# Patient Record
Sex: Female | Born: 1970
Health system: Southern US, Community
[De-identification: ages and names within clinical notes are randomized; demographics above are authoritative.]

## PROBLEM LIST (undated history)

## (undated) DIAGNOSIS — F32A Depression, unspecified: Secondary | ICD-10-CM

## (undated) DIAGNOSIS — D689 Coagulation defect, unspecified: Secondary | ICD-10-CM

## (undated) DIAGNOSIS — C801 Malignant (primary) neoplasm, unspecified: Secondary | ICD-10-CM

## (undated) DIAGNOSIS — R768 Other specified abnormal immunological findings in serum: Secondary | ICD-10-CM

## (undated) DIAGNOSIS — D219 Benign neoplasm of connective and other soft tissue, unspecified: Secondary | ICD-10-CM

## (undated) DIAGNOSIS — E785 Hyperlipidemia, unspecified: Secondary | ICD-10-CM

## (undated) DIAGNOSIS — Z923 Personal history of irradiation: Secondary | ICD-10-CM

## (undated) DIAGNOSIS — F329 Major depressive disorder, single episode, unspecified: Secondary | ICD-10-CM

## (undated) DIAGNOSIS — F4310 Post-traumatic stress disorder, unspecified: Secondary | ICD-10-CM

## (undated) DIAGNOSIS — Z8659 Personal history of other mental and behavioral disorders: Secondary | ICD-10-CM

## (undated) HISTORY — DX: Coagulation defect, unspecified: D68.9

## (undated) HISTORY — DX: Malignant (primary) neoplasm, unspecified: C80.1

## (undated) HISTORY — DX: Post-traumatic stress disorder, unspecified: F43.10

## (undated) HISTORY — DX: Benign neoplasm of connective and other soft tissue, unspecified: D21.9

## (undated) HISTORY — DX: Personal history of other mental and behavioral disorders: Z86.59

## (undated) HISTORY — DX: Depression, unspecified: F32.A

## (undated) HISTORY — DX: Major depressive disorder, single episode, unspecified: F32.9

## (undated) HISTORY — PX: OTHER SURGICAL HISTORY: SHX169

## (undated) HISTORY — DX: Hyperlipidemia, unspecified: E78.5

## (undated) HISTORY — DX: Other specified abnormal immunological findings in serum: R76.8

---
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
## 1998-08-18 ENCOUNTER — Other Ambulatory Visit: Admission: RE | Admit: 1998-08-18 | Discharge: 1998-08-18 | Payer: Self-pay | Admitting: *Deleted

## 2000-12-26 ENCOUNTER — Emergency Department (HOSPITAL_COMMUNITY): Admission: EM | Admit: 2000-12-26 | Discharge: 2000-12-27 | Payer: Self-pay | Admitting: Emergency Medicine

## 2004-08-19 ENCOUNTER — Emergency Department (HOSPITAL_COMMUNITY): Admission: EM | Admit: 2004-08-19 | Discharge: 2004-08-19 | Payer: Self-pay | Admitting: Emergency Medicine

## 2005-10-11 ENCOUNTER — Other Ambulatory Visit: Admission: RE | Admit: 2005-10-11 | Discharge: 2005-10-11 | Payer: Self-pay | Admitting: Obstetrics & Gynecology

## 2005-10-13 ENCOUNTER — Inpatient Hospital Stay (HOSPITAL_COMMUNITY): Admission: AD | Admit: 2005-10-13 | Discharge: 2005-10-13 | Payer: Self-pay | Admitting: Obstetrics and Gynecology

## 2005-10-13 IMAGING — US US OB COMP LESS 14 WK
1 series · 14 of 28 positions shown · non-contrast
Comparison: none

CLINICAL DATA: Pain with bleeding.  
 OBSTETRICAL ULTRASOUND <14 WKS:
 Number of Fetuses:  1
 Heart Rate:  147
 Presentation:  Oblique/variable
 Placental location:  Posterior
 CRL:  4.6 cm   11 w 3 d 
 Ultrasound EDC: [DATE]

 Fetal anatomy could not be evaluated due to the early gestational age.  
 MATERNAL UTERINE AND ADNEXAL FINDINGS
 Cervix not evaluated.  Normal ovaries.

[Series 1: us ob comp less 14 wk · 0.31mm/px · 14 of 37 slices shown]
[im 2/37]
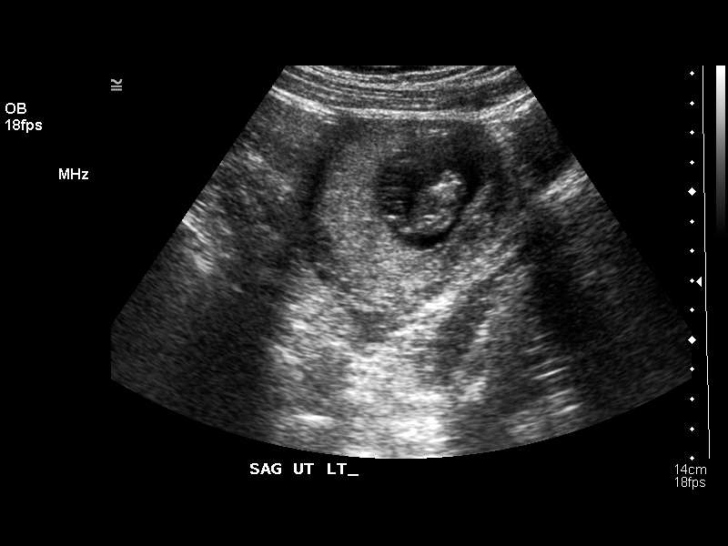
[im 5/37]
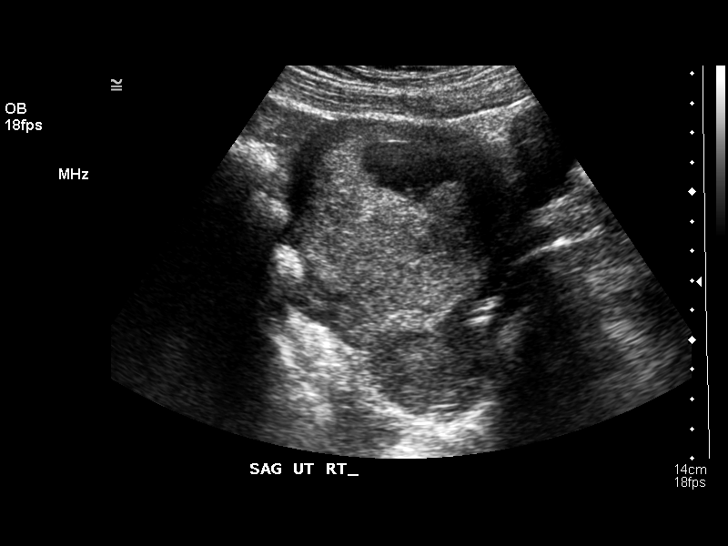
[im 7/37]
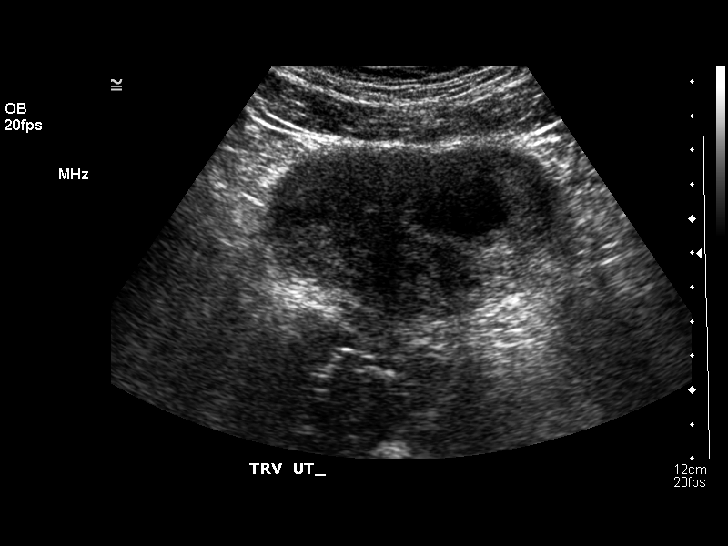
[im 10/37]
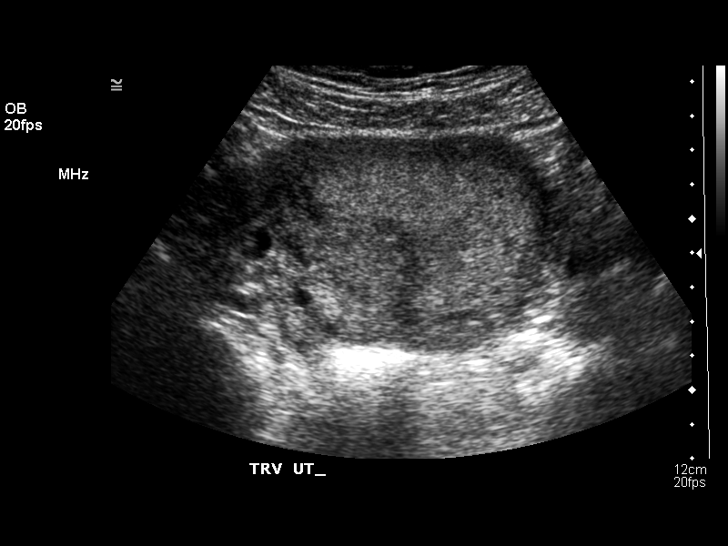
[im 13/37]
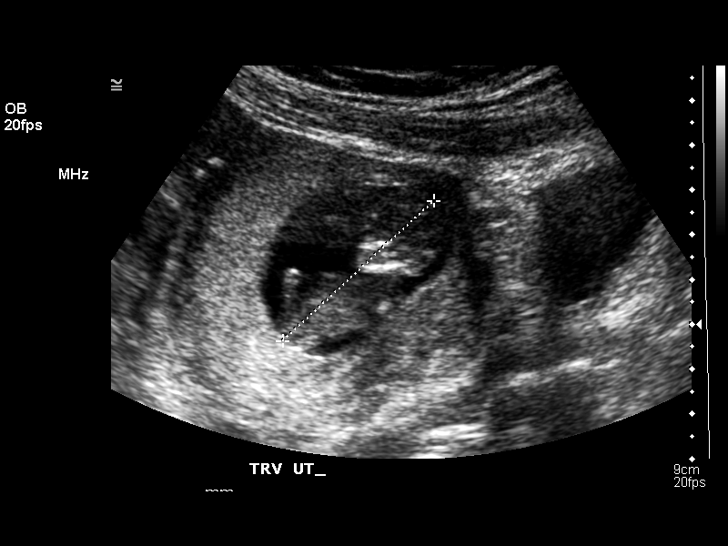
[im 15/37]
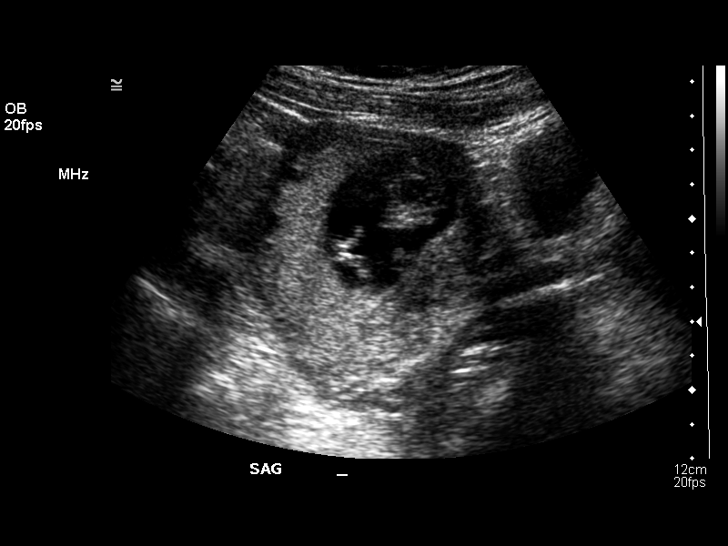
[im 18/37]
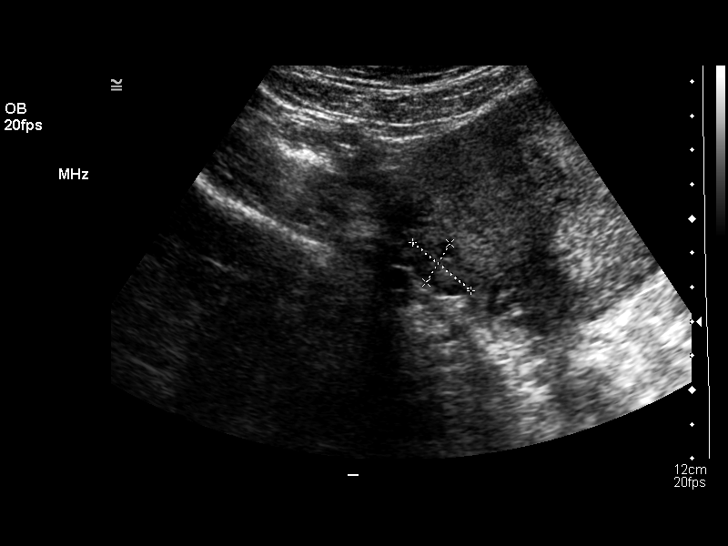
[im 21/37]
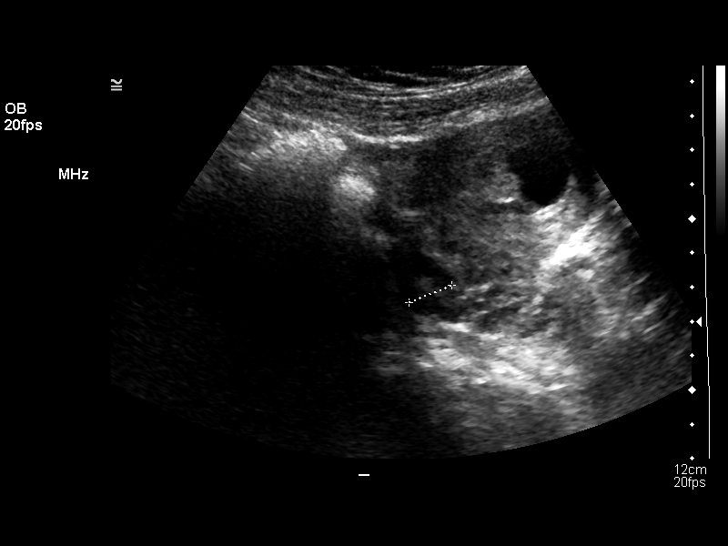
[im 23/37]
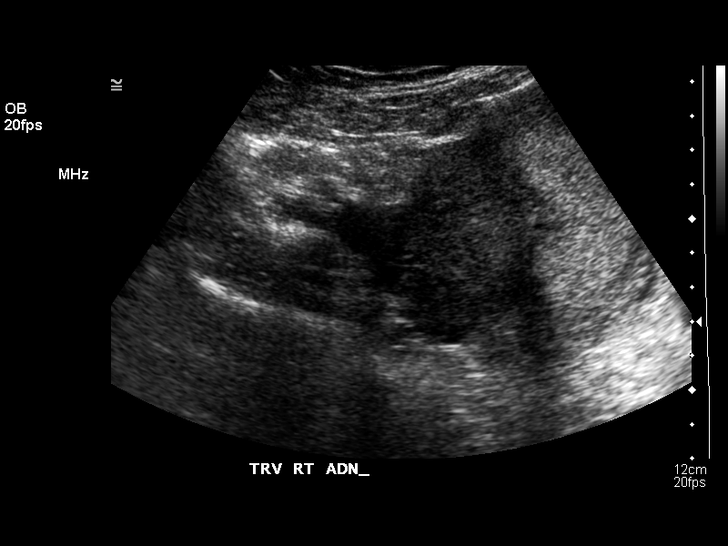
[im 26/37]
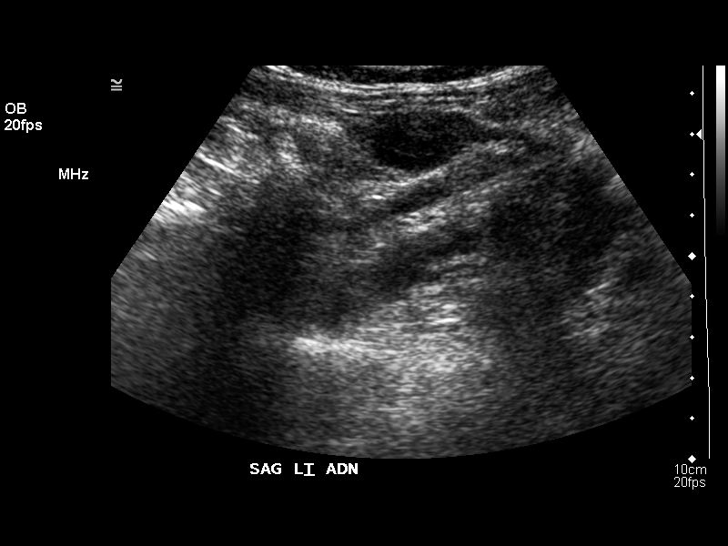
[im 29/37]
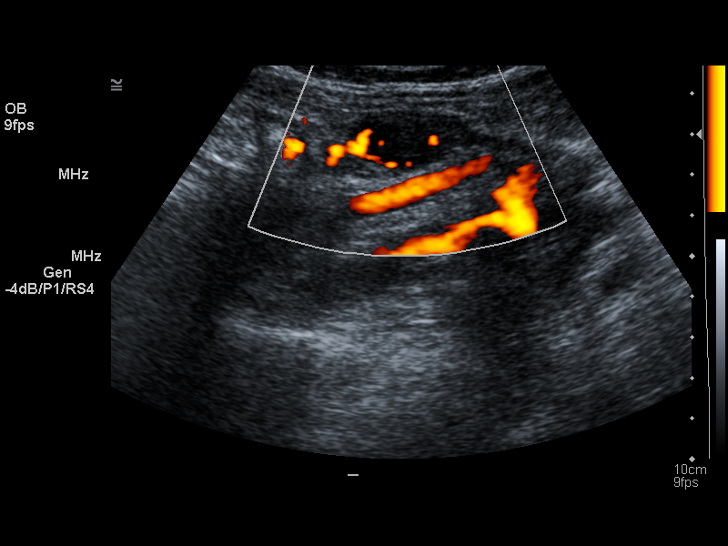
[im 31/37]
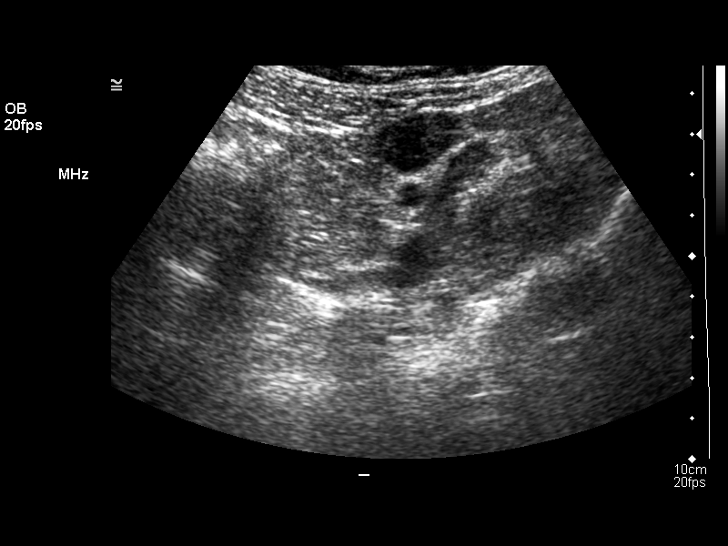
[im 34/37]
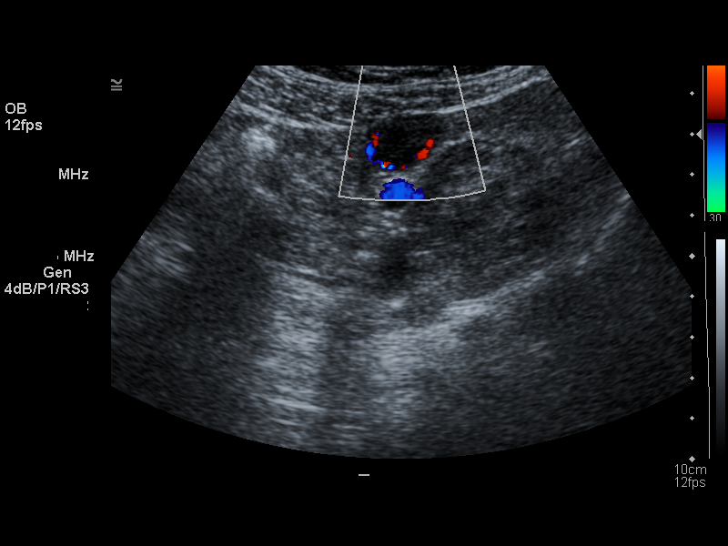
[im 37/37]
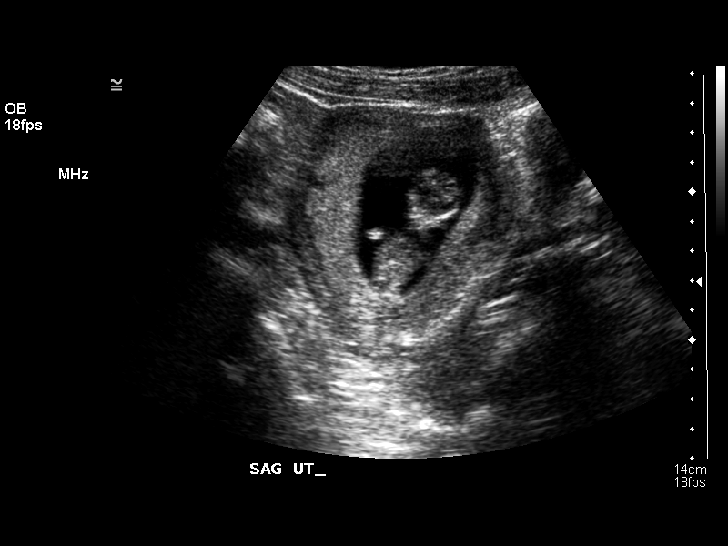

[14 of 28 positions shown; findings below may reference images not displayed]

IMPRESSION: 1.  Single living intrauterine gestation at estimated 11 week 3 day gestational age by crown rump length.  There is no evidence for placenta previa although placental abruption can be occult at ultrasound.  
 2.  Dedicated fetal anatomic assessment at 18 weeks is recommended.

## 2006-02-04 ENCOUNTER — Ambulatory Visit (HOSPITAL_COMMUNITY): Admission: RE | Admit: 2006-02-04 | Discharge: 2006-02-04 | Payer: Self-pay | Admitting: Obstetrics & Gynecology

## 2006-05-05 ENCOUNTER — Inpatient Hospital Stay (HOSPITAL_COMMUNITY): Admission: AD | Admit: 2006-05-05 | Discharge: 2006-05-10 | Payer: Self-pay | Admitting: Obstetrics & Gynecology

## 2006-05-06 ENCOUNTER — Encounter (INDEPENDENT_AMBULATORY_CARE_PROVIDER_SITE_OTHER): Payer: Self-pay | Admitting: Specialist

## 2006-05-11 ENCOUNTER — Encounter: Admission: RE | Admit: 2006-05-11 | Discharge: 2006-06-10 | Payer: Self-pay | Admitting: Obstetrics and Gynecology

## 2006-06-11 ENCOUNTER — Encounter: Admission: RE | Admit: 2006-06-11 | Discharge: 2006-07-11 | Payer: Self-pay | Admitting: Obstetrics and Gynecology

## 2006-07-12 ENCOUNTER — Encounter: Admission: RE | Admit: 2006-07-12 | Discharge: 2006-08-10 | Payer: Self-pay | Admitting: Obstetrics and Gynecology

## 2006-08-11 ENCOUNTER — Encounter: Admission: RE | Admit: 2006-08-11 | Discharge: 2006-09-10 | Payer: Self-pay | Admitting: Obstetrics and Gynecology

## 2006-09-11 ENCOUNTER — Encounter: Admission: RE | Admit: 2006-09-11 | Discharge: 2006-10-10 | Payer: Self-pay | Admitting: Obstetrics and Gynecology

## 2006-10-11 ENCOUNTER — Encounter: Admission: RE | Admit: 2006-10-11 | Discharge: 2006-11-10 | Payer: Self-pay | Admitting: Obstetrics and Gynecology

## 2006-11-11 ENCOUNTER — Encounter: Admission: RE | Admit: 2006-11-11 | Discharge: 2006-12-11 | Payer: Self-pay | Admitting: Obstetrics and Gynecology

## 2006-12-12 ENCOUNTER — Encounter: Admission: RE | Admit: 2006-12-12 | Discharge: 2007-01-09 | Payer: Self-pay | Admitting: Obstetrics and Gynecology

## 2007-01-10 ENCOUNTER — Encounter: Admission: RE | Admit: 2007-01-10 | Discharge: 2007-02-09 | Payer: Self-pay | Admitting: Obstetrics and Gynecology

## 2007-02-10 ENCOUNTER — Encounter: Admission: RE | Admit: 2007-02-10 | Discharge: 2007-03-11 | Payer: Self-pay | Admitting: Obstetrics and Gynecology

## 2007-03-12 ENCOUNTER — Encounter: Admission: RE | Admit: 2007-03-12 | Discharge: 2007-03-23 | Payer: Self-pay | Admitting: Obstetrics and Gynecology

## 2007-11-09 DIAGNOSIS — Z8659 Personal history of other mental and behavioral disorders: Secondary | ICD-10-CM

## 2007-11-09 HISTORY — DX: Personal history of other mental and behavioral disorders: Z86.59

## 2009-02-11 ENCOUNTER — Emergency Department (HOSPITAL_COMMUNITY): Admission: EM | Admit: 2009-02-11 | Discharge: 2009-02-11 | Payer: Self-pay | Admitting: Family Medicine
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
## 2009-09-24 ENCOUNTER — Emergency Department (HOSPITAL_COMMUNITY): Admission: EM | Admit: 2009-09-24 | Discharge: 2009-09-24 | Payer: Self-pay | Admitting: Emergency Medicine

## 2009-09-24 IMAGING — CR DG ANKLE COMPLETE 3+V*L*
3 series · 3 of 3 positions shown · non-contrast
Comparison: None.

CLINICAL DATA: Fall on left ankle pain

LEFT ANKLE COMPLETE - 3+ VIEW

[t ankle joint ap left]
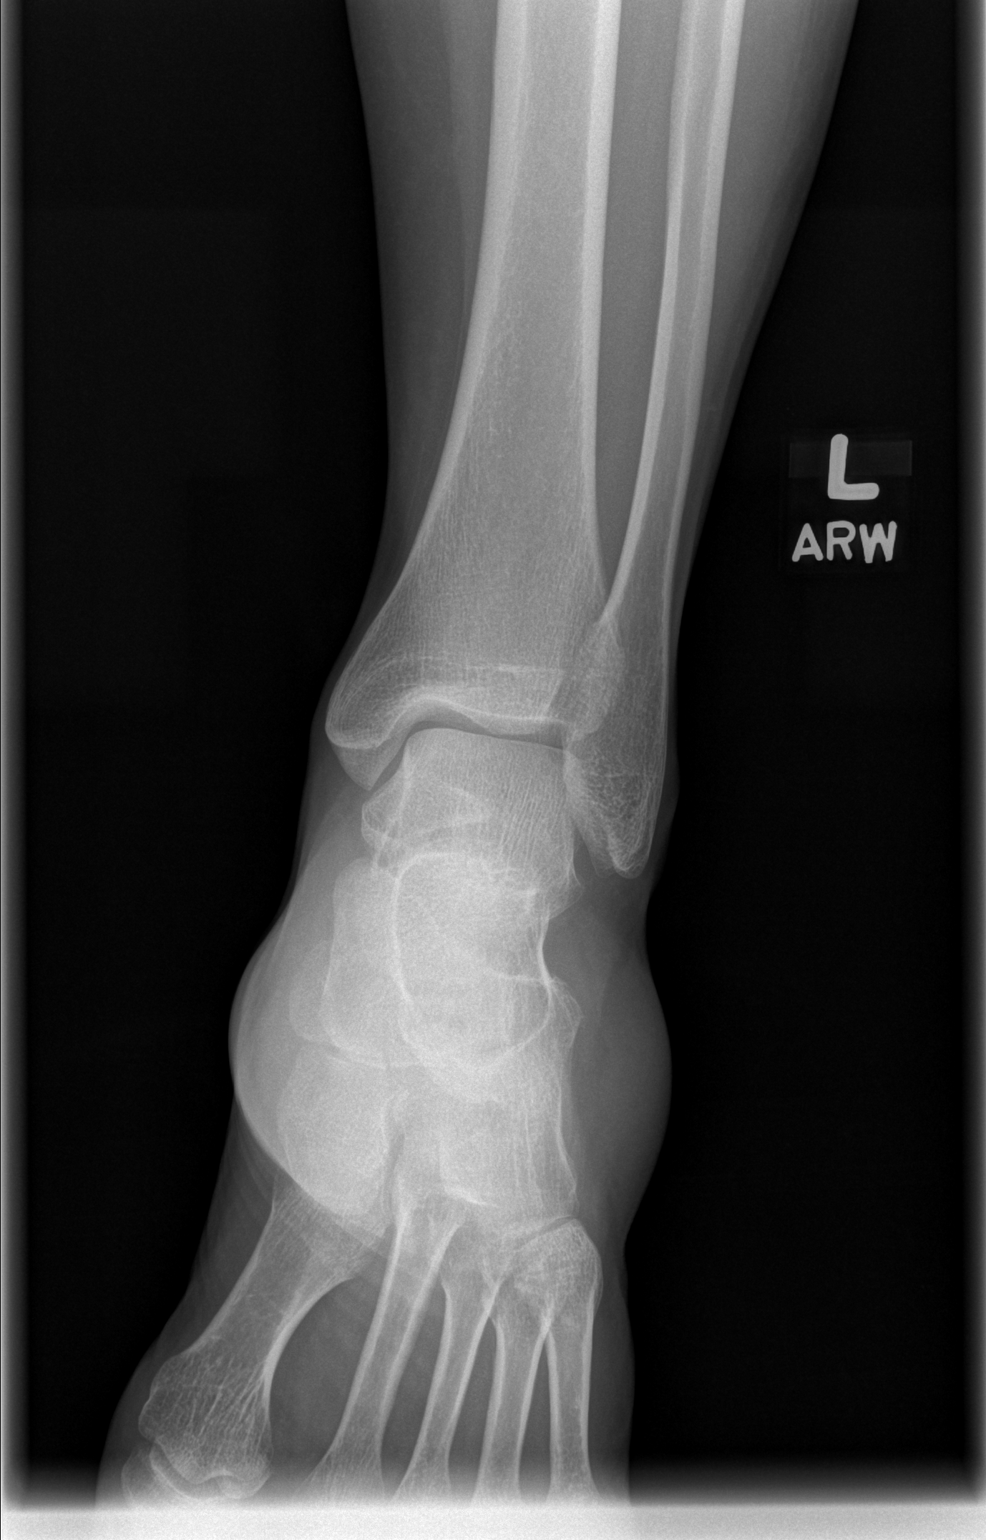

[t ankle joint oblique left]
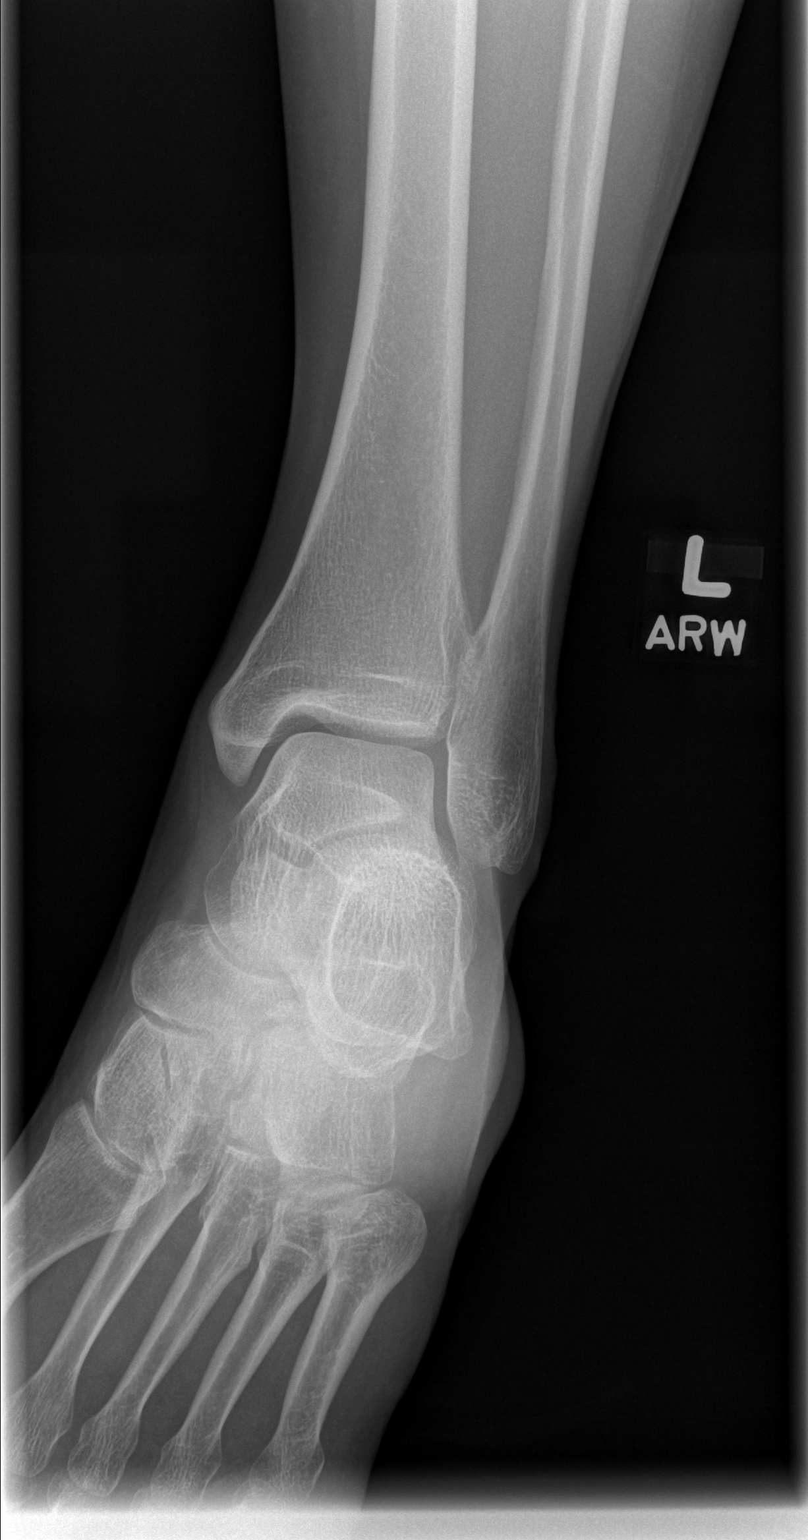

[t ankle joint lat left]
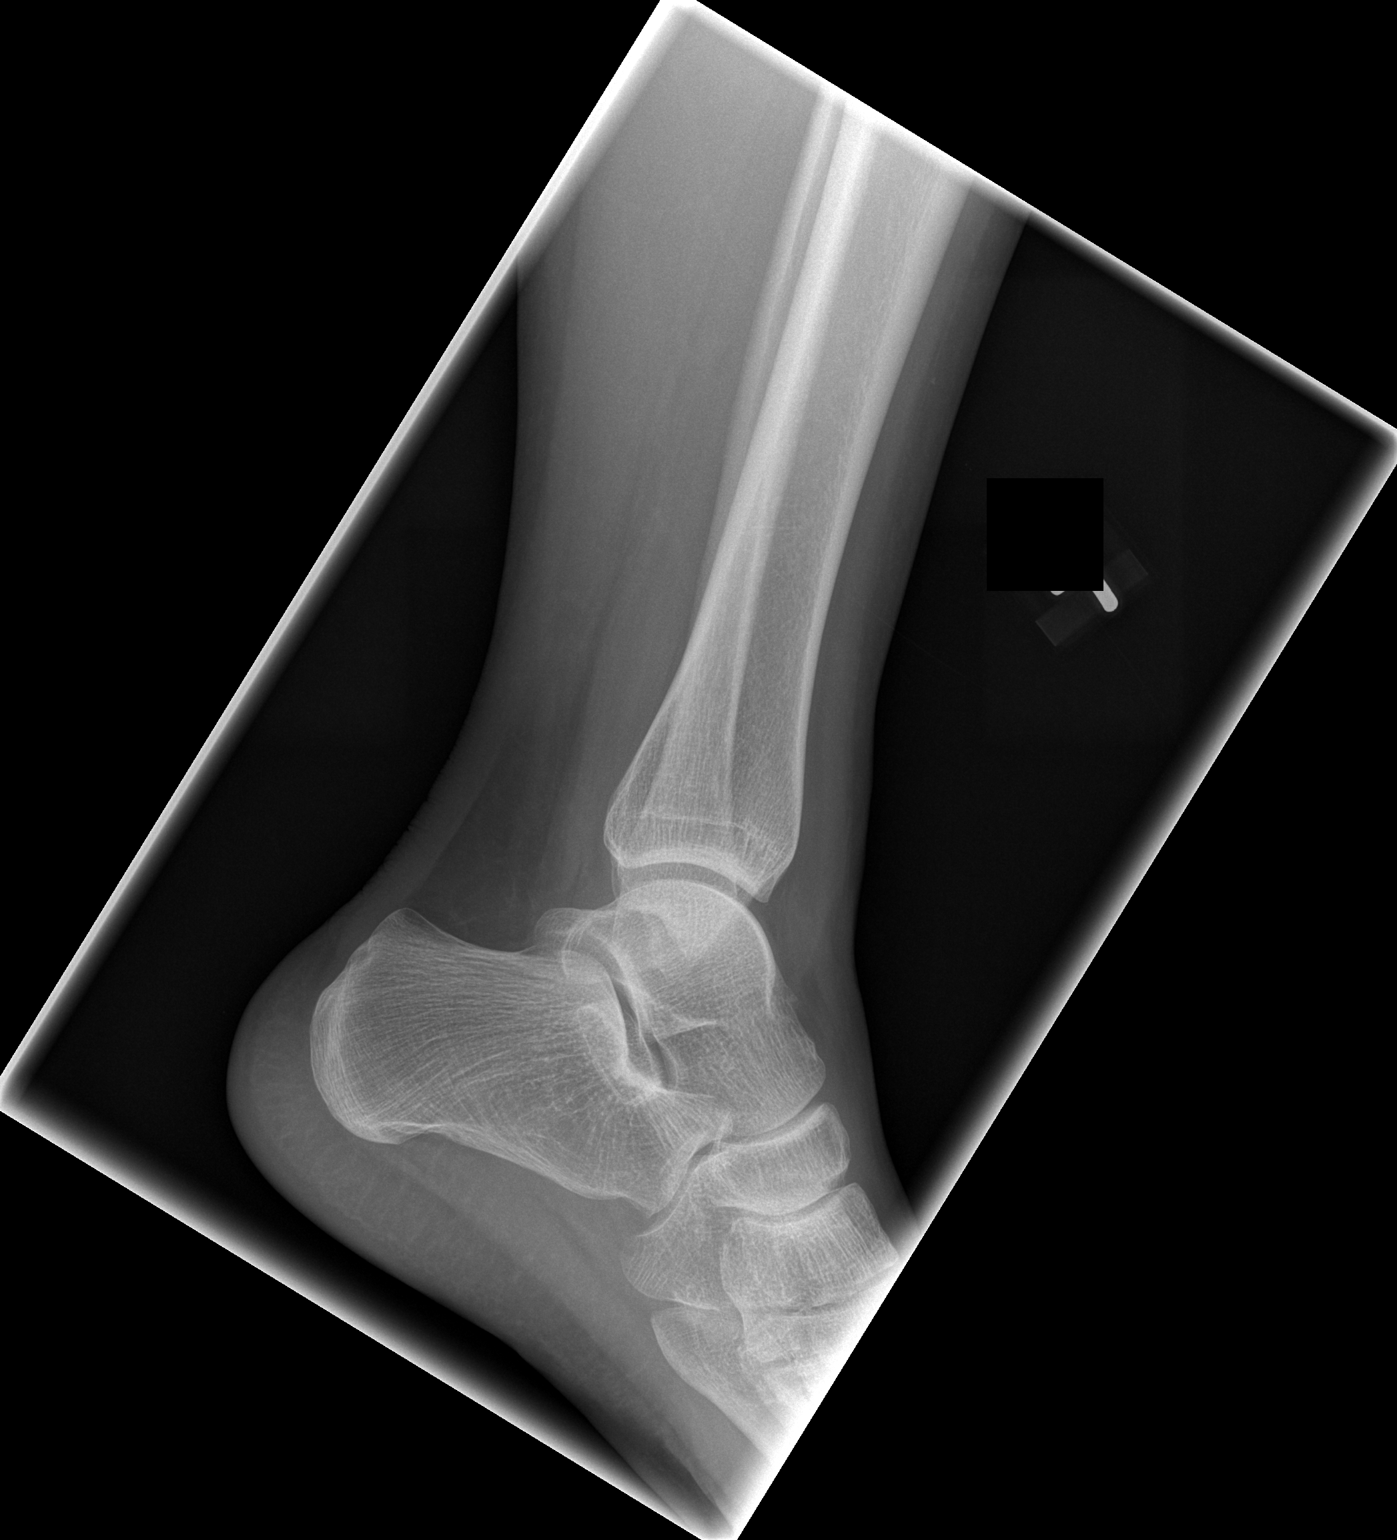

[3 of 3 positions shown; findings below may reference images not displayed]

FINDINGS: Three-view exam shows no fracture subluxation.  No
worrisome lytic or sclerotic osseous lesion.
IMPRESSION: No acute bony abnormality.

## 2010-06-02 ENCOUNTER — Emergency Department (HOSPITAL_COMMUNITY): Admission: EM | Admit: 2010-06-02 | Discharge: 2010-06-02 | Payer: Self-pay | Admitting: Emergency Medicine

## 2011-02-17 LAB — URINALYSIS, DIPSTICK ONLY
Bilirubin Urine: NEGATIVE
Glucose, UA: NEGATIVE mg/dL
Hgb urine dipstick: NEGATIVE
Ketones, ur: NEGATIVE mg/dL
Nitrite: NEGATIVE
Protein, ur: NEGATIVE mg/dL
Specific Gravity, Urine: 1.013 (ref 1.005–1.030)
Urobilinogen, UA: 0.2 mg/dL (ref 0.0–1.0)
pH: 6 (ref 5.0–8.0)

## 2011-03-26 NOTE — Op Note (Signed)
NAME:  Julie Atkinson, Julie Atkinson                 ACCOUNT NO.:  [id number]   MEDICAL RECORD NO.:  [id number]            PATIENT TYPE:   LOCATION:                                 FACILITY:   PHYSICIAN:  Randye Lobo, M.D.        DATE OF BIRTH:   DATE OF PROCEDURE:  05/07/2006  DATE OF DISCHARGE:                                 OPERATIVE REPORT   PREOPERATIVE DIAGNOSIS:  1..  Intrauterine gestation at 40+5 weeks.  2..  Arrest of dilatation.  1.  Meconium stained amniotic fluid.   POSTOPERATIVE DIAGNOSIS:  1..  Intrauterine gestation at 40+5 weeks.  2..  Arrest of dilatation.  3..  Fetal macrosomia.  1.  Meconium stained amniotic fluid.   PROCEDURE:  Primary low segment transverse cesarean section.   SURGEON:  Randye Lobo, M.D.   ASSISTANT:  Marc Morgans. Mayford Knife, M.D.   ANESTHESIA:  Epidural.   IV FLUIDS:  2700 mL Ringer's lactate.   ESTIMATED BLOOD LOSS:  800 mL   URINE OUTPUT:  300 mL   COMPLICATIONS:  None.   INDICATIONS FOR THE PROCEDURE:  The patient is a 35-year gravida 4, para 0-0-  3-0 Caucasian female who presented at 40+3 weeks' gestation on 05/05/2006  for induction of labor at term.  The patient's prenatal course had been  significant for her advanced maternal age status and she underwent genetic  screening indicating increased risk of Down syndrome.  The patient had a  normal ultrasound and declined amniocentesis.  Upon admission, the patient's  cervix was 1 cm dilated and 50% effaced with the vertex at the -3 station.  The patient did receive Cytotec and the following morning on 05/06/2006,  Pitocin was begun.  Artificial rupture of membranes was performed and  meconium amniotic fluid was noted.   The patient progressed throughout her latent stage of labor.  There were two  attempts to place IUPC during the patient's latent phase of labor.  However,  the IUPC was not functioning well.  The patient ultimately had arrest of  dilatation at 6 cm.  The cervix was noted to  become swollen and there was  caput forming in the vertex, did not descend below the 0 station.  The  position was noted to be transverse and the vertex could not be rotated  manually.  The fetal heart rate tracing throughout the patient's labor  remained overall reactive and reassuring.  The patient was given a diagnosis  of failure to progress and a plan was made to proceed with a primary low  segment transverse cesarean section after risks, benefits, and alternatives  were discussed with her.   FINDINGS:  A viable female was delivered at 2237 with Apgars of 6 at one  minute and 9 at five minutes.  The vertex was noted to be right occiput  transverse.  The weight was later noted to be 9 pounds 9 ounces.  The  amniotic fluid was meconium stained.  The placenta had a normal  configuration of a 3-vessel cord.  The uterus, tubes and ovaries  were  normal.   DESCRIPTION OF PROCEDURE:  The patient was escorted from her labor and  delivery suite down to the operating room where her epidural was dosed for  surgical anesthesia.  The patient previously had a Foley catheter placed.  The patient's abdomen was sterilely prepped and draped.   A Pfannenstiel incision was created sharply with the scalpel.  This was  carried down to the fascia using monopolar cautery.  The fascia was then  incised in the midline and the incision was extended bilaterally with the  Mayo scissors.  The rectus muscles were dissected off of the fascia  superiorly and inferiorly and the rectus muscles was then sharply divided in  the midline.  The parietal peritoneum was elevated with 2 hemostat clamps  and entered with the Metzenbaum scissors.  The peritoneal incision was  extended cranially and caudally.   The lower uterine segment was exposed and the bladder flap was sharply  created.  A transverse lower uterine segment incision was then created with  a scalpel.  The uterine cavity was entered bluntly.  The uterine  incision  was extended bilaterally in an upward fashion using the bandage scissors.  A  hand was inserted through the uterine incision and the vertex was delivered  followed by the shoulders and the remainder of the infant.  DeLee suctioning  was performed.  The cord was doubly clamped and cut, and the newborn was  carried over to the waiting pediatricians.   The patient received Ancef 1 gram IV.  The placenta was manually extracted  and sent to pathology.  The patient did receive Pitocin 20 units IV.  The  uterine cavity was wiped clean with a moistened lap pad.  The uterine  incision was closed with a double layer closure of #1 chromic.  The first  was a running locked layer and the second was an imbricating layer.  There  was still some bleeding along the right midportion of the uterine incision  and a figure-of-eight and then a simple suture of #1 chromic were used to  create hemostasis.   The peritoneal cavity was irrigated and suctioned and the uterine incision  was found to be hemostatic.  The abdomen was therefore closed.  The parietal  peritoneum was closed with a running suture of 3-0 Vicryl.  The rectus  muscles were reapproximated in the midline with interrupted sutures of #1  chromic.  The fascia was closed with a running suture of #0 Vicryl.  The  subcutaneous layer was irrigated and suctioned and made hemostatic with  monopolar cautery.  The skin was closed with staples and a sterile bandage  was placed over this.   This concluded the patient's surgery.  There were no complications.  All  needle, instrument, and sponge counts were correct.      Randye Lobo, M.D.  Electronically Signed     BES/MEDQ  D:  05/07/2006  T:  05/07/2006  Job:  [id number]
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
## 2012-03-10 ENCOUNTER — Emergency Department (HOSPITAL_COMMUNITY)
Admission: EM | Admit: 2012-03-10 | Discharge: 2012-03-10 | Disposition: A | Discharge status: Home / Self Care | Payer: Worker's Compensation | Attending: Emergency Medicine | Admitting: Emergency Medicine

## 2012-03-10 ENCOUNTER — Encounter (HOSPITAL_COMMUNITY): Payer: Self-pay | Admitting: *Deleted

## 2012-03-10 ENCOUNTER — Emergency Department (HOSPITAL_COMMUNITY): Payer: Worker's Compensation

## 2012-03-10 DIAGNOSIS — Y92410 Unspecified street and highway as the place of occurrence of the external cause: Secondary | ICD-10-CM | POA: Insufficient documentation

## 2012-03-10 DIAGNOSIS — S39012A Strain of muscle, fascia and tendon of lower back, initial encounter: Secondary | ICD-10-CM

## 2012-03-10 DIAGNOSIS — S161XXA Strain of muscle, fascia and tendon at neck level, initial encounter: Secondary | ICD-10-CM

## 2012-03-10 DIAGNOSIS — S335XXA Sprain of ligaments of lumbar spine, initial encounter: Secondary | ICD-10-CM | POA: Insufficient documentation

## 2012-03-10 DIAGNOSIS — S139XXA Sprain of joints and ligaments of unspecified parts of neck, initial encounter: Secondary | ICD-10-CM | POA: Insufficient documentation
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
## 2012-03-10 MED ORDER — HYDROCODONE-ACETAMINOPHEN 5-325 MG PO TABS: 1.0000 | ORAL_TABLET | Freq: Four times a day (QID) | ORAL | Status: AC | PRN

## 2012-03-10 MED ORDER — IBUPROFEN 800 MG PO TABS: 800.0000 mg | ORAL_TABLET | Freq: Three times a day (TID) | ORAL | Status: AC | PRN

## 2012-03-10 MED ORDER — IBUPROFEN 800 MG PO TABS
800.0000 mg | ORAL_TABLET | Freq: Once | ORAL | Status: AC
  Administered 2012-03-10: 800 mg via ORAL
  Filled 2012-03-10: qty 1

## 2012-03-10 MED ORDER — CYCLOBENZAPRINE HCL 10 MG PO TABS: 10.0000 mg | ORAL_TABLET | Freq: Three times a day (TID) | ORAL | Status: AC | PRN

## 2012-03-10 MED ORDER — HYDROCODONE-ACETAMINOPHEN 5-325 MG PO TABS
1.0000 | ORAL_TABLET | Freq: Once | ORAL | Status: DC
  Filled 2012-03-10: qty 1
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
## 2012-03-11 NOTE — ED Provider Notes (Signed)
Medical screening examination/treatment/procedure(s) were performed by non-physician practitioner and as supervising physician I was immediately available for consultation/collaboration.  Raeford Razor, MD 03/11/12 0000
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
## 2013-08-06 ENCOUNTER — Other Ambulatory Visit: Payer: Self-pay

## 2013-08-06 DIAGNOSIS — Z1231 Encounter for screening mammogram for malignant neoplasm of breast: Secondary | ICD-10-CM

## 2013-08-16 ENCOUNTER — Ambulatory Visit
Admission: RE | Admit: 2013-08-16 | Discharge: 2013-08-16 | Disposition: A | Discharge status: Home / Self Care | Payer: Medicaid Other | Source: Ambulatory Visit

## 2013-08-16 DIAGNOSIS — Z1231 Encounter for screening mammogram for malignant neoplasm of breast: Secondary | ICD-10-CM

## 2013-08-16 IMAGING — MG STANDARD SCREENING - COMBO
8 series · 8 of 24 positions shown · non-contrast
Comparison: None.

CLINICAL DATA: Screening.

EXAM:
DIGITAL SCREENING BILATERAL MAMMOGRAM WITH CAD
DIGITAL BREAST TOMOSYNTHESIS
Digital breast tomosynthesis images are acquired in two projections.
These images are reviewed in combination with the digital mammogram,
confirming the findings below.

[R CC]
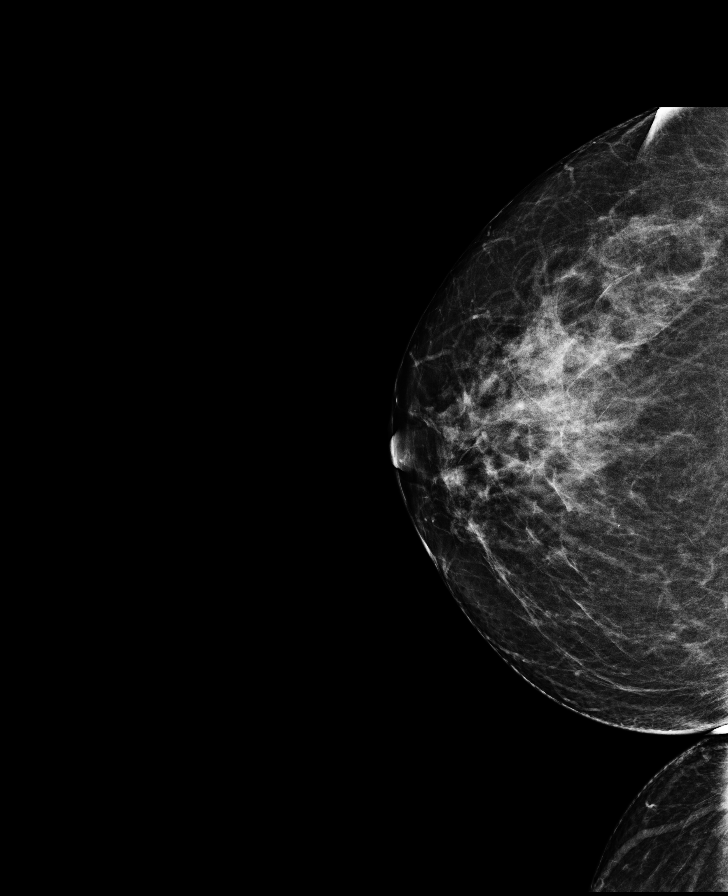

[R MLO]
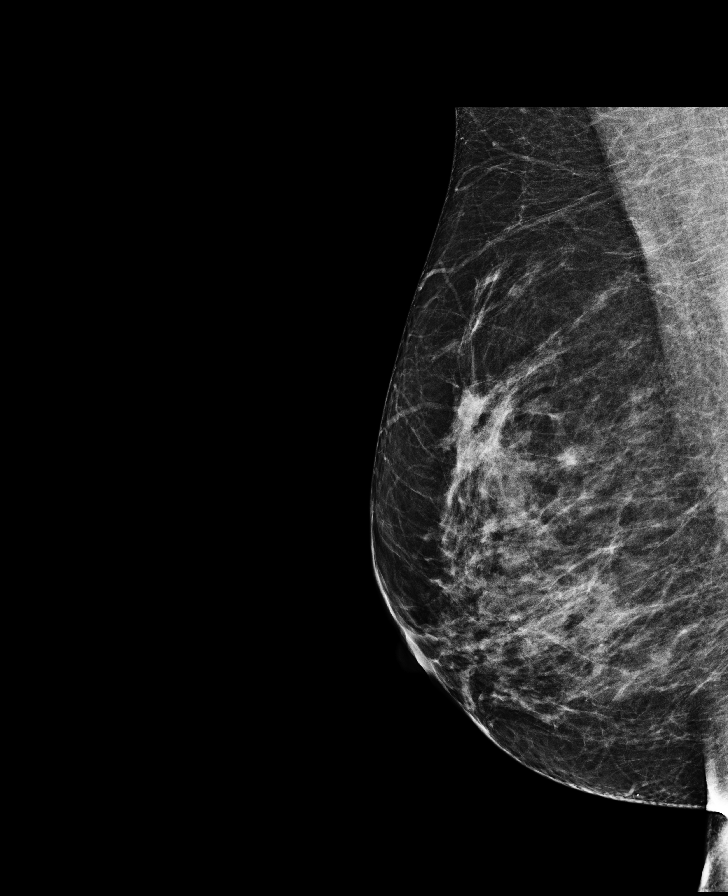

[L CC]
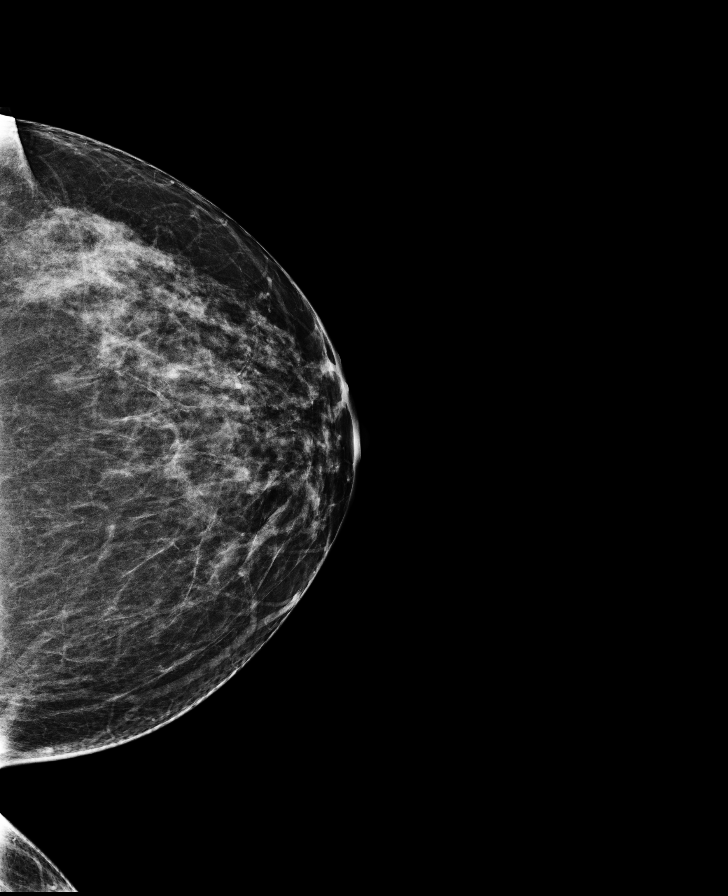

[L MLO]
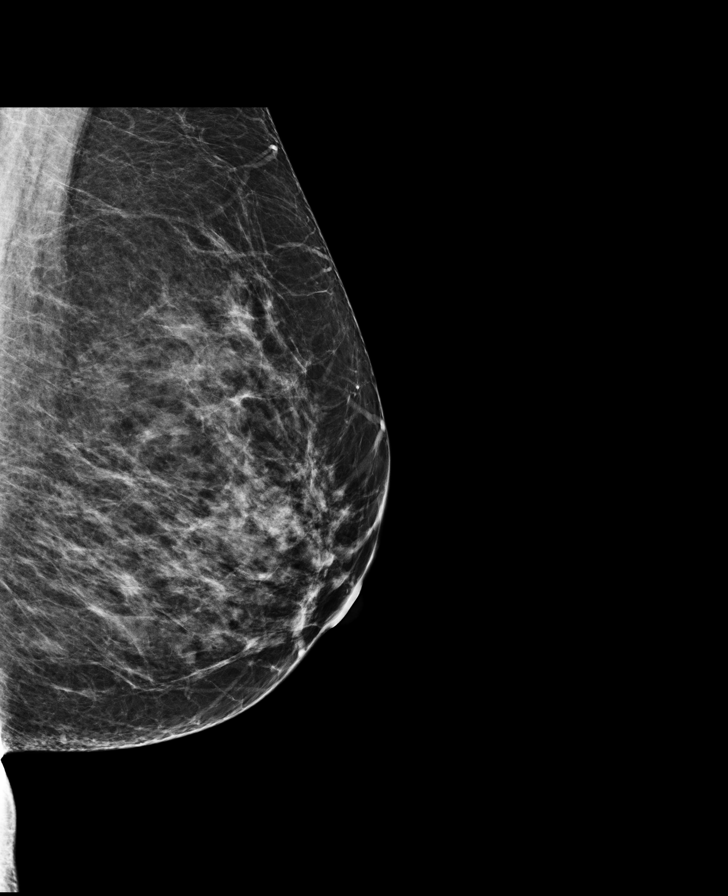

[L MLO tomo · tomo slice 39/78.0]
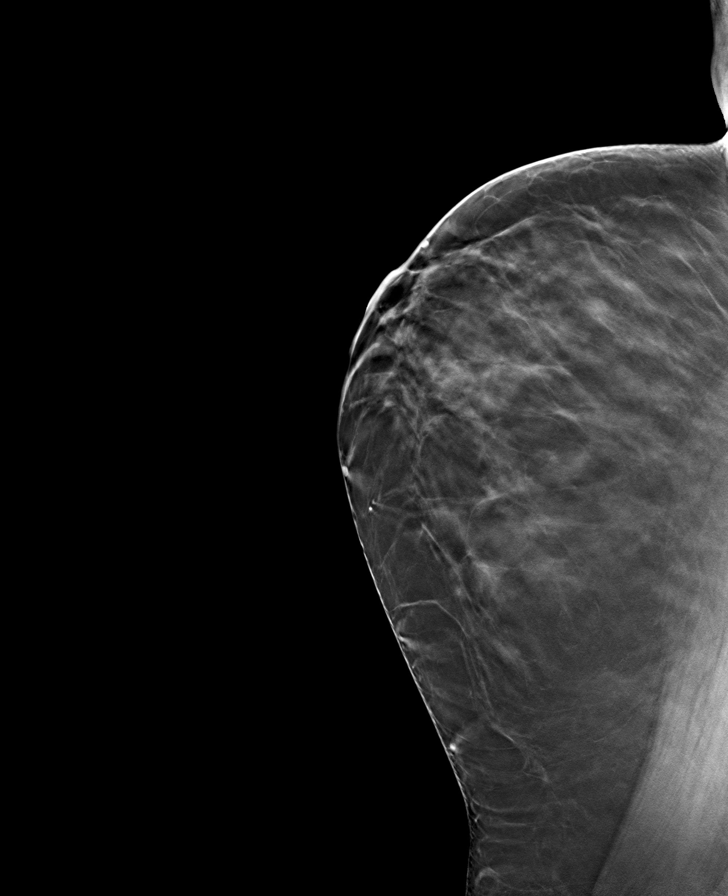

[L CC tomo · tomo slice 45/89.0]
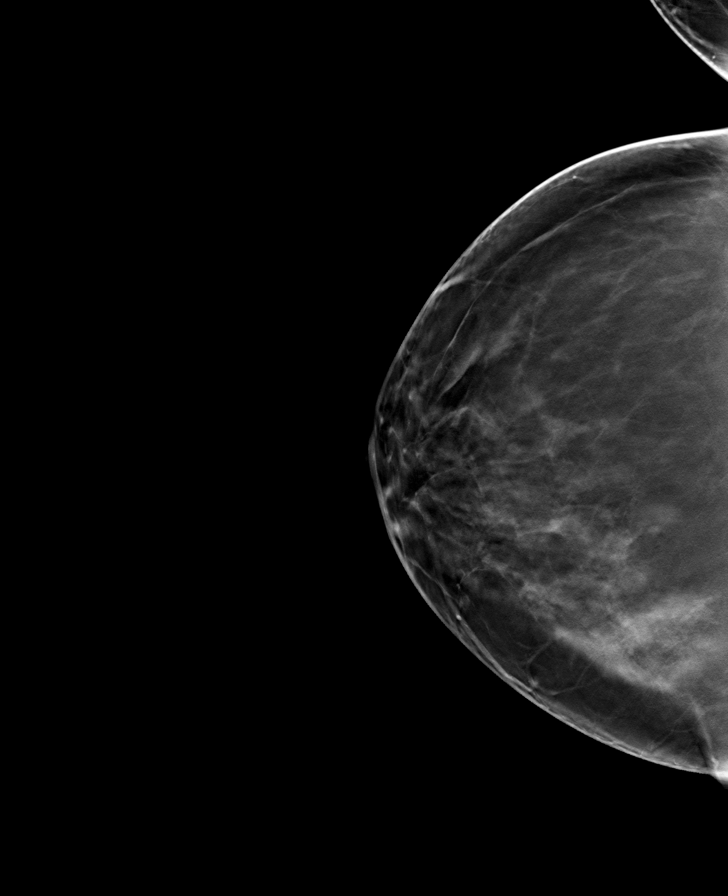

[R MLO tomo · tomo slice 41/82.0]
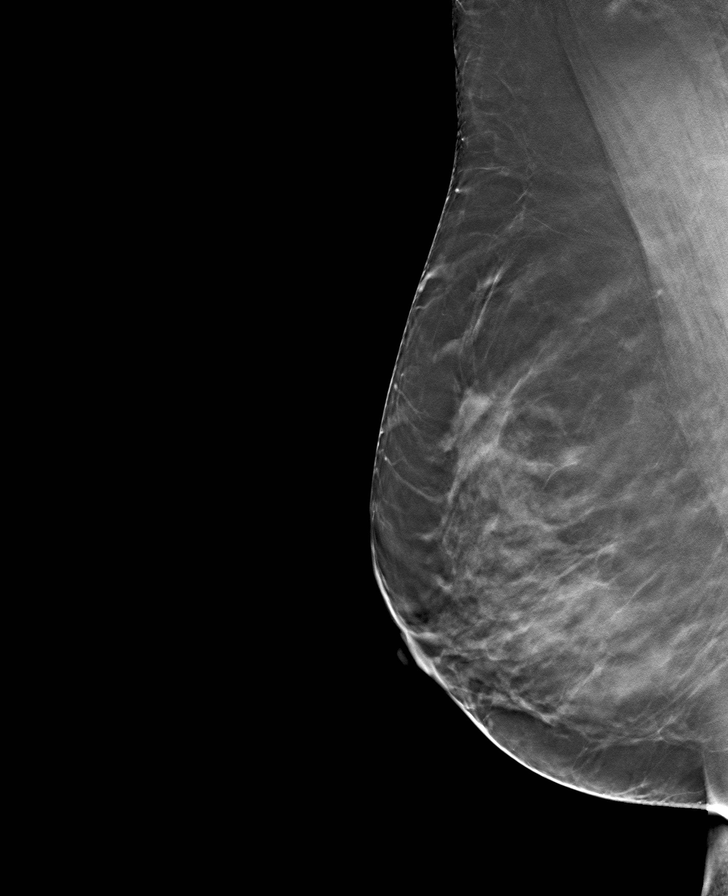

[R CC tomo · tomo slice 48/95.0]
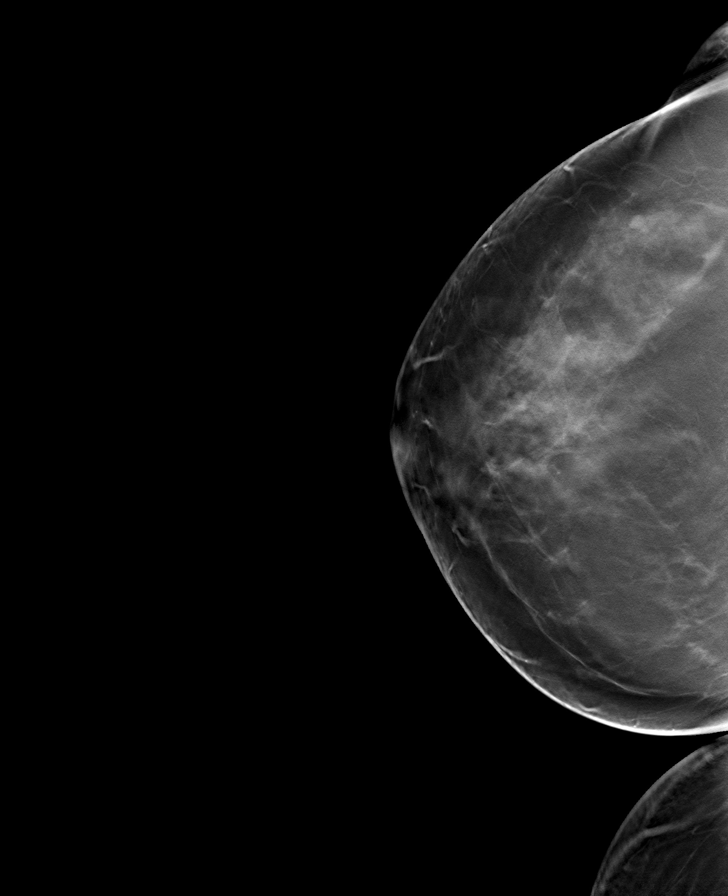

[8 of 24 positions shown; findings below may reference images not displayed]

ACR Breast Density Category c: The breasts are heterogeneously
dense, which may obscure small masses
FINDINGS: There are no findings suspicious for malignancy. Images were
processed with CAD.
IMPRESSION: No mammographic evidence of malignancy. A result letter of this
screening mammogram will be mailed directly to the patient.

RECOMMENDATION:
Screening mammogram in one year. (Code:[5L])

BI-RADS CATEGORY  1: Negative

## 2013-11-08 DIAGNOSIS — D219 Benign neoplasm of connective and other soft tissue, unspecified: Secondary | ICD-10-CM

## 2013-11-08 HISTORY — DX: Benign neoplasm of connective and other soft tissue, unspecified: D21.9

## 2013-12-25 ENCOUNTER — Other Ambulatory Visit: Payer: Self-pay | Admitting: Family Medicine

## 2013-12-25 DIAGNOSIS — R1032 Left lower quadrant pain: Secondary | ICD-10-CM

## 2013-12-25 DIAGNOSIS — R102 Pelvic and perineal pain: Secondary | ICD-10-CM

## 2013-12-27 ENCOUNTER — Ambulatory Visit
Admission: RE | Admit: 2013-12-27 | Discharge: 2013-12-27 | Disposition: A | Discharge status: Home / Self Care | Payer: Medicaid Other | Source: Ambulatory Visit | Attending: Family Medicine | Admitting: Family Medicine

## 2013-12-27 DIAGNOSIS — R1032 Left lower quadrant pain: Secondary | ICD-10-CM

## 2013-12-27 DIAGNOSIS — R102 Pelvic and perineal pain: Secondary | ICD-10-CM

## 2013-12-27 IMAGING — US US PELVIS COMPLETE
1 series · 14 of 25 positions shown · non-contrast
Comparison: None.

CLINICAL DATA: Left-sided pelvic pain.

EXAM:
TRANSABDOMINAL ULTRASOUND OF PELVIS
TECHNIQUE: Transabdominal ultrasound examination of the pelvis was performed
including evaluation of the uterus, ovaries, adnexal regions, and
pelvic cul-de-sac.

[Series 1: us pelvis complete · 0.19mm/px · 14 of 85 slices shown]
[im 1/85]
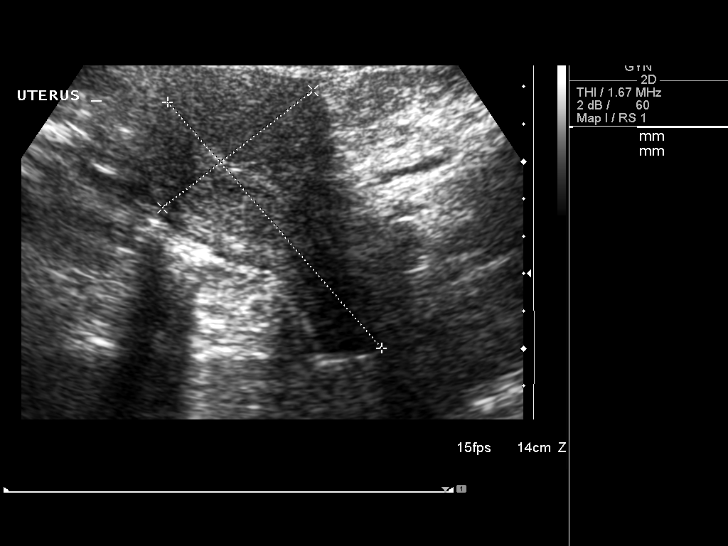
[im 8/85]
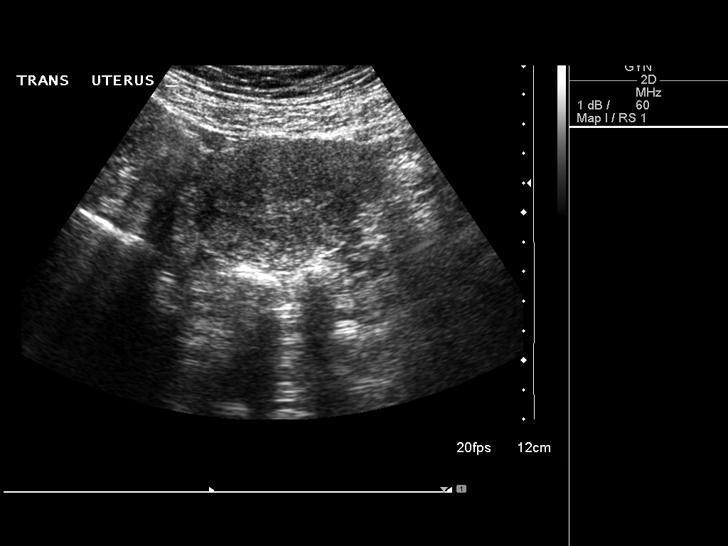
[im 15/85]
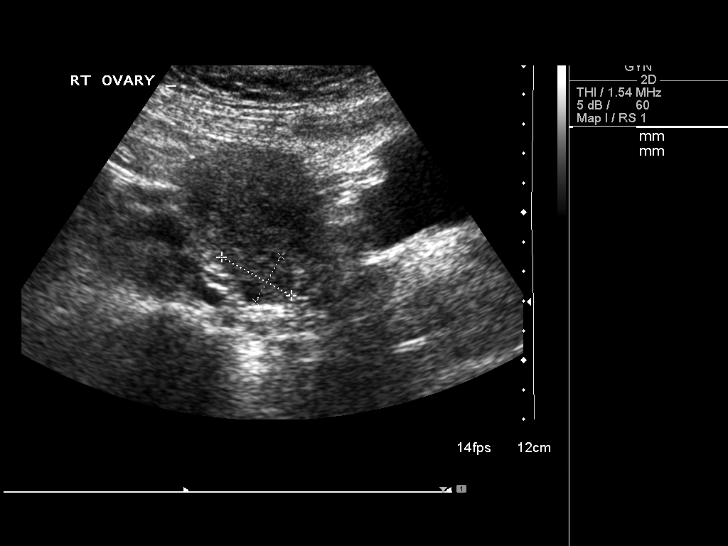
[im 22/85]
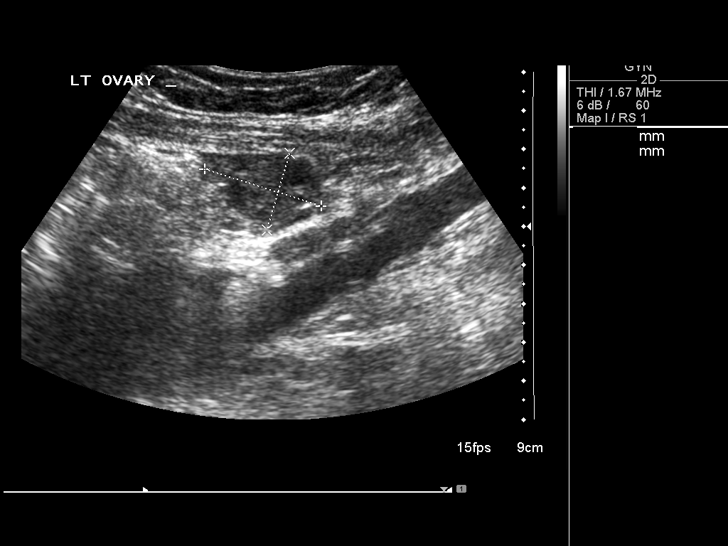
[im 29/85]
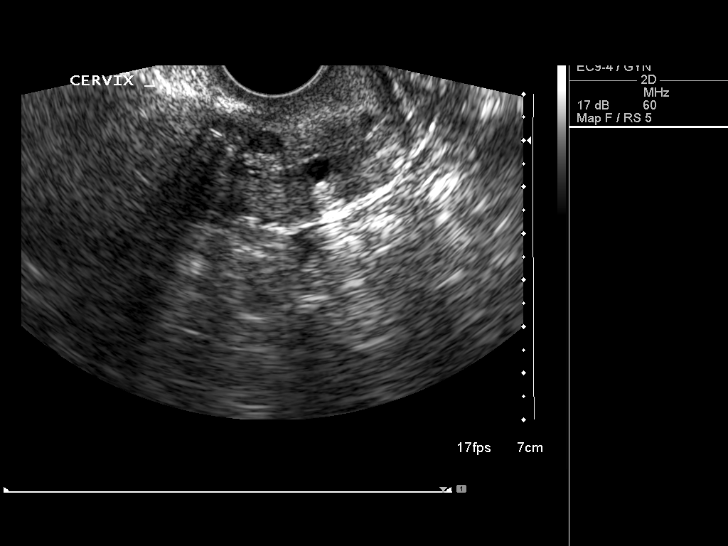
[im 32/85]
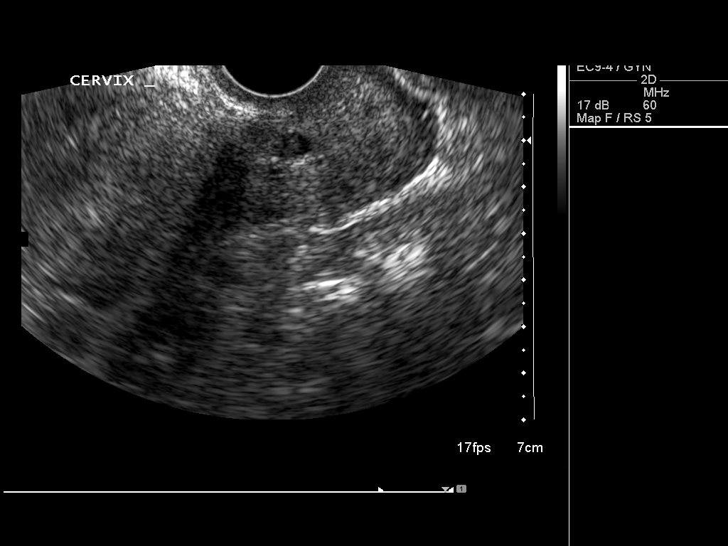
[im 39/85]
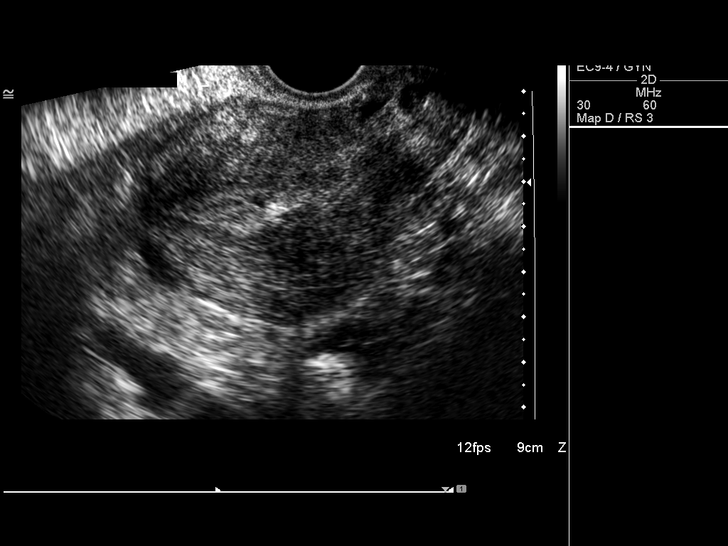
[im 46/85]
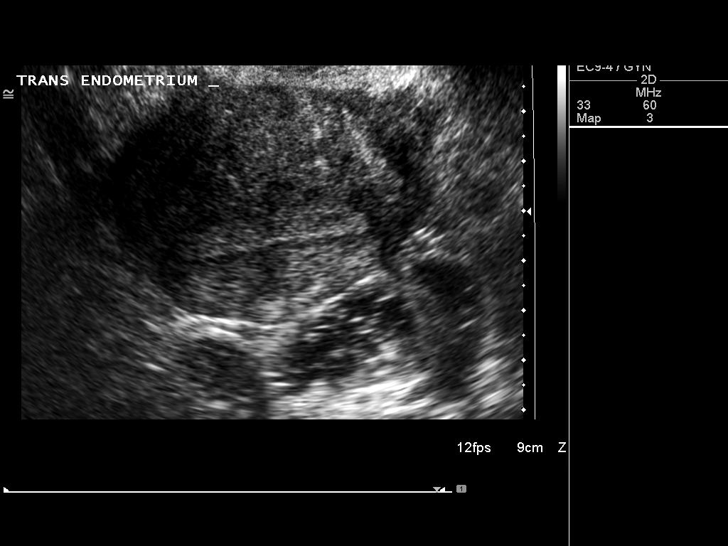
[im 53/85]
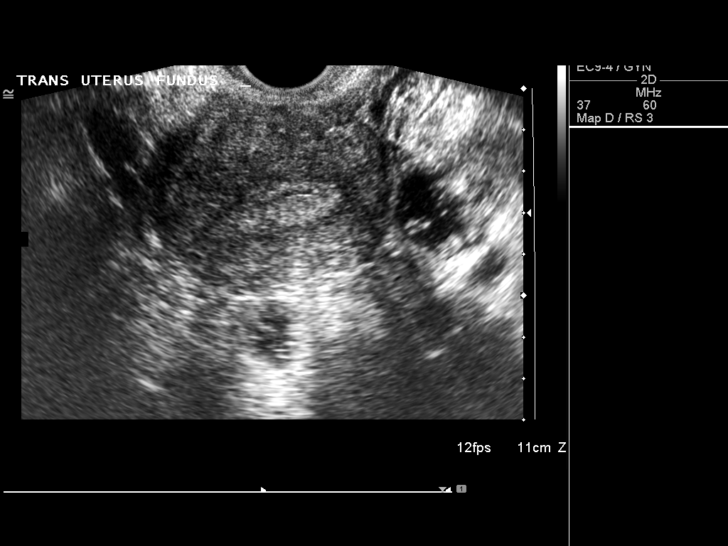
[im 57/85]
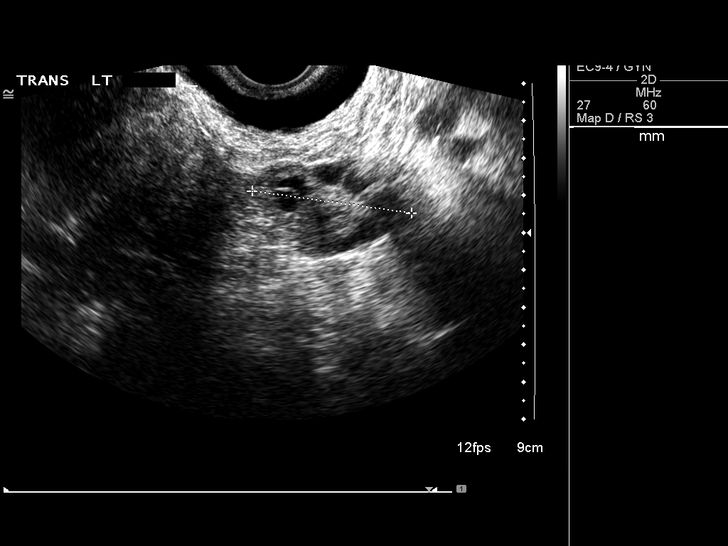
[im 64/85]
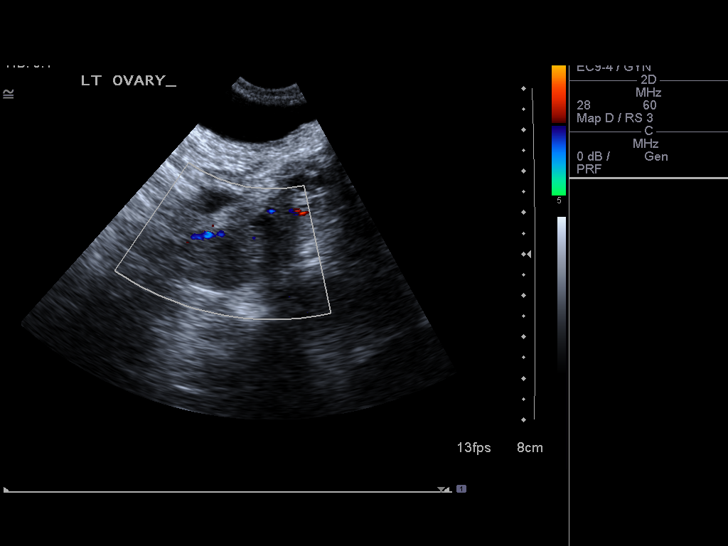
[im 71/85]
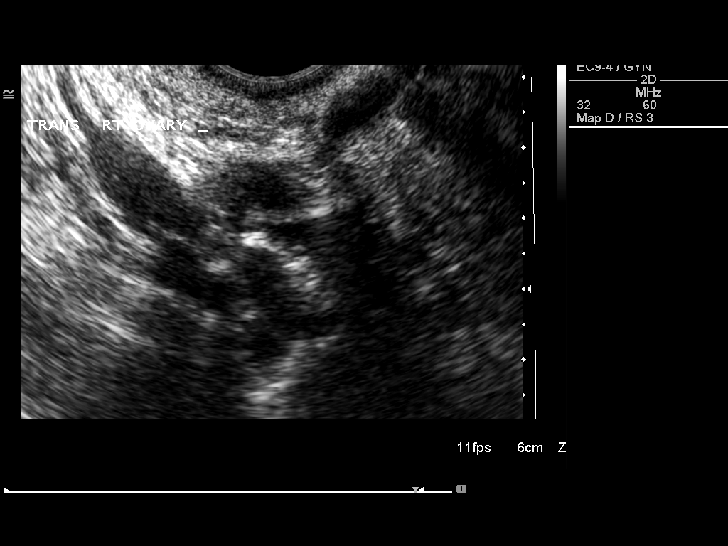
[im 78/85]
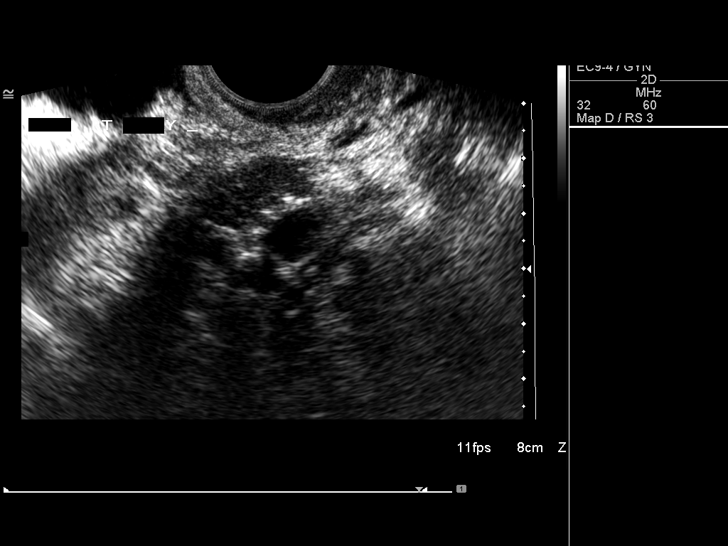
[im 85/85]
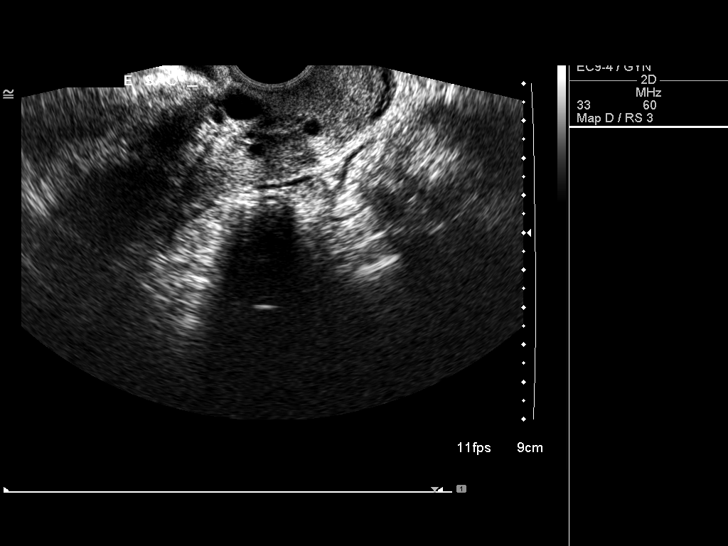

[14 of 25 positions shown; findings below may reference images not displayed]

FINDINGS: Uterus

Measurements: 9.4 x 4.9 x 5.9 cm. Small, 8 mm fibroid noted on the
left side.

Endometrium

Thickness: 8.6 mm.  No focal abnormality visualized.

Right ovary

Measurements: 2.9 x 1.8 x 2.9 cm. Normal appearance/no adnexal mass.

Left ovary

Measurements: 3.0 x 2.3 x 4.3 cm. Normal appearance/no adnexal mass.

Other findings:  No free pelvic fluid collections.
IMPRESSION: Unremarkable pelvic ultrasound examination.

## 2014-01-07 ENCOUNTER — Other Ambulatory Visit: Payer: Self-pay | Admitting: Family Medicine

## 2014-01-07 DIAGNOSIS — R1032 Left lower quadrant pain: Secondary | ICD-10-CM

## 2014-01-14 ENCOUNTER — Other Ambulatory Visit: Payer: Self-pay

## 2014-01-17 ENCOUNTER — Ambulatory Visit: Payer: Self-pay | Admitting: Podiatry

## 2014-01-31 ENCOUNTER — Ambulatory Visit: Payer: Self-pay | Admitting: Podiatry

## 2014-02-21 ENCOUNTER — Ambulatory Visit (INDEPENDENT_AMBULATORY_CARE_PROVIDER_SITE_OTHER): Payer: BC Managed Care – HMO

## 2014-02-21 ENCOUNTER — Encounter: Payer: Self-pay | Admitting: Podiatry

## 2014-02-21 ENCOUNTER — Ambulatory Visit (INDEPENDENT_AMBULATORY_CARE_PROVIDER_SITE_OTHER): Payer: BC Managed Care – HMO | Admitting: Podiatry

## 2014-02-21 ENCOUNTER — Ambulatory Visit: Payer: Self-pay

## 2014-02-21 VITALS — BP 125/82 | HR 78 | Resp 16 | Ht 66.5 in | Wt 194.0 lb

## 2014-02-21 DIAGNOSIS — M79672 Pain in left foot: Secondary | ICD-10-CM

## 2014-02-21 DIAGNOSIS — M79609 Pain in unspecified limb: Secondary | ICD-10-CM

## 2014-02-21 DIAGNOSIS — M722 Plantar fascial fibromatosis: Secondary | ICD-10-CM

## 2014-02-21 DIAGNOSIS — M79671 Pain in right foot: Secondary | ICD-10-CM

## 2014-02-21 MED ORDER — METHYLPREDNISOLONE (PAK) 4 MG PO TABS: ORAL_TABLET | ORAL | Status: DC

## 2014-02-21 MED ORDER — MELOXICAM 15 MG PO TABS: 15.0000 mg | ORAL_TABLET | Freq: Every day | ORAL | Status: DC

## 2014-02-21 NOTE — Progress Notes (Signed)
   Subjective:    Patient ID: Julie Atkinson, female    DOB: 11-08-1971, 43 y.o.   MRN: [id number]  HPI Comments: My feet just hurt. Both feet all over , walking on the sides of feet, causing pain on ankles and knees. Calluses have developed on the bottom of my feet . Real achey pains. Its been about 2  Years and gradually getting worse   Foot Pain Associated symptoms include abdominal pain.      Review of Systems  Eyes: Positive for pain, redness and visual disturbance.  Cardiovascular: Positive for leg swelling.  Gastrointestinal: Positive for abdominal pain and abdominal distention.  Musculoskeletal:       Joint pain  Muscle pain Difficulty walking   Allergic/Immunologic: Positive for environmental allergies.  All other systems reviewed and are negative.      Objective:   Physical Exam: I have reviewed her past medical history medications allergies surgeries social history and review of systems. Pulses are palpable bilateral neurologic sensorium is intact bilateral deep tendon reflexes are intact bilateral muscle strength is 5 over 5 dorsiflexors plantar flexors inverters everters all intrinsic musculature is intact. Orthopedic evaluation should all joints distal ankle a full range of motion crepitation. She has pain on palpation medial continued tubercle of the bilateral heels. Radiographic evaluation does demonstrate a soft tissue increase in density at the plantar fascial calcaneal insertion site indicative of plantar fasciitis. Cutaneous evaluation sub-demonstrates supple well hydrated cutis no erythema edema saline is drainage or odor.        Assessment & Plan:  Assessment: Plantar fasciitis bilateral.  Plan: Discussed etiology pathology conservative or surgical therapies ejected her bilateral heels today for a partial strapping bilateral Dosepak to be followed by medic discussed appropriate shoe gear stretching sizes ice therapy shoe modifications will followup with her in  one month

## 2014-05-24 ENCOUNTER — Ambulatory Visit (INDEPENDENT_AMBULATORY_CARE_PROVIDER_SITE_OTHER): Payer: BC Managed Care – HMO | Admitting: Gynecology

## 2014-05-24 ENCOUNTER — Encounter: Payer: Self-pay | Admitting: Gynecology

## 2014-05-24 VITALS — BP 110/70 | HR 72 | Resp 20 | Ht 66.75 in | Wt 196.0 lb

## 2014-05-24 DIAGNOSIS — Z01419 Encounter for gynecological examination (general) (routine) without abnormal findings: Secondary | ICD-10-CM

## 2014-05-24 DIAGNOSIS — R21 Rash and other nonspecific skin eruption: Secondary | ICD-10-CM

## 2014-05-24 DIAGNOSIS — Z124 Encounter for screening for malignant neoplasm of cervix: Secondary | ICD-10-CM

## 2014-05-24 DIAGNOSIS — N898 Other specified noninflammatory disorders of vagina: Secondary | ICD-10-CM

## 2014-05-24 LAB — POCT URINALYSIS DIPSTICK
Bilirubin, UA: NEGATIVE
Blood, UA: NEGATIVE
Glucose, UA: NEGATIVE
Ketones, UA: NEGATIVE
Leukocytes, UA: NEGATIVE
Nitrite, UA: NEGATIVE
Protein, UA: NEGATIVE
Urobilinogen, UA: NEGATIVE
pH, UA: 5

## 2014-05-24 MED ORDER — NYSTATIN-TRIAMCINOLONE 100000-0.1 UNIT/GM-% EX OINT: 1.0000 "application " | TOPICAL_OINTMENT | Freq: Two times a day (BID) | CUTANEOUS | Status: DC

## 2014-05-24 NOTE — Patient Instructions (Signed)
cetaphil liquid to vulvar area. Avoid harsh detergents and sprays.  Cotton underwear, keep area dry-avoid pantishields, change from moist clothing when able Blow dry on cool setting after bathing. May use a small amount of aveeno in cup to make a slurry and pour over for relief of burning No loofa or wash cloths Apply Glide before biking Change out of wet clothing as soon as can Use female bike shorts without underwear

## 2014-05-24 NOTE — Progress Notes (Signed)
43 y.o. Single Caucasian female   No obstetric history on file. here for annual exam. Pt is not currently sexually active.  Pt is dealing with PTSD after her mother died in her presence, pt is not seeing counselor any longer.  Cycles are regular, variable flow, reports heavier and starts with dark discharge with odor.  No dysmenorrhea.  Pt reports "acute left sided pelvic pain" a few months ago, resolved without treatment.  Pt reports some post void dribbling and drops of urine with valsalva. Pt noticed a rash after she started biking at the gym, is using "old navy" shorts, no underwear  Patient's last menstrual period was 05/14/2014.          Sexually active: No.  The current method of family planning is none     Exercising: Yes.    Bike, Run, Hike  Last pap: Patient doesn't remember  Alcohol: 2 glasses of wine weekly  Tobacco: Former  BSE: yes, Monthly  MMG: 08/2013 BIRADS1: neg    Health Maintenance  Topic Date Due  . Pap Smear  03/14/1988  . Tetanus/tdap  03/14/1989  . Influenza Vaccine  06/08/2014    Family History  Problem Relation Age of Onset  . Pulmonary disease Mother   . Heart disease Mother   . Cancer Mother     Cervical   . Cancer Maternal Aunt     "Female"  . Cancer Paternal Aunt     "Female"  . Cancer Maternal Grandmother     "Female"  . Diabetes Maternal Grandmother   . Cancer Paternal Grandmother     "Female"    There are no active problems to display for this patient.   Past Medical History  Diagnosis Date  . History of posttraumatic stress disorder (PTSD)     Past Surgical History  Procedure Laterality Date  . Cesarean section    . Bladder surgery    . Dilation and curettage of uterus      Ectopic Pregnancy     Allergies: Codeine  Current Outpatient Prescriptions  Medication Sig Dispense Refill  . meloxicam (MOBIC) 15 MG tablet Take 1 tablet (15 mg total) by mouth daily.  30 tablet  3  . Multiple Vitamin (MULTIVITAMIN) tablet Take 1  tablet by mouth daily.      . traZODone (DESYREL) 50 MG tablet Take 50 mg by mouth as needed for sleep.      . methylPREDNIsolone (MEDROL DOSPACK) 4 MG tablet follow package directions  21 tablet  0   No current facility-administered medications for this visit.    ROS: Pertinent items are noted in HPI.  Exam:    BP 110/70  Pulse 72  Resp 20  Ht 5' 6.75" (1.695 m)  Wt 196 lb (88.905 kg)  BMI 30.94 kg/m2  LMP 05/14/2014 Weight change: @WEIGHTCHANGE @ Last 3 height recordings:  Ht Readings from Last 3 Encounters:  05/24/14 5' 6.75" (1.695 m)  02/21/14 5' 6.5" (1.689 m)   General appearance: alert, cooperative and appears stated age Head: Normocephalic, without obvious abnormality, atraumatic Neck: no adenopathy, no carotid bruit, no JVD, supple, symmetrical, trachea midline and thyroid not enlarged, symmetric, no tenderness/mass/nodules Lungs: clear to auscultation bilaterally Breasts: normal appearance, no masses or tenderness Heart: regular rate and rhythm, S1, S2 normal, no murmur, click, rub or gallop Abdomen: soft, non-tender; bowel sounds normal; no masses,  no organomegaly Extremities: extremities normal, atraumatic, no cyanosis or edema Skin: Skin color, texture, turgor normal. No rashes or lesions Lymph nodes:  Cervical, supraclavicular, and axillary nodes normal. no inguinal nodes palpated Neurologic: Grossly normal   Pelvic: External genitalia:  See pic              Urethra: normal appearing urethra with no masses, tenderness or lesions              Bartholins and Skenes: normal                 Vagina: normal appearing vagina with normal color and scant white discharge, no lesions              Cervix: normal appearance              Pap taken: Yes.          Bimanual Exam:  Uterus:  uterus is normal size, shape, consistency and nontender                                      Adnexa:    no masses                                      Rectovaginal: Confirms                                       Anus:  normal sphincter tone, no lesions     1. Encounter for routine gynecological examination  counseled on breast self exam, mammography screening, adequate intake of calcium and vitamin D, diet and exercise return annually or prn  2. Vaginal discharge Scant discharge, will triage off pap Issue with odor, may be related to sweating with new work out routine - POCT Urinalysis Dipstick   3. Screening for cervical cancer Overdue, last pap years ago, no abnormal  - Pap Test with HP (IPS)  4. Perineal rash Discussed genital hygiene, recommend bike specific shorts, glide, change clothing as soon as possible - nystatin-triamcinolone ointment (MYCOLOG); Apply 1 application topically 2 (two) times daily.  Dispense: 30 g; Refill: 0  An After Visit Summary was printed and given to the patient.

## 2014-05-28 LAB — IPS PAP TEST WITH HPV

## 2014-05-29 ENCOUNTER — Telehealth: Payer: Self-pay | Admitting: Obstetrics and Gynecology

## 2014-05-29 NOTE — Telephone Encounter (Signed)
Results sent to nurse, normal neg hpv, needs f/u for rash

## 2014-05-29 NOTE — Telephone Encounter (Signed)
Routing to Dr.Lathrop for review and advise of pap results from 7/17.

## 2014-05-29 NOTE — Telephone Encounter (Signed)
Patient is calling for recent pap results. °

## 2014-05-30 NOTE — Telephone Encounter (Signed)
Spoke with patient. Results given. Patient requesting appointment for follow up before school starts back and in the afternoon. Offered first available on August 11th at 11pm or 11:30pm. Patient is hesitant to schedule. Appointment scheduled for August 11th at 11:30pm with Dr.Lathrop. Patient is scared she may have to cancel because that is an "early appointment". Advised patient if she has to cancel we will need to know 24 hours before appointment. Patient agreeable.  Routing to provider for final review. Patient agreeable to disposition. Will close encounter

## 2014-05-30 NOTE — Telephone Encounter (Signed)
Left message to call Kaitlyn at [phone]. 

## 2014-06-18 ENCOUNTER — Telehealth: Payer: Self-pay | Admitting: Emergency Medicine

## 2014-06-18 ENCOUNTER — Ambulatory Visit: Payer: BC Managed Care – HMO | Admitting: Gynecology

## 2014-06-18 NOTE — Telephone Encounter (Signed)
ok 

## 2014-06-18 NOTE — Telephone Encounter (Addendum)
Patient came in to office for appt at 11:30 once asked for copay for visit patient denied and said she wanted to cancel said that she didn't think she should have to pay a copay for a follow up visit for a rash that isn't even there anymore. Patient said that rash cleared right up with medication and she feels fine. she doesn't feel like she has to have the appt.

## 2014-07-01 ENCOUNTER — Other Ambulatory Visit: Payer: Self-pay | Admitting: Podiatry

## 2014-07-05 ENCOUNTER — Ambulatory Visit: Payer: BC Managed Care – HMO | Admitting: Obstetrics and Gynecology

## 2014-09-09 ENCOUNTER — Encounter: Payer: Self-pay | Admitting: Gynecology

## 2014-10-15 ENCOUNTER — Other Ambulatory Visit: Payer: Self-pay | Admitting: Podiatry

## 2014-10-24 ENCOUNTER — Other Ambulatory Visit: Payer: Self-pay

## 2014-10-28 ENCOUNTER — Telehealth: Payer: Self-pay | Admitting: Obstetrics and Gynecology

## 2014-10-28 NOTE — Telephone Encounter (Signed)
Spoke with patient. Patient states that she has a new partner. Had intercourse over the weekend and has noticed bleeding. First noticed light bleeding Saturday morning after intercourse Friday night. Patient had intercourse last night and woke up with light bleeding again. States that intercourse is painful during and after. "It almost feels like I am swollen. I am just tender." LMP was last week. States that bleeding has a slight odor to it as well. Advised patient will need to be seen for evaluation. Patient is agreeable. Offered patient appointment tomorrow with NP but patient declines. Appointment scheduled for Wednesday at 2:45pm with Dr.Silva. Patient is agreeable to date and time. "I would also like to talk to her about some weight gain. Back in August I began to go to the gym five days a week and really watch what I have been eating. In 7 days I gained 21 pounds. My friend who works in healthcare spoke with a physician he know who said I need to get this looked into. I was hoping to talk with her about this." Advised patient to speak with Dr.Silva about this at time of appointment for further recommendations and possible lab work at appointment. Patient is agreeable.  Routing to provider for final review. Patient agreeable to disposition. Will close encounter

## 2014-10-28 NOTE — Telephone Encounter (Signed)
Patient is having some bleeding and odor after intercourse. Patient last seen 05/22/14 with TL. No chart.

## 2014-10-30 ENCOUNTER — Encounter: Payer: Self-pay | Admitting: Obstetrics and Gynecology

## 2014-10-30 ENCOUNTER — Ambulatory Visit (INDEPENDENT_AMBULATORY_CARE_PROVIDER_SITE_OTHER): Payer: BC Managed Care – HMO | Admitting: Obstetrics and Gynecology

## 2014-10-30 VITALS — BP 104/80 | HR 96 | Resp 18 | Ht 66.75 in | Wt 202.0 lb

## 2014-10-30 DIAGNOSIS — R102 Pelvic and perineal pain: Secondary | ICD-10-CM

## 2014-10-30 DIAGNOSIS — N926 Irregular menstruation, unspecified: Secondary | ICD-10-CM

## 2014-10-30 DIAGNOSIS — N760 Acute vaginitis: Secondary | ICD-10-CM

## 2014-10-30 DIAGNOSIS — A499 Bacterial infection, unspecified: Secondary | ICD-10-CM

## 2014-10-30 DIAGNOSIS — B9689 Other specified bacterial agents as the cause of diseases classified elsewhere: Secondary | ICD-10-CM

## 2014-10-30 DIAGNOSIS — R635 Abnormal weight gain: Secondary | ICD-10-CM

## 2014-10-30 DIAGNOSIS — Z113 Encounter for screening for infections with a predominantly sexual mode of transmission: Secondary | ICD-10-CM

## 2014-10-30 LAB — COMPREHENSIVE METABOLIC PANEL
ALT: 13 U/L (ref 0–35)
AST: 14 U/L (ref 0–37)
Albumin: 3.8 g/dL (ref 3.5–5.2)
Alkaline Phosphatase: 51 U/L (ref 39–117)
BUN: 9 mg/dL (ref 6–23)
CO2: 24 mEq/L (ref 19–32)
Calcium: 8.9 mg/dL (ref 8.4–10.5)
Chloride: 105 mEq/L (ref 96–112)
Creat: 0.77 mg/dL (ref 0.50–1.10)
Glucose, Bld: 97 mg/dL (ref 70–99)
Potassium: 4.1 mEq/L (ref 3.5–5.3)
Sodium: 141 mEq/L (ref 135–145)
Total Bilirubin: 0.3 mg/dL (ref 0.2–1.2)
Total Protein: 6.7 g/dL (ref 6.0–8.3)

## 2014-10-30 LAB — POCT URINALYSIS DIPSTICK
Bilirubin, UA: NEGATIVE
Glucose, UA: NEGATIVE
Ketones, UA: NEGATIVE
Nitrite, UA: NEGATIVE
Protein, UA: NEGATIVE
Urobilinogen, UA: NEGATIVE
pH, UA: 5

## 2014-10-30 LAB — POCT URINE PREGNANCY: Preg Test, Ur: NEGATIVE

## 2014-10-30 LAB — HEMOGLOBIN, FINGERSTICK: Hemoglobin, fingerstick: 13.7 g/dL (ref 12.0–16.0)

## 2014-10-30 MED ORDER — METRONIDAZOLE 500 MG PO TABS: 500.0000 mg | ORAL_TABLET | Freq: Two times a day (BID) | ORAL | Status: DC

## 2014-10-30 NOTE — Progress Notes (Signed)
GYNECOLOGY  VISIT   HPI: 43 y.o.   Single  Caucasian  female   [id number] with Patient's last menstrual period was 10/19/2014.   here for   Irregular Vaginal Bleeding  Abdomen does not feel right for 2 years.  Had significant episode of pain in February 2015.  Saw Dr. Marisue Humble and had a vaginal ultrasound which was normal.   Today is here for vaginal odor.  Accompanied by dark bloody discharge.  This occurs monthly.   New relationship.  Did not use condom with recent intercourse but usually does.  Having vaginal pain and bleeding the day after intercourse and for the next following days. Menses was the week before.  Partner states he felt something,   Partner took presumptive treatment for chlamydia from a prior partner of his.   Trying to have weight loss but not doing as much due to the Holidays.  Doing exercise.  Gained 21 pounds in 7 days in August.  Cannot loose weight.  No recent thyroid check. Does not have a PCP.   Did not take Medrol dose pack for plantar fasciitis.   Menses are a little heavier.  No significant pain.   GYNECOLOGIC HISTORY: Patient's last menstrual period was 10/19/2014. Contraception: None  Menopausal hormone therapy: None        OB History    Gravida Para Term Preterm AB TAB SAB Ectopic Multiple Living   3 1   2  1 1  1          There are no active problems to display for this patient.   Past Medical History  Diagnosis Date  . History of posttraumatic stress disorder (PTSD) 18-Feb-2008    due to maternal death-MI    Past Surgical History  Procedure Laterality Date  . Cesarean section  02/17/2006    Dr Quincy Simmonds  . Bladder surgery  1978    nocturnal enuresis treatment  . Dilation and curettage of uterus  1990    MAB?    Current Outpatient Prescriptions  Medication Sig Dispense Refill  . meloxicam (MOBIC) 15 MG tablet TAKE 1 TABLET BY MOUTH ONCE DAILY 30 tablet 0  . Multiple Vitamin (MULTIVITAMIN) tablet Take 1 tablet by mouth daily.    Marland Kitchen  nystatin-triamcinolone ointment (MYCOLOG) Apply 1 application topically 2 (two) times daily. (Patient not taking: Reported on 10/30/2014) 30 g 0  . traZODone (DESYREL) 50 MG tablet Take 50 mg by mouth as needed for sleep.     No current facility-administered medications for this visit.     ALLERGIES: Codeine  Family History  Problem Relation Age of Onset  . Pulmonary disease Mother   . Heart disease Mother   . Cancer - Cervical Mother     Cervical   . Cancer Maternal Aunt     "Female", unk   . Cancer Paternal Aunt     "Female" unk   . Cancer Maternal Grandmother     "Female", unk  . Diabetes Maternal Grandmother   . Cancer Paternal Grandmother     "Female"    History   Social History  . Marital Status: Single    Spouse Name: N/A    Number of Children: N/A  . Years of Education: N/A   Occupational History  . Not on file.   Social History Main Topics  . Smoking status: Former Smoker    Quit date: 04/06/2014  . Smokeless tobacco: Never Used  . Alcohol Use: 1.2 oz/week    2 Glasses of  wine per week  . Drug Use: No  . Sexual Activity:    Partners: Male    Birth Control/ Protection: None   Other Topics Concern  . Not on file   Social History Narrative    ROS:  Pertinent items are noted in HPI.  PHYSICAL EXAMINATION:    BP 104/80 mmHg  Pulse 96  Resp 18  Ht 5' 6.75" (1.695 m)  Wt 202 lb (91.627 kg)  BMI 31.89 kg/m2  LMP 10/19/2014     General appearance: alert, cooperative and appears stated age Lungs: clear to auscultation bilaterally Heart: regular rate and rhythm Abdomen: soft, non-tender; no masses,  no organomegaly No abnormal inguinal nodes palpated LEs: no edema.   Pelvic: External genitalia:  no lesions              Urethra:  normal appearing urethra with no masses, tenderness or lesions              Bartholins and Skenes: normal                 Vagina: normal appearing vagina with normal color and discharge, no lesions.  Yellowish old blood  noted.               Cervix: normal appearance                   Bimanual Exam:  Uterus:  uterus is normal size, shape, consistency and nontender                                      Adnexa: normal adnexa in size, nontender and no masses                                      Wet prep - pH 5.0, negative yeast and trichomonas.  Positive few clue cells.   ASSESSMENT  Change in menstruation.  Post coital bleeding.  Pelvic pain.  New sexual partner.  Bacterial vaginosis.  Weight gain.   PLAN  CBC, CMP, TSH. STD testing.  Urinalysis and culture. Flagyl 500 mg po bid for 7 days.  Pelvic ultrasound if STD testing normal.   Return prn.    An After Visit Summary was printed and given to the patient.  __25____ minutes face to face time of which over 50% was spent in counseling.

## 2014-10-30 NOTE — Patient Instructions (Signed)
Bacterial Vaginosis Bacterial vaginosis is a vaginal infection that occurs when the normal balance of bacteria in the vagina is disrupted. It results from an overgrowth of certain bacteria. This is the most common vaginal infection in women of childbearing age. Treatment is important to prevent complications, especially in pregnant women, as it can cause a premature delivery. CAUSES  Bacterial vaginosis is caused by an increase in harmful bacteria that are normally present in smaller amounts in the vagina. Several different kinds of bacteria can cause bacterial vaginosis. However, the reason that the condition develops is not fully understood. RISK FACTORS Certain activities or behaviors can put you at an increased risk of developing bacterial vaginosis, including:  Having a new sex partner or multiple sex partners.  Douching.  Using an intrauterine device (IUD) for contraception. Women do not get bacterial vaginosis from toilet seats, bedding, swimming pools, or contact with objects around them. SIGNS AND SYMPTOMS  Some women with bacterial vaginosis have no signs or symptoms. Common symptoms include:  Grey vaginal discharge.  A fishlike odor with discharge, especially after sexual intercourse.  Itching or burning of the vagina and vulva.  Burning or pain with urination. DIAGNOSIS  Your health care provider will take a medical history and examine the vagina for signs of bacterial vaginosis. A sample of vaginal fluid may be taken. Your health care provider will look at this sample under a microscope to check for bacteria and abnormal cells. A vaginal pH test may also be done.  TREATMENT  Bacterial vaginosis may be treated with antibiotic medicines. These may be given in the form of a pill or a vaginal cream. A second round of antibiotics may be prescribed if the condition comes back after treatment.  HOME CARE INSTRUCTIONS   Only take over-the-counter or prescription medicines as  directed by your health care provider.  If antibiotic medicine was prescribed, take it as directed. Make sure you finish it even if you start to feel better.  Do not have sex until treatment is completed.  Tell all sexual partners that you have a vaginal infection. They should see their health care provider and be treated if they have problems, such as a mild rash or itching.  Practice safe sex by using condoms and only having one sex partner. SEEK MEDICAL CARE IF:   Your symptoms are not improving after 3 days of treatment.  You have increased discharge or pain.  You have a fever. MAKE SURE YOU:   Understand these instructions.  Will watch your condition.  Will get help right away if you are not doing well or get worse. FOR MORE INFORMATION  Centers for Disease Control and Prevention, Division of STD Prevention: www.cdc.gov/std American Sexual Health Association (ASHA): www.ashastd.org  Document Released: 10/25/2005 Document Revised: 08/15/2013 Document Reviewed: 06/06/2013 ExitCare Patient Information 2015 [employer]. This information is not intended to replace advice given to you by your health care provider. Make sure you discuss any questions you have with your health care provider.  

## 2014-10-31 LAB — HEPATITIS C ANTIBODY: HCV Ab: NEGATIVE

## 2014-10-31 LAB — STD PANEL
HIV 1&2 Ab, 4th Generation: NONREACTIVE
Hepatitis B Surface Ag: NEGATIVE

## 2014-10-31 LAB — TSH: TSH: 1.535 u[IU]/mL (ref 0.350–4.500)

## 2014-11-01 LAB — URINE CULTURE: Colony Count: 30000

## 2014-11-04 ENCOUNTER — Encounter: Payer: Self-pay | Admitting: Obstetrics and Gynecology

## 2014-11-05 ENCOUNTER — Other Ambulatory Visit: Payer: Self-pay | Admitting: Obstetrics and Gynecology

## 2014-11-05 DIAGNOSIS — N939 Abnormal uterine and vaginal bleeding, unspecified: Secondary | ICD-10-CM

## 2014-11-05 DIAGNOSIS — M25559 Pain in unspecified hip: Secondary | ICD-10-CM

## 2014-11-05 LAB — IPS N GONORRHOEA AND CHLAMYDIA BY PCR

## 2014-11-15 ENCOUNTER — Telehealth: Payer: Self-pay | Admitting: Obstetrics and Gynecology

## 2014-11-15 NOTE — Telephone Encounter (Signed)
Left message for patient to call back. Need to go over benefits and scheduled PUS/EMB PR  $1032.88

## 2014-11-20 ENCOUNTER — Other Ambulatory Visit: Payer: Self-pay | Admitting: Podiatry

## 2014-11-21 ENCOUNTER — Other Ambulatory Visit: Payer: Self-pay | Admitting: Podiatry

## 2014-12-03 NOTE — Telephone Encounter (Signed)
Spoke with patient. Advised that per 2016 benefit quote received, she will need to meet $1500 ded for PUS/EMB.  Pr [id number].  Patient declines service due to cost. Patient wants to know if there is an alternative to the PUS.

## 2014-12-03 NOTE — Telephone Encounter (Signed)
Patient returning call.

## 2014-12-04 NOTE — Telephone Encounter (Signed)
PUS and EMB were recommended for patient due to irregular bleeding and pelvic pain. Patient declines to schedule at this time due to cost. Requesting alternative recommendations. Routing to [place] for review and advise. Would it be best to offer patient appointment at [facility] with payment plan? Assessment from visit with Dr.Silva seen below.  ASSESSMENT  Change in menstruation.  Post coital bleeding.  Pelvic pain.  New sexual partner.  Bacterial vaginosis.  Weight gain.

## 2014-12-06 NOTE — Telephone Encounter (Signed)
I am going to route this to Dr. Quincy Simmonds for recommendations as she assessed the pt.  CC:  Hilario Quarry, RN and Felipa Emory

## 2014-12-08 NOTE — Telephone Encounter (Signed)
Please have patient make an appointment with me to discuss alternatives and initiate plan for bleeding/pain.

## 2014-12-10 NOTE — Telephone Encounter (Signed)
Spoke with patient. Appointment scheduled for 2/9 at 1:30pm with Dr.Silva. Agreeable to date and time.  Routing to provider for final review. Patient agreeable to disposition. Will close encounter

## 2014-12-17 ENCOUNTER — Ambulatory Visit: Payer: Self-pay | Admitting: Obstetrics and Gynecology

## 2014-12-18 ENCOUNTER — Encounter: Payer: Self-pay | Admitting: Obstetrics and Gynecology

## 2014-12-18 ENCOUNTER — Ambulatory Visit (INDEPENDENT_AMBULATORY_CARE_PROVIDER_SITE_OTHER): Payer: BLUE CROSS/BLUE SHIELD | Admitting: Obstetrics and Gynecology

## 2014-12-18 VITALS — BP 100/64 | HR 72 | Temp 98.9°F | Ht 66.75 in | Wt 202.6 lb

## 2014-12-18 DIAGNOSIS — N946 Dysmenorrhea, unspecified: Secondary | ICD-10-CM

## 2014-12-18 DIAGNOSIS — N920 Excessive and frequent menstruation with regular cycle: Secondary | ICD-10-CM

## 2014-12-18 NOTE — Patient Instructions (Signed)
Endometrial Biopsy Endometrial biopsy is a procedure in which a tissue sample is taken from inside the uterus. The tissue sample is then looked at under a microscope to see if the tissue is normal or abnormal. The endometrium is the lining of the uterus. This procedure helps determine where you are in your menstrual cycle and how hormone levels are affecting the lining of the uterus. This procedure may also be used to evaluate uterine bleeding or to diagnose endometrial cancer, tuberculosis, polyps, or inflammatory conditions.  LET YOUR HEALTH CARE PROVIDER KNOW ABOUT:  Any allergies you have.  All medicines you are taking, including vitamins, herbs, eye drops, creams, and over-the-counter medicines.  Previous problems you or members of your family have had with the use of anesthetics.  Any blood disorders you have.  Previous surgeries you have had.  Medical conditions you have.  Possibility of pregnancy. RISKS AND COMPLICATIONS Generally, this is a safe procedure. However, as with any procedure, complications can occur. Possible complications include:  Bleeding.  Pelvic infection.  Puncture of the uterine wall with the biopsy device (rare). BEFORE THE PROCEDURE   Keep a record of your menstrual cycles as directed by your health care provider. You may need to schedule your procedure for a specific time in your cycle.  You may want to bring a sanitary pad to wear home after the procedure.  Arrange for someone to drive you home after the procedure if you will be given a medicine to help you relax (sedative). PROCEDURE   You may be given a sedative to relax you.  You will lie on an exam table with your feet and legs supported as in a pelvic exam.  Your health care provider will insert an instrument (speculum) into your vagina to see your cervix.  Your cervix will be cleansed with an antiseptic solution. A medicine (local anesthetic) will be used to numb the cervix.  A forceps  instrument (tenaculum) will be used to hold your cervix steady for the biopsy.  A thin, rodlike instrument (uterine sound) will be inserted through your cervix to determine the length of your uterus and the location where the biopsy sample will be removed.  A thin, flexible tube (catheter) will be inserted through your cervix and into the uterus. The catheter is used to collect the biopsy sample from your endometrial tissue.  The catheter and speculum will then be removed, and the tissue sample will be sent to a lab for examination. AFTER THE PROCEDURE  You will rest in a recovery area until you are ready to go home.  You may have mild cramping and a small amount of vaginal bleeding for a few days after the procedure. This is normal.  Make sure you find out how to get your test results. Document Released: 02/25/2005 Document Revised: 06/27/2013 Document Reviewed: 04/11/2013 ExitCare Patient Information 2015 [employer]. This information is not intended to replace advice given to you by your health care provider. Make sure you discuss any questions you have with your health care provider.  

## 2014-12-18 NOTE — Progress Notes (Signed)
Patient ID: Julie Atkinson, female   DOB: 10-May-1971, 44 y.o.   MRN: [id number] GYNECOLOGY  VISIT   HPI: 44 y.o.   Single  Caucasian  female   [id number] with Patient's last menstrual period was 12/10/2014 (exact date).   here for consultation regarding options for post coital bleeding and mid lower pelvic and LLQ pain. Patient has a new partner.  Patient was very active prior to partner leaving town.  He is in [employer] and away for 7 months so patient is not sexually active currently.  Had negative STD testing.  Treated for bacterial vaginosis.  "Just does not feel right for two years." Feels bloaty, acidic.  Feels discomfort below Cesarean Section scar on the left side.   Menses regular and monthly, last 4 days.  Second and third days are heavy enough that cannot leave the house.  Uses ultra tampons and a pad and change every hour.  Has bad cramps also.  Uses ibuprofen and which helps some.   Ultrasound in February 2015 - showed an 8 mm fibroid -  through PCP.   GYNECOLOGIC HISTORY: Patient's last menstrual period was 12/10/2014 (exact date). Contraception: condoms/abstinence   Menopausal hormone therapy: n/a        OB History    Gravida Para Term Preterm AB TAB SAB Ectopic Multiple Living   3 1   2  1 1  1          There are no active problems to display for this patient.   Past Medical History  Diagnosis Date  . History of posttraumatic stress disorder (PTSD) 03-01-08    due to maternal death-MI    Past Surgical History  Procedure Laterality Date  . Cesarean section  03/01/2006    Dr Quincy Simmonds  . Bladder surgery  1978    nocturnal enuresis treatment  . Dilation and curettage of uterus  1990    MAB?    Current Outpatient Prescriptions  Medication Sig Dispense Refill  . meloxicam (MOBIC) 15 MG tablet TAKE 1 TABLET BY MOUTH DAILY (Patient taking differently: TAKE 1 TABLET BY MOUTH prn) 30 tablet 0  . Multiple Vitamin (MULTIVITAMIN) tablet Take 1 tablet by mouth daily.    Marland Kitchen  nystatin-triamcinolone ointment (MYCOLOG) Apply 1 application topically 2 (two) times daily. 30 g 0  . traZODone (DESYREL) 50 MG tablet Take 50 mg by mouth as needed for sleep.     No current facility-administered medications for this visit.     ALLERGIES: Codeine  Family History  Problem Relation Age of Onset  . Pulmonary disease Mother   . Heart disease Mother   . Cancer - Cervical Mother     Cervical   . Cancer Maternal Aunt     "Female", unk   . Cancer Paternal Aunt     "Female" unk   . Cancer Maternal Grandmother     "Female", unk  . Diabetes Maternal Grandmother   . Cancer Paternal Grandmother     "Female"    History   Social History  . Marital Status: Single    Spouse Name: N/A  . Number of Children: N/A  . Years of Education: N/A   Occupational History  . Not on file.   Social History Main Topics  . Smoking status: Former Smoker    Quit date: 04/06/2014  . Smokeless tobacco: Never Used  . Alcohol Use: 1.2 oz/week    2 Glasses of wine per week  . Drug Use: No  . Sexual  Activity:    Partners: Male    Patent examiner Protection: Abstinence, Condom   Other Topics Concern  . Not on file   Social History Narrative    ROS:  Pertinent items are noted in HPI.  PHYSICAL EXAMINATION:    BP 100/64 mmHg  Pulse 72  Ht 5' 6.75" (1.695 m)  Wt 202 lb 9.6 oz (91.899 kg)  BMI 31.99 kg/m2  LMP 12/10/2014 (Exact Date)     General appearance: alert, cooperative and appears stated age   ASSESSMENT  Menorrhagia.  Dysmenorrhea.  Family history of reproductive cancer.  Tiny uterine fibroid on ultrasound in February 2015.  Hx of Cesarean Section.   PLAN  Discussion of dysmenorrhea, pelvic pain, and menorrhagia - potential etiologies.  Will have patient return for EMB.  Procedure described.    An After Visit Summary was printed and given to the patient.  _25_____ minutes face to face time of which over 50% was spent in counseling.

## 2014-12-25 ENCOUNTER — Telehealth: Payer: Self-pay

## 2014-12-25 NOTE — Telephone Encounter (Signed)
Left message to call [name] at [phone]. 

## 2014-12-25 NOTE — Telephone Encounter (Addendum)
-----   Message from [place], MD sent at 12/24/2014  8:51 PM EST ----- Regarding: RE: EMB referral FYI I would recommend referral to the [facility] at [facility].  I am concerned about the heaviness of her bleeding and her family history of "reproductive cancers", and I do recommend proceeding with the evaluation.  I am not sure, but the Clinic may be able to do a different financial arrangement?  [place]

## 2014-12-27 NOTE — Telephone Encounter (Signed)
Left message to call Aniyia Rane at [phone]. 

## 2014-12-30 NOTE — Telephone Encounter (Signed)
Spoke with patient. Advised patient of message as seen below from [place]. Patient states "It really isn't the matter of me not having the money. It is whether I want to spend that much money." Advised patient Dr.Silva highly recommends she have this follow up as she is concerned with her heavy bleeding and her family history of reproductive cancer. Patient states "I am still really thinking about it and I will call to let you know. I am still not sure." Advised would let Dr.Silva know but it is highly recommended. Patient is agreeable but declines to schedule with [place] or at [facility] hospital at this time.

## 2014-12-30 NOTE — Telephone Encounter (Signed)
Please note that this patient is declining EMB for menorrhagia and family history of reproductive cancer.  She has also declined ultrasound. She has had an ultrasound through her PCP in January 2015 showing a tiny fibroid. Please keep her in your in basket. We will need to reach out to her again in one month by phone or by letter.   [place]

## 2015-01-30 ENCOUNTER — Telehealth: Payer: Self-pay

## 2015-01-30 NOTE — Telephone Encounter (Signed)
Encounter opened in error

## 2015-01-30 NOTE — Telephone Encounter (Signed)
Left message to call Dorianna Mckiver at [phone]. 

## 2015-02-03 NOTE — Telephone Encounter (Signed)
Letter sent to patient regarding scheduling appointment for EMB.

## 2015-02-03 NOTE — Telephone Encounter (Signed)
I have reviewed your letter.  Thank you for sending this to the patient.

## 2015-03-04 NOTE — Telephone Encounter (Signed)
No patient response to letter mailed on 02-05-15. Any further follow-up needed?

## 2015-03-06 NOTE — Telephone Encounter (Signed)
Patient advised personally by me, by phone, and by letter to proceed with evaluation with EMB. She had a pelvic ultrasound in January 2015 showing a small fibroid.  No further follow up is needed.   I have closed the encounter.

## 2015-05-22 ENCOUNTER — Ambulatory Visit: Payer: BLUE CROSS/BLUE SHIELD | Admitting: Obstetrics and Gynecology

## 2015-06-04 ENCOUNTER — Telehealth: Payer: Self-pay | Admitting: Obstetrics and Gynecology

## 2015-06-04 ENCOUNTER — Ambulatory Visit: Payer: BLUE CROSS/BLUE SHIELD | Admitting: Obstetrics and Gynecology

## 2015-06-04 NOTE — Telephone Encounter (Signed)
Patient was unable to leave another appointment on time. She is rescheduled to Friday 06/06/15 with Dr. Quincy Simmonds

## 2015-06-04 NOTE — Telephone Encounter (Signed)
Thank you for the update.  Encounter closed. 

## 2015-06-06 ENCOUNTER — Encounter: Payer: Self-pay | Admitting: Obstetrics and Gynecology

## 2015-06-06 ENCOUNTER — Ambulatory Visit (INDEPENDENT_AMBULATORY_CARE_PROVIDER_SITE_OTHER): Payer: BLUE CROSS/BLUE SHIELD | Admitting: Obstetrics and Gynecology

## 2015-06-06 VITALS — BP 104/68 | HR 70 | Resp 16 | Ht 67.0 in | Wt 198.2 lb

## 2015-06-06 DIAGNOSIS — Z01419 Encounter for gynecological examination (general) (routine) without abnormal findings: Secondary | ICD-10-CM

## 2015-06-06 DIAGNOSIS — N920 Excessive and frequent menstruation with regular cycle: Secondary | ICD-10-CM | POA: Diagnosis not present

## 2015-06-06 DIAGNOSIS — Z0000 Encounter for general adult medical examination without abnormal findings: Secondary | ICD-10-CM | POA: Diagnosis not present

## 2015-06-06 DIAGNOSIS — Z23 Encounter for immunization: Secondary | ICD-10-CM

## 2015-06-06 DIAGNOSIS — N946 Dysmenorrhea, unspecified: Secondary | ICD-10-CM | POA: Diagnosis not present

## 2015-06-06 DIAGNOSIS — Z113 Encounter for screening for infections with a predominantly sexual mode of transmission: Secondary | ICD-10-CM

## 2015-06-06 LAB — CBC
HCT: 41.4 % (ref 36.0–46.0)
Hemoglobin: 13.7 g/dL (ref 12.0–15.0)
MCH: 31.3 pg (ref 26.0–34.0)
MCHC: 33.1 g/dL (ref 30.0–36.0)
MCV: 94.5 fL (ref 78.0–100.0)
MPV: 10.1 fL (ref 8.6–12.4)
Platelets: 238 10*3/uL (ref 150–400)
RBC: 4.38 MIL/uL (ref 3.87–5.11)
RDW: 13.2 % (ref 11.5–15.5)
WBC: 7.6 10*3/uL (ref 4.0–10.5)

## 2015-06-06 LAB — POCT URINALYSIS DIPSTICK
Bilirubin, UA: NEGATIVE
Blood, UA: NEGATIVE
Glucose, UA: NEGATIVE
Ketones, UA: NEGATIVE
Leukocytes, UA: NEGATIVE
Nitrite, UA: NEGATIVE
Protein, UA: NEGATIVE
Urobilinogen, UA: NEGATIVE
pH, UA: 5

## 2015-06-06 NOTE — Patient Instructions (Signed)

## 2015-06-06 NOTE — Progress Notes (Signed)
Patient ID: Julie Atkinson, female   DOB: [birth year], 44 y.o.   MRN: [id number] 44 y.o. [id number] Single Caucasian female here for annual exam.    Menses are regular and last for 5 days.  First 2 days are painful and heavy.  Uses ultra tamp and pad every 3 hours.  Unable to leave the house.   Wants general labs.  Difficulty with weight loss.  Doing [employer].   Wants STD testing.   Some foul odor with menses.  Strong family history of uterine cancer.   PCP:    None  Patient's last menstrual period was 06/01/2015.          Sexually active: Yes.   female The current method of family planning is abstinence.    Exercising: Yes.    biking, treadmill, walking and weights 5x/week. Smoker:  no  Health Maintenance: Pap:  05-24-14 Neg:Neg HR HPV History of abnormal Pap:  no MMG:  08-20-13 Density Cat.C/Neg:[facility] Colonoscopy:  n/a BMD:   n/a  Result  n/a TDaP:  9-10 years ago  Screening Labs:  Hb today: 14.2, Urine today: Neg   reports that she quit smoking about 14 months ago. She has never used smokeless tobacco. She reports that she drinks about 0.6 - 3.6 oz of alcohol per week. She reports that she does not use illicit drugs.  Past Medical History  Diagnosis Date  . History of posttraumatic stress disorder (PTSD) 2008/02/21    due to maternal death-MI    Past Surgical History  Procedure Laterality Date  . Cesarean section  20-Feb-2006    Dr Quincy Simmonds  . Bladder surgery  1978    nocturnal enuresis treatment  . Dilation and curettage of uterus  1990    MAB?    Current Outpatient Prescriptions  Medication Sig Dispense Refill  . Multiple Vitamin (MULTIVITAMIN) tablet Take 1 tablet by mouth daily.     No current facility-administered medications for this visit.    Family History  Problem Relation Age of Onset  . Pulmonary disease Mother   . Heart disease Mother   . Cancer - Cervical Mother     Cervical   . Cancer Maternal Aunt     "Female", unk   . Cancer Paternal Aunt      "Female" unk   . Cancer Maternal Grandmother     "Female", unk  . Diabetes Maternal Grandmother   . Cancer Paternal Grandmother     "Female"    ROS:  Pertinent items are noted in HPI.  Otherwise, a comprehensive ROS was negative.  Exam:   BP 104/68 mmHg  Pulse 70  Resp 16  Ht 5\' 7"  (1.702 m)  Wt 198 lb 3.2 oz (89.903 kg)  BMI 31.04 kg/m2  LMP 06/01/2015    General appearance: alert, cooperative and appears stated age Head: Normocephalic, without obvious abnormality, atraumatic Neck: no adenopathy, supple, symmetrical, trachea midline and thyroid normal to inspection and palpation Lungs: clear to auscultation bilaterally Breasts: normal appearance, no masses or tenderness, Inspection negative, No nipple retraction or dimpling, No nipple discharge or bleeding, No axillary or supraclavicular adenopathy Heart: regular rate and rhythm Abdomen: soft, non-tender; bowel sounds normal; no masses,  no organomegaly Extremities: extremities normal, atraumatic, no cyanosis or edema Skin: Skin color, texture, turgor normal. No rashes or lesions Lymph nodes: Cervical, supraclavicular, and axillary nodes normal. No abnormal inguinal nodes palpated Neurologic: Grossly normal  Pelvic: External genitalia:  no lesions  Urethra:  normal appearing urethra with no masses, tenderness or lesions              Bartholins and Skenes: normal                 Vagina: normal appearing vagina with normal color and discharge, no lesions              Cervix: no lesions              Pap taken: No. Bimanual Exam:  Uterus:  normal size, contour, position, consistency, mobility, non-tender              Adnexa: normal adnexa and tender left adnexa, no masses.  right adnexa without mass or tenderness.              Rectovaginal: Yes.  .  Confirms.              Anus:  normal sphincter tone, no lesions  Chaperone was present for exam.  Assessment:   Well woman visit with normal exam. Menorrhagia.   Dysmenorrhea.  FH of uterine cancer.   Plan: Yearly mammogram recommended after age 29.  Recommended self breast exam.  Pap and HR HPV as above. Discussed Calcium, Vitamin D, regular exercise program including cardiovascular and weight bearing exercise. Labs performed.  Yes.  .   See orders.  STD testing and routine labs. Refills given on medications.  No..    TDap.  Return for pelvic ultrasound and possible EMB. Follow up annually and prn.      After visit summary provided.

## 2015-06-07 LAB — COMPREHENSIVE METABOLIC PANEL
ALT: 20 U/L (ref 6–29)
AST: 23 U/L (ref 10–35)
Albumin: 4.3 g/dL (ref 3.6–5.1)
Alkaline Phosphatase: 39 U/L (ref 33–115)
BUN: 14 mg/dL (ref 7–25)
CO2: 20 mmol/L (ref 20–31)
Calcium: 9 mg/dL (ref 8.6–10.2)
Chloride: 107 mmol/L (ref 98–110)
Creat: 0.62 mg/dL (ref 0.50–1.10)
Glucose, Bld: 71 mg/dL (ref 65–99)
Potassium: 4 mmol/L (ref 3.5–5.3)
Sodium: 137 mmol/L (ref 135–146)
Total Bilirubin: 0.5 mg/dL (ref 0.2–1.2)
Total Protein: 7.2 g/dL (ref 6.1–8.1)

## 2015-06-07 LAB — LIPID PANEL
Cholesterol: 232 mg/dL — ABNORMAL HIGH (ref 125–200)
HDL: 96 mg/dL (ref >=46)
LDL Cholesterol: 122 mg/dL (ref <130)
Total CHOL/HDL Ratio: 2.4 Ratio (ref <=5.0)
Triglycerides: 70 mg/dL (ref <150)
VLDL: 14 mg/dL (ref <30)

## 2015-06-07 LAB — HEPATITIS C ANTIBODY: HCV Ab: NEGATIVE

## 2015-06-07 LAB — STD PANEL
HIV 1&2 Ab, 4th Generation: NONREACTIVE
Hepatitis B Surface Ag: NEGATIVE

## 2015-06-07 LAB — GC/CHLAMYDIA PROBE AMP, URINE
Chlamydia, Swab/Urine, PCR: NEGATIVE
GC Probe Amp, Urine: NEGATIVE

## 2015-06-11 LAB — HEMOGLOBIN, FINGERSTICK: Hemoglobin, fingerstick: 14.2 g/dL (ref 12.0–16.0)

## 2015-07-02 ENCOUNTER — Telehealth: Payer: Self-pay | Admitting: Obstetrics and Gynecology

## 2015-07-02 NOTE — Telephone Encounter (Signed)
Call to patient to review benefits information for procedure. Patient is going to check her funds in [facility] account and will return call to schedule.

## 2015-07-07 ENCOUNTER — Ambulatory Visit: Payer: BLUE CROSS/BLUE SHIELD | Admitting: Obstetrics and Gynecology

## 2015-07-08 ENCOUNTER — Other Ambulatory Visit: Payer: Self-pay

## 2015-07-08 DIAGNOSIS — Z1231 Encounter for screening mammogram for malignant neoplasm of breast: Secondary | ICD-10-CM

## 2015-07-10 NOTE — Telephone Encounter (Signed)
Call to patient to follow up on call on 07/02/15. Patient declines to schedule at this time and does not know when she will call back to schedule.  Routing to provider for final review. Patient agreeable to disposition. Will close encounter.

## 2015-07-11 ENCOUNTER — Ambulatory Visit: Payer: Self-pay

## 2015-07-15 ENCOUNTER — Ambulatory Visit: Payer: Self-pay

## 2015-08-14 ENCOUNTER — Ambulatory Visit
Admission: RE | Admit: 2015-08-14 | Discharge: 2015-08-14 | Disposition: A | Discharge status: Home / Self Care | Payer: BLUE CROSS/BLUE SHIELD | Source: Ambulatory Visit

## 2015-08-14 DIAGNOSIS — Z1231 Encounter for screening mammogram for malignant neoplasm of breast: Secondary | ICD-10-CM

## 2015-09-01 ENCOUNTER — Ambulatory Visit (INDEPENDENT_AMBULATORY_CARE_PROVIDER_SITE_OTHER): Payer: BLUE CROSS/BLUE SHIELD | Admitting: Internal Medicine

## 2015-09-01 ENCOUNTER — Ambulatory Visit (INDEPENDENT_AMBULATORY_CARE_PROVIDER_SITE_OTHER): Payer: BLUE CROSS/BLUE SHIELD

## 2015-09-01 VITALS — BP 110/70 | HR 74 | Temp 99.1°F | Resp 16 | Ht 67.0 in | Wt 198.0 lb

## 2015-09-01 DIAGNOSIS — S40022A Contusion of left upper arm, initial encounter: Secondary | ICD-10-CM

## 2015-09-01 DIAGNOSIS — F329 Major depressive disorder, single episode, unspecified: Secondary | ICD-10-CM

## 2015-09-01 DIAGNOSIS — S0992XA Unspecified injury of nose, initial encounter: Secondary | ICD-10-CM
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
## 2015-09-01 MED ORDER — CITALOPRAM HYDROBROMIDE 20 MG PO TABS: 20.0000 mg | ORAL_TABLET | Freq: Every day | ORAL | Status: DC

## 2015-09-01 NOTE — Patient Instructions (Signed)
[facility]  Recheck med response 3-4weeks walkin 102 or set up appt at 104
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
## 2015-09-26 ENCOUNTER — Ambulatory Visit (INDEPENDENT_AMBULATORY_CARE_PROVIDER_SITE_OTHER): Payer: BLUE CROSS/BLUE SHIELD | Admitting: Internal Medicine

## 2015-09-26 VITALS — BP 114/72 | HR 73 | Temp 98.9°F | Resp 16 | Ht 67.0 in | Wt 203.6 lb

## 2015-09-26 DIAGNOSIS — S40022A Contusion of left upper arm, initial encounter: Secondary | ICD-10-CM

## 2015-09-26 DIAGNOSIS — F329 Major depressive disorder, single episode, unspecified: Secondary | ICD-10-CM | POA: Diagnosis not present

## 2015-09-26 DIAGNOSIS — G44219 Episodic tension-type headache, not intractable: Secondary | ICD-10-CM | POA: Diagnosis not present

## 2015-09-26 DIAGNOSIS — M545 Low back pain, unspecified: Secondary | ICD-10-CM

## 2015-09-26 DIAGNOSIS — J0100 Acute maxillary sinusitis, unspecified: Secondary | ICD-10-CM | POA: Diagnosis not present

## 2015-09-26 DIAGNOSIS — R059 Cough, unspecified: Secondary | ICD-10-CM

## 2015-09-26 DIAGNOSIS — R05 Cough: Secondary | ICD-10-CM | POA: Diagnosis not present

## 2015-09-26 MED ORDER — AMOXICILLIN 875 MG PO TABS: 875.0000 mg | ORAL_TABLET | Freq: Two times a day (BID) | ORAL | Status: DC

## 2015-09-26 MED ORDER — HYDROCODONE-HOMATROPINE 5-1.5 MG/5ML PO SYRP: 5.0000 mL | ORAL_SOLUTION | Freq: Four times a day (QID) | ORAL | Status: DC | PRN
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
## 2015-11-09 DIAGNOSIS — R768 Other specified abnormal immunological findings in serum: Secondary | ICD-10-CM

## 2015-11-09 HISTORY — DX: Other specified abnormal immunological findings in serum: R76.8

## 2015-12-17 ENCOUNTER — Encounter: Payer: Self-pay | Admitting: Internal Medicine

## 2015-12-17 ENCOUNTER — Ambulatory Visit (INDEPENDENT_AMBULATORY_CARE_PROVIDER_SITE_OTHER): Payer: BLUE CROSS/BLUE SHIELD | Admitting: Internal Medicine

## 2015-12-17 VITALS — BP 111/72 | HR 72 | Temp 98.2°F | Resp 16 | Ht 67.0 in | Wt 201.0 lb

## 2015-12-17 DIAGNOSIS — Z7189 Other specified counseling: Secondary | ICD-10-CM | POA: Diagnosis not present

## 2015-12-17 DIAGNOSIS — L821 Other seborrheic keratosis: Secondary | ICD-10-CM

## 2015-12-17 DIAGNOSIS — Z7184 Encounter for health counseling related to travel: Secondary | ICD-10-CM

## 2015-12-17 DIAGNOSIS — F329 Major depressive disorder, single episode, unspecified: Secondary | ICD-10-CM

## 2015-12-17 MED ORDER — CITALOPRAM HYDROBROMIDE 10 MG PO TABS: 10.0000 mg | ORAL_TABLET | Freq: Every day | ORAL | Status: DC

## 2015-12-17 NOTE — Patient Instructions (Signed)
[facility]

## 2015-12-18 NOTE — Progress Notes (Signed)
   Subjective:    Patient ID: Julie Atkinson, female    DOB: 1971-05-27, 45 y.o.   MRN: [id number]  HPI  Chief Complaint  Patient presents with  . Medication Management  . Travel Consult  . Nevus    left, thoracic area   See 09/2015 reac depr gone! Wants to wean meds Going to [place] and [place] w/ son--cities //Imm UTD New mole? L post thor braline  Review of Systems [place]    Objective:   Physical Exam  Constitutional: She is oriented to person, place, and time. She appears well-developed and well-nourished. No distress.  HENT:  Head: Normocephalic and atraumatic.  Eyes: Pupils are equal, round, and reactive to light.  Neck: Normal range of motion.  Cardiovascular: Normal rate and regular rhythm.   Pulmonary/Chest: Effort normal. No respiratory distress.  Musculoskeletal: Normal range of motion.  Neurological: She is alert and oriented to person, place, and time.  Skin: Skin is warm and dry.  seb kearatosis l post thorac w/out irrit  Psychiatric: She has a normal mood and affect. Her behavior is normal.  Nursing note and vitals reviewed.  BP 111/72 mmHg  Pulse 72  Temp(Src) 98.2 F (36.8 C)  Resp 16  Ht 5\' 7"  (1.702 m)  Wt 201 lb (91.173 kg)  BMI 31.47 kg/m2        Assessment & Plan:  Reactive depression--wean celexa  Travel advice encounter-nothing needed  Seborrheic keratoses-reassured--f/u for removal if irritated by bra strap   Meds ordered this encounter  Medications  . DISCONTD: traZODone (DESYREL) 50 MG tablet    Sig: Take 50 mg by mouth at bedtime.  . citalopram (CELEXA) 10 MG tablet    Sig: Take 1 tablet (10 mg total) by mouth daily.    Dispense:  30 tablet    Refill:  1

## 2015-12-25 ENCOUNTER — Telehealth: Payer: Self-pay | Admitting: Obstetrics and Gynecology

## 2015-12-25 NOTE — Telephone Encounter (Signed)
Patient is having some irregular bleeding and would like an appointment with Dr.Silva. ([facility] does not have a paper chart) Last seen 12/18/14.

## 2015-12-25 NOTE — Telephone Encounter (Signed)
Spoke with patient. Patient states that she has been experiencing irregular bleeding that has been occuring since January. Reports she is having a cycle every other week. In between bleeding she is experiencing a vaginal discharge that has an odor. Denies any pelvic discomfort. Advised she will need to be seen in the office for further evaluation. She is agreeable. Appointment scheduled for 12/26/2015 at 11:30 am with Dr.Silva. She is agreeable to date and time.  Routing to provider for final review. Patient agreeable to disposition. Will close encounter.

## 2015-12-26 ENCOUNTER — Encounter: Payer: Self-pay | Admitting: Obstetrics and Gynecology

## 2015-12-26 ENCOUNTER — Ambulatory Visit (INDEPENDENT_AMBULATORY_CARE_PROVIDER_SITE_OTHER): Payer: BLUE CROSS/BLUE SHIELD | Admitting: Obstetrics and Gynecology

## 2015-12-26 VITALS — BP 110/68 | HR 70 | Ht 67.0 in | Wt 201.6 lb

## 2015-12-26 DIAGNOSIS — R55 Syncope and collapse: Secondary | ICD-10-CM | POA: Diagnosis not present

## 2015-12-26 DIAGNOSIS — N898 Other specified noninflammatory disorders of vagina: Secondary | ICD-10-CM

## 2015-12-26 DIAGNOSIS — N921 Excessive and frequent menstruation with irregular cycle: Secondary | ICD-10-CM | POA: Diagnosis not present

## 2015-12-26 DIAGNOSIS — N9489 Other specified conditions associated with female genital organs and menstrual cycle: Secondary | ICD-10-CM | POA: Diagnosis not present

## 2015-12-26 LAB — COMPREHENSIVE METABOLIC PANEL
ALT: 17 U/L (ref 6–29)
AST: 20 U/L (ref 10–35)
Albumin: 4.3 g/dL (ref 3.6–5.1)
Alkaline Phosphatase: 40 U/L (ref 33–115)
BUN: 16 mg/dL (ref 7–25)
CO2: 20 mmol/L (ref 20–31)
Calcium: 9 mg/dL (ref 8.6–10.2)
Chloride: 107 mmol/L (ref 98–110)
Creat: 0.82 mg/dL (ref 0.50–1.10)
Glucose, Bld: 88 mg/dL (ref 65–99)
Potassium: 4.4 mmol/L (ref 3.5–5.3)
Sodium: 138 mmol/L (ref 135–146)
Total Bilirubin: 0.4 mg/dL (ref 0.2–1.2)
Total Protein: 7.1 g/dL (ref 6.1–8.1)

## 2015-12-26 LAB — CBC
HCT: 40.5 % (ref 36.0–46.0)
Hemoglobin: 13.5 g/dL (ref 12.0–15.0)
MCH: 31.4 pg (ref 26.0–34.0)
MCHC: 33.3 g/dL (ref 30.0–36.0)
MCV: 94.2 fL (ref 78.0–100.0)
MPV: 10.4 fL (ref 8.6–12.4)
Platelets: 225 10*3/uL (ref 150–400)
RBC: 4.3 MIL/uL (ref 3.87–5.11)
RDW: 13 % (ref 11.5–15.5)
WBC: 7.1 10*3/uL (ref 4.0–10.5)

## 2015-12-26 MED ORDER — MELOXICAM 7.5 MG PO TABS: 7.5000 mg | ORAL_TABLET | Freq: Every day | ORAL | Status: DC

## 2015-12-26 NOTE — Progress Notes (Signed)
Patient ID: Julie Atkinson, female   DOB: 26-Jun-1971, 45 y.o.   MRN: [id number] GYNECOLOGY  VISIT   HPI: 45 y.o.   Single  Caucasian  female   [id number] with Patient's last menstrual period was 11/24/2015 (exact date).   here for irregular vaginal bleeding the month of January and complains of vaginal discharge with odor.  Patient states last normal menses was 11-23-15 and then began bleeding again around 12-07-15 brownish/old blood through 12-14-15.  Had thick brown discharge with terrible odor yesterday--she states this brown drainage with odor has been going on for "a while now".  Menses last summer were regular but very heavy.   Using ultra tampons and a thick pad at night.   Patient has declined sonohysterogram and EMB last year.  Notes some vaginal odor.  Feels like she is constantly showering to keep the odor under control.   Denies pain.   She also states she "blacked out" on 12-23-15 for several seconds and had to call a friend over to stay with her. States she was in Hospice all last weekend.  Working out 3 hours per day.  Some dizziness and lightheadedness when she had the black out episode.  Also took an energy drink from her trainer when all of this occurred.   She and her son are going [place] for travel together.   They are world travelers. She is asking about necessary vaccinations.   Wants Rx for Mobic.   Needs a refill.  It helps her sleep.   GYNECOLOGIC HISTORY: Patient's last menstrual period was 11/24/2015 (exact date). Contraception:abstinence since July 2016. Menopausal hormone therapy: n/a Last mammogram: 08-14-15 Density Cat.C/Neg/BiRads1:[facility] Last pap smear: 05-24-14 Neg:Neg HR HPV        OB History    Gravida Para Term Preterm AB TAB SAB Ectopic Multiple Living   3 1   2  1 1  1          There are no active problems to display for this patient.   Past Medical History  Diagnosis Date  . History of posttraumatic stress disorder (PTSD) 07-Mar-2008   due to maternal death-MI  . Depression   . Fibroid Mar 07, 2014    8 mm    Past Surgical History  Procedure Laterality Date  . Cesarean section  2006/03/07    Dr Quincy Simmonds  . Bladder surgery  1978    nocturnal enuresis treatment  . Dilation and curettage of uterus  1990    MAB?    Current Outpatient Prescriptions  Medication Sig Dispense Refill  . citalopram (CELEXA) 10 MG tablet Take 1 tablet (10 mg total) by mouth daily. 30 tablet 1  . Multiple Vitamin (MULTIVITAMIN) tablet Take 1 tablet by mouth daily.    . meloxicam (MOBIC) 7.5 MG tablet Take 1 tablet (7.5 mg total) by mouth daily. 30 tablet 0   No current facility-administered medications for this visit.     ALLERGIES: Codeine  Family History  Problem Relation Age of Onset  . Pulmonary disease Mother   . Heart disease Mother   . Cancer - Cervical Mother     Cervical   . Cancer Maternal Aunt     "Female", unk   . Cancer Paternal Aunt     "Female" unk   . Cancer Maternal Grandmother     "Female", unk  . Diabetes Maternal Grandmother   . Cancer Paternal Grandmother     "Female"    Social History   Social History  .  Marital Status: Single    Spouse Name: N/A  . Number of Children: N/A  . Years of Education: N/A   Occupational History  . Not on file.   Social History Main Topics  . Smoking status: Former Smoker    Quit date: 04/06/2014  . Smokeless tobacco: Never Used  . Alcohol Use: 0.6 - 3.6 oz/week    1-6 Glasses of wine per week  . Drug Use: No  . Sexual Activity:    Partners: Male    Birth Control/ Protection: Abstinence, None   Other Topics Concern  . Not on file   Social History Narrative    ROS:  Pertinent items are noted in HPI.  PHYSICAL EXAMINATION:    BP 110/68 mmHg  Pulse 70  Ht 5\' 7"  (1.702 m)  Wt 201 lb 9.6 oz (91.445 kg)  BMI 31.57 kg/m2  LMP 11/24/2015 (Exact Date)    General appearance: alert, cooperative and appears stated age   Pelvic: External genitalia:  no lesions               Urethra:  normal appearing urethra with no masses, tenderness or lesions              Bartholins and Skenes: normal                 Vagina: normal appearing vagina with normal color and old blood staining the vagina.               Cervix: no lesions                Bimanual Exam:  Uterus:  normal size, contour, position, consistency, mobility, non-tender              Adnexa: normal adnexa and no mass, fullness, tenderness            Chaperone was present for exam.  ASSESSMENT  Menorrhagia with irregular menses.  Vaginal odor.  FH of uterine cancer.  Hx of small uterine fibroid by [place] in 2015. Recent syncopal episode.  Request for NSAID rx.  PLAN  Counseled regarding abnormal uterine bleeding.   Potential etiologies reviewed:  Anovulatory bleeding, fibroids, polyps, endometrial hyperplasia, and cancer.  I have strongly recommended proceeding with a sonohysterogram and endometrial biopsy.  Procedures explained.  Patient is in agreement to proceed.  Course of Provera 10 mg po q d for 10 days.   Discussed that this is intended to stop bleeding and then it will initiate a menstrual cycle.  Affirm done today.  CBC, CMP. Refill of Mobic. Patient will call the health dept to obtain her necessary vaccines.  An After Visit Summary was printed and given to the patient.  ___25___ minutes face to face time of which over 50% was spent in counseling.

## 2015-12-27 ENCOUNTER — Encounter: Payer: Self-pay | Admitting: Obstetrics and Gynecology

## 2015-12-27 LAB — WET PREP BY MOLECULAR PROBE
Candida species: NEGATIVE
Gardnerella vaginalis: NEGATIVE
Trichomonas vaginosis: NEGATIVE

## 2015-12-27 MED ORDER — MEDROXYPROGESTERONE ACETATE 10 MG PO TABS
10.0000 mg | ORAL_TABLET | Freq: Every day | ORAL | Status: DC
Start: 2015-12-27 — End: 2016-01-06

## 2015-12-29 ENCOUNTER — Telehealth: Payer: Self-pay | Admitting: Obstetrics and Gynecology

## 2015-12-29 NOTE — Telephone Encounter (Signed)
Called patient to review benefits for a recommended procedure. Left Voicemail requesting a call back. °

## 2015-12-31 NOTE — Telephone Encounter (Signed)
Patient returned call. She is scheduled for procedures by Lerry Liner.  Patient also given lab results that Dr. Quincy Simmonds sent her via mychart. Patient given instructions for procedures tomorrow and verbalized understanding.  Routing to provider for final review. Patient agreeable to disposition. Will close encounter.

## 2015-12-31 NOTE — Telephone Encounter (Signed)
Spoke with patient regarding benefits for sonohysterogram and endometrial biopsy. Patient understands benefit, but states she will need to delay scheduling. Referred to triage to review

## 2016-01-01 ENCOUNTER — Other Ambulatory Visit: Payer: Self-pay | Admitting: Obstetrics and Gynecology

## 2016-01-01 ENCOUNTER — Ambulatory Visit (INDEPENDENT_AMBULATORY_CARE_PROVIDER_SITE_OTHER): Payer: BLUE CROSS/BLUE SHIELD | Admitting: Obstetrics and Gynecology

## 2016-01-01 ENCOUNTER — Ambulatory Visit (INDEPENDENT_AMBULATORY_CARE_PROVIDER_SITE_OTHER): Payer: BLUE CROSS/BLUE SHIELD

## 2016-01-01 ENCOUNTER — Encounter: Payer: Self-pay | Admitting: Obstetrics and Gynecology

## 2016-01-01 VITALS — BP 110/68 | HR 64 | Ht 67.0 in

## 2016-01-01 DIAGNOSIS — N9489 Other specified conditions associated with female genital organs and menstrual cycle: Secondary | ICD-10-CM | POA: Diagnosis not present

## 2016-01-01 DIAGNOSIS — N921 Excessive and frequent menstruation with irregular cycle: Secondary | ICD-10-CM

## 2016-01-01 DIAGNOSIS — D251 Intramural leiomyoma of uterus: Secondary | ICD-10-CM | POA: Diagnosis not present

## 2016-01-01 NOTE — Progress Notes (Signed)
Subjective  45 y.o. [id number] Single  Caucasian female here for pelvic ultrasound for abnormal uterine bleeding and family history of uterine and cervical cancer.  Can have random clotting.  Notes foul odor.  Had a negative Affirm testing.   Patient's last menstrual period was 12/21/2015 (approximate).  Going to [place] in April.  Doing 2 work outs per day.  Objective  Pelvic ultrasound images and report reviewed with patient.  Uterus - 1.87 cm intramural fibroid.  EMS - 11.89 mm.  Possible mass. Ovaries - normal.  Bilateral follicles noted. Free fluid - no       Technique:  Both transabdominal and transvaginal ultrasound examinations of the pelvis were performed. Transabdominal technique was performed for global imaging of the pelvis including uterus, ovaries, adnexal regions, and pelvic cul-de-sac. It was necessary to proceed with endovaginal exam following the abdominal ultrasound.  Transabdominal exam to visualize the endometrium and adnexa.  Color and duplex Doppler ultrasound was utilized to evaluate blood flow to the ovaries.    Procedure - sonohysterogram Consent performed. Speculum placed in vagina. Sterile prep of cervix with   Hibiclens. Cannula placed inside endometrial cavity without difficulty. Speculum removed. Sterile saline injected.   11 mm           filling defect noted. Cannula removed. No complication.   Assessment  Metrorrhagia.  Endometrial mass.  Uterine fibroid. FH of cervical and uterine cancer.   Plan  Discussion of endometrial masses - likely a benign polyp.  Hyperplasia and malignancy are possible.  Discussion of fibroids - benign nature and rare sarcoma degeneration, symptoms.   Discussion of hysteroscopy with Myosure polypectomy, dilation and curettage.  Risks, benefits, and alternatives reviewed. Risks include but are not limited to bleeding, infection, damage to surrounding organs including uterine perforation requiring  hospitalization and laparoscopy, pulmonary edema, reaction to anesthesia, DVT, PE, death, need for further treatment and surgery including repeat hysteroscopy, hysterectomy or medical therapy.   She understands the fibroid will not be removed. Surgical expectations and recovery discussed.  Patient wishes to proceed. She does decline hysterectomy at this time..  ___25____ minutes face to face time of which over 50% was spent in counseling.   After visit summary to patient.

## 2016-01-05 ENCOUNTER — Telehealth: Payer: Self-pay | Admitting: *Deleted

## 2016-01-05 NOTE — Telephone Encounter (Signed)
She may not take the Mobic tonight.  It can increase bleeding with surgery.

## 2016-01-05 NOTE — Telephone Encounter (Signed)
Call to patient. Advised surgery is scheduled for tomorrow, 01-06-16 at 37 at [facility] to plan arrival at 0900 unless hospital directs otherwise. Surgery instructions reviewed.  Patient takes Mobic QHS for sleep and wants to know if she may take this tonight? Patient also requests Rx upon discharge from hospital for something to help her sleep when she gets home. Advised medications from procedure often result in sleepiness and additional sleep medication is not usually prescribed. Dr Quincy Simmonds will determine if additional medication is needed for any cramping at time of procedure.  Advised will let Dr Quincy Simmonds know of request.

## 2016-01-05 NOTE — Telephone Encounter (Signed)
Call to patient. Per ROI, ok to leave detailed message on [phone]. VM confirm number. Left message advising not to take Mobic tonight due to potential for increased bleeding. Advised can call back if additional questions.  Encounter closed.

## 2016-01-06 ENCOUNTER — Ambulatory Visit (HOSPITAL_COMMUNITY): Payer: BLUE CROSS/BLUE SHIELD | Admitting: Anesthesiology

## 2016-01-06 ENCOUNTER — Ambulatory Visit (HOSPITAL_COMMUNITY)
Admission: RE | Admit: 2016-01-06 | Discharge: 2016-01-06 | Disposition: A | Discharge status: Home / Self Care | Payer: BLUE CROSS/BLUE SHIELD | Source: Ambulatory Visit | Attending: Obstetrics and Gynecology | Admitting: Obstetrics and Gynecology

## 2016-01-06 ENCOUNTER — Encounter (HOSPITAL_COMMUNITY): Payer: Self-pay

## 2016-01-06 ENCOUNTER — Encounter (HOSPITAL_COMMUNITY)
Admission: RE | Disposition: A | Discharge status: Home / Self Care | Payer: Self-pay | Source: Ambulatory Visit | Attending: Obstetrics and Gynecology

## 2016-01-06 DIAGNOSIS — Z87891 Personal history of nicotine dependence: Secondary | ICD-10-CM | POA: Diagnosis not present

## 2016-01-06 DIAGNOSIS — N840 Polyp of corpus uteri: Secondary | ICD-10-CM | POA: Diagnosis not present

## 2016-01-06 DIAGNOSIS — N921 Excessive and frequent menstruation with irregular cycle: Secondary | ICD-10-CM | POA: Insufficient documentation

## 2016-01-06 DIAGNOSIS — D259 Leiomyoma of uterus, unspecified: Secondary | ICD-10-CM | POA: Diagnosis not present

## 2016-01-06 DIAGNOSIS — N939 Abnormal uterine and vaginal bleeding, unspecified: Secondary | ICD-10-CM | POA: Diagnosis not present

## 2016-01-06 HISTORY — PX: DILATATION & CURETTAGE/HYSTEROSCOPY WITH MYOSURE: SHX6511

## 2016-01-06 LAB — PREGNANCY, URINE: Preg Test, Ur: NEGATIVE

## 2016-01-06 SURGERY — DILATATION & CURETTAGE/HYSTEROSCOPY WITH MYOSURE
Anesthesia: General

## 2016-01-06 MED ORDER — MIDAZOLAM HCL 2 MG/2ML IJ SOLN
INTRAMUSCULAR | Status: AC
  Filled 2016-01-06: qty 2

## 2016-01-06 MED ORDER — LACTATED RINGERS IV SOLN: INTRAVENOUS | Status: DC

## 2016-01-06 MED ORDER — ONDANSETRON HCL 4 MG/2ML IJ SOLN
INTRAMUSCULAR | Status: AC
  Filled 2016-01-06: qty 2

## 2016-01-06 MED ORDER — FENTANYL CITRATE (PF) 100 MCG/2ML IJ SOLN
INTRAMUSCULAR | Status: AC
  Filled 2016-01-06: qty 2

## 2016-01-06 MED ORDER — KETOROLAC TROMETHAMINE 30 MG/ML IJ SOLN
INTRAMUSCULAR | Status: AC
  Filled 2016-01-06: qty 1

## 2016-01-06 MED ORDER — DEXAMETHASONE SODIUM PHOSPHATE 4 MG/ML IJ SOLN
INTRAMUSCULAR | Status: DC | PRN
  Administered 2016-01-06: 10 mg via INTRAVENOUS

## 2016-01-06 MED ORDER — LIDOCAINE HCL (CARDIAC) 20 MG/ML IV SOLN
INTRAVENOUS | Status: AC
  Filled 2016-01-06: qty 5

## 2016-01-06 MED ORDER — PROPOFOL 10 MG/ML IV BOLUS
INTRAVENOUS | Status: DC | PRN
  Administered 2016-01-06: 200 mg via INTRAVENOUS

## 2016-01-06 MED ORDER — PROMETHAZINE HCL 25 MG/ML IJ SOLN: 6.2500 mg | INTRAMUSCULAR | Status: DC | PRN

## 2016-01-06 MED ORDER — FENTANYL CITRATE (PF) 100 MCG/2ML IJ SOLN
INTRAMUSCULAR | Status: DC | PRN
  Administered 2016-01-06 (×2): 50 ug via INTRAVENOUS

## 2016-01-06 MED ORDER — MIDAZOLAM HCL 5 MG/5ML IJ SOLN
INTRAMUSCULAR | Status: DC | PRN
  Administered 2016-01-06: 2 mg via INTRAVENOUS

## 2016-01-06 MED ORDER — PROPOFOL 10 MG/ML IV BOLUS
INTRAVENOUS | Status: AC
  Filled 2016-01-06: qty 20

## 2016-01-06 MED ORDER — LIDOCAINE HCL 1 % IJ SOLN
INTRAMUSCULAR | Status: DC | PRN
  Administered 2016-01-06: 10 mL

## 2016-01-06 MED ORDER — SCOPOLAMINE 1 MG/3DAYS TD PT72
MEDICATED_PATCH | TRANSDERMAL | Status: AC
  Administered 2016-01-06: 1.5 mg via TRANSDERMAL
  Filled 2016-01-06: qty 1

## 2016-01-06 MED ORDER — ONDANSETRON HCL 4 MG/2ML IJ SOLN
INTRAMUSCULAR | Status: DC | PRN
  Administered 2016-01-06: 4 mg via INTRAVENOUS

## 2016-01-06 MED ORDER — SODIUM CHLORIDE 0.9 % IR SOLN
Status: DC | PRN
  Administered 2016-01-06: 3000 mL

## 2016-01-06 MED ORDER — LACTATED RINGERS IV SOLN
INTRAVENOUS | Status: DC
  Administered 2016-01-06: 125 mL/h via INTRAVENOUS
  Administered 2016-01-06: 11:00:00 via INTRAVENOUS

## 2016-01-06 MED ORDER — IBUPROFEN 800 MG PO TABS: 800.0000 mg | ORAL_TABLET | Freq: Three times a day (TID) | ORAL | Status: DC | PRN

## 2016-01-06 MED ORDER — KETOROLAC TROMETHAMINE 30 MG/ML IJ SOLN: 30.0000 mg | Freq: Once | INTRAMUSCULAR | Status: DC | PRN

## 2016-01-06 MED ORDER — FENTANYL CITRATE (PF) 100 MCG/2ML IJ SOLN: 25.0000 ug | INTRAMUSCULAR | Status: DC | PRN

## 2016-01-06 MED ORDER — LIDOCAINE HCL 1 % IJ SOLN
INTRAMUSCULAR | Status: AC
Start: 2016-01-06 — End: 2016-01-06
  Filled 2016-01-06: qty 20

## 2016-01-06 MED ORDER — KETOROLAC TROMETHAMINE 30 MG/ML IJ SOLN
INTRAMUSCULAR | Status: DC | PRN
  Administered 2016-01-06: 30 mg via INTRAVENOUS

## 2016-01-06 MED ORDER — DEXAMETHASONE SODIUM PHOSPHATE 10 MG/ML IJ SOLN
INTRAMUSCULAR | Status: AC
  Filled 2016-01-06: qty 1

## 2016-01-06 MED ORDER — LIDOCAINE HCL (CARDIAC) 20 MG/ML IV SOLN
INTRAVENOUS | Status: DC | PRN
  Administered 2016-01-06: 100 mg via INTRAVENOUS

## 2016-01-06 MED ORDER — SCOPOLAMINE 1 MG/3DAYS TD PT72
1.0000 | MEDICATED_PATCH | Freq: Once | TRANSDERMAL | Status: DC
  Administered 2016-01-06: 1.5 mg via TRANSDERMAL

## 2016-01-06 SURGICAL SUPPLY — 20 items
CANISTER SUCT 3000ML (MISCELLANEOUS) ×2 IMPLANT
CATH ROBINSON RED A/P 16FR (CATHETERS) ×2 IMPLANT
CLOTH BEACON ORANGE TIMEOUT ST (SAFETY) ×2 IMPLANT
CONTAINER PREFILL 10% NBF 60ML (FORM) ×4 IMPLANT
DEVICE MYOSURE LITE (MISCELLANEOUS) ×1 IMPLANT
DEVICE MYOSURE REACH (MISCELLANEOUS) IMPLANT
ELECT REM PT RETURN 9FT ADLT (ELECTROSURGICAL)
ELECTRODE REM PT RTRN 9FT ADLT (ELECTROSURGICAL) IMPLANT
FILTER ARTHROSCOPY CONVERTOR (FILTER) ×2 IMPLANT
GLOVE BIO SURGEON STRL SZ 6.5 (GLOVE) ×2 IMPLANT
GLOVE BIOGEL PI IND STRL 7.0 (GLOVE) ×2 IMPLANT
GLOVE BIOGEL PI INDICATOR 7.0 (GLOVE) ×2
GOWN STRL REUS W/TWL LRG LVL3 (GOWN DISPOSABLE) ×4 IMPLANT
PACK VAGINAL MINOR WOMEN LF (CUSTOM PROCEDURE TRAY) ×2 IMPLANT
PAD OB MATERNITY 4.3X12.25 (PERSONAL CARE ITEMS) ×2 IMPLANT
SEAL ROD LENS SCOPE MYOSURE (ABLATOR) ×2 IMPLANT
TOWEL OR 17X24 6PK STRL BLUE (TOWEL DISPOSABLE) ×4 IMPLANT
TUBING AQUILEX INFLOW (TUBING) ×2 IMPLANT
TUBING AQUILEX OUTFLOW (TUBING) ×2 IMPLANT
WATER STERILE IRR 1000ML POUR (IV SOLUTION) ×2 IMPLANT

## 2016-01-06 NOTE — Transfer of Care (Signed)
Immediate Anesthesia Transfer of Care Note  Patient: Julie Atkinson  Procedure(s) Performed: Procedure(s): DILATATION & CURETTAGE/HYSTEROSCOPY WITH MYOSURE (N/A)  Patient Location: PACU  Anesthesia Type:General  Level of Consciousness: awake, alert  and oriented  Airway & Oxygen Therapy: Patient Spontanous Breathing and Patient connected to nasal cannula oxygen  Post-op Assessment: Report given to RN and Post -op Vital signs reviewed and stable  Post vital signs: Reviewed and stable  Last Vitals:  Filed Vitals:   01/06/16 0924  BP: 113/70  Pulse: 54  Temp: 36.9 C  Resp: 16    Complications: No apparent anesthesia complications

## 2016-01-06 NOTE — Anesthesia Preprocedure Evaluation (Signed)
Anesthesia Evaluation  Patient identified by MRN, date of birth, ID band Patient awake    Reviewed: Allergy & Precautions, NPO status , Patient's Chart, lab work & pertinent test results  Airway Mallampati: II  TM Distance: >3 FB Neck ROM: Full    Dental no notable dental hx.    Pulmonary neg pulmonary ROS, former smoker,    Pulmonary exam normal breath sounds clear to auscultation       Cardiovascular negative cardio ROS Normal cardiovascular exam Rhythm:Regular Rate:Normal     Neuro/Psych negative neurological ROS  negative psych ROS   GI/Hepatic negative GI ROS, Neg liver ROS,   Endo/Other  negative endocrine ROS  Renal/GU negative Renal ROS  negative genitourinary   Musculoskeletal negative musculoskeletal ROS (+)   Abdominal   Peds negative pediatric ROS (+)  Hematology negative hematology ROS (+)   Anesthesia Other Findings   Reproductive/Obstetrics negative OB ROS                             Anesthesia Physical Anesthesia Plan  ASA: I  Anesthesia Plan: General   Post-op Pain Management:    Induction: Intravenous  Airway Management Planned: LMA  Additional Equipment:   Intra-op Plan:   Post-operative Plan: Extubation in OR  Informed Consent: I have reviewed the patients History and Physical, chart, labs and discussed the procedure including the risks, benefits and alternatives for the proposed anesthesia with the patient or authorized representative who has indicated his/her understanding and acceptance.   Dental advisory given  Plan Discussed with: CRNA and Surgeon  Anesthesia Plan Comments:         Anesthesia Quick Evaluation

## 2016-01-06 NOTE — Discharge Instructions (Addendum)
Julie Atkinson,   I was able to remove the polyp, and I did the curettage of the uterus as well.  You did well with surgery today!  Julie Half, MD     Hysteroscopy, Care After Refer to this sheet in the next few weeks. These instructions provide you with information on caring for yourself after your procedure. Your health care provider may also give you more specific instructions. Your treatment has been planned according to current medical practices, but problems sometimes occur. Call your health care provider if you have any problems or questions after your procedure.  WHAT TO EXPECT AFTER THE PROCEDURE After your procedure, it is typical to have the following:  You may have some cramping. This normally lasts for a couple days.  You may have bleeding. This can vary from light spotting for a few days to menstrual-like bleeding for 3-7 days. HOME CARE INSTRUCTIONS  Rest for the first 1-2 days after the procedure.  Only take over-the-counter or prescription medicines as directed by your health care provider. Do not take aspirin. It can increase the chances of bleeding.  Take showers instead of baths for 2 weeks or as directed by your health care provider.  Do not drive for 24 hours or as directed.  Do not drink alcohol while taking pain medicine.  Do not use tampons, douche, or have sexual intercourse for 2 weeks or until your health care provider says it is okay.  Take your temperature twice a day for 4-5 days. Write it down each time.  Follow your health care provider's advice about diet, exercise, and lifting.  If you develop constipation, you may:  Take a mild laxative if your health care provider approves.  Add bran foods to your diet.  Drink enough fluids to keep your urine clear or pale yellow.  Try to have someone with you or available to you for the first 24-48 hours, especially if you were given a general anesthetic.  Follow up with your health care provider as  directed. SEEK MEDICAL CARE IF:  You feel dizzy or lightheaded.  You feel sick to your stomach (nauseous).  You have abnormal vaginal discharge.  You have a rash.  You have pain that is not controlled with medicine. SEEK IMMEDIATE MEDICAL CARE IF:  You have bleeding that is heavier than a normal menstrual period.  You have a fever.  You have increasing cramps or pain, not controlled with medicine.  You have new belly (abdominal) pain.  You pass out.  You have pain in the tops of your shoulders (shoulder strap areas).  You have shortness of breath.   This information is not intended to replace advice given to you by your health care provider. Make sure you discuss any questions you have with your health care provider.   Document Released: 08/15/2013 Document Reviewed: 08/15/2013 Elsevier Interactive Patient Education 2016 Elsevier Inc[employer]  Dilation and Curettage or Vacuum Curettage, Care After Refer to this sheet in the next few weeks. These instructions provide you with information on caring for yourself after your procedure. Your health care provider may also give you more specific instructions. Your treatment has been planned according to current medical practices, but problems sometimes occur. Call your health care provider if you have any problems or questions after your procedure. WHAT TO EXPECT AFTER THE PROCEDURE After your procedure, it is typical to have light cramping and bleeding. This may last for 2 days to 2 weeks after the procedure. HOME CARE INSTRUCTIONS  Do not drive for 24 hours.  Wait 1 week before returning to strenuous activities.  Take your temperature 2 times a day for 4 days and write it down. Provide these temperatures to your health care provider if you develop a fever.  Avoid long periods of standing.  Avoid heavy lifting, pushing, or pulling. Do not lift anything heavier than 10 pounds (4.5 kg).  Limit stair climbing to once or twice a  day.  Take rest periods often.  You may resume your usual diet.  Drink enough fluids to keep your urine clear or pale yellow.  Your usual bowel function should return. If you have constipation, you may:  Take a mild laxative with permission from your health care provider.  Add fruit and bran to your diet.  Drink more fluids.  Take showers instead of baths until your health care provider gives you permission to take baths.  Do not go swimming or use a hot tub until your health care provider approves.  Try to have someone with you or available to you the first 24-48 hours, especially if you were given a general anesthetic.  Do not douche, use tampons, or have sex (intercourse) for 2 weeks after the procedure.  Only take over-the-counter or prescription medicines as directed by your health care provider. Do not take aspirin. It can cause bleeding.  Follow up with your health care provider as directed. SEEK MEDICAL CARE IF:   You have increasing cramps or pain that is not relieved with medicine.  You have abdominal pain that does not seem to be related to the same area of earlier cramping and pain.  You have bad smelling vaginal discharge.  You have a rash.  You are having problems with any medicine. SEEK IMMEDIATE MEDICAL CARE IF:   You have bleeding that is heavier than a normal menstrual period.  You have a fever.  You have chest pain.  You have shortness of breath.  You feel dizzy or feel like fainting.  You pass out.  You have pain in your shoulder strap area.  You have heavy vaginal bleeding with or without blood clots. MAKE SURE YOU:   Understand these instructions.  Will watch your condition.  Will get help right away if you are not doing well or get worse.   This information is not intended to replace advice given to you by your health care provider. Make sure you discuss any questions you have with your health care provider.   Document Released:  10/22/2000 Document Revised: 10/30/2013 Document Reviewed: 05/24/2013 Elsevier Interactive Patient Education 2016 Elsevier Inc.  NO IBUPROFEN PRODUCTS UNTIL 4:45 PM  Post Anesthesia Home Care Instructions  Activity: Get plenty of rest for the remainder of the day. A responsible adult should stay with you for 24 hours following the procedure.  For the next 24 hours, DO NOT: -Drive a car -Paediatric nurse -Drink alcoholic beverages -Take any medication unless instructed by your physician -Make any legal decisions or sign important papers.  Meals: Start with liquid foods such as gelatin or soup. Progress to regular foods as tolerated. Avoid greasy, spicy, heavy foods. If nausea and/or vomiting occur, drink only clear liquids until the nausea and/or vomiting subsides. Call your physician if vomiting continues.  Special Instructions/Symptoms: Your throat may feel dry or sore from the anesthesia or the breathing tube placed in your throat during surgery. If this causes discomfort, gargle with warm salt water. The discomfort should disappear within 24 hours.  If you  had a scopolamine patch placed behind your ear for the management of post- operative nausea and/or vomiting:  1. The medication in the patch is effective for 72 hours, after which it should be removed.  Wrap patch in a tissue and discard in the trash. Wash hands thoroughly with soap and water. 2. You may remove the patch earlier than 72 hours if you experience unpleasant side effects which may include dry mouth, dizziness or visual disturbances. 3. Avoid touching the patch. Wash your hands with soap and water after contact with the patch.

## 2016-01-06 NOTE — H&P (Signed)
Subjective   45 y.o. [id number] Single Caucasian female here for pelvic ultrasound for abnormal uterine bleeding and family history of uterine and cervical cancer.   Can have random clotting.  Notes foul odor.  Had a negative Affirm testing.   Patient's last menstrual period was 12/21/2015 (approximate).  Going to [place] in Mar 03, 2023.  Doing 2 work outs per day.  OB History    Gravida Para Term Preterm AB TAB SAB Ectopic Multiple Living   3 1   2  1 1  1        There are no active problems to display for this patient.   Past Medical History  Diagnosis Date  . History of posttraumatic stress disorder (PTSD) 2008/03/02    due to maternal death-MI  . Depression   . Fibroid 2014-03-02    8 mm    Past Surgical History  Procedure Laterality Date  . Cesarean section  02-Mar-2006    Dr Quincy Simmonds  . Bladder surgery  1978    nocturnal enuresis treatment  . Dilation and curettage of uterus  1990    MAB?    Current Outpatient Prescriptions  Medication Sig Dispense Refill  . citalopram (CELEXA) 10 MG tablet Take 1 tablet (10 mg total) by mouth daily. 30 tablet 1  . Multiple Vitamin (MULTIVITAMIN) tablet Take 1 tablet by mouth daily.    . meloxicam (MOBIC) 7.5 MG tablet Take 1 tablet (7.5 mg total) by mouth daily. 30 tablet 0   No current facility-administered medications for this visit.     ALLERGIES: Codeine  Family History  Problem Relation Age of Onset  . Pulmonary disease Mother   . Heart disease Mother   . Cancer - Cervical Mother     Cervical   . Cancer Maternal Aunt     "Female", unk   . Cancer Paternal Aunt     "Female" unk   . Cancer Maternal Grandmother     "Female", unk  . Diabetes Maternal Grandmother   . Cancer Paternal Grandmother     "Female"    Social History   Social History  . Marital Status: Single     Spouse Name: N/A  . Number of Children: N/A  . Years of Education: N/A   Occupational History  . Not on file.   Social History Main Topics  . Smoking status: Former Smoker    Quit date: 04/06/2014  . Smokeless tobacco: Never Used  . Alcohol Use: 0.6 - 3.6 oz/week    1-6 Glasses of wine per week  . Drug Use: No  . Sexual Activity:    Partners: Male    Birth Control/ Protection: Abstinence, None   Other Topics Concern  . Not on file   Social History Narrative    ROS: Pertinent items are noted in HPI.       Objective   Physical exam   T 98.5, BP 113/70, P 54, RR 16 Lungs - CTA bilaterally.  Cor - S1S2 RRR.  No murmur.  Abdomen - 1.0 cm subcutaneous cystic area of right upper abdominal skin, mild erythema of skin.  Abdomen soft, nontender, and no masses. Pelvic - deferred to OR. Extremities - PAS and Ted hose on.  DPs 2+ bilaterally.   Pelvic ultrasound: Uterus - 1.87 cm intramural fibroid.  EMS - 11.89 mm. Possible mass. Ovaries - normal. Bilateral follicles noted. Free fluid - no       Technique: Both transabdominal and transvaginal ultrasound examinations of the pelvis were performed.  Transabdominal technique was performed for global imaging of the pelvis including uterus, ovaries, adnexal regions, and pelvic cul-de-sac. It was necessary to proceed with endovaginal exam following the abdominal ultrasound. Transabdominal exam to visualize the endometrium and adnexa. Color and duplex Doppler ultrasound was utilized to evaluate blood flow to the ovaries.    Procedure - sonohysterogram Consent performed. Speculum placed in vagina. Sterile prep of cervix with Hibiclens. Cannula placed inside endometrial cavity without difficulty. Speculum removed. Sterile saline injected.  11 mm filling defect noted. Cannula removed. No complication.   Assessment  Metrorrhagia.  Endometrial mass.   Uterine fibroid. FH of cervical and uterine cancer.   Plan  Discussion of endometrial masses - likely a benign polyp. Hyperplasia and malignancy are possible.  Discussion of fibroids - benign nature and rare sarcoma degeneration, symptoms.   Discussion of hysteroscopy with Myosure polypectomy, dilation and curettage.  Risks, benefits, and alternatives reviewed. Risks include but are not limited to bleeding, infection, damage to surrounding organs including uterine perforation requiring hospitalization and laparoscopy, pulmonary edema, reaction to anesthesia, DVT, PE, death, need for further treatment and surgery including repeat hysteroscopy, hysterectomy or medical therapy.  She understands the fibroid will not be removed. Surgical expectations and recovery discussed.  Patient wishes to proceed. She does decline hysterectomy at this time..  ___25____ minutes face to face time of which over 50% was spent in counseling.   After visit summary to patient.

## 2016-01-06 NOTE — Op Note (Signed)
OPERATIVE REPORT   PREOPERATIVE DIAGNOSES:   Metrorrhagia and endometrial mass.  POSTOPERATIVE DIAGNOSES:   Metrorrhagia and endometrial polyp.  PROCEDURE:  Hysteroscopy with dilation and curettage and Myosure resection of endometrial polyp.  SURGEON:  Lenard Galloway, MD  ANESTHESIA:  LMA, paracervical block with 10 mL of 1% lidocaine.  EBL:  15 cc.  NORMAL SALINE DEFICIT:   330 cc.  COMPLICATIONS:  None.  INDICATIONS FOR THE PROCEDURE:     The patient is a 45 year old P36 Caucasian female who presents with irregular bleeding, sonohysterogram showing an 11 mm endometrial mass, and a family history of uterine cancer.    A plan is now made to proceed with a hysteroscopy with dilation and curettage and Mysure resection of an endometrial mass; after risks, benefits and alternatives were reviewed.  FINDINGS:  Exam under anesthesia revealed a small anteverted uterus.  No adnexal masses were noted. The uterus was sounded to 7 cm. Hysteroscopy showed a 1 cm polyp attached to the anterior midfundus. Endometrial currettings were moderate in quantity.  SPECIMENS:  Endometrial curettings and the endometrial polyp were sent to Pathology separately.   PROCEDURE IN DETAIL:  The patient was reidentified in the preoperative hold area.  She received TED hose and PAS stockings for DVT prophylaxis.  In the operating room, the patient was placed in the dorsal lithotomy position and then an LMA anesthetic was introduced.  The patient's lower abdomen, vagina and perineum were sterilely prepped with Betadine and the  patient's bladder was catheterized of urine.  She was sterilly draped.  An exam under anesthesia was performed.  A speculum was placed inside the vagina and a single-tooth tenaculum was placed on the anterior cervical lip.  A paracervical block was performed with a total of 10 mL of 1% lidocaine plain.  The uterus was sounded. The cervix was dilated to a  #21 Pratt dilator.  The MyoSure hysteroscope was then inserted inside the uterine cavity under the continuous infusion of normal saline solution.  Findings are as noted above.  The polyp  appeared to not be attached at this time.  The MyoSure hysteroscope was removed.  The serrated curette were introduced into the uterine cavity and the endometrium was curetted in all 4 quadrants, and this specimen was sent to Pathology.  The hysteroscopy was repeated and the polyp was noted to still be present at attached To the anterior uterine wall.  The Myosure was used to remove the polyp in entirety and  without difficulty.  The specimen was sent to Pathology separately.  The Myosure  hysteroscope was removed.  The single-tooth tenaculum which had been placed on the anterior cervical lip was removed.  Hemostasis was good, and all of the vaginal instruments were removed.  The patient was awakened and escorted to the recovery room in stable condition after she was cleansed with Betadine.  There were no complications to the procedure.  All needle, instrument and sponge counts were correct.  Lenard Galloway, MD

## 2016-01-06 NOTE — Anesthesia Postprocedure Evaluation (Signed)
Anesthesia Post Note  Patient: Julie Atkinson  Procedure(s) Performed: Procedure(s) (LRB): DILATATION & CURETTAGE/HYSTEROSCOPY WITH MYOSURE (N/A)  Patient location during evaluation: PACU Anesthesia Type: General Level of consciousness: awake and alert Pain management: pain level controlled Vital Signs Assessment: post-procedure vital signs reviewed and stable Respiratory status: spontaneous breathing, nonlabored ventilation, respiratory function stable and patient connected to nasal cannula oxygen Cardiovascular status: blood pressure returned to baseline and stable Postop Assessment: no signs of nausea or vomiting Anesthetic complications: no    Last Vitals:  Filed Vitals:   01/06/16 0924  BP: 113/70  Pulse: 54  Temp: 36.9 C  Resp: 16    Last Pain: There were no vitals filed for this visit.               Keona Sheffler S

## 2016-01-06 NOTE — Brief Op Note (Signed)
01/06/2016  10:51 AM  PATIENT:  Julie Atkinson  45 y.o. female  PRE-OPERATIVE DIAGNOSIS:  endometrial mass, metrorrhagia  POST-OPERATIVE DIAGNOSIS:  endometrial mass, metrorrhagia  PROCEDURE:  Procedure(s): DILATATION & CURETTAGE/HYSTEROSCOPY WITH MYOSURE (N/A)  RESECTION OF ENDOMETRIAL POLYP  SURGEON:  Surgeon(s) and Role:    * Brook E Yisroel Ramming, MD - Primary  PHYSICIAN ASSISTANT: NA  ASSISTANTS: none   ANESTHESIA:   paracervical block and LMA  EBL:  Total I/O In: -  Out: 10 [Blood:10]  BLOOD ADMINISTERED:none  DRAINS: none   LOCAL MEDICATIONS USED:  LIDOCAINE  and Amount: 10 ml  SPECIMEN:  Source of Specimen:  endometrial curettings and endometrial polyp  DISPOSITION OF SPECIMEN:  PATHOLOGY  COUNTS:  YES  TOURNIQUET:  * No tourniquets in log *  DICTATION: .Note written in EPIC  PLAN OF CARE: Discharge to home after PACU  PATIENT DISPOSITION:  PACU - hemodynamically stable.   Delay start of Pharmacological VTE agent (>24hrs) due to surgical blood loss or risk of bleeding: not applicable

## 2016-01-06 NOTE — Anesthesia Procedure Notes (Signed)
Procedure Name: LMA Insertion Date/Time: 01/06/2016 10:23 AM Performed by: Riki Sheer Pre-anesthesia Checklist: Patient identified, Emergency Drugs available, Suction available, Patient being monitored and Timeout performed Patient Re-evaluated:Patient Re-evaluated prior to inductionOxygen Delivery Method: Circle system utilized Preoxygenation: Pre-oxygenation with 100% oxygen Intubation Type: IV induction Ventilation: Mask ventilation without difficulty LMA: LMA inserted LMA Size: 4.0 Number of attempts: 1 Placement Confirmation: positive ETCO2,  CO2 detector and breath sounds checked- equal and bilateral Tube secured with: Tape Dental Injury: Teeth and Oropharynx as per pre-operative assessment

## 2016-01-07 ENCOUNTER — Encounter (HOSPITAL_COMMUNITY): Payer: Self-pay | Admitting: Obstetrics and Gynecology

## 2016-01-08 ENCOUNTER — Telehealth: Payer: Self-pay

## 2016-01-08 NOTE — Telephone Encounter (Signed)
Spoke with patient. Advised of message as seen below from [facility]. She is agreeable and verbalizes understanding.  Routing to provider for final review. Patient agreeable to disposition. Will close encounter.

## 2016-01-08 NOTE — Telephone Encounter (Signed)
Spoke with patient. Advised of results as seen below form Dr.Silva. She is agreeable and verbalizes understanding. Asking when she will be able to return to her "Bootcamp" class. States that bootcamp includes weight lifting, cardio, squats, and sit ups. Advised I will speak with Dr.Silva regarding recommendation and return call. She is agreeable.

## 2016-01-08 NOTE — Telephone Encounter (Signed)
-----   Message from Nunzio Cobbs, MD sent at 01/07/2016  8:25 PM EST ----- Please inform patient of her surgical pathology reports showing a benign endometrial polyp and proliferative endometrium.   No hyperplasia or malignancy were seen.  I hope she is doing well following her surgery!

## 2016-01-08 NOTE — Telephone Encounter (Signed)
Ok to return to boot camp when her surgical bleeding stops.

## 2016-01-19 ENCOUNTER — Encounter: Payer: Self-pay | Admitting: Obstetrics and Gynecology

## 2016-01-19 ENCOUNTER — Ambulatory Visit (INDEPENDENT_AMBULATORY_CARE_PROVIDER_SITE_OTHER): Payer: BLUE CROSS/BLUE SHIELD | Admitting: Obstetrics and Gynecology

## 2016-01-19 VITALS — BP 126/78 | HR 66 | Ht 67.0 in | Wt 205.2 lb

## 2016-01-19 DIAGNOSIS — Z113 Encounter for screening for infections with a predominantly sexual mode of transmission: Secondary | ICD-10-CM

## 2016-01-19 DIAGNOSIS — Z9889 Other specified postprocedural states: Secondary | ICD-10-CM

## 2016-01-19 DIAGNOSIS — Z7251 High risk heterosexual behavior: Secondary | ICD-10-CM

## 2016-01-19 MED ORDER — ULIPRISTAL ACETATE 30 MG PO TABS: 1.0000 | ORAL_TABLET | Freq: Once | ORAL | Status: DC

## 2016-01-19 NOTE — Progress Notes (Signed)
Patient ID: Julie Atkinson, female   DOB: 06-28-1971, 45 y.o.   MRN: [id number] GYNECOLOGY  VISIT   HPI: 45 y.o.   Single  Caucasian  female   [id number] with Patient's last menstrual period was 12/21/2015 (approximate).   here for 2 week follow up [place] (N/A ).   Path report - benign endometrial polyp.  No problems with bleeding or pain.  Patient would like to have STD testing and emergency contraception. Last contact with boyfriend was 4 days ago, and he states he has been unfaithful while overseas after they were intimate for several occasions.  GYNECOLOGIC HISTORY: Patient's last menstrual period was 12/21/2015 (approximate). Contraception: None Menopausal hormone therapy: none Last mammogram: 08-14-15 [facility]C/Neg/BiRads1:[facility] Last pap smear: 05-24-14 Neg:Neg HR HPV        OB History    Gravida Para Term Preterm AB TAB SAB Ectopic Multiple Living   3 1   2  1 1  1          There are no active problems to display for this patient.   Past Medical History  Diagnosis Date  . History of posttraumatic stress disorder (PTSD) 17-Feb-2008    due to maternal death-MI  . Depression   . Fibroid 02-16-2014    8 mm    Past Surgical History  Procedure Laterality Date  . Cesarean section  02/16/06    Dr Quincy Simmonds  . Bladder surgery  1978    nocturnal enuresis treatment  . Dilation and curettage of uterus  1990    MAB?  . Dilatation & curettage/hysteroscopy with myosure N/A 01/06/2016    Procedure: DILATATION & CURETTAGE/HYSTEROSCOPY WITH MYOSURE;  Surgeon: Nunzio Cobbs, MD;  Location: [facility];  Service: Gynecology;  Laterality: N/A;    Current Outpatient Prescriptions  Medication Sig Dispense Refill  . Cyanocobalamin (B-12 PO) Take 1 tablet by mouth every morning.    Marland Kitchen ibuprofen (ADVIL,MOTRIN) 800 MG tablet Take 1 tablet (800 mg total) by mouth every 8 (eight) hours as needed. 30 tablet 0  . Multiple Vitamin (MULTIVITAMIN) tablet  Take 1 tablet by mouth daily.    . Pyridoxine HCl (B-6 PO) Take 1 tablet by mouth daily.    . meloxicam (MOBIC) 7.5 MG tablet Reported on 01/19/2016     No current facility-administered medications for this visit.     ALLERGIES: Codeine  Family History  Problem Relation Age of Onset  . Pulmonary disease Mother   . Heart disease Mother   . Cancer - Cervical Mother     Cervical   . Cancer Maternal Aunt     "Female", unk   . Cancer Paternal Aunt     "Female" unk   . Cancer Maternal Grandmother     "Female", unk  . Diabetes Maternal Grandmother   . Cancer Paternal Grandmother     "Female"    Social History   Social History  . Marital Status: Single    Spouse Name: N/A  . Number of Children: N/A  . Years of Education: N/A   Occupational History  . Not on file.   Social History Main Topics  . Smoking status: Former Smoker    Quit date: 04/06/2014  . Smokeless tobacco: Never Used  . Alcohol Use: 0.6 - 3.6 oz/week    1-6 Glasses of wine per week  . Drug Use: No  . Sexual Activity:    Partners: Male    Patent examiner Protection: None  Other Topics Concern  . Not on file   Social History Narrative    ROS:  Pertinent items are noted in HPI.  PHYSICAL EXAMINATION:    BP 126/78 mmHg  Pulse 66  Ht 5\' 7"  (1.702 m)  Wt 205 lb 3.2 oz (93.078 kg)  BMI 32.13 kg/m2  LMP 12/21/2015 (Approximate)    General appearance: alert, cooperative and appears stated age   Pelvic: External genitalia:  no lesions              Urethra:  normal appearing urethra with no masses, tenderness or lesions              Bartholins and Skenes: normal                 Vagina: normal appearing vagina with normal color and discharge, no lesions.  Curd like discharge in vagina.              Cervix: no lesions.  No CMT.                Bimanual Exam:  Uterus: normal size and slightly tender.              Adnexa: normal adnexa and no mass, fullness, tenderness              Chaperone was  present for exam.  ASSESSMENT  Status post hysteroscopy with Myosure resection of polyp, dilation and curettage.  Desire for emergency contraception and STD testing.  PLAN  Counseled regarding pathology report from surgery.  STD testing performed;  HIV, RPR, Hep B and C, GC/CT, Affirm testing.  [facility] emergency contraception 30 mg.  Discussed mechanism of action, side effects, and proper use.  Will take as soon as possible.    An After Visit Summary was printed and given to the patient.  ___25___ minutes face to face time of which over 50% was spent in counseling.

## 2016-01-19 NOTE — Patient Instructions (Signed)
Ulipristal oral tablets What is this medicine? ULIPRISTAL (UE li pris tal) is an emergency contraceptive. It prevents pregnancy if taken within 5 days (120 hours) after your birth control fails or you have unprotected sex. This medicine will not work if you are already pregnant. This medicine may be used for other purposes; ask your health care provider or pharmacist if you have questions. What should I tell my health care provider before I take this medicine? They need to know if you have any of these conditions: -an unusual or allergic reaction to ulipristal, other medicines, foods, dyes, or preservatives -pregnant or trying to get pregnant -breast-feeding How should I use this medicine? Take this medicine by mouth with or without food. Your doctor may want you to use a quick-response pregnancy test prior to using the tablets. Take your medicine as soon as possible and not more than 5 days (120 hours) after the event. This medicine can be taken at any time during your menstrual cycle. Follow the dose instructions of your health care provider exactly. Contact your health care provider right away if you vomit within 3 hours of taking your medicine to discuss if you need to take another tablet. A patient package insert for the product will be given with each prescription and refill. Read this sheet carefully each time. The sheet may change frequently. Contact your pediatrician regarding the use of this medicine in children. Special care may be needed. Overdosage: If you think you have taken too much of this medicine contact a poison control center or emergency room at once. NOTE: This medicine is only for you. Do not share this medicine with others. What if I miss a dose? This does not apply; this medicine is not for regular use. What may interact with this medicine? This medicine may interact with the following medications: -birth control pills -bosentan -certain medicines for fungal infections  like griseofulvin, itraconazole, and ketoconazole -certain medicines for seizures like barbiturates, carbamazepine, felbamate, oxcarbazepine, phenytoin, topiramate -dabigatran -digoxin -rifampin -St. John's Wort This list may not describe all possible interactions. Give your health care provider a list of all the medicines, herbs, non-prescription drugs, or dietary supplements you use. Also tell them if you smoke, drink alcohol, or use illegal drugs. Some items may interact with your medicine. What should I watch for while using this medicine? Your period may begin a few days earlier or later than expected. If your period is more than 7 days late, pregnancy is possible. See your health care provider as soon as you can and get a pregnancy test. Talk to your healthcare provider before taking this medicine if you know or suspect that you are pregnant. Contact your healthcare provider if you think you may be pregnant and you have taken this medicine. Your healthcare provider may wish to provide information on your pregnancy to help study the safety of this medicine during pregnancy. For information, go to FreeTelegraph.it. If you have severe abdominal pain about 3 to 5 weeks after taking this medicine, you may have a pregnancy outside the womb, which is called an ectopic or tubal pregnancy. Call your health care provider or go to the nearest emergency room right away if you think this is happening. Discuss birth control options with your health care provider. Emergency birth control is not to be used routinely to prevent pregnancy. It should not be used more than once in the same cycle. Birth control pills may not work properly while you are taking this medicine. Wait  at least 5 days after taking this medicine to start or continue other hormone based birth control. Be sure to use a reliable barrier contraceptive method (such as a condom with spermicide) between the time you take this medicine and your next  period. This medicine does not protect you against HIV infection (AIDS) or any other sexually transmitted diseases (STDs). What side effects may I notice from receiving this medicine? Side effects that you should report to your doctor or health care professional as soon as possible: -allergic reactions like skin rash, itching or hives, swelling of the face, lips, or tongue Side effects that usually do not require medical attention (Report these to your doctor or health care professional if they continue or are bothersome.): -dizziness -headache -nausea -spotting -stomach pain -tiredness This list may not describe all possible side effects. Call your doctor for medical advice about side effects. You may report side effects to FDA at 1-800-FDA-1088. Where should I keep my medicine? Keep out of the reach of children. Store at between 20 and 25 degrees C (68 and 77 degrees F). Protect from light and keep in the blister card inside the original box until you are ready to take it. Throw away any unused medicine after the expiration date. NOTE: This sheet is a summary. It may not cover all possible information. If you have questions about this medicine, talk to your doctor, pharmacist, or health care provider.    2016, Elsevier/Gold Standard. (2014-01-21 14:39:01)

## 2016-01-20 LAB — WET PREP BY MOLECULAR PROBE

## 2016-01-20 LAB — HEPATITIS C ANTIBODY: HCV Ab: NEGATIVE

## 2016-01-20 LAB — GC/CHLAMYDIA PROBE AMP
CT Probe RNA: NOT DETECTED
GC Probe RNA: NOT DETECTED

## 2016-01-22 LAB — STD PANEL
HIV 1&2 Ab, 4th Generation: NONREACTIVE
Hepatitis B Surface Ag: NEGATIVE

## 2016-04-15 ENCOUNTER — Ambulatory Visit: Payer: BLUE CROSS/BLUE SHIELD

## 2016-04-15 ENCOUNTER — Encounter: Payer: Self-pay | Admitting: Physician Assistant

## 2016-04-15 ENCOUNTER — Ambulatory Visit (INDEPENDENT_AMBULATORY_CARE_PROVIDER_SITE_OTHER): Payer: BLUE CROSS/BLUE SHIELD

## 2016-04-15 ENCOUNTER — Ambulatory Visit (INDEPENDENT_AMBULATORY_CARE_PROVIDER_SITE_OTHER): Payer: BLUE CROSS/BLUE SHIELD | Admitting: Physician Assistant

## 2016-04-15 VITALS — BP 120/78 | HR 73 | Temp 98.6°F | Resp 16 | Ht 67.5 in | Wt 189.4 lb

## 2016-04-15 DIAGNOSIS — M25551 Pain in right hip: Secondary | ICD-10-CM

## 2016-04-15 DIAGNOSIS — L989 Disorder of the skin and subcutaneous tissue, unspecified: Secondary | ICD-10-CM | POA: Diagnosis not present

## 2016-04-15 IMAGING — CR DG HIP (WITH OR WITHOUT PELVIS) 2-3V*R*
2 series · 2 of 2 positions shown · non-contrast
Comparison: None

CLINICAL DATA: Right hip pain for 1 year.

EXAM:
DG HIP (WITH OR WITHOUT PELVIS) 2-3V RIGHT

[AP]
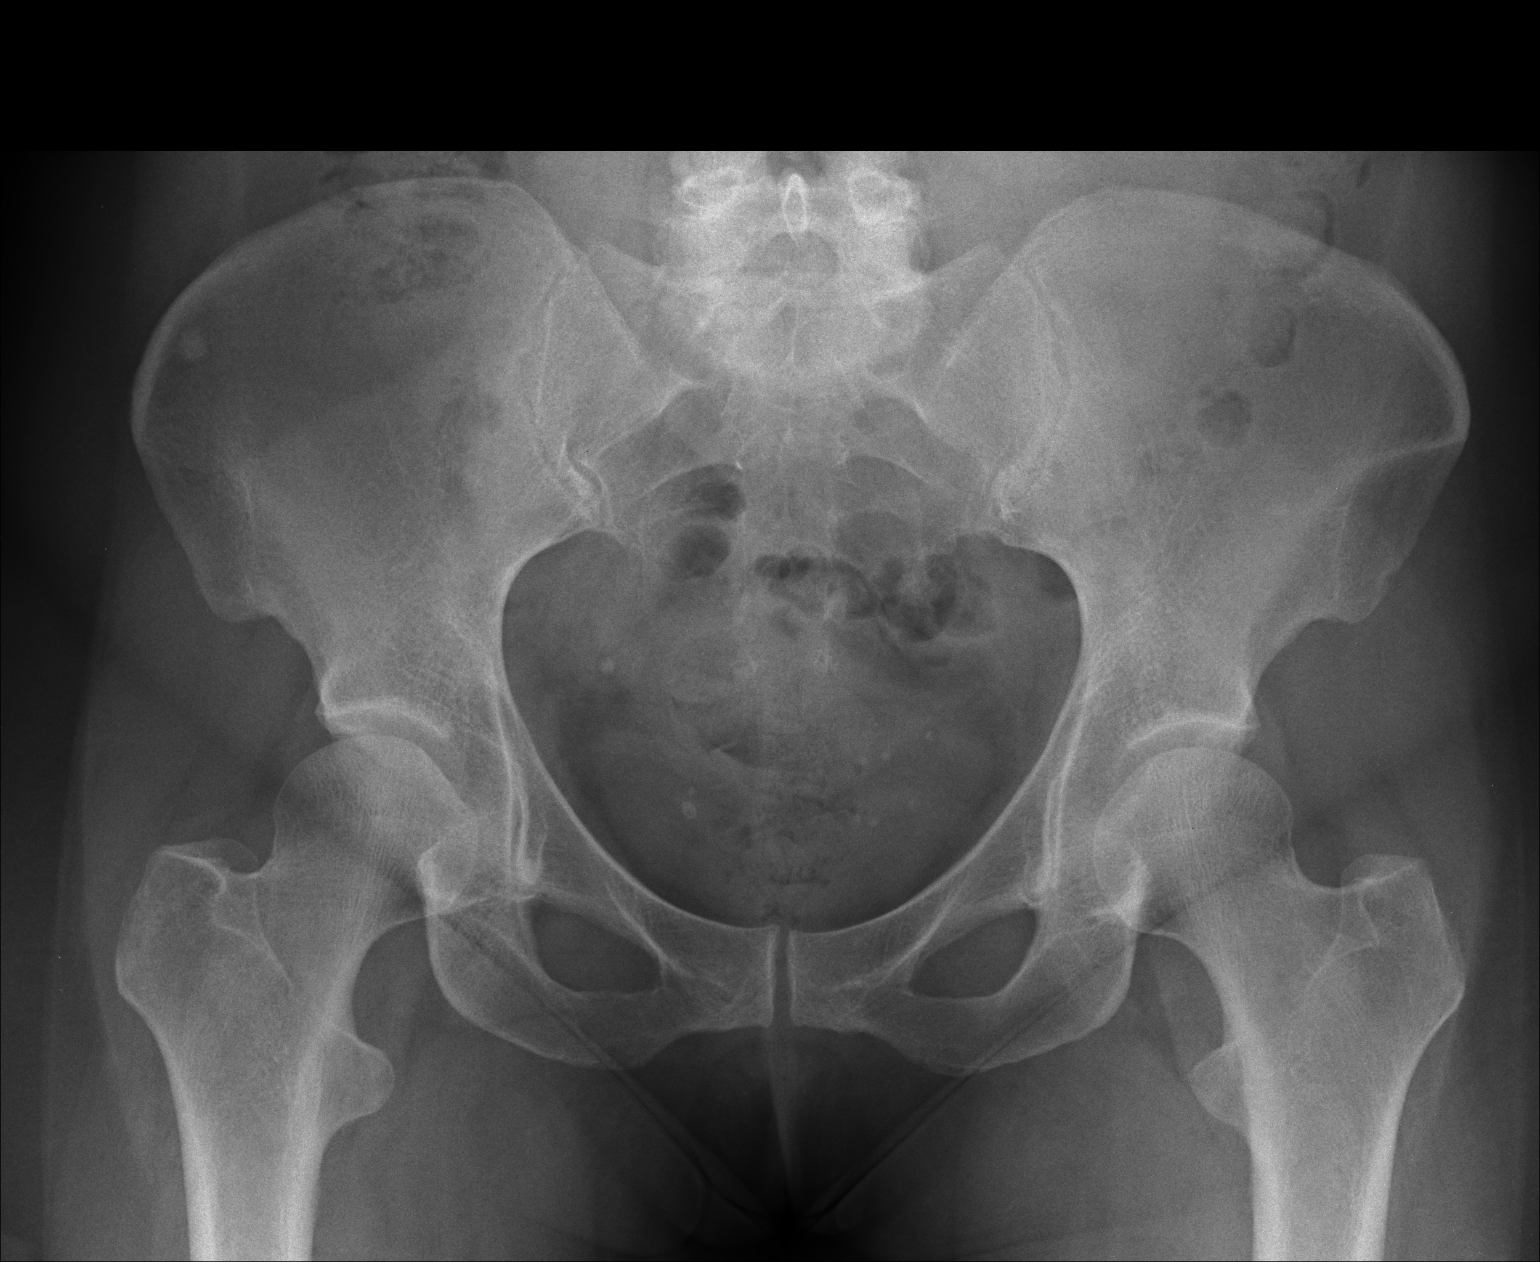

[lateral]
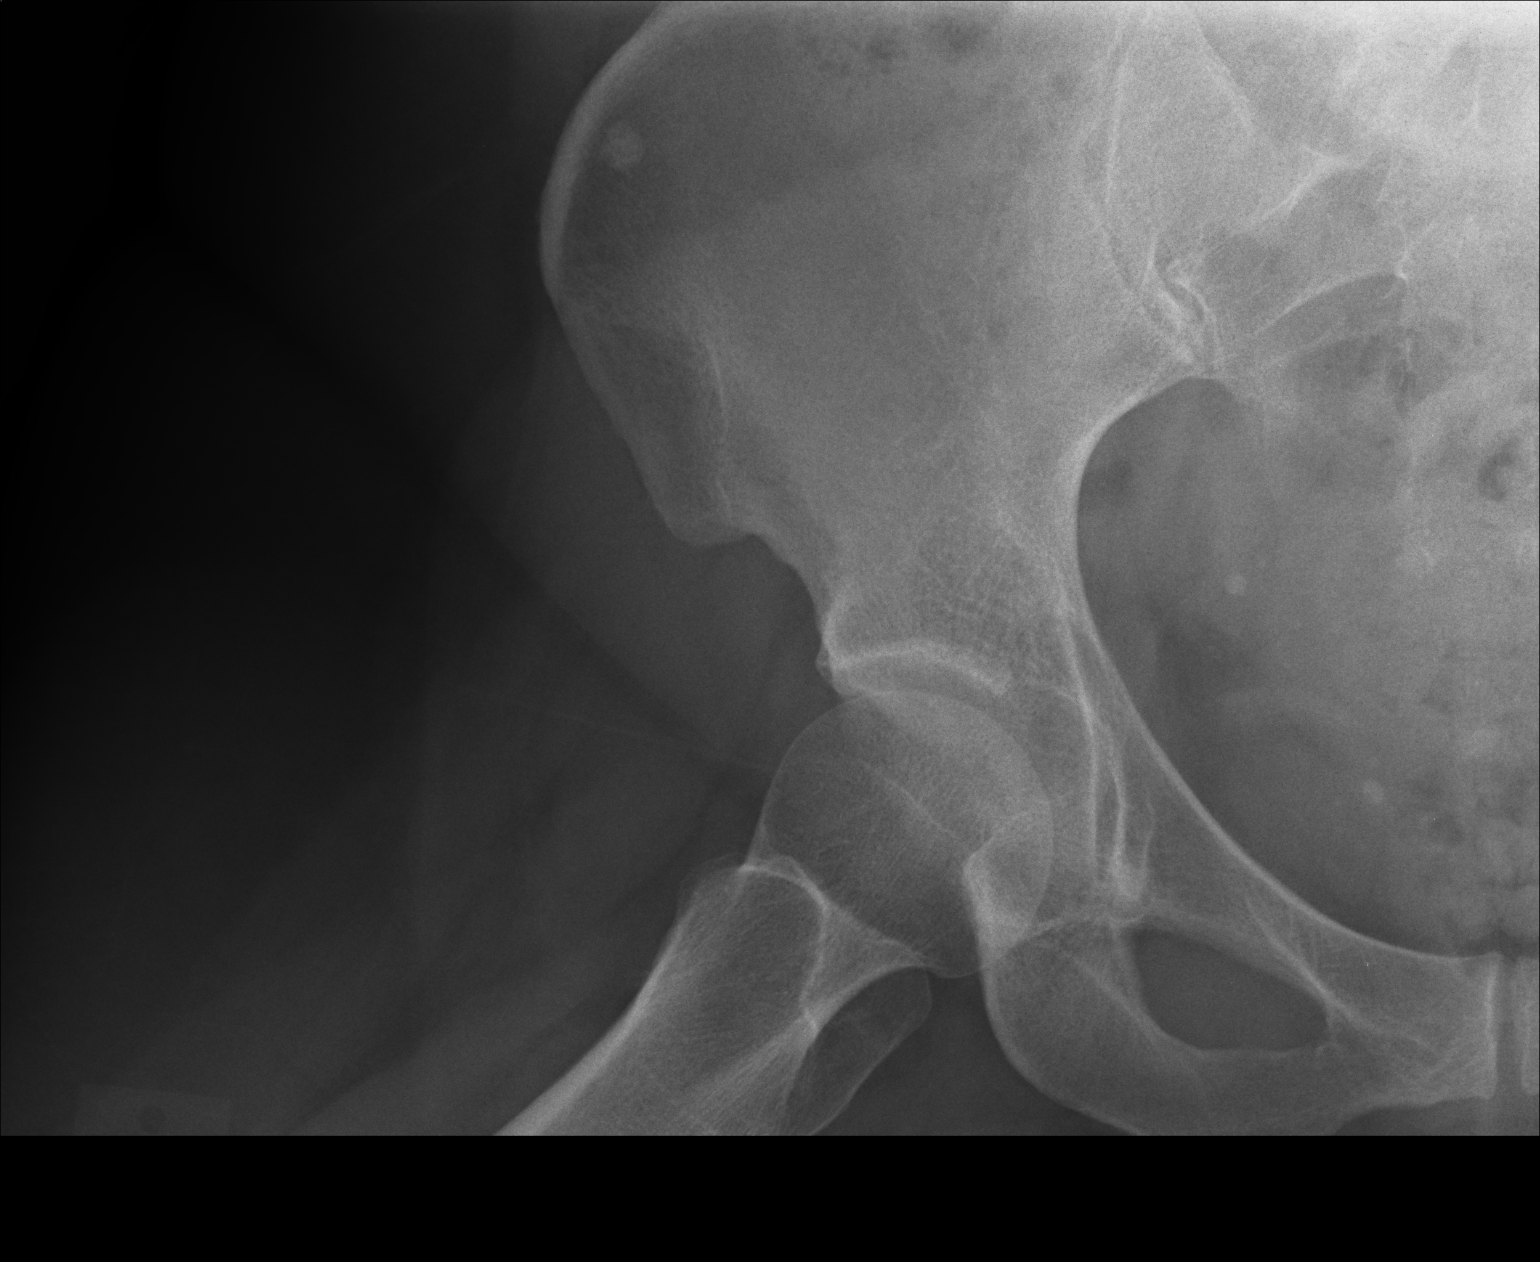

[2 of 2 positions shown; findings below may reference images not displayed]

FINDINGS: There is no evidence of hip fracture or dislocation. There is no
evidence of arthropathy or other focal bone abnormality. Bone island
noted within the right iliac wing.
IMPRESSION: 1. No acute findings.
2. Right iliac wing bone island

## 2016-04-15 NOTE — Patient Instructions (Signed)
     IF you received an x-ray today, you will receive an invoice from [facility]. Please contact [facility] at [phone] with questions or concerns regarding your invoice.   IF you received labwork today, you will receive an invoice from [facility]. Please contact [facility] at [phone] with questions or concerns regarding your invoice.   Our billing staff will not be able to assist you with questions regarding bills from these companies.  You will be contacted with the lab results as soon as they are available. The fastest way to get your results is to activate your My Chart account. Instructions are located on the last page of this paperwork. If you have not heard from us regarding the results in 2 weeks, please contact this office.      

## 2016-04-15 NOTE — Progress Notes (Signed)
Julie Atkinson  MRN: [id number] DOB: 10/20/1971  Subjective:  Pt presents to clinic with several concerns 1- a mole on her left back - it irritates her from her bra and she would like it removed 2- episode about 2 weeks ago at the gym where she was doing a timed hand stand and she was concentration on her form and when she stood up from it she felt like she had tunnel vision and like she was going to pass out but never really did - it lasted about 45 mins and then it resolved she felt like as time went on though it did get worse - she has worked out since then and she has not had the same sensation since then - she does not typically do handstands but she wanted to try it - she has h/o optic migraines a long time ago and during the episode she did not have any headache - she has h/o anxiety and she wonders if that may have played a role once the symptoms started but she cannot be sure. She does feel fine now but her trainer wanted her to ask about what happened to her.  She had eaten and drank normally prior to the episode.  She did not have her BP and pulse checked but she does remember trying to feel her pulse but she cannot remember whether it was fast or not. 3- right hip pain for the last 6-12 months - it has stopped her from working out over the last few days and since she has stopped working out her pain has gotten much better.  It hurts the most in a cross legged position - she will have pain sometimes radiate into her low back but never into her thigh and she has no paresthesias with the pain.  She has not taken any medications for the pain.  There are no active problems to display for this patient.   Current Outpatient Prescriptions on File Prior to Visit  Medication Sig Dispense Refill  . Cyanocobalamin (B-12 PO) Take 1 tablet by mouth every morning.    Marland Kitchen ibuprofen (ADVIL,MOTRIN) 800 MG tablet Take 1 tablet (800 mg total) by mouth every 8 (eight) hours as needed. 30 tablet 0  . Multiple  Vitamin (MULTIVITAMIN) tablet Take 1 tablet by mouth daily.    . Pyridoxine HCl (B-6 PO) Take 1 tablet by mouth daily. Reported on 04/15/2016     No current facility-administered medications on file prior to visit.    Allergies  Allergen Reactions  . Codeine Nausea And Vomiting    Review of Systems  Musculoskeletal: Positive for back pain. Negative for gait problem.  Neurological: Negative for syncope, weakness and headaches.   Objective:  BP 120/78 mmHg  Pulse 73  Temp(Src) 98.6 F (37 C) (Oral)  Resp 16  Ht 5' 7.5" (1.715 m)  Wt 189 lb 6.4 oz (85.911 kg)  BMI 29.21 kg/m2  SpO2 98%  LMP 04/11/2016  Physical Exam  Constitutional: She is oriented to person, place, and time and well-developed, well-nourished, and in no distress.  HENT:  Head: Normocephalic and atraumatic.  Right Ear: Hearing and external ear normal.  Left Ear: Hearing and external ear normal.  Eyes: Conjunctivae are normal.  Neck: Normal range of motion.  Pulmonary/Chest: Effort normal.  Musculoskeletal:       Right hip: She exhibits tenderness. She exhibits normal range of motion and normal strength.       Left hip: Normal.  Legs: -pain with flexion of knee and resistance but no pain with extension and resistance -no pain with hip extension or flexion ad resistance -pain with hip abduction but not with adduction -pain with hip external rotation but not internal rotation - no TTP over sciatic nerve, no TTP over SI joint, no TTP over trochanteric bursa  Neurological: She is alert and oriented to person, place, and time. Gait normal.  Skin: Skin is warm and dry.     Psychiatric: Mood, memory, affect and judgment normal.  Vitals reviewed.  Dg Hip Unilat W Or W/o Pelvis 2-3 Views Right  04/15/2016  CLINICAL DATA:  Right hip pain for 1 year. EXAM: DG HIP (WITH OR WITHOUT PELVIS) 2-3V RIGHT COMPARISON:  None FINDINGS: There is no evidence of hip fracture or dislocation. There is no evidence of  arthropathy or other focal bone abnormality. Bone island noted within the right iliac wing. IMPRESSION: 1. No acute findings. 2. Right iliac wing bone island Electronically Signed   By: Kerby Moors M.D.   On: 04/15/2016 16:33    Assessment and Plan :  Right hip pain - Plan: DG HIP UNILAT W OR W/O PELVIS 2-3 VIEWS RIGHT, Ambulatory referral to Physical Therapy - ? extensor hip irritation esp with her radiation to her low back - she will start PT as her xrays do no indicate a bony cause of her pain  Skin lesion of back - seems consistent with seborrhea keratosis - she will schedule an additional appt for shave removal   D/w pt that the episode she had at the gyn sounds like a vasovagal response likely to her holding her breathing while concentrating on the handstand and then her quickly changing to an upright position - she had no pain and she has had h/o optic migraines in the past so this is a possibility - she will continue to work out and make sure she has hydrated and nourished herself prior to exercise and she will be mindful of good breathing techniques.  Windell Hummingbird PA-C  [facility] 04/15/2016 5:50 PM

## 2016-06-04 ENCOUNTER — Telehealth: Payer: Self-pay | Admitting: Obstetrics and Gynecology

## 2016-06-09 ENCOUNTER — Ambulatory Visit: Payer: BLUE CROSS/BLUE SHIELD | Admitting: Obstetrics and Gynecology

## 2016-06-17 ENCOUNTER — Ambulatory Visit: Payer: BLUE CROSS/BLUE SHIELD | Admitting: Obstetrics and Gynecology

## 2016-07-02 NOTE — Progress Notes (Signed)
45 y.o. [id number] Single Caucasian female here for annual exam.    Menses are heavy for first two days and then stops.  No intermenstrual bleeding.   Patient would like to be tested for STDs. She recently caught boyfriend with someone else. Some discomfort with intercourse and increased discharge.   Patient has a known positive HSV I testing from many years ago.  Denies cold sores.   Has been to [place], [place], and all over the [place] from [place] to [place].  Planning to go to [place].   PCP:  Dr. [name]  Patient's last menstrual period was 07/05/2016 (exact date).     Period Cycle (Days): 30 Period Duration (Days): 3-4 Period Pattern: Regular Menstrual Flow:  (first 2 days heavy then 2 light days) Menstrual Control: Tampon, Maxi pad Menstrual Control Change Freq (Hours): every 3 hrs on heaviest day Dysmenorrhea: (!) Moderate Dysmenorrhea Symptoms: Cramping, Diarrhea     Sexually active: Yes.  female  The current method of family planning is none.    Exercising: Yes.    Cardio and crossfit daily Smoker:  no  Health Maintenance: Pap:  05-24-14 Neg:Neg HR HPV History of abnormal Pap:  no MMG:  08-14-15 Density C/Neg/BiRads1:[facility] Colonoscopy:  n/a BMD:   n/a  Result  n/a TDaP:  06-06-15 Gardasil:   N/A HIV:  today Hep C:  today Screening Labs:  Hb today: 13.4, Urine today: Neg   reports that she quit smoking about 2 years ago. She has never used smokeless tobacco. She reports that she drinks about 2.4 oz of alcohol per week . She reports that she does not use drugs.  Past Medical History:  Diagnosis Date  . Depression   . Fibroid 2015   8 mm  . History of posttraumatic stress disorder (PTSD) 02-08-08   due to maternal death-MI    Past Surgical History:  Procedure Laterality Date  . BLADDER SURGERY  1978   nocturnal enuresis treatment  . CESAREAN SECTION  07-Feb-2006   Dr Quincy Simmonds  . DILATATION & CURETTAGE/HYSTEROSCOPY WITH MYOSURE N/A 01/06/2016   Procedure:  DILATATION & CURETTAGE/HYSTEROSCOPY WITH MYOSURE;  Surgeon: Nunzio Cobbs, MD;  Location: [facility];  Service: Gynecology;  Laterality: N/A;  . DILATION AND CURETTAGE OF UTERUS  1990   MAB?    Current Outpatient Prescriptions  Medication Sig Dispense Refill  . Cyanocobalamin (B-12 PO) Take 1 tablet by mouth every morning.    Marland Kitchen ibuprofen (ADVIL,MOTRIN) 800 MG tablet Take 1 tablet (800 mg total) by mouth every 8 (eight) hours as needed. 30 tablet 0  . Multiple Vitamin (MULTIVITAMIN) tablet Take 1 tablet by mouth daily.    . Pyridoxine HCl (B-6 PO) Take 1 tablet by mouth daily. Reported on 04/15/2016     No current facility-administered medications for this visit.     Family History  Problem Relation Age of Onset  . Pulmonary disease Mother   . Heart disease Mother   . Cancer - Cervical Mother     Cervical   . Cancer Maternal Aunt     "Female", unk   . Cancer Paternal Aunt     "Female" unk   . Cancer Maternal Grandmother     "Female", unk  . Diabetes Maternal Grandmother   . Cancer Paternal Grandmother     "Female"    ROS:  Pertinent items are noted in HPI.  Otherwise, a comprehensive ROS was negative.  Exam:   BP 108/70 (BP Location: Right  Arm, Patient Position: Sitting, Cuff Size: Normal)   Pulse 70   Resp 18   Ht 5' 6.75" (1.695 m)   Wt 195 lb 9.6 oz (88.7 kg)   LMP 07/05/2016 (Exact Date)   BMI 30.87 kg/m     General appearance: alert, cooperative and appears stated age Head: Normocephalic, without obvious abnormality, atraumatic Neck: no adenopathy, supple, symmetrical, trachea midline and thyroid normal to inspection and palpation Lungs: clear to auscultation bilaterally Breasts: normal appearance, no masses or tenderness, No nipple retraction or dimpling, No nipple discharge or bleeding, No axillary or supraclavicular adenopathy Heart: regular rate and rhythm Abdomen: soft, non-tender; no masses, no organomegaly Extremities: extremities normal, atraumatic,  no cyanosis or edema Skin: Skin color, texture, turgor normal. No rashes or lesions Lymph nodes: Cervical, supraclavicular, and axillary nodes normal. No abnormal inguinal nodes palpated Neurologic: Grossly normal  Pelvic: External genitalia:  no lesions              Urethra:  normal appearing urethra with no masses, tenderness or lesions              Bartholins and Skenes: normal                 Vagina: normal appearing vagina with normal color and discharge, no lesions              Cervix: no lesions              Pap taken: No. Bimanual Exam:  Uterus:  normal size, contour, position, consistency, mobility, non-tender              Adnexa: no mass, fullness, tenderness              Rectal exam: Yes.  .  Confirms.              Anus:  normal sphincter tone, no lesions  Chaperone was present for exam.  Assessment:   Well woman visit with normal exam. Desire for STD testing.   Plan: Yearly mammogram recommended after age 81.   Patient will schedule mammogram.  Recommended self breast exam.  Pap and HR HPV as above. Discussed Calcium, Vitamin D, regular exercise program including cardiovascular and weight bearing exercise. Routine labs and comprehensive STD testing.  Follow up annually and prn.      After visit summary provided.

## 2016-07-05 DIAGNOSIS — M26632 Articular disc disorder of left temporomandibular joint: Secondary | ICD-10-CM | POA: Diagnosis not present

## 2016-07-05 DIAGNOSIS — M26622 Arthralgia of left temporomandibular joint: Secondary | ICD-10-CM | POA: Diagnosis not present

## 2016-07-05 DIAGNOSIS — M26213 Malocclusion, Angle's class III: Secondary | ICD-10-CM | POA: Diagnosis not present

## 2016-07-14 ENCOUNTER — Ambulatory Visit (INDEPENDENT_AMBULATORY_CARE_PROVIDER_SITE_OTHER): Payer: BLUE CROSS/BLUE SHIELD | Admitting: Obstetrics and Gynecology

## 2016-07-14 ENCOUNTER — Encounter: Payer: Self-pay | Admitting: Obstetrics and Gynecology

## 2016-07-14 ENCOUNTER — Other Ambulatory Visit: Payer: Self-pay | Admitting: Obstetrics and Gynecology

## 2016-07-14 VITALS — BP 108/70 | HR 70 | Resp 18 | Ht 66.75 in | Wt 195.6 lb

## 2016-07-14 DIAGNOSIS — Z0000 Encounter for general adult medical examination without abnormal findings: Secondary | ICD-10-CM

## 2016-07-14 DIAGNOSIS — Z113 Encounter for screening for infections with a predominantly sexual mode of transmission: Secondary | ICD-10-CM

## 2016-07-14 DIAGNOSIS — Z01419 Encounter for gynecological examination (general) (routine) without abnormal findings: Secondary | ICD-10-CM

## 2016-07-14 LAB — POCT URINALYSIS DIPSTICK
Bilirubin, UA: NEGATIVE
Blood, UA: NEGATIVE
Glucose, UA: NEGATIVE
Ketones, UA: NEGATIVE
Leukocytes, UA: NEGATIVE
Nitrite, UA: NEGATIVE
Protein, UA: NEGATIVE
Urobilinogen, UA: NEGATIVE
pH, UA: 6

## 2016-07-14 LAB — CBC
HCT: 39.4 % (ref 35.0–45.0)
Hemoglobin: 13.1 g/dL (ref 11.7–15.5)
MCH: 31.7 pg (ref 27.0–33.0)
MCHC: 33.2 g/dL (ref 32.0–36.0)
MCV: 95.4 fL (ref 80.0–100.0)
MPV: 10.5 fL (ref 7.5–12.5)
Platelets: 187 10*3/uL (ref 140–400)
RBC: 4.13 MIL/uL (ref 3.80–5.10)
RDW: 13.4 % (ref 11.0–15.0)
WBC: 7 10*3/uL (ref 3.8–10.8)

## 2016-07-14 LAB — HEMOGLOBIN, FINGERSTICK: Hemoglobin, fingerstick: 13.4 g/dL (ref 12.0–16.0)

## 2016-07-14 NOTE — Patient Instructions (Signed)

## 2016-07-15 LAB — STD PANEL
HIV 1&2 Ab, 4th Generation: NONREACTIVE
Hepatitis B Surface Ag: NEGATIVE

## 2016-07-15 LAB — COMPREHENSIVE METABOLIC PANEL
ALT: 10 U/L (ref 6–29)
AST: 13 U/L (ref 10–35)
Albumin: 4.2 g/dL (ref 3.6–5.1)
Alkaline Phosphatase: 40 U/L (ref 33–115)
BUN: 13 mg/dL (ref 7–25)
CO2: 24 mmol/L (ref 20–31)
Calcium: 9.1 mg/dL (ref 8.6–10.2)
Chloride: 103 mmol/L (ref 98–110)
Creat: 0.76 mg/dL (ref 0.50–1.10)
Glucose, Bld: 84 mg/dL (ref 65–99)
Potassium: 4.2 mmol/L (ref 3.5–5.3)
Sodium: 137 mmol/L (ref 135–146)
Total Bilirubin: 0.5 mg/dL (ref 0.2–1.2)
Total Protein: 7.2 g/dL (ref 6.1–8.1)

## 2016-07-15 LAB — WET PREP BY MOLECULAR PROBE
Candida species: NEGATIVE
Gardnerella vaginalis: NEGATIVE
Trichomonas vaginosis: NEGATIVE

## 2016-07-15 LAB — LIPID PANEL
Cholesterol: 199 mg/dL (ref 125–200)
HDL: 125 mg/dL (ref >=46)
LDL Cholesterol: 47 mg/dL (ref <130)
Total CHOL/HDL Ratio: 1.6 Ratio (ref <=5.0)
Triglycerides: 134 mg/dL (ref <150)
VLDL: 27 mg/dL (ref <30)

## 2016-07-15 LAB — GC/CHLAMYDIA PROBE AMP
CT Probe RNA: NOT DETECTED
GC Probe RNA: NOT DETECTED

## 2016-07-15 LAB — TSH: TSH: 1.88 mIU/L

## 2016-07-15 LAB — HEPATITIS C ANTIBODY: HCV Ab: NEGATIVE

## 2016-07-19 LAB — HSV(HERPES SMPLX)ABS-I+II(IGG+IGM)-BLD
HSV 1 Glycoprotein G Ab, IgG: <0.9 Index (ref <0.90)
HSV 2 Glycoprotein G Ab, IgG: 14.3 Index — ABNORMAL HIGH (ref <0.90)
Herpes Simplex Vrs I&II-IgM Ab (EIA): 1.19 INDEX — ABNORMAL HIGH

## 2016-07-20 ENCOUNTER — Encounter: Payer: Self-pay | Admitting: Obstetrics and Gynecology

## 2016-07-26 DIAGNOSIS — F4323 Adjustment disorder with mixed anxiety and depressed mood: Secondary | ICD-10-CM | POA: Diagnosis not present

## 2016-08-26 ENCOUNTER — Other Ambulatory Visit: Payer: Self-pay | Admitting: Obstetrics and Gynecology

## 2016-08-26 DIAGNOSIS — Z1231 Encounter for screening mammogram for malignant neoplasm of breast: Secondary | ICD-10-CM

## 2016-09-01 DIAGNOSIS — Z1231 Encounter for screening mammogram for malignant neoplasm of breast: Secondary | ICD-10-CM | POA: Diagnosis not present

## 2016-09-02 ENCOUNTER — Ambulatory Visit (INDEPENDENT_AMBULATORY_CARE_PROVIDER_SITE_OTHER): Payer: BLUE CROSS/BLUE SHIELD | Admitting: Physician Assistant

## 2016-09-02 VITALS — BP 116/88 | HR 75 | Temp 98.3°F | Resp 16 | Ht 67.0 in | Wt 198.6 lb

## 2016-09-02 DIAGNOSIS — B9789 Other viral agents as the cause of diseases classified elsewhere: Secondary | ICD-10-CM

## 2016-09-02 DIAGNOSIS — G4700 Insomnia, unspecified: Secondary | ICD-10-CM

## 2016-09-02 DIAGNOSIS — Z23 Encounter for immunization: Secondary | ICD-10-CM | POA: Diagnosis not present

## 2016-09-02 DIAGNOSIS — D225 Melanocytic nevi of trunk: Secondary | ICD-10-CM

## 2016-09-02 DIAGNOSIS — J069 Acute upper respiratory infection, unspecified: Secondary | ICD-10-CM | POA: Diagnosis not present

## 2016-09-02 DIAGNOSIS — F4310 Post-traumatic stress disorder, unspecified: Secondary | ICD-10-CM | POA: Insufficient documentation

## 2016-09-02 DIAGNOSIS — D235 Other benign neoplasm of skin of trunk: Secondary | ICD-10-CM | POA: Diagnosis not present

## 2016-09-02 MED ORDER — VENLAFAXINE HCL ER 75 MG PO CP24: 75.0000 mg | ORAL_CAPSULE | Freq: Every day | ORAL | 3 refills | Status: DC

## 2016-09-02 MED ORDER — MELOXICAM 15 MG PO TABS: 15.0000 mg | ORAL_TABLET | Freq: Every day | ORAL | 1 refills | Status: DC

## 2016-09-02 MED ORDER — VENLAFAXINE HCL ER 37.5 MG PO CP24: 37.5000 mg | ORAL_CAPSULE | Freq: Every day | ORAL | 0 refills | Status: DC

## 2016-09-02 MED ORDER — BENZONATATE 100 MG PO CAPS: 100.0000 mg | ORAL_CAPSULE | Freq: Three times a day (TID) | ORAL | 0 refills | Status: DC | PRN

## 2016-09-02 MED ORDER — CLONAZEPAM 1 MG PO TABS: 0.5000 mg | ORAL_TABLET | Freq: Two times a day (BID) | ORAL | 0 refills | Status: DC | PRN
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
## 2016-09-02 NOTE — Patient Instructions (Signed)
     IF you received an x-ray today, you will receive an invoice from [facility]. Please contact [facility] at [phone] with questions or concerns regarding your invoice.   IF you received labwork today, you will receive an invoice from [facility]. Please contact [facility] at [phone] with questions or concerns regarding your invoice.   Our billing staff will not be able to assist you with questions regarding bills from these companies.  You will be contacted with the lab results as soon as they are available. The fastest way to get your results is to activate your My Chart account. Instructions are located on the last page of this paperwork. If you have not heard from us regarding the results in 2 weeks, please contact this office.      

## 2016-09-07 ENCOUNTER — Ambulatory Visit: Payer: Self-pay

## 2016-09-16 ENCOUNTER — Encounter: Payer: Self-pay | Admitting: Obstetrics and Gynecology

## 2016-11-19 ENCOUNTER — Emergency Department (HOSPITAL_COMMUNITY)
Admission: EM | Admit: 2016-11-19 | Discharge: 2016-11-19 | Disposition: A | Discharge status: Home / Self Care | Payer: No Typology Code available for payment source | Attending: Emergency Medicine | Admitting: Emergency Medicine

## 2016-11-19 ENCOUNTER — Encounter (HOSPITAL_COMMUNITY): Payer: Self-pay | Admitting: Emergency Medicine

## 2016-11-19 DIAGNOSIS — Y939 Activity, unspecified: Secondary | ICD-10-CM | POA: Diagnosis not present

## 2016-11-19 DIAGNOSIS — Z79899 Other long term (current) drug therapy: Secondary | ICD-10-CM | POA: Insufficient documentation

## 2016-11-19 DIAGNOSIS — M545 Low back pain: Secondary | ICD-10-CM | POA: Insufficient documentation

## 2016-11-19 DIAGNOSIS — Y999 Unspecified external cause status: Secondary | ICD-10-CM | POA: Insufficient documentation

## 2016-11-19 DIAGNOSIS — Y92410 Unspecified street and highway as the place of occurrence of the external cause: Secondary | ICD-10-CM | POA: Diagnosis not present

## 2016-11-19 DIAGNOSIS — Z87891 Personal history of nicotine dependence: Secondary | ICD-10-CM | POA: Diagnosis not present

## 2016-11-19 MED ORDER — NAPROXEN 500 MG PO TABS: 500.0000 mg | ORAL_TABLET | Freq: Two times a day (BID) | ORAL | 0 refills | Status: DC

## 2016-11-19 MED ORDER — METHOCARBAMOL 500 MG PO TABS: 500.0000 mg | ORAL_TABLET | Freq: Every evening | ORAL | 0 refills | Status: DC | PRN
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
## 2016-12-09 ENCOUNTER — Telehealth: Payer: Self-pay

## 2016-12-09 MED ORDER — OSELTAMIVIR PHOSPHATE 75 MG PO CAPS: 75.0000 mg | ORAL_CAPSULE | Freq: Every day | ORAL | 0 refills | Status: DC

## 2016-12-09 NOTE — Telephone Encounter (Signed)
Meds ordered this encounter  Medications  . oseltamivir (TAMIFLU) 75 MG capsule    Sig: Take 1 capsule (75 mg total) by mouth daily.    Dispense:  10 capsule    Refill:  0    Order Specific Question:   Supervising Provider    Answer:   Brigitte Pulse, EVA N [[id number]]    Notified patient by phone.

## 2016-12-09 NOTE — Telephone Encounter (Signed)
Pt son has been diagnosed with the flu and she would llike tamaflu for herself   Best number [phone]

## 2016-12-09 NOTE — Telephone Encounter (Signed)
Please advise if she is a candidate for tamiflu

## 2016-12-30 ENCOUNTER — Ambulatory Visit: Payer: BLUE CROSS/BLUE SHIELD | Admitting: Physician Assistant

## 2017-02-17 DIAGNOSIS — Z0000 Encounter for general adult medical examination without abnormal findings: Secondary | ICD-10-CM | POA: Diagnosis not present

## 2017-02-17 DIAGNOSIS — E669 Obesity, unspecified: Secondary | ICD-10-CM | POA: Diagnosis not present

## 2017-02-17 DIAGNOSIS — Z1389 Encounter for screening for other disorder: Secondary | ICD-10-CM | POA: Diagnosis not present

## 2017-03-23 DIAGNOSIS — E669 Obesity, unspecified: Secondary | ICD-10-CM | POA: Diagnosis not present

## 2017-04-25 DIAGNOSIS — E663 Overweight: Secondary | ICD-10-CM | POA: Diagnosis not present

## 2017-07-21 DIAGNOSIS — B373 Candidiasis of vulva and vagina: Secondary | ICD-10-CM | POA: Diagnosis not present

## 2017-07-21 DIAGNOSIS — J018 Other acute sinusitis: Secondary | ICD-10-CM | POA: Diagnosis not present

## 2017-07-21 DIAGNOSIS — J209 Acute bronchitis, unspecified: Secondary | ICD-10-CM | POA: Diagnosis not present

## 2017-08-05 ENCOUNTER — Ambulatory Visit: Payer: BLUE CROSS/BLUE SHIELD | Admitting: Obstetrics and Gynecology

## 2017-08-05 ENCOUNTER — Encounter: Payer: Self-pay | Admitting: Obstetrics and Gynecology

## 2017-08-05 ENCOUNTER — Telehealth: Payer: Self-pay | Admitting: Obstetrics and Gynecology

## 2017-08-05 NOTE — Telephone Encounter (Signed)
Patient cancelled and rescheduled appointment today because her son is sick. She is aware of fee.

## 2017-08-05 NOTE — Telephone Encounter (Signed)
Thank you for the update!

## 2017-08-10 ENCOUNTER — Ambulatory Visit: Payer: BLUE CROSS/BLUE SHIELD | Admitting: Obstetrics and Gynecology

## 2017-08-10 ENCOUNTER — Telehealth: Payer: Self-pay | Admitting: Obstetrics and Gynecology

## 2017-08-10 NOTE — Telephone Encounter (Signed)
Thank you for the update!
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
## 2017-08-10 NOTE — Telephone Encounter (Signed)
Patient called and cancelled her 12:45pm appointment.  States she started her cycle.

## 2017-09-02 ENCOUNTER — Telehealth: Payer: Self-pay

## 2017-09-02 NOTE — Telephone Encounter (Signed)
Call to patient reminding her to get her flu shot.  LMVM.

## 2017-09-20 DIAGNOSIS — R05 Cough: Secondary | ICD-10-CM | POA: Diagnosis not present

## 2017-09-20 DIAGNOSIS — J018 Other acute sinusitis: Secondary | ICD-10-CM | POA: Diagnosis not present

## 2017-09-20 DIAGNOSIS — J40 Bronchitis, not specified as acute or chronic: Secondary | ICD-10-CM | POA: Diagnosis not present

## 2017-09-27 ENCOUNTER — Other Ambulatory Visit: Payer: Self-pay | Admitting: Physician Assistant

## 2017-09-27 DIAGNOSIS — G4700 Insomnia, unspecified: Secondary | ICD-10-CM

## 2017-09-27 NOTE — Telephone Encounter (Signed)
Pt previously seen by Harrison Mons requesting refill for mobic written 08/2016; no recent bmet; can this prescription be filled or does pt need to be seen in office first?

## 2017-09-27 NOTE — Telephone Encounter (Signed)
Please advise as pt hasn't been seen since 08/2016

## 2017-09-27 NOTE — Telephone Encounter (Signed)
Patient needs re-evaluation if needs refill, due to >12 months since last evaluation.

## 2018-01-18 DIAGNOSIS — F4322 Adjustment disorder with anxiety: Secondary | ICD-10-CM | POA: Diagnosis not present

## 2018-02-09 ENCOUNTER — Encounter: Payer: Self-pay | Admitting: Physician Assistant

## 2018-03-03 ENCOUNTER — Other Ambulatory Visit: Payer: Self-pay

## 2018-03-03 ENCOUNTER — Encounter: Payer: Self-pay | Admitting: Family Medicine

## 2018-03-03 ENCOUNTER — Ambulatory Visit: Payer: BLUE CROSS/BLUE SHIELD | Admitting: Family Medicine

## 2018-03-03 VITALS — BP 130/82 | HR 90 | Temp 98.8°F | Ht 67.5 in | Wt 183.6 lb

## 2018-03-03 DIAGNOSIS — F4310 Post-traumatic stress disorder, unspecified: Secondary | ICD-10-CM

## 2018-03-03 DIAGNOSIS — R062 Wheezing: Secondary | ICD-10-CM | POA: Diagnosis not present

## 2018-03-03 DIAGNOSIS — M7062 Trochanteric bursitis, left hip: Secondary | ICD-10-CM | POA: Diagnosis not present

## 2018-03-03 DIAGNOSIS — M2550 Pain in unspecified joint: Secondary | ICD-10-CM | POA: Diagnosis not present

## 2018-03-03 MED ORDER — CLONAZEPAM 0.5 MG PO TABS: 0.5000 mg | ORAL_TABLET | Freq: Every day | ORAL | 0 refills | Status: DC | PRN

## 2018-03-03 MED ORDER — ALBUTEROL SULFATE HFA 108 (90 BASE) MCG/ACT IN AERS: 2.0000 | INHALATION_SPRAY | RESPIRATORY_TRACT | 3 refills | Status: DC | PRN

## 2018-03-03 MED ORDER — FLUOXETINE HCL 20 MG PO TABS: 20.0000 mg | ORAL_TABLET | Freq: Every day | ORAL | 3 refills | Status: DC

## 2018-03-03 MED ORDER — TRAZODONE HCL 50 MG PO TABS: 25.0000 mg | ORAL_TABLET | Freq: Every evening | ORAL | 3 refills | Status: DC | PRN

## 2018-03-03 MED ORDER — ALBUTEROL SULFATE (2.5 MG/3ML) 0.083% IN NEBU
2.5000 mg | INHALATION_SOLUTION | Freq: Once | RESPIRATORY_TRACT | Status: AC
  Administered 2018-03-03: 2.5 mg via RESPIRATORY_TRACT

## 2018-03-03 NOTE — Patient Instructions (Addendum)
For therapy -- 1. [facility] ([place], [place], [place]) [phone]  2. [facility] [place]) [phone] 3. [facility] - [phone] 4. [facility] - [phone] (do not file insurance) 5. [facility] accepts most major insurances. [phone] or email [email or web address] 6. [facility], [phone] 7. [facility], [phone]    IF you received an x-ray today, you will receive an invoice from [facility]. Please contact [facility] at [phone] with questions or concerns regarding your invoice.   IF you received labwork today, you will receive an invoice from [facility]. Please contact [facility] at [phone] with questions or concerns regarding your invoice.   Our billing staff will not be able to assist you with questions regarding bills from these companies.  You will be contacted with the lab results as soon as they are available. The fastest way to get your results is to activate your My Chart account. Instructions are located on the last page of this paperwork. If you have not heard from [place] regarding the results in 2 weeks, please contact this office.
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
## 2018-03-07 LAB — ANA,IFA RA DIAG PNL W/RFLX TIT/PATN
ANA Titer 1: NEGATIVE
Cyclic Citrullin Peptide Ab: 5 units (ref 0–19)
Rhuematoid fact SerPl-aCnc: 10.8 IU/mL (ref 0.0–13.9)

## 2018-03-07 LAB — COMPREHENSIVE METABOLIC PANEL
ALT: 15 IU/L (ref 0–32)
AST: 22 IU/L (ref 0–40)
Albumin/Globulin Ratio: 1.5 (ref 1.2–2.2)
Albumin: 4.6 g/dL (ref 3.5–5.5)
Alkaline Phosphatase: 46 IU/L (ref 39–117)
BUN/Creatinine Ratio: 16 (ref 9–23)
BUN: 11 mg/dL (ref 6–24)
Bilirubin Total: 0.3 mg/dL (ref 0.0–1.2)
CO2: 20 mmol/L (ref 20–29)
Calcium: 9.9 mg/dL (ref 8.7–10.2)
Chloride: 100 mmol/L (ref 96–106)
Creatinine, Ser: 0.67 mg/dL (ref 0.57–1.00)
GFR calc Af Amer: 121 mL/min/{1.73_m2} (ref >59)
GFR calc non Af Amer: 105 mL/min/{1.73_m2} (ref >59)
Globulin, Total: 3 g/dL (ref 1.5–4.5)
Glucose: 90 mg/dL (ref 65–99)
Potassium: 4.4 mmol/L (ref 3.5–5.2)
Sodium: 139 mmol/L (ref 134–144)
Total Protein: 7.6 g/dL (ref 6.0–8.5)

## 2018-03-07 LAB — CBC WITH DIFFERENTIAL/PLATELET
Basophils Absolute: 0 10*3/uL (ref 0.0–0.2)
Basos: 1 %
EOS (ABSOLUTE): 0.1 10*3/uL (ref 0.0–0.4)
Eos: 2 %
Hematocrit: 44.3 % (ref 34.0–46.6)
Hemoglobin: 15.5 g/dL (ref 11.1–15.9)
Immature Grans (Abs): 0 10*3/uL (ref 0.0–0.1)
Immature Granulocytes: 0 %
Lymphocytes Absolute: 2.7 10*3/uL (ref 0.7–3.1)
Lymphs: 35 %
MCH: 33.5 pg — ABNORMAL HIGH (ref 26.6–33.0)
MCHC: 35 g/dL (ref 31.5–35.7)
MCV: 96 fL (ref 79–97)
Monocytes Absolute: 0.6 10*3/uL (ref 0.1–0.9)
Monocytes: 8 %
Neutrophils Absolute: 4.1 10*3/uL (ref 1.4–7.0)
Neutrophils: 54 %
Platelets: 212 10*3/uL (ref 150–379)
RBC: 4.62 x10E6/uL (ref 3.77–5.28)
RDW: 12.8 % (ref 12.3–15.4)
WBC: 7.6 10*3/uL (ref 3.4–10.8)

## 2018-03-07 LAB — SEDIMENTATION RATE: Sed Rate: 14 mm/hr (ref 0–32)

## 2018-03-07 LAB — TSH: TSH: 0.896 u[IU]/mL (ref 0.450–4.500)

## 2018-03-07 LAB — C-REACTIVE PROTEIN: CRP: 4.8 mg/L (ref 0.0–4.9)

## 2018-03-13 ENCOUNTER — Encounter: Payer: Self-pay | Admitting: Family Medicine

## 2018-03-23 DIAGNOSIS — Z1231 Encounter for screening mammogram for malignant neoplasm of breast: Secondary | ICD-10-CM | POA: Diagnosis not present

## 2018-03-24 ENCOUNTER — Ambulatory Visit: Payer: BLUE CROSS/BLUE SHIELD | Admitting: Family Medicine

## 2018-03-25 ENCOUNTER — Other Ambulatory Visit: Payer: Self-pay

## 2018-03-25 ENCOUNTER — Ambulatory Visit: Payer: BLUE CROSS/BLUE SHIELD | Admitting: Family Medicine

## 2018-03-25 ENCOUNTER — Encounter: Payer: Self-pay | Admitting: Family Medicine

## 2018-03-25 VITALS — BP 128/80 | HR 98 | Temp 98.0°F | Resp 16 | Ht 66.0 in | Wt 186.0 lb

## 2018-03-25 DIAGNOSIS — R82998 Other abnormal findings in urine: Secondary | ICD-10-CM

## 2018-03-25 DIAGNOSIS — F4310 Post-traumatic stress disorder, unspecified: Secondary | ICD-10-CM | POA: Diagnosis not present

## 2018-03-25 DIAGNOSIS — R140 Abdominal distension (gaseous): Secondary | ICD-10-CM

## 2018-03-25 DIAGNOSIS — M7062 Trochanteric bursitis, left hip: Secondary | ICD-10-CM

## 2018-03-25 LAB — POCT URINALYSIS DIP (MANUAL ENTRY)
Blood, UA: NEGATIVE
Glucose, UA: NEGATIVE mg/dL
Leukocytes, UA: NEGATIVE
Nitrite, UA: NEGATIVE
Protein Ur, POC: NEGATIVE mg/dL
Spec Grav, UA: >=1.03 — AB (ref 1.010–1.025)
Urobilinogen, UA: 1 E.U./dL
pH, UA: 5.5 (ref 5.0–8.0)

## 2018-03-25 LAB — POC MICROSCOPIC URINALYSIS (UMFC): Mucus: ABSENT

## 2018-03-25 MED ORDER — FLUOXETINE HCL 20 MG PO TABS: 10.0000 mg | ORAL_TABLET | Freq: Every day | ORAL | 3 refills | Status: DC
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
## 2018-03-25 NOTE — Patient Instructions (Addendum)
Please decrease fluoxetine to 1/2 tablet (10mg ) once a day. See if side effects (sedation, upset stomach and itchiness) resolve. If they do after a trial of about 2 weeks and there has been an increase in anxiety then try to increase back to 20mg  once a day. If this is not working please call me and I will send a prescription for sertraline 25mg  once a day (you will start taking 1/2 tablet daily). Otherwise I will see you again in 4 weeks.     IF you received an x-ray today, you will receive an invoice from [facility]. Please contact [facility] at [phone] with questions or concerns regarding your invoice.   IF you received labwork today, you will receive an invoice from [facility] [facility]. Please contact [facility] at [phone] with questions or concerns regarding your invoice.   Our billing staff will not be able to assist you with questions regarding bills from these companies.  You will be contacted with the lab results as soon as they are available. The fastest way to get your results is to activate your My Chart account. Instructions are located on the last page of this paperwork. If you have not heard from [place] regarding the results in 2 weeks, please contact this office.

## 2018-04-13 ENCOUNTER — Encounter: Payer: Self-pay | Admitting: Family Medicine

## 2018-04-18 ENCOUNTER — Encounter: Payer: Self-pay | Admitting: Obstetrics and Gynecology

## 2018-04-22 ENCOUNTER — Ambulatory Visit: Payer: BLUE CROSS/BLUE SHIELD | Admitting: Family Medicine

## 2018-04-24 ENCOUNTER — Ambulatory Visit (INDEPENDENT_AMBULATORY_CARE_PROVIDER_SITE_OTHER): Payer: BLUE CROSS/BLUE SHIELD | Admitting: Sports Medicine

## 2018-04-24 ENCOUNTER — Encounter: Payer: Self-pay | Admitting: Sports Medicine

## 2018-04-24 VITALS — BP 118/70 | Ht 67.75 in | Wt 180.0 lb

## 2018-04-24 DIAGNOSIS — M25552 Pain in left hip: Secondary | ICD-10-CM | POA: Diagnosis not present

## 2018-04-24 MED ORDER — MELOXICAM 15 MG PO TABS: ORAL_TABLET | ORAL | 0 refills | Status: DC

## 2018-04-24 NOTE — Progress Notes (Signed)
   Subjective:    Patient ID: Julie Atkinson, female    DOB: Sep 06, 1971, 47 y.o.   MRN: [id number]  HPI chief complaint: Left hip pain  Very pleasant 47 year old female comes in today complaining of 2 years of left hip pain. Pain began after doing a body pump class. Her pain is intermittent but occurs weekly. She localizes it to the posterior lateral hip. She has great difficulty sleeping on her left hip at night. X-rays done in 2017 were unremarkable. She's not had any specific treatment for this. She has tried over-the-counter ibuprofen as needed. She denies groin pain. She does get some numbness around the area but no numbness or tingling down the leg.  Past medical history reviewed Medications reviewed Allergies reviewed    Review of Systems    as above Objective:   Physical Exam  Well-developed, well-nourished. No acute distress. Awake alert and oriented 3. Vital signs reviewed  Left hip: Smooth painless hip range of motion with a negative logroll. She has some tenderness to palpation over the greater trochanter but most of her tenderness is along the insertion of the gluteus medius tendon. Pain and weakness with resisted hip abduction. Positive Trendelenburg when standing on her left leg. Neurovascular intact distally. Walking with a slight limp.      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 We will try meloxicam 15 mg daily for 7 days then as needed. She'll start formal physical therapy and follow-up with me in 6 weeks for reevaluation. Call with questions or concerns in the interim.

## 2018-04-26 ENCOUNTER — Ambulatory Visit: Payer: Self-pay | Admitting: Physical Therapy

## 2018-05-03 ENCOUNTER — Telehealth: Payer: Self-pay | Admitting: *Deleted

## 2018-05-03 MED ORDER — MELOXICAM 15 MG PO TABS: ORAL_TABLET | ORAL | 0 refills | Status: DC

## 2018-05-03 NOTE — Telephone Encounter (Signed)
Refilled

## 2018-05-09 ENCOUNTER — Other Ambulatory Visit: Payer: Self-pay | Admitting: *Deleted

## 2018-05-09 MED ORDER — MELOXICAM 15 MG PO TABS: ORAL_TABLET | ORAL | 0 refills | Status: DC

## 2018-05-16 ENCOUNTER — Ambulatory Visit: Payer: BLUE CROSS/BLUE SHIELD | Admitting: Physical Therapy

## 2018-05-17 ENCOUNTER — Ambulatory Visit: Payer: BLUE CROSS/BLUE SHIELD | Admitting: Physical Therapy

## 2018-05-18 ENCOUNTER — Ambulatory Visit: Payer: BLUE CROSS/BLUE SHIELD | Admitting: Sports Medicine

## 2018-05-19 ENCOUNTER — Encounter: Payer: BLUE CROSS/BLUE SHIELD | Admitting: Sports Medicine

## 2018-05-23 ENCOUNTER — Ambulatory Visit (INDEPENDENT_AMBULATORY_CARE_PROVIDER_SITE_OTHER): Payer: BLUE CROSS/BLUE SHIELD | Admitting: Physical Therapy

## 2018-05-23 DIAGNOSIS — M25552 Pain in left hip: Secondary | ICD-10-CM

## 2018-05-23 DIAGNOSIS — R2689 Other abnormalities of gait and mobility: Secondary | ICD-10-CM | POA: Diagnosis not present

## 2018-05-23 NOTE — Patient Instructions (Addendum)
Access Code: [email or web address]  URL: [email or web address]  Date: 05/23/2018  Prepared by: Lyndee Hensen   Exercises  Supine Single Knee to Chest - 3 reps - 30 hold - 2x daily  Supine Figure 4 Piriformis Stretch - 3 reps - 30 hold - 2x daily  Supine Piriformis Stretch with Leg Straight - 3 reps - 30 hold - 2x daily  Seated Piriformis Stretch with Trunk Bend - 3 reps - 30 hold - 2x daily  TL Sidebending Stretch - Single Arm Overhead - 3 reps - 30 hold - 2x daily

## 2018-05-24 ENCOUNTER — Encounter: Payer: Self-pay | Admitting: Physical Therapy
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
## 2018-05-30 ENCOUNTER — Encounter: Payer: Self-pay | Admitting: Physical Therapy

## 2018-05-31 ENCOUNTER — Encounter: Payer: Self-pay | Admitting: Physical Therapy

## 2018-05-31 ENCOUNTER — Ambulatory Visit: Payer: BLUE CROSS/BLUE SHIELD | Admitting: Physical Therapy

## 2018-05-31 DIAGNOSIS — R2689 Other abnormalities of gait and mobility: Secondary | ICD-10-CM | POA: Diagnosis not present

## 2018-05-31 DIAGNOSIS — M25552 Pain in left hip: Secondary | ICD-10-CM
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
## 2018-05-31 NOTE — Patient Instructions (Signed)
Access Code: [email or web address]  URL: [email or web address]  Date: 05/31/2018  Prepared by: Lyndee Hensen   Exercises  Hooklying Isometric Clamshell - 10 reps - 2 sets - 2x daily  Supine Hip Adduction Isometric with Ball - 10 reps - 2 sets - 2x daily  Supine March - 10 reps - 2 sets - 2x daily  Supine Bridge - 10 reps - 2 sets - 2x daily

## 2018-06-05 ENCOUNTER — Encounter: Payer: BLUE CROSS/BLUE SHIELD | Admitting: Physical Therapy

## 2018-06-08 ENCOUNTER — Encounter: Payer: Self-pay | Admitting: Physical Therapy

## 2018-06-12 ENCOUNTER — Encounter: Payer: BLUE CROSS/BLUE SHIELD | Admitting: Physical Therapy

## 2018-06-15 ENCOUNTER — Encounter: Payer: BLUE CROSS/BLUE SHIELD | Admitting: Physical Therapy

## 2018-06-20 ENCOUNTER — Encounter: Payer: Self-pay | Admitting: Physical Therapy

## 2018-06-20 ENCOUNTER — Ambulatory Visit: Payer: BLUE CROSS/BLUE SHIELD | Admitting: Physical Therapy

## 2018-06-20 DIAGNOSIS — M25552 Pain in left hip: Secondary | ICD-10-CM

## 2018-06-20 DIAGNOSIS — R2689 Other abnormalities of gait and mobility: Secondary | ICD-10-CM
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
## 2018-06-28 ENCOUNTER — Encounter: Payer: Self-pay | Admitting: Physical Therapy

## 2018-06-28 ENCOUNTER — Ambulatory Visit (INDEPENDENT_AMBULATORY_CARE_PROVIDER_SITE_OTHER): Payer: BLUE CROSS/BLUE SHIELD | Admitting: Physical Therapy

## 2018-06-28 DIAGNOSIS — M25552 Pain in left hip: Secondary | ICD-10-CM | POA: Diagnosis not present

## 2018-06-28 DIAGNOSIS — R2689 Other abnormalities of gait and mobility: Secondary | ICD-10-CM | POA: Diagnosis not present
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
## 2018-07-13 ENCOUNTER — Encounter: Payer: BLUE CROSS/BLUE SHIELD | Admitting: Physical Therapy

## 2018-07-24 NOTE — Progress Notes (Signed)
This encounter was created in error - please disregard.

## 2019-01-27 ENCOUNTER — Other Ambulatory Visit: Payer: Self-pay | Admitting: *Deleted

## 2019-01-27 DIAGNOSIS — Z0000 Encounter for general adult medical examination without abnormal findings: Secondary | ICD-10-CM

## 2019-02-01 ENCOUNTER — Encounter: Payer: BLUE CROSS/BLUE SHIELD | Admitting: Family Medicine

## 2019-04-19 ENCOUNTER — Other Ambulatory Visit (HOSPITAL_COMMUNITY)
Admission: RE | Admit: 2019-04-19 | Discharge: 2019-04-19 | Disposition: A | Discharge status: Home / Self Care | Payer: BC Managed Care – PPO | Source: Ambulatory Visit | Attending: Family Medicine | Admitting: Family Medicine

## 2019-04-19 ENCOUNTER — Other Ambulatory Visit: Payer: Self-pay

## 2019-04-19 ENCOUNTER — Ambulatory Visit (INDEPENDENT_AMBULATORY_CARE_PROVIDER_SITE_OTHER): Payer: BC Managed Care – PPO | Admitting: Family Medicine

## 2019-04-19 ENCOUNTER — Telehealth: Payer: Self-pay

## 2019-04-19 ENCOUNTER — Encounter: Payer: Self-pay | Admitting: Family Medicine

## 2019-04-19 VITALS — BP 128/84 | HR 86 | Temp 99.0°F | Ht 67.75 in | Wt 203.0 lb

## 2019-04-19 DIAGNOSIS — Z01411 Encounter for gynecological examination (general) (routine) with abnormal findings: Secondary | ICD-10-CM | POA: Diagnosis not present

## 2019-04-19 DIAGNOSIS — Z113 Encounter for screening for infections with a predominantly sexual mode of transmission: Secondary | ICD-10-CM

## 2019-04-19 DIAGNOSIS — M5442 Lumbago with sciatica, left side: Secondary | ICD-10-CM

## 2019-04-19 DIAGNOSIS — Z13220 Encounter for screening for lipoid disorders: Secondary | ICD-10-CM

## 2019-04-19 DIAGNOSIS — F39 Unspecified mood [affective] disorder: Secondary | ICD-10-CM | POA: Diagnosis not present

## 2019-04-19 DIAGNOSIS — Z01419 Encounter for gynecological examination (general) (routine) without abnormal findings: Secondary | ICD-10-CM | POA: Diagnosis not present

## 2019-04-19 DIAGNOSIS — Z0000 Encounter for general adult medical examination without abnormal findings: Secondary | ICD-10-CM | POA: Diagnosis not present

## 2019-04-19 DIAGNOSIS — Z13228 Encounter for screening for other metabolic disorders: Secondary | ICD-10-CM

## 2019-04-19 DIAGNOSIS — M549 Dorsalgia, unspecified: Secondary | ICD-10-CM

## 2019-04-19 DIAGNOSIS — Z130 Encounter for screening for diseases of the blood and blood-forming organs and certain disorders involving the immune mechanism: Secondary | ICD-10-CM | POA: Diagnosis not present

## 2019-04-19 DIAGNOSIS — J342 Deviated nasal septum: Secondary | ICD-10-CM | POA: Diagnosis not present

## 2019-04-19 DIAGNOSIS — Z1329 Encounter for screening for other suspected endocrine disorder: Secondary | ICD-10-CM

## 2019-04-19 DIAGNOSIS — G8929 Other chronic pain: Secondary | ICD-10-CM

## 2019-04-19 LAB — LIPID PANEL

## 2019-04-19 MED ORDER — TRAZODONE HCL 50 MG PO TABS: 25.0000 mg | ORAL_TABLET | Freq: Every evening | ORAL | 3 refills | Status: DC | PRN

## 2019-04-19 MED ORDER — MELOXICAM 15 MG PO TABS: ORAL_TABLET | ORAL | 0 refills | Status: DC

## 2019-04-19 NOTE — Patient Instructions (Signed)
° ° ° °  If you have lab work done today you will be contacted with your lab results within the next 2 weeks.  If you have not heard from us then please contact us. The fastest way to get your results is to register for My Chart. ° ° °IF you received an x-ray today, you will receive an invoice from [facility]. Please contact [facility] at [phone] with questions or concerns regarding your invoice.  ° °IF you received labwork today, you will receive an invoice from [facility]. Please contact [facility] at [phone] with questions or concerns regarding your invoice.  ° °Our billing staff will not be able to assist you with questions regarding bills from these companies. ° °You will be contacted with the lab results as soon as they are available. The fastest way to get your results is to activate your My Chart account. Instructions are located on the last page of this paperwork. If you have not heard from us regarding the results in 2 weeks, please contact this office. °  ° ° ° °
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
## 2019-04-19 NOTE — Telephone Encounter (Signed)
Copy of insurance form scanned for pt chart

## 2019-04-20 LAB — CBC WITH DIFFERENTIAL/PLATELET
Basophils Absolute: 0.1 10*3/uL (ref 0.0–0.2)
Basos: 1 %
EOS (ABSOLUTE): 0.2 10*3/uL (ref 0.0–0.4)
Eos: 2 %
Hematocrit: 39.5 % (ref 34.0–46.6)
Hemoglobin: 13.4 g/dL (ref 11.1–15.9)
Immature Grans (Abs): 0 10*3/uL (ref 0.0–0.1)
Immature Granulocytes: 0 %
Lymphocytes Absolute: 2.6 10*3/uL (ref 0.7–3.1)
Lymphs: 28 %
MCH: 32.3 pg (ref 26.6–33.0)
MCHC: 33.9 g/dL (ref 31.5–35.7)
MCV: 95 fL (ref 79–97)
Monocytes Absolute: 0.6 10*3/uL (ref 0.1–0.9)
Monocytes: 7 %
Neutrophils Absolute: 5.7 10*3/uL (ref 1.4–7.0)
Neutrophils: 62 %
Platelets: 195 10*3/uL (ref 150–450)
RBC: 4.15 x10E6/uL (ref 3.77–5.28)
RDW: 11.7 % (ref 11.7–15.4)
WBC: 9.1 10*3/uL (ref 3.4–10.8)

## 2019-04-20 LAB — COMPREHENSIVE METABOLIC PANEL
ALT: 9 IU/L (ref 0–32)
AST: 15 IU/L (ref 0–40)
Albumin/Globulin Ratio: 1.7 (ref 1.2–2.2)
Albumin: 4.2 g/dL (ref 3.8–4.8)
Alkaline Phosphatase: 42 IU/L (ref 39–117)
BUN/Creatinine Ratio: 14 (ref 9–23)
BUN: 11 mg/dL (ref 6–24)
Bilirubin Total: 0.3 mg/dL (ref 0.0–1.2)
CO2: 18 mmol/L — ABNORMAL LOW (ref 20–29)
Calcium: 8.5 mg/dL — ABNORMAL LOW (ref 8.7–10.2)
Chloride: 105 mmol/L (ref 96–106)
Creatinine, Ser: 0.76 mg/dL (ref 0.57–1.00)
GFR calc Af Amer: 107 mL/min/{1.73_m2} (ref >59)
GFR calc non Af Amer: 92 mL/min/{1.73_m2} (ref >59)
Globulin, Total: 2.5 g/dL (ref 1.5–4.5)
Glucose: 77 mg/dL (ref 65–99)
Potassium: 4.3 mmol/L (ref 3.5–5.2)
Sodium: 136 mmol/L (ref 134–144)
Total Protein: 6.7 g/dL (ref 6.0–8.5)

## 2019-04-20 LAB — LIPID PANEL
Chol/HDL Ratio: 2 ratio (ref 0.0–4.4)
Cholesterol, Total: 154 mg/dL (ref 100–199)
HDL: 77 mg/dL (ref >39)
LDL Calculated: 57 mg/dL (ref 0–99)
Triglycerides: 99 mg/dL (ref 0–149)
VLDL Cholesterol Cal: 20 mg/dL (ref 5–40)

## 2019-04-20 LAB — WET PREP FOR TRICH, YEAST, CLUE
Clue Cell Exam: NEGATIVE
Trichomonas Exam: NEGATIVE
Yeast Exam: NEGATIVE

## 2019-04-20 LAB — HIV ANTIBODY (ROUTINE TESTING W REFLEX): HIV Screen 4th Generation wRfx: NONREACTIVE

## 2019-04-20 LAB — RPR: RPR Ser Ql: NONREACTIVE

## 2019-04-20 LAB — URINE CYTOLOGY ANCILLARY ONLY
Chlamydia: NEGATIVE
Neisseria Gonorrhea: NEGATIVE

## 2019-04-20 LAB — TSH: TSH: 3.57 u[IU]/mL (ref 0.450–4.500)

## 2019-04-20 LAB — CYTOLOGY - PAP
Adequacy: ABSENT
Diagnosis: NEGATIVE

## 2019-04-23 LAB — URINE CYTOLOGY ANCILLARY ONLY: Candida vaginitis: NEGATIVE

## 2019-04-27 ENCOUNTER — Encounter: Payer: Self-pay | Admitting: Registered Nurse

## 2019-04-27 ENCOUNTER — Ambulatory Visit (INDEPENDENT_AMBULATORY_CARE_PROVIDER_SITE_OTHER): Payer: BC Managed Care – PPO | Admitting: Registered Nurse

## 2019-04-27 ENCOUNTER — Other Ambulatory Visit: Payer: Self-pay | Admitting: Registered Nurse

## 2019-04-27 ENCOUNTER — Other Ambulatory Visit: Payer: Self-pay

## 2019-04-27 VITALS — BP 150/93 | HR 83 | Temp 99.0°F | Resp 16 | Ht 67.0 in | Wt 202.0 lb

## 2019-04-27 DIAGNOSIS — M549 Dorsalgia, unspecified: Secondary | ICD-10-CM

## 2019-04-27 DIAGNOSIS — S61203A Unspecified open wound of left middle finger without damage to nail, initial encounter: Secondary | ICD-10-CM

## 2019-04-27 DIAGNOSIS — G8929 Other chronic pain: Secondary | ICD-10-CM

## 2019-04-27 NOTE — Progress Notes (Signed)
Referral re-sent to different location. Pt having pain in upper, lower back and L hip.  Kathrin Ruddy, NP

## 2019-04-27 NOTE — Patient Instructions (Signed)
° ° ° °  If you have lab work done today you will be contacted with your lab results within the next 2 weeks.  If you have not heard from us then please contact us. The fastest way to get your results is to register for My Chart. ° ° °IF you received an x-ray today, you will receive an invoice from [facility]. Please contact [facility] at [phone] with questions or concerns regarding your invoice.  ° °IF you received labwork today, you will receive an invoice from [facility]. Please contact [facility] at [phone] with questions or concerns regarding your invoice.  ° °Our billing staff will not be able to assist you with questions regarding bills from these companies. ° °You will be contacted with the lab results as soon as they are available. The fastest way to get your results is to activate your My Chart account. Instructions are located on the last page of this paperwork. If you have not heard from us regarding the results in 2 weeks, please contact this office. °  ° ° ° °
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
## 2019-05-01 DIAGNOSIS — Z1231 Encounter for screening mammogram for malignant neoplasm of breast: Secondary | ICD-10-CM | POA: Diagnosis not present

## 2019-05-01 LAB — HM MAMMOGRAPHY

## 2019-05-09 DIAGNOSIS — Z03818 Encounter for observation for suspected exposure to other biological agents ruled out: Secondary | ICD-10-CM | POA: Diagnosis not present

## 2019-05-15 NOTE — Progress Notes (Signed)
1:neg

## 2019-05-16 DIAGNOSIS — J342 Deviated nasal septum: Secondary | ICD-10-CM | POA: Diagnosis not present

## 2019-05-16 DIAGNOSIS — J3489 Other specified disorders of nose and nasal sinuses: Secondary | ICD-10-CM | POA: Diagnosis not present

## 2019-05-16 DIAGNOSIS — J310 Chronic rhinitis: Secondary | ICD-10-CM | POA: Diagnosis not present

## 2019-05-21 ENCOUNTER — Ambulatory Visit: Payer: BC Managed Care – PPO | Admitting: Family Medicine

## 2019-05-22 ENCOUNTER — Encounter: Payer: Self-pay | Admitting: Family Medicine

## 2019-05-30 ENCOUNTER — Ambulatory Visit: Payer: BC Managed Care – PPO | Admitting: Physical Therapy

## 2019-06-11 ENCOUNTER — Other Ambulatory Visit: Payer: Self-pay

## 2019-06-11 ENCOUNTER — Ambulatory Visit (INDEPENDENT_AMBULATORY_CARE_PROVIDER_SITE_OTHER): Payer: BC Managed Care – PPO | Admitting: Physical Therapy

## 2019-06-11 ENCOUNTER — Encounter: Payer: Self-pay | Admitting: Physical Therapy

## 2019-06-11 DIAGNOSIS — M545 Low back pain, unspecified: Secondary | ICD-10-CM

## 2019-06-11 DIAGNOSIS — G8929 Other chronic pain: Secondary | ICD-10-CM

## 2019-06-11 NOTE — Patient Instructions (Signed)
Access Code: [email or web address]  URL: [email or web address]  Date: 06/11/2019  Prepared by: Lyndee Hensen   Exercises Cat Cow - 10 reps - 2 sets - 5 hold - 2x daily Single Knee to Chest Stretch - 3 reps - 30 hold - 2x daily Supine Posterior Pelvic Tilt - 10 reps - 2 sets - 2x daily Supine Transversus Abdominis Bracing - Hands on Ground - 10 reps - 1 sets - 5 hold - 2x daily Child's Pose Stretch - 3 reps - 30 hold - 2x daily
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
## 2019-06-14 ENCOUNTER — Encounter: Payer: BC Managed Care – PPO | Admitting: Physical Therapy

## 2019-06-19 ENCOUNTER — Ambulatory Visit (INDEPENDENT_AMBULATORY_CARE_PROVIDER_SITE_OTHER): Payer: BC Managed Care – PPO | Admitting: Physical Therapy

## 2019-06-19 ENCOUNTER — Other Ambulatory Visit: Payer: Self-pay

## 2019-06-19 ENCOUNTER — Encounter: Payer: Self-pay | Admitting: Physical Therapy

## 2019-06-19 DIAGNOSIS — M545 Low back pain, unspecified: Secondary | ICD-10-CM

## 2019-06-19 DIAGNOSIS — G8929 Other chronic pain: Secondary | ICD-10-CM
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
## 2019-06-21 ENCOUNTER — Encounter: Payer: BC Managed Care – PPO | Admitting: Physical Therapy

## 2019-06-21 DIAGNOSIS — Z0289 Encounter for other administrative examinations: Secondary | ICD-10-CM

## 2019-06-26 ENCOUNTER — Encounter: Payer: BC Managed Care – PPO | Admitting: Physical Therapy

## 2019-06-28 ENCOUNTER — Encounter: Payer: BC Managed Care – PPO | Admitting: Physical Therapy

## 2019-08-02 DIAGNOSIS — Z20828 Contact with and (suspected) exposure to other viral communicable diseases: Secondary | ICD-10-CM | POA: Diagnosis not present

## 2019-11-13 DIAGNOSIS — Z7289 Other problems related to lifestyle: Secondary | ICD-10-CM | POA: Diagnosis not present

## 2019-11-13 DIAGNOSIS — T161XXA Foreign body in right ear, initial encounter: Secondary | ICD-10-CM | POA: Diagnosis not present

## 2019-11-13 DIAGNOSIS — Z87891 Personal history of nicotine dependence: Secondary | ICD-10-CM | POA: Diagnosis not present

## 2020-01-11 DIAGNOSIS — M25511 Pain in right shoulder: Secondary | ICD-10-CM | POA: Diagnosis not present

## 2020-01-14 ENCOUNTER — Other Ambulatory Visit: Payer: BC Managed Care – PPO

## 2020-01-14 DIAGNOSIS — Z23 Encounter for immunization: Secondary | ICD-10-CM | POA: Diagnosis not present

## 2020-01-15 ENCOUNTER — Other Ambulatory Visit: Payer: Self-pay | Admitting: Specialist

## 2020-01-15 DIAGNOSIS — M25511 Pain in right shoulder: Secondary | ICD-10-CM

## 2020-01-23 DIAGNOSIS — M25511 Pain in right shoulder: Secondary | ICD-10-CM | POA: Diagnosis not present

## 2020-01-30 DIAGNOSIS — M75101 Unspecified rotator cuff tear or rupture of right shoulder, not specified as traumatic: Secondary | ICD-10-CM | POA: Diagnosis not present

## 2020-01-30 DIAGNOSIS — M19011 Primary osteoarthritis, right shoulder: Secondary | ICD-10-CM | POA: Diagnosis not present

## 2020-02-12 DIAGNOSIS — Z23 Encounter for immunization: Secondary | ICD-10-CM | POA: Diagnosis not present

## 2020-02-19 DIAGNOSIS — M25511 Pain in right shoulder: Secondary | ICD-10-CM | POA: Diagnosis not present

## 2020-03-07 DIAGNOSIS — Z20822 Contact with and (suspected) exposure to covid-19: Secondary | ICD-10-CM | POA: Diagnosis not present

## 2020-03-07 DIAGNOSIS — Z03818 Encounter for observation for suspected exposure to other biological agents ruled out: Secondary | ICD-10-CM | POA: Diagnosis not present

## 2020-03-07 DIAGNOSIS — Z20828 Contact with and (suspected) exposure to other viral communicable diseases: Secondary | ICD-10-CM | POA: Diagnosis not present

## 2020-05-24 ENCOUNTER — Encounter: Payer: Self-pay | Admitting: Family Medicine

## 2020-05-24 DIAGNOSIS — Z1231 Encounter for screening mammogram for malignant neoplasm of breast: Secondary | ICD-10-CM | POA: Diagnosis not present

## 2020-06-02 ENCOUNTER — Telehealth: Payer: Self-pay | Admitting: Family Medicine

## 2020-06-02 ENCOUNTER — Encounter: Payer: Self-pay | Admitting: Family Medicine

## 2020-06-02 NOTE — Telephone Encounter (Signed)
Pt called states she need a prescription for Julie Atkinson and she don,t want to made  app  Please Advice

## 2020-06-12 DIAGNOSIS — Z20822 Contact with and (suspected) exposure to covid-19: Secondary | ICD-10-CM | POA: Diagnosis not present

## 2020-06-12 DIAGNOSIS — Z03818 Encounter for observation for suspected exposure to other biological agents ruled out: Secondary | ICD-10-CM | POA: Diagnosis not present

## 2021-05-07 DIAGNOSIS — L03311 Cellulitis of abdominal wall: Secondary | ICD-10-CM | POA: Diagnosis not present

## 2021-07-28 DIAGNOSIS — M79671 Pain in right foot: Secondary | ICD-10-CM | POA: Diagnosis not present

## 2021-09-21 ENCOUNTER — Encounter: Payer: Self-pay | Admitting: Family Medicine

## 2021-10-08 ENCOUNTER — Ambulatory Visit (INDEPENDENT_AMBULATORY_CARE_PROVIDER_SITE_OTHER): Payer: BC Managed Care – PPO | Admitting: Registered Nurse

## 2021-10-08 ENCOUNTER — Other Ambulatory Visit: Payer: Self-pay

## 2021-10-08 ENCOUNTER — Encounter: Payer: Self-pay | Admitting: Registered Nurse

## 2021-10-08 VITALS — BP 147/83 | HR 79 | Temp 98.2°F | Resp 18 | Ht 67.0 in | Wt 196.2 lb

## 2021-10-08 DIAGNOSIS — Z13220 Encounter for screening for lipoid disorders: Secondary | ICD-10-CM | POA: Diagnosis not present

## 2021-10-08 DIAGNOSIS — Z1329 Encounter for screening for other suspected endocrine disorder: Secondary | ICD-10-CM

## 2021-10-08 DIAGNOSIS — Z130 Encounter for screening for diseases of the blood and blood-forming organs and certain disorders involving the immune mechanism: Secondary | ICD-10-CM

## 2021-10-08 DIAGNOSIS — Z0000 Encounter for general adult medical examination without abnormal findings: Secondary | ICD-10-CM | POA: Diagnosis not present

## 2021-10-08 DIAGNOSIS — Z1211 Encounter for screening for malignant neoplasm of colon: Secondary | ICD-10-CM

## 2021-10-08 DIAGNOSIS — N926 Irregular menstruation, unspecified: Secondary | ICD-10-CM

## 2021-10-08 DIAGNOSIS — Z13228 Encounter for screening for other metabolic disorders: Secondary | ICD-10-CM | POA: Diagnosis not present

## 2021-10-08 DIAGNOSIS — R4586 Emotional lability: Secondary | ICD-10-CM | POA: Diagnosis not present

## 2021-10-08 MED ORDER — FLUOXETINE HCL 20 MG PO CAPS
20.0000 mg | ORAL_CAPSULE | Freq: Every day | ORAL | 0 refills | Status: DC
Start: 2021-10-08 — End: 2021-11-23

## 2021-10-08 NOTE — Patient Instructions (Addendum)
Ms. Julie Atkinson to see you!  Exam reassuring today. We'll see how labs look. I'll call with any urgent concerns.  Let's touch base in 6 weeks to see how the fluoxetine is working out.  Call sooner if you need anything!  Thanks,  Rich     If you have lab work done today you will be contacted with your lab results within the next 2 weeks.  If you have not heard from [place] then please contact us. The fastest way to get your results is to register for My Chart.   IF you received an x-ray today, you will receive an invoice from [facility]. Please contact [facility] at [phone] with questions or concerns regarding your invoice.   IF you received labwork today, you will receive an invoice from [facility]. Please contact [facility] at [phone] with questions or concerns regarding your invoice.   Our billing staff will not be able to assist you with questions regarding bills from these companies.  You will be contacted with the lab results as soon as they are available. The fastest way to get your results is to activate your My Chart account. Instructions are located on the last page of this paperwork. If you have not heard from [place] regarding the results in 2 weeks, please contact this office.

## 2021-10-08 NOTE — Progress Notes (Signed)
Established Patient Office Visit  Subjective:  Patient ID: Julie Atkinson, female    DOB: September 01, 1971  Age: 50 y.o. MRN: [id number]  CC:  Chief Complaint  Patient presents with   Annual Exam    Patient states she is here for an annual physical and medication refill    HPI Julie Atkinson presents for CPE, medication refill  Foot pain Seen by [facility] - dx with achilles tendonitis on R side.  Had to stop course because of flu infection. Interested in restarting. Has been ongoing for 1x year.  Depression Labile mood Denies hi/si Has had rx for effexor and prozac in past but never took these.  Quick to anger.  Abnormal menses Shorter, heavier Suspects she is approaching menopause Would like to have hormone levels checked Some night sweats, but recently got new mattress which may be contributing. Unsure if emotional lability related.   Colon ca screen Has not had done Would prefer colonoscopy to cologuard.  Past Medical History:  Diagnosis Date   Depression    Fibroid 2014/02/13   8 mm   History of posttraumatic stress disorder (PTSD) 02/14/2008   due to maternal death-MI   PTSD (post-traumatic stress disorder)    mother's death 2008-02-14; seizure and AMI while driving, patient performed CPR x 45 minutes   Seropositive for herpes simplex 2 infection Feb 14, 2016    Past Surgical History:  Procedure Laterality Date   BLADDER SURGERY  1978   nocturnal enuresis treatment   CESAREAN SECTION  02-13-2006   Dr Quincy Simmonds   DILATATION & CURETTAGE/HYSTEROSCOPY WITH MYOSURE N/A 01/06/2016   Procedure: DILATATION & CURETTAGE/HYSTEROSCOPY WITH Jacklynn Barnacle;  Surgeon: Nunzio Cobbs, MD;  Location: [facility];  Service: Gynecology;  Laterality: N/A;   DILATION AND CURETTAGE OF UTERUS  1990   MAB?    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   Lives with her son.   Lost her job suddenly 08/20/2016 with job cuts at work.   Social Determinants of  Health   Financial Resource Strain: Not on file  Food Insecurity: Not on file  Transportation Needs: Not on file  Physical Activity: Not on file  Stress: Not on file  Social Connections: Not on file  Intimate Partner Violence: Not on file    No outpatient medications prior to visit.   No facility-administered medications prior to visit.    Allergies  Allergen Reactions   Albuterol     Too jottery   Codeine Nausea And Vomiting    ROS Review of Systems  Constitutional: Negative.   HENT: Negative.    Eyes: Negative.   Respiratory: Negative.    Cardiovascular: Negative.   Gastrointestinal: Negative.   Genitourinary: Negative.   Musculoskeletal: Negative.   Skin: Negative.   Neurological: Negative.   Psychiatric/Behavioral: Negative.    All other systems reviewed and are negative.    Objective:    Physical Exam Vitals and  nursing note reviewed.  Constitutional:      General: She is not in acute distress.    Appearance: Normal appearance. She is normal weight. She is not ill-appearing, toxic-appearing or diaphoretic.  HENT:     Head: Normocephalic and atraumatic.     Right Ear: Tympanic membrane, ear canal and external ear normal. There is no impacted cerumen.     Left Ear: Tympanic membrane, ear canal and external ear normal. There is no impacted cerumen.     Nose: Nose normal. No congestion or rhinorrhea.     Mouth/Throat:     Mouth: Mucous membranes are moist.     Pharynx: Oropharynx is clear. No oropharyngeal exudate or posterior oropharyngeal erythema.  Eyes:     General: No scleral icterus.       Right eye: No discharge.        Left eye: No discharge.     Extraocular Movements: Extraocular movements intact.     Conjunctiva/sclera: Conjunctivae normal.     Pupils: Pupils are equal, round, and reactive to light.  Cardiovascular:     Rate and Rhythm: Normal rate and regular rhythm.     Pulses: Normal pulses.     Heart sounds: Normal heart sounds. No murmur heard.   No friction rub. No gallop.  Pulmonary:     Effort: Pulmonary effort is normal. No respiratory distress.     Breath sounds: Normal breath sounds. No stridor. No wheezing, rhonchi or rales.  Chest:     Chest wall: No tenderness.  Abdominal:     General: Abdomen is flat. Bowel sounds are normal. There is no distension.     Palpations: Abdomen is soft. There is no mass.     Tenderness: There is no abdominal tenderness. There is no right CVA tenderness, left CVA tenderness, guarding or rebound.     Hernia: No hernia is present.  Musculoskeletal:        General: No swelling, tenderness, deformity or signs of injury. Normal range of motion.     Right lower leg: No edema.     Left lower leg: No edema.  Skin:    General: Skin is warm and dry.     Capillary Refill: Capillary refill takes less than 2 seconds.     Coloration: Skin is not  jaundiced or pale.     Findings: No bruising, erythema, lesion or rash.  Neurological:     General: No focal deficit present.     Mental Status: She is alert and oriented to person, place, and time. Mental status is at baseline.  Cranial Nerves: No cranial nerve deficit.     Sensory: No sensory deficit.     Motor: No weakness.     Coordination: Coordination normal.     Gait: Gait normal.     Deep Tendon Reflexes: Reflexes normal.  Psychiatric:        Mood and Affect: Mood normal.        Behavior: Behavior normal.        Thought Content: Thought content normal.        Judgment: Judgment normal.    BP (!) 147/83   Pulse 79   Temp 98.2 F (36.8 C) (Temporal)   Resp 18   Ht 5\' 7"  (1.702 m)   Wt 196 lb 3.2 oz (89 kg)   SpO2 97%   BMI 30.73 kg/m  Wt Readings from Last 3 Encounters:  10/08/21 196 lb 3.2 oz (89 kg)  04/27/19 202 lb (91.6 kg)  04/19/19 203 lb (92.1 kg)     Health Maintenance Due  Topic Date Due   COVID-19 Vaccine (1) Never done   COLONOSCOPY (Pts 45-23yrs Insurance coverage will need to be confirmed)  Never done   Zoster Vaccines- Shingrix (1 of 2) Never done   INFLUENZA VACCINE  06/08/2021    There are no preventive care reminders to display for this patient.  Lab Results  Component Value Date   TSH 3.570 04/19/2019   Lab Results  Component Value Date   WBC 9.1 04/19/2019   HGB 13.4 04/19/2019   HCT 39.5 04/19/2019   MCV 95 04/19/2019   PLT 195 04/19/2019   Lab Results  Component Value Date   NA 136 04/19/2019   K 4.3 04/19/2019   CO2 18 (L) 04/19/2019   GLUCOSE 77 04/19/2019   BUN 11 04/19/2019   CREATININE 0.76 04/19/2019   BILITOT 0.3 04/19/2019   ALKPHOS 42 04/19/2019   AST 15 04/19/2019   ALT 9 04/19/2019   PROT 6.7 04/19/2019   ALBUMIN 4.2 04/19/2019   CALCIUM 8.5 (L) 04/19/2019   Lab Results  Component Value Date   CHOL 154 04/19/2019   Lab Results  Component Value Date   HDL 77 04/19/2019   Lab Results  Component  Value Date   LDLCALC 57 04/19/2019   Lab Results  Component Value Date   TRIG 99 04/19/2019   Lab Results  Component Value Date   CHOLHDL 2.0 04/19/2019   No results found for: HGBA1C    Assessment & Plan:   Problem List Items Addressed This Visit   None Visit Diagnoses     Annual physical exam    -  Primary   Colon cancer screening       Relevant Orders   Ambulatory referral to Gastroenterology   Labile mood       Relevant Medications   FLUoxetine (PROZAC) 20 MG capsule   Screening for endocrine, metabolic and immunity disorder       Relevant Orders   CBC with Differential/Platelet   Comprehensive metabolic panel   Hemoglobin A1c   TSH   Lipid screening       Relevant Orders   Lipid panel   Abnormal menses       Relevant Orders   Estrogens, Total   FSH/LH       Meds ordered this encounter  Medications   FLUoxetine (PROZAC) 20 MG capsule    Sig: Take 1 capsule (20 mg total) by mouth daily.    Dispense:  90 capsule  Refill:  0    Order Specific Question:   Supervising Provider    Answer:   Carlota Raspberry, JEFFREY R [[id number]]    Follow-up: Return in about 6 weeks (around 11/19/2021) for med check fluoxetine - then in 1 year for CPE and labs.Marland Kitchen   PLAN Exam unremarkable Labs collected. Will follow up with the patient as warranted. Start fluoxetine 20mg  po qd. Med check in 6 weeks. Colonoscopy referral sent.  Patient encouraged to call clinic with any questions, comments, or concerns.   [name], NP

## 2021-10-09 LAB — CBC WITH DIFFERENTIAL/PLATELET
Basophils Absolute: 0.1 10*3/uL (ref 0.0–0.1)
Basophils Relative: 0.8 % (ref 0.0–3.0)
Eosinophils Absolute: 0.1 10*3/uL (ref 0.0–0.7)
Eosinophils Relative: 0.9 % (ref 0.0–5.0)
HCT: 42.1 % (ref 36.0–46.0)
Hemoglobin: 14.2 g/dL (ref 12.0–15.0)
Lymphocytes Relative: 30.8 % (ref 12.0–46.0)
Lymphs Abs: 2.4 10*3/uL (ref 0.7–4.0)
MCHC: 33.6 g/dL (ref 30.0–36.0)
MCV: 97.9 fl (ref 78.0–100.0)
Monocytes Absolute: 0.6 10*3/uL (ref 0.1–1.0)
Monocytes Relative: 7.9 % (ref 3.0–12.0)
Neutro Abs: 4.7 10*3/uL (ref 1.4–7.7)
Neutrophils Relative %: 59.6 % (ref 43.0–77.0)
Platelets: 237 10*3/uL (ref 150.0–400.0)
RBC: 4.3 Mil/uL (ref 3.87–5.11)
RDW: 12.5 % (ref 11.5–15.5)
WBC: 7.9 10*3/uL (ref 4.0–10.5)

## 2021-10-09 LAB — COMPREHENSIVE METABOLIC PANEL
ALT: 9 U/L (ref 0–35)
AST: 11 U/L (ref 0–37)
Albumin: 4.4 g/dL (ref 3.5–5.2)
Alkaline Phosphatase: 39 U/L (ref 39–117)
BUN: 11 mg/dL (ref 6–23)
CO2: 24 mEq/L (ref 19–32)
Calcium: 9.6 mg/dL (ref 8.4–10.5)
Chloride: 106 mEq/L (ref 96–112)
Creatinine, Ser: 0.82 mg/dL (ref 0.40–1.20)
GFR: 82.73 mL/min (ref >60.00)
Glucose, Bld: 80 mg/dL (ref 70–99)
Potassium: 4.3 mEq/L (ref 3.5–5.1)
Sodium: 139 mEq/L (ref 135–145)
Total Bilirubin: 0.4 mg/dL (ref 0.2–1.2)
Total Protein: 7.4 g/dL (ref 6.0–8.3)

## 2021-10-09 LAB — LIPID PANEL
Cholesterol: 197 mg/dL (ref 0–200)
HDL: 88.5 mg/dL (ref >39.00)
LDL Cholesterol: 90 mg/dL (ref 0–99)
NonHDL: 108.58
Total CHOL/HDL Ratio: 2
Triglycerides: 94 mg/dL (ref 0.0–149.0)
VLDL: 18.8 mg/dL (ref 0.0–40.0)

## 2021-10-09 LAB — TSH: TSH: 2.9 u[IU]/mL (ref 0.35–5.50)

## 2021-10-09 LAB — HEMOGLOBIN A1C: Hgb A1c MFr Bld: 5.1 % (ref 4.6–6.5)

## 2021-10-09 NOTE — Telephone Encounter (Signed)
Pt asking about prednisone and trazodone prescriptions   OV 10/08/21  Please advise, no note about these rx being sent

## 2021-10-12 ENCOUNTER — Encounter: Payer: Self-pay | Admitting: Registered Nurse

## 2021-10-12 ENCOUNTER — Other Ambulatory Visit: Payer: Self-pay | Admitting: Registered Nurse

## 2021-10-12 DIAGNOSIS — G479 Sleep disorder, unspecified: Secondary | ICD-10-CM

## 2021-10-12 MED ORDER — PREDNISONE 10 MG (21) PO TBPK: ORAL_TABLET | ORAL | 0 refills | Status: DC

## 2021-10-12 MED ORDER — TRAZODONE HCL 50 MG PO TABS: 25.0000 mg | ORAL_TABLET | Freq: Every evening | ORAL | 3 refills | Status: DC | PRN

## 2021-10-14 LAB — ESTROGENS, TOTAL: Estrogen: 182.7 pg/mL

## 2021-10-14 LAB — FSH/LH
FSH: 13.4 m[IU]/mL
LH: 5.9 m[IU]/mL

## 2021-10-21 ENCOUNTER — Other Ambulatory Visit: Payer: Self-pay

## 2021-10-21 ENCOUNTER — Ambulatory Visit (INDEPENDENT_AMBULATORY_CARE_PROVIDER_SITE_OTHER): Payer: BC Managed Care – PPO | Admitting: Registered Nurse

## 2021-10-21 ENCOUNTER — Encounter: Payer: Self-pay | Admitting: Registered Nurse

## 2021-10-21 VITALS — BP 126/82 | HR 70 | Temp 98.2°F | Resp 18 | Ht 67.0 in | Wt 194.8 lb

## 2021-10-21 DIAGNOSIS — G479 Sleep disorder, unspecified: Secondary | ICD-10-CM | POA: Diagnosis not present

## 2021-10-21 DIAGNOSIS — N6311 Unspecified lump in the right breast, upper outer quadrant: Secondary | ICD-10-CM

## 2021-10-21 MED ORDER — PREDNISONE 10 MG (21) PO TBPK: ORAL_TABLET | ORAL | 0 refills | Status: DC

## 2021-10-21 MED ORDER — MELOXICAM 7.5 MG PO TABS
7.5000 mg | ORAL_TABLET | Freq: Every evening | ORAL | 3 refills | Status: DC | PRN
Start: 2021-10-21 — End: 2021-12-04

## 2021-10-21 NOTE — Progress Notes (Signed)
Established Patient Office Visit  Subjective:  Patient ID: Julie Atkinson, female    DOB: 1971/08/03  Age: 50 y.o. MRN: [id number]  CC:  Chief Complaint  Patient presents with   Mass    Patient states she is here because she notice a lump in her right breast that is between a golf ball and tennis ball size since Sunday morning. Patient states it is tender to touch.    HPI Julie Atkinson presents for breast lump  First noticed on Sunday Between golf ball and tennis ball in size Tender to palpation  Notes remote hx of "false pregnancy" around age 17.   Last mammograph in July of 2021 - BiRADS 1: negative Does have both grandmothers, mat aunt, and pat aunt with "female" cancers.  Past Medical History:  Diagnosis Date   Depression    Fibroid 2014-01-25   8 mm   History of posttraumatic stress disorder (PTSD) 2008/01/26   due to maternal death-MI   PTSD (post-traumatic stress disorder)    mother's death Jan 26, 2008; seizure and AMI while driving, patient performed CPR x 45 minutes   Seropositive for herpes simplex 2 infection Jan 26, 2016    Past Surgical History:  Procedure Laterality Date   BLADDER SURGERY  1978   nocturnal enuresis treatment   CESAREAN SECTION  2006-01-25   Dr Quincy Simmonds   DILATATION & CURETTAGE/HYSTEROSCOPY WITH MYOSURE N/A 01/06/2016   Procedure: DILATATION & CURETTAGE/HYSTEROSCOPY WITH Jacklynn Barnacle;  Surgeon: Nunzio Cobbs, MD;  Location: [facility];  Service: Gynecology;  Laterality: N/A;   DILATION AND CURETTAGE OF UTERUS  1990   MAB?    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   Lives with her son.   Lost her job suddenly 08/20/2016 with job cuts at work.   Social Determinants of Health   Financial Resource Strain: Not on file  Food Insecurity: Not on file  Transportation Needs: Not on file  Physical Activity: Not on file  Stress: Not on file  Social  Connections: Not on file  Intimate Partner Violence: Not on file    Outpatient Medications Prior to Visit  Medication Sig Dispense Refill   FLUoxetine (PROZAC) 20 MG capsule Take 1 capsule (20 mg total) by mouth daily. 90 capsule 0   traZODone (DESYREL) 50 MG tablet Take 0.5-1 tablets (25-50 mg total) by mouth at bedtime as needed for sleep. 30 tablet 3   predniSONE (STERAPRED UNI-PAK 21 TAB) 10 MG (21) TBPK tablet Take per package instructions. Do not skip doses. Finish entire supply. 1 each 0   No facility-administered medications prior to visit.    Allergies  Allergen Reactions   Albuterol     Too jottery   Codeine Nausea And Vomiting    ROS Review of Systems  Constitutional: Negative.   HENT: Negative.    Eyes: Negative.   Respiratory: Negative.    Cardiovascular: Negative.   Gastrointestinal: Negative.   Genitourinary: Negative.   Musculoskeletal: Negative.   Skin: Negative.    Neurological: Negative.   Psychiatric/Behavioral: Negative.    All other systems reviewed and are negative.    Objective:    Physical Exam Vitals and nursing note reviewed.  Constitutional:      General: She is not in acute distress.    Appearance: Normal appearance. She is normal weight. She is not ill-appearing, toxic-appearing or diaphoretic.  Cardiovascular:     Rate and Rhythm: Normal rate and regular rhythm.     Heart sounds: Normal heart sounds. No murmur heard.   No friction rub. No gallop.  Pulmonary:     Effort: Pulmonary effort is normal. No respiratory distress.     Breath sounds: Normal breath sounds. No stridor. No wheezing, rhonchi or rales.  Chest:     Chest wall: No tenderness.    Skin:    General: Skin is warm and dry.  Neurological:     General: No focal deficit present.     Mental Status: She is alert and oriented to person, place, and time. Mental status is at baseline.  Psychiatric:        Mood and Affect: Mood normal.        Behavior: Behavior normal.        Thought Content: Thought content normal.        Judgment: Judgment normal.    BP 126/82    Pulse 70    Temp 98.2 F (36.8 C) (Temporal)    Resp 18    Ht 5\' 7"  (1.702 m)    Wt 194 lb 12.8 oz (88.4 kg)    SpO2 99%    BMI 30.51 kg/m  Wt Readings from Last 3 Encounters:  10/21/21 194 lb 12.8 oz (88.4 kg)  10/08/21 196 lb 3.2 oz (89 kg)  04/27/19 202 lb (91.6 kg)     Health Maintenance Due  Topic Date Due   COVID-19 Vaccine (1) Never done   COLONOSCOPY (Pts 45-34yrs Insurance coverage will need to be confirmed)  Never done   Zoster Vaccines- Shingrix (1 of 2) Never done   INFLUENZA VACCINE  06/08/2021    There are no preventive care reminders to display for this patient.  Lab Results  Component Value Date   TSH 2.90 10/08/2021   Lab Results  Component Value Date   WBC 7.9 10/08/2021   HGB 14.2 10/08/2021   HCT 42.1 10/08/2021   MCV 97.9 10/08/2021   PLT 237.0 10/08/2021   Lab  Results  Component Value Date   NA 139 10/08/2021  K 4.3 10/08/2021   CO2 24 10/08/2021   GLUCOSE 80 10/08/2021   BUN 11 10/08/2021   CREATININE 0.82 10/08/2021   BILITOT 0.4 10/08/2021   ALKPHOS 39 10/08/2021   AST 11 10/08/2021   ALT 9 10/08/2021   PROT 7.4 10/08/2021   ALBUMIN 4.4 10/08/2021   CALCIUM 9.6 10/08/2021   GFR 82.73 10/08/2021   Lab Results  Component Value Date   CHOL 197 10/08/2021   Lab Results  Component Value Date   HDL 88.50 10/08/2021   Lab Results  Component Value Date   LDLCALC 90 10/08/2021   Lab Results  Component Value Date   TRIG 94.0 10/08/2021   Lab Results  Component Value Date   CHOLHDL 2 10/08/2021   Lab Results  Component Value Date   HGBA1C 5.1 10/08/2021      Assessment & Plan:   Problem List Items Addressed This Visit       Other   Breast lump on right side at 10 o'clock position - Primary   Relevant Orders   MM Digital Diagnostic Bilat   Other Visit Diagnoses     Sleep disturbance       Relevant Medications   meloxicam (MOBIC) 7.5 MG tablet   predniSONE (STERAPRED UNI-PAK 21 TAB) 10 MG (21) TBPK tablet       Meds ordered this encounter  Medications   meloxicam (MOBIC) 7.5 MG tablet    Sig: Take 1 tablet (7.5 mg total) by mouth at bedtime as needed for pain.    Dispense:  90 tablet    Refill:  3    Order Specific Question:   Supervising Provider    Answer:   Carlota Raspberry, JEFFREY R [[id number]]   predniSONE (STERAPRED UNI-PAK 21 TAB) 10 MG (21) TBPK tablet    Sig: Take per package instructions. Do not skip doses. Finish entire supply.    Dispense:  1 each    Refill:  0    Order Specific Question:   Supervising Provider    Answer:   Carlota Raspberry, JEFFREY R [[id number]]    Follow-up: Return if symptoms worsen or fail to improve.   PLAN Diagnostic mammography ordered. No red flags on exam but warrants imaging Meloxicam for discomfort and sleep. Refill prednisone Patient encouraged to call clinic with any questions,  comments, or concerns.  [name], NP

## 2021-10-21 NOTE — Patient Instructions (Signed)
Julie Atkinson -   Imaging will be at [facility] - they will call you to set this up, or you can call them at [phone]  [place].  I'll touch base when results are available.  Thank you  Rich

## 2021-11-08 HISTORY — PX: OTHER SURGICAL HISTORY: SHX169

## 2021-11-10 ENCOUNTER — Other Ambulatory Visit: Payer: Self-pay | Admitting: Registered Nurse

## 2021-11-10 DIAGNOSIS — N6311 Unspecified lump in the right breast, upper outer quadrant: Secondary | ICD-10-CM

## 2021-11-17 ENCOUNTER — Telehealth: Payer: Self-pay

## 2021-11-17 DIAGNOSIS — N6311 Unspecified lump in the right breast, upper outer quadrant: Secondary | ICD-10-CM | POA: Diagnosis not present

## 2021-11-17 DIAGNOSIS — R928 Other abnormal and inconclusive findings on diagnostic imaging of breast: Secondary | ICD-10-CM | POA: Diagnosis not present

## 2021-11-17 NOTE — Telephone Encounter (Signed)
Caller name:Julie Atkinson   On DPR? :Yes  Call back number:[phone]  Provider they see: [name]   Reason for call:[facility] sent a Biopsy Order to be signed placed in [place] bin

## 2021-11-18 NOTE — Telephone Encounter (Signed)
Placed in the back bin to be review then will Abstract .

## 2021-11-19 ENCOUNTER — Other Ambulatory Visit: Payer: Self-pay

## 2021-11-19 DIAGNOSIS — C50811 Malignant neoplasm of overlapping sites of right female breast: Secondary | ICD-10-CM | POA: Diagnosis not present

## 2021-11-19 DIAGNOSIS — C50411 Malignant neoplasm of upper-outer quadrant of right female breast: Secondary | ICD-10-CM | POA: Diagnosis not present

## 2021-11-19 DIAGNOSIS — C50111 Malignant neoplasm of central portion of right female breast: Secondary | ICD-10-CM | POA: Diagnosis not present

## 2021-11-19 LAB — HM MAMMOGRAPHY

## 2021-11-20 ENCOUNTER — Ambulatory Visit: Payer: BC Managed Care – PPO | Admitting: Registered Nurse

## 2021-11-20 NOTE — Telephone Encounter (Signed)
Pt asking for results of biopsy? I do not see where we performed a biopsy is there something you can see or reports that will satisfy her request?

## 2021-11-20 NOTE — Telephone Encounter (Signed)
Pt called in asking for the results of her biopsy she wants a call back today

## 2021-11-23 ENCOUNTER — Other Ambulatory Visit: Payer: Self-pay | Admitting: Registered Nurse

## 2021-11-23 DIAGNOSIS — C50919 Malignant neoplasm of unspecified site of unspecified female breast: Secondary | ICD-10-CM

## 2021-11-23 DIAGNOSIS — R4586 Emotional lability: Secondary | ICD-10-CM

## 2021-11-23 DIAGNOSIS — G479 Sleep disorder, unspecified: Secondary | ICD-10-CM

## 2021-11-23 MED ORDER — FLUOXETINE HCL 20 MG PO CAPS: 20.0000 mg | ORAL_CAPSULE | Freq: Every day | ORAL | 0 refills | Status: DC

## 2021-11-23 MED ORDER — TRAZODONE HCL 50 MG PO TABS: 25.0000 mg | ORAL_TABLET | Freq: Every evening | ORAL | 3 refills | Status: DC | PRN

## 2021-11-23 MED ORDER — CLONAZEPAM 0.5 MG PO TBDP: 0.5000 mg | ORAL_TABLET | Freq: Two times a day (BID) | ORAL | 0 refills | Status: DC

## 2021-11-23 NOTE — Progress Notes (Signed)
Called patient to discuss results of biopsy - invasive ductal carcinoma. Grade 2  Seen at [facility] for mammo and biopsy by: Robbi Garter, MD  Has been referred to Dorann Ou at [facility]  Would like to be seen at [place] for opinions as well before she decides on a care team to stick with   Pt notes acute anxiety in wake of dx. Would like something to help treat. Has not used benzos in past pdmp consulted no concerns.   Kathrin Ruddy, NP

## 2021-11-23 NOTE — Telephone Encounter (Signed)
Called and spoke with pt, documented in other note.  Thanks,  Denice Paradise

## 2021-11-24 ENCOUNTER — Telehealth: Payer: Self-pay | Admitting: *Deleted

## 2021-11-24 ENCOUNTER — Other Ambulatory Visit: Payer: Self-pay | Admitting: *Deleted

## 2021-11-24 ENCOUNTER — Encounter: Payer: Self-pay | Admitting: Registered Nurse

## 2021-11-24 ENCOUNTER — Encounter: Payer: Self-pay | Admitting: *Deleted

## 2021-11-24 DIAGNOSIS — G479 Sleep disorder, unspecified: Secondary | ICD-10-CM

## 2021-11-24 DIAGNOSIS — R4586 Emotional lability: Secondary | ICD-10-CM

## 2021-11-24 MED ORDER — FLUOXETINE HCL 20 MG PO CAPS: 20.0000 mg | ORAL_CAPSULE | Freq: Every day | ORAL | 0 refills | Status: DC

## 2021-11-24 MED ORDER — TRAZODONE HCL 50 MG PO TABS: 25.0000 mg | ORAL_TABLET | Freq: Every evening | ORAL | 3 refills | Status: DC | PRN

## 2021-11-24 MED ORDER — CLONAZEPAM 0.5 MG PO TBDP: 0.5000 mg | ORAL_TABLET | Freq: Two times a day (BID) | ORAL | 0 refills | Status: DC

## 2021-11-24 NOTE — Telephone Encounter (Signed)
Caller name: Alexsandria Kivett  Caller callback [phone]   Notes/Comments from patient:  Patient was seen yesterday by [name] and he prescribed clonazepam, trazodone, and fluoxetine. Patient's prescriptions were sent to [facility] where they are out of medications. She would like all three resent to [facility] in [place]. Pharmacy changed in system.

## 2021-11-24 NOTE — Addendum Note (Signed)
Addended by: Patrcia Dolly on: 11/24/2021 04:40 PM   Modules accepted: Orders

## 2021-11-24 NOTE — Telephone Encounter (Signed)
Pt called back to check on this please advise

## 2021-11-24 NOTE — Telephone Encounter (Signed)
Confirmed [facility] for 12/02/21 at 1215 .  Instructions and contact information given.

## 2021-11-25 ENCOUNTER — Other Ambulatory Visit: Payer: Self-pay | Admitting: *Deleted

## 2021-11-26 ENCOUNTER — Encounter: Payer: Self-pay | Admitting: Registered Nurse

## 2021-11-27 DIAGNOSIS — N6311 Unspecified lump in the right breast, upper outer quadrant: Secondary | ICD-10-CM | POA: Diagnosis not present

## 2021-11-30 ENCOUNTER — Encounter: Payer: Self-pay | Admitting: *Deleted

## 2021-11-30 DIAGNOSIS — C50419 Malignant neoplasm of upper-outer quadrant of unspecified female breast: Secondary | ICD-10-CM | POA: Diagnosis not present

## 2021-11-30 DIAGNOSIS — C50911 Malignant neoplasm of unspecified site of right female breast: Secondary | ICD-10-CM | POA: Diagnosis not present

## 2021-11-30 DIAGNOSIS — C50919 Malignant neoplasm of unspecified site of unspecified female breast: Secondary | ICD-10-CM | POA: Diagnosis not present

## 2021-12-01 ENCOUNTER — Other Ambulatory Visit: Payer: Self-pay | Admitting: *Deleted

## 2021-12-01 ENCOUNTER — Encounter: Payer: Self-pay | Admitting: *Deleted

## 2021-12-01 DIAGNOSIS — C50411 Malignant neoplasm of upper-outer quadrant of right female breast: Secondary | ICD-10-CM

## 2021-12-01 NOTE — Progress Notes (Signed)
Radiation Oncology         [phone] ________________________________  Multidisciplinary Breast Oncology Clinic [facility]) Initial Outpatient Consultation  Name: Julie Atkinson MRN: [id number]  Date: 12/02/2021  DOB: 12-06-1970  CC:[name], NP  Coralie Keens, MD   REFERRING PHYSICIAN: Coralie Keens, MD  DIAGNOSIS: The encounter diagnosis was Malignant neoplasm of upper-outer quadrant of right breast in female, estrogen receptor positive ([place]).  Stage IB (cT2, cN0, cM0) Right Breast UOQ, Invasive and in-situ mammary carcinoma, ER+ / PR+ / Her2+, Grade 2    ICD-10-CM   1. Malignant neoplasm of upper-outer quadrant of right breast in female, estrogen receptor positive ([place])  C50.411    Z17.0       HISTORY OF PRESENT ILLNESS::Julie Atkinson is a 51 y.o. female who is presenting to the office today for evaluation of her newly diagnosed breast cancer. She is accompanied by her former mother in-law. She is doing well overall.   The patient initially presented with a palpable lump in the right breast x 3 weeks. She underwent bilateral diagnostic mammography with tomography and right breast ultrasonography at [facility] on 11/17/21 showing: a 2.4 x 3 x 1.5 cm irregular spiculated right breast mass, located at the 10 o'clock position, 6 cmfn, correlating with palpable site of concern, and appearing highly suspicious of malignancy.  Right breast biopsy at the 10 o'clock position, 6 cmfn, on 11/19/21 showed: grade 2 invasive mammary carcinoma measuring 1.1 cm, and mammary carcinoma in-situ. Right retroareolar biopsy of the intraductal mass also showed invasive mammary carcinoma, measuring 0.9 cm.   Prognostic indicators significant for: estrogen receptor, 60% positive with strong-moderate staining intensity and progesterone receptor, 60% positive with strong staining intensity. Proliferation marker Ki67 at 15%. HER2 positive.  Menarche: 51 years old Age at first live birth: 51 years  old GP: 1 LMP: still having periods, 11/30/21 Contraceptive: yes, sometime in the 1990's HRT: never used   The patient was referred today for presentation in the multidisciplinary conference.  Radiology studies and pathology slides were presented there for review and discussion of treatment options.  A consensus was discussed regarding potential next steps.  PREVIOUS RADIATION THERAPY: No  PAST MEDICAL HISTORY:  Past Medical History:  Diagnosis Date   Depression    Fibroid January 13, 2014   8 mm   History of posttraumatic stress disorder (PTSD) 01-14-08   due to maternal death-MI   PTSD (post-traumatic stress disorder)    mother's death January 14, 2008; seizure and AMI while driving, patient performed CPR x 45 minutes   Seropositive for herpes simplex 2 infection Jan 14, 2016    PAST SURGICAL HISTORY: Past Surgical History:  Procedure Laterality Date   BLADDER SURGERY  1978   nocturnal enuresis treatment   CESAREAN SECTION  13-Jan-2006   Dr Quincy Simmonds   DILATATION & CURETTAGE/HYSTEROSCOPY WITH MYOSURE N/A 01/06/2016   Procedure: DILATATION & CURETTAGE/HYSTEROSCOPY WITH MYOSURE;  Surgeon: Nunzio Cobbs, MD;  Location: [facility];  Service: Gynecology;  Laterality: N/A;   DILATION AND CURETTAGE OF UTERUS  1990   MAB?    FAMILY HISTORY:  Family History  Problem Relation Age of Onset   Pulmonary disease Mother    Heart disease Mother    Cancer - Cervical Mother        Cervical    Cancer Maternal Grandmother        "Female", unk   Diabetes Maternal Grandmother    Cancer Paternal Grandmother        "Female"   Cancer  Maternal Aunt        "Female", unk    Cancer Paternal Aunt        "Female" unk    Breast cancer Cousin     SOCIAL HISTORY:  Social History   Socioeconomic History   Marital status: Single    Spouse name: n/a   Number of children: 1   Years of education: Not on file   Highest education level: Not on file  Occupational History   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   Lives with her son.   Lost her job suddenly 08/20/2016 with job cuts at work.   Social Determinants of Health   Financial Resource Strain: Not on file  Food Insecurity: Not on file  Transportation Needs: Not on file  Physical Activity: Not on file  Stress: Not on file  Social Connections: Not on file    ALLERGIES:  Allergies  Allergen Reactions   Albuterol     Too jottery   Codeine Nausea And Vomiting    MEDICATIONS:  Current Outpatient Medications  Medication Sig Dispense Refill   clonazePAM (KLONOPIN) 0.5 MG disintegrating tablet Take 1 tablet (0.5 mg total) by mouth 2 (two) times daily. 30 tablet 0   dexamethasone (DECADRON) 4 MG tablet Take 1 tablet (4 mg total) by mouth daily. Take 1 tablet day before chemo and 1 tablet day after chemo with food 12 tablet 0   FLUoxetine (PROZAC) 20 MG capsule Take 1 capsule (20 mg total) by mouth daily. 90 capsule 0   lidocaine-prilocaine (EMLA) cream Apply to affected area once 30 g 3   meloxicam (MOBIC) 7.5 MG tablet Take 1 tablet (7.5 mg total) by mouth at bedtime as needed for pain. 90 tablet 3   ondansetron (ZOFRAN) 8 MG tablet Take 1 tablet (8 mg total) by mouth 2 (two) times daily as needed (Nausea or vomiting). Start on the third day after chemotherapy. 30 tablet 1   predniSONE (STERAPRED UNI-PAK 21 TAB) 10 MG (21) TBPK tablet Take per package instructions. Do not skip doses. Finish entire supply. 1 each 0    prochlorperazine (COMPAZINE) 10 MG tablet Take 1 tablet (10 mg total) by mouth every 6 (six) hours as needed (Nausea or vomiting). 30 tablet 1   traZODone (DESYREL) 50 MG tablet Take 0.5-1 tablets (25-50 mg total) by mouth at bedtime as needed for sleep. 30 tablet 3   No current facility-administered medications for this encounter.    REVIEW OF SYSTEMS: A 10+ POINT REVIEW OF SYSTEMS WAS OBTAINED including neurology, dermatology, psychiatry, cardiac, respiratory, lymph, extremities, GI, GU, musculoskeletal, constitutional, reproductive, HEENT. On the provided form, she reports night sweats, foot pain, wearing glasses, dental problems, palpitations, abdominal pain, breast lump, breast pain, back pain, joint pain, headaches, and anxiety. She denies any other symptoms.    PHYSICAL EXAM:    Vitals  with BMI 12/02/2021  Height _0   Weight 196 lbs 3 oz  BMI 95.28  Systolic 413  Diastolic 79  Pulse 82    Lungs are clear to auscultation bilaterally. Heart has regular rate and rhythm. No palpable cervical, supraclavicular, or axillary adenopathy. Abdomen soft, non-tender, normal bowel sounds. Breast: Left breast with no palpable mass, nipple discharge, or bleeding. Right breast with some bruising in the upper outer aspect, with palpable induration measuring approximately 3-4 cm in size.   KPS = 90  100 - Normal; no complaints; no evidence of disease. 90   - Able to carry on normal activity; minor signs or symptoms of disease. 80   - Normal activity with effort; some signs or symptoms of disease. 47   - Cares for self; unable to carry on normal activity or to do active work. 60   - Requires occasional assistance, but is able to care for most of his personal needs. 50   - Requires considerable assistance and frequent medical care. 34   - Disabled; requires special care and assistance. 49   - Severely disabled; hospital admission is indicated although death not imminent. 108   - Very sick; hospital  admission necessary; active supportive treatment necessary. 10   - Moribund; fatal processes progressing rapidly. 0     - Dead  Karnofsky DA, Abelmann Marshall, Craver LS and Burchenal JH [phone]) The use of the nitrogen mustards in the palliative treatment of carcinoma: with particular reference to bronchogenic carcinoma Cancer 1 [phone]  LABORATORY DATA:  Lab Results  Component Value Date   WBC 6.2 12/02/2021   HGB 13.5 12/02/2021   HCT 39.7 12/02/2021   MCV 97.5 12/02/2021   PLT 200 12/02/2021   Lab Results  Component Value Date   NA 141 12/02/2021   K 4.1 12/02/2021   CL 109 12/02/2021   CO2 25 12/02/2021   Lab Results  Component Value Date   ALT 24 12/02/2021   AST 30 12/02/2021   ALKPHOS 41 12/02/2021   BILITOT 0.4 12/02/2021    PULMONARY FUNCTION TEST:   Recent Review Flowsheet Data   There is no flowsheet data to display.     RADIOGRAPHY: No results found.    IMPRESSION: Stage IB (cT2, cN0, cM0) Right Breast UOQ, Invasive and in-situ mammary carcinoma, ER+ / PR+ / Her2+, Grade 2     Patient will be a good candidate for breast conservation with radiotherapy to the right breast. We discussed the general course of radiation, potential side effects, and toxicities with radiation and the patient is interested in this approach.   Given the size of the tumor she is felt to benefit from neoadjuvant chemotherapy to shrink the tumor.   PLAN:  Genetics  MRI Research referral for potential nausea study 3    Port placement / echo / chemo class  4    Neoadjuvant chemotherapy  5.   Right lumpectomy and SLN 6 .  Adjuvant radiation  7    Aromatase inhibitor    ------------------------------------------------  Blair Promise, PhD, MD  This document serves as a record of services personally performed by Gery Pray, MD. It was created on his behalf by Roney Mans, a trained medical scribe. The creation of this record is based on the scribe's personal observations and the  provider's statements to them. This document has been checked and approved by the attending provider.

## 2021-12-01 NOTE — Progress Notes (Addendum)
[place] NOTE  Patient Care Team: [name], NP as PCP - General (Adult Health Nurse Practitioner) Rockwell Germany, RN as Oncology Nurse Navigator Mauro Kaufmann, RN as Oncology Nurse Navigator Coralie Keens, MD as Consulting Physician (General Surgery) Nicholas Lose, MD as Consulting Physician (Hematology and Oncology) Gery Pray, MD as Consulting Physician (Radiation Oncology)  CHIEF COMPLAINTS/PURPOSE OF CONSULTATION:  Newly diagnosed right breast cancer  HISTORY OF PRESENTING ILLNESS:  Julie Atkinson 51 y.o. female is here because of recent diagnosis of invasive mammary carcinoma of the right breast. Diagnostic mammogram and [place] on 11/17/2021 showed 2.2 cm x 2.2 cm irregular mass with a spiculated margin in the upper outer right breast. Biopsy on 11/19/2021 showed invasive mammary carcinoma, ER/PR+(60%). She presents to the clinic today for initial evaluation and discussion of treatment options.   I reviewed her records extensively and collaborated the history with the patient.  SUMMARY OF ONCOLOGIC HISTORY: Oncology History  Malignant neoplasm of upper-outer quadrant of right female breast ([place])  11/19/2021 Initial Diagnosis   Palpable lump for 3 weeks, diagnostic mammogram: 2.2 cm x 2.2 cm irregular mass with a spiculated margin in the upper outer right breast. 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 HER2 positive, Ki-67 15%   12/02/2021 Cancer Staging   Staging form: Breast, AJCC 8th Edition - Clinical stage from 12/02/2021: Stage IB (cT2, cN0, cM0, G2, ER+, PR+, HER2+) - Signed by Nicholas Lose, MD on 12/02/2021 Stage prefix: Initial diagnosis Histologic grading system: 3 grade system      MEDICAL HISTORY:  Past Medical History:  Diagnosis Date   Depression    Fibroid 01/11/14   8 mm   History of posttraumatic stress disorder (PTSD) 01/12/08   due to maternal death-MI   PTSD (post-traumatic stress disorder)    mother's death  2008-01-12; seizure and AMI while driving, patient performed CPR x 45 minutes   Seropositive for herpes simplex 2 infection 2016-01-12    SURGICAL HISTORY: Past Surgical History:  Procedure Laterality Date   BLADDER SURGERY  1978   nocturnal enuresis treatment   CESAREAN SECTION  01/11/2006   Dr Quincy Simmonds   DILATATION & CURETTAGE/HYSTEROSCOPY WITH MYOSURE N/A 01/06/2016   Procedure: DILATATION & CURETTAGE/HYSTEROSCOPY WITH Jacklynn Barnacle;  Surgeon: Nunzio Cobbs, MD;  Location: [facility];  Service: Gynecology;  Laterality: N/A;   DILATION AND CURETTAGE OF UTERUS  1990   MAB?    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   Lives with her son.   Lost her job suddenly 08/20/2016 with job cuts at work.  Social Determinants of Health   Financial Resource Strain: Not on file  Food Insecurity: Not on file  Transportation Needs: Not on file  Physical Activity: Not on file  Stress: Not on file  Social Connections: Not on file  Intimate Partner Violence: Not on file    FAMILY HISTORY: Family History  Problem Relation Age of Onset   Pulmonary disease Mother    Heart disease Mother    Cancer - Cervical Mother        Cervical    Cancer Maternal Aunt         "Female", unk    Cancer Paternal Aunt        "Female" unk    Cancer Maternal Grandmother        "Female", unk   Diabetes Maternal Grandmother    Cancer Paternal Grandmother        "Female"    ALLERGIES:  is allergic to albuterol and codeine.  MEDICATIONS:  Current Outpatient Medications  Medication Sig Dispense Refill   clonazePAM (KLONOPIN) 0.5 MG disintegrating tablet Take 1 tablet (0.5 mg total) by mouth 2 (two) times daily. 30 tablet 0   FLUoxetine (PROZAC) 20 MG capsule Take 1 capsule (20 mg total) by mouth daily. 90 capsule 0   meloxicam (MOBIC) 7.5 MG tablet Take 1 tablet (7.5 mg total) by mouth at bedtime as needed for pain. 90 tablet 3   predniSONE (STERAPRED UNI-PAK 21 TAB) 10 MG (21) TBPK tablet Take per package instructions. Do not skip doses. Finish entire supply. 1 each 0   traZODone (DESYREL) 50 MG tablet Take 0.5-1 tablets (25-50 mg total) by mouth at bedtime as needed for sleep. 30 tablet 3   No current facility-administered medications for this visit.    REVIEW OF SYSTEMS:   Constitutional: Denies fevers, chills or abnormal night sweats Eyes: Denies blurriness of vision, double vision or watery eyes Ears, nose, mouth, throat, and face: Denies mucositis or sore throat Respiratory: Denies cough, dyspnea or wheezes Cardiovascular: Denies palpitation, chest discomfort or lower extremity swelling Gastrointestinal:  Denies nausea, heartburn or change in bowel habits Skin: Denies abnormal skin rashes Lymphatics: Denies new lymphadenopathy or easy bruising Neurological:Denies numbness, tingling or new weaknesses Behavioral/Psych: Mood is stable, no new changes  Breast: Palpable lump in the right breast All other systems were reviewed with the patient and are negative.  PHYSICAL EXAMINATION: ECOG PERFORMANCE STATUS: 1 - Symptomatic but completely ambulatory  Vitals:   12/02/21 1232  BP: 137/79  Pulse: 82  Resp: 18  Temp: 98.1 F (36.7 C)  SpO2: 100%   Filed  Weights   12/02/21 1232  Weight: 196 lb 3.2 oz (89 kg)       LABORATORY DATA:  I have reviewed the data as listed Lab Results  Component Value Date   WBC 6.2 12/02/2021   HGB 13.5 12/02/2021   HCT 39.7 12/02/2021   MCV 97.5 12/02/2021   PLT 200 12/02/2021   Lab Results  Component Value Date   NA 141 12/02/2021   K 4.1 12/02/2021   CL 109 12/02/2021   CO2 25 12/02/2021    RADIOGRAPHIC STUDIES: I have personally reviewed the radiological reports and agreed with the findings in the report.  ASSESSMENT AND PLAN:  Malignant neoplasm of upper-outer quadrant of right female breast ([place]) 11/19/2021:Palpable lump for 3 weeks, diagnostic mammogram: 2.2 cm x 2.2 cm irregular mass with a spiculated margin in the upper outer right breast. 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  HER2 positive, Ki-67 15%  Pathology and radiology counseling: Discussed with the patient, the details of pathology including the type of breast cancer,the clinical staging, the significance of ER, PR and HER-2/neu receptors and the implications for treatment. After reviewing the pathology in detail, we proceeded to discuss the different treatment options between surgery, radiation, chemotherapy, antiestrogen therapies.  Recommendation based on multidisciplinary tumor board: 1. Neoadjuvant chemotherapy with TCHP foll by HP vs Kadcyla maintenance 2. Followed by breast conserving surgery with sentinel lymph node study vs targeted axillary dissection 3. Followed by adjuvant radiation therapy  Chemotherapy Counseling: I discussed the risks and benefits of chemotherapy including the risks of nausea/ vomiting, risk of infection from low WBC count, fatigue due to chemo or anemia, bruising or bleeding due to low platelets, mouth sores, loss/ change in taste and decreased appetite. Liver and kidney function will be monitored through out chemotherapy as abnormalities in liver and kidney function may be a side effect of  treatment.  Peripheral neuropathy due to Taxol and cardiac dysfunction due to herceptin was discussed in detail. Risk of permanent bone marrow dysfunction due to chemo were also discussed.  Plan: 1. Port placement to be done next Monday 2. Echocardiogram 3. Chemotherapy class 4. Breast MRI Genetic counseling will also be arranged  [place]: Treatment of refractory nausea.  After first cycle of chemo if patient experience chemo induced nausea and vomiting the randomized from cycle 2 to Aloxi plus Dex plus olanzapine or placebo plus Compazine or placebo plus placebo prior to chemo and take home medications for day 2 today for of Dex plus olanzapine or placebo and Compazine or placebo every 8 hours.  If patient does not have nausea after cycle 1, then the trial is complete.  Return to clinic in 2 weeks to start chemotherapy.   All questions were answered. The patient knows to call the clinic with any problems, questions or concerns.   Rulon Eisenmenger, MD, MPH 12/02/2021    I, Thana Ates, am acting as scribe for Nicholas Lose, MD.  I have reviewed the above documentation for accuracy and completeness, and I agree with the above.

## 2021-12-02 ENCOUNTER — Other Ambulatory Visit: Payer: Self-pay | Admitting: *Deleted

## 2021-12-02 ENCOUNTER — Inpatient Hospital Stay: Payer: BC Managed Care – PPO | Attending: Hematology and Oncology

## 2021-12-02 ENCOUNTER — Other Ambulatory Visit: Payer: Self-pay

## 2021-12-02 ENCOUNTER — Inpatient Hospital Stay (HOSPITAL_BASED_OUTPATIENT_CLINIC_OR_DEPARTMENT_OTHER): Payer: BC Managed Care – PPO | Admitting: Hematology and Oncology

## 2021-12-02 ENCOUNTER — Encounter: Payer: Self-pay | Admitting: *Deleted

## 2021-12-02 ENCOUNTER — Ambulatory Visit: Payer: BC Managed Care – PPO | Attending: Surgery | Admitting: Physical Therapy

## 2021-12-02 ENCOUNTER — Other Ambulatory Visit: Payer: Self-pay | Admitting: Surgery

## 2021-12-02 ENCOUNTER — Encounter: Payer: Self-pay | Admitting: Physical Therapy

## 2021-12-02 ENCOUNTER — Ambulatory Visit
Admission: RE | Admit: 2021-12-02 | Discharge: 2021-12-02 | Disposition: A | Discharge status: Home / Self Care | Payer: BC Managed Care – PPO | Source: Ambulatory Visit | Attending: Radiation Oncology | Admitting: Radiation Oncology

## 2021-12-02 VITALS — BP 137/79 | HR 82 | Temp 98.1°F | Resp 18 | Ht 67.0 in | Wt 196.2 lb

## 2021-12-02 DIAGNOSIS — Z8049 Family history of malignant neoplasm of other genital organs: Secondary | ICD-10-CM | POA: Insufficient documentation

## 2021-12-02 DIAGNOSIS — Z170 Estrogen receptor positive status [ER+]: Secondary | ICD-10-CM | POA: Diagnosis not present

## 2021-12-02 DIAGNOSIS — Z87891 Personal history of nicotine dependence: Secondary | ICD-10-CM

## 2021-12-02 DIAGNOSIS — C50411 Malignant neoplasm of upper-outer quadrant of right female breast: Secondary | ICD-10-CM

## 2021-12-02 DIAGNOSIS — R293 Abnormal posture: Secondary | ICD-10-CM | POA: Diagnosis not present

## 2021-12-02 DIAGNOSIS — Z79899 Other long term (current) drug therapy: Secondary | ICD-10-CM | POA: Insufficient documentation

## 2021-12-02 DIAGNOSIS — C50911 Malignant neoplasm of unspecified site of right female breast: Secondary | ICD-10-CM | POA: Diagnosis not present

## 2021-12-02 LAB — CMP (CANCER CENTER ONLY)
ALT: 24 U/L (ref 0–44)
AST: 30 U/L (ref 15–41)
Albumin: 4.2 g/dL (ref 3.5–5.0)
Alkaline Phosphatase: 41 U/L (ref 38–126)
Anion gap: 7 (ref 5–15)
BUN: 11 mg/dL (ref 6–20)
CO2: 25 mmol/L (ref 22–32)
Calcium: 9.1 mg/dL (ref 8.9–10.3)
Chloride: 109 mmol/L (ref 98–111)
Creatinine: 0.75 mg/dL (ref 0.44–1.00)
GFR, Estimated: >60 mL/min (ref >60)
Glucose, Bld: 105 mg/dL — ABNORMAL HIGH (ref 70–99)
Potassium: 4.1 mmol/L (ref 3.5–5.1)
Sodium: 141 mmol/L (ref 135–145)
Total Bilirubin: 0.4 mg/dL (ref 0.3–1.2)
Total Protein: 7 g/dL (ref 6.5–8.1)

## 2021-12-02 LAB — CBC WITH DIFFERENTIAL (CANCER CENTER ONLY)
Abs Immature Granulocytes: 0.01 10*3/uL (ref 0.00–0.07)
Basophils Absolute: 0.1 10*3/uL (ref 0.0–0.1)
Basophils Relative: 1 %
Eosinophils Absolute: 0 10*3/uL (ref 0.0–0.5)
Eosinophils Relative: 1 %
HCT: 39.7 % (ref 36.0–46.0)
Hemoglobin: 13.5 g/dL (ref 12.0–15.0)
Immature Granulocytes: 0 %
Lymphocytes Relative: 24 %
Lymphs Abs: 1.5 10*3/uL (ref 0.7–4.0)
MCH: 33.2 pg (ref 26.0–34.0)
MCHC: 34 g/dL (ref 30.0–36.0)
MCV: 97.5 fL (ref 80.0–100.0)
Monocytes Absolute: 0.5 10*3/uL (ref 0.1–1.0)
Monocytes Relative: 8 %
Neutro Abs: 4.2 10*3/uL (ref 1.7–7.7)
Neutrophils Relative %: 66 %
Platelet Count: 200 10*3/uL (ref 150–400)
RBC: 4.07 MIL/uL (ref 3.87–5.11)
RDW: 12 % (ref 11.5–15.5)
WBC Count: 6.2 10*3/uL (ref 4.0–10.5)
nRBC: 0 % (ref 0.0–0.2)

## 2021-12-02 LAB — GENETIC SCREENING ORDER

## 2021-12-02 MED ORDER — PROCHLORPERAZINE MALEATE 10 MG PO TABS: 10.0000 mg | ORAL_TABLET | Freq: Four times a day (QID) | ORAL | 1 refills | Status: DC | PRN

## 2021-12-02 MED ORDER — LIDOCAINE-PRILOCAINE 2.5-2.5 % EX CREA: TOPICAL_CREAM | CUTANEOUS | 3 refills | Status: DC

## 2021-12-02 MED ORDER — ONDANSETRON HCL 8 MG PO TABS: 8.0000 mg | ORAL_TABLET | Freq: Two times a day (BID) | ORAL | 1 refills | Status: DC | PRN

## 2021-12-02 MED ORDER — DEXAMETHASONE 4 MG PO TABS: 4.0000 mg | ORAL_TABLET | Freq: Every day | ORAL | 0 refills | Status: DC

## 2021-12-02 NOTE — Research (Signed)
Trial:  [id number] - TREATMENT OF REFRACTORY NAUSEA Patient Julie Atkinson was identified by Dr. Lindi Adie as a potential candidate for the above listed study.  This Clinical Research Nurse met with TANISIA YOKLEY, [id number], on 12/02/21 in a manner and location that ensures patient privacy to discuss participation in the above listed research study.  Patient is Accompanied by her mother in law, [name] .  A copy of the informed consent document and separate HIPAA Authorization was provided to the patient.  Patient reads, speaks, and understands [place].   Patient was provided with the business card of this Nurse and encouraged to contact the research team with any questions.  Approximately 5 minutes were spent with the patient reviewing the informed consent documents.  Patient was provided the option of taking informed consent documents home to review and was encouraged to review at their convenience with their support network, including other care providers. Patient took the consent documents home to review. Foye Spurling, BSN, RN, [employer] Clinical Research Nurse 12/02/2021 4:00 PM

## 2021-12-02 NOTE — Therapy (Signed)
OUTPATIENT PHYSICAL THERAPY BREAST CANCER BASELINE EVALUATION   Patient Name: Julie Atkinson MRN: [id number] DOB:[birth year], 51 y.o., female Today's Date: December 16, 2021   PT End of Session - Dec 16, 2021 1622     Visit Number 1    Number of Visits 2    Date for PT Re-Evaluation 06/01/22    PT Start Time 9163    PT Stop Time 1457    PT Time Calculation (min) 29 min    Activity Tolerance Patient tolerated treatment well    Behavior During Therapy [facility] for tasks assessed/performed             Past Medical History:  Diagnosis Date   Depression    Fibroid December 16, 2013   8 mm   History of posttraumatic stress disorder (PTSD) Dec 17, 2007   due to maternal death-MI   PTSD (post-traumatic stress disorder)    mother's death 12-17-07; seizure and AMI while driving, patient performed CPR x 45 minutes   Seropositive for herpes simplex 2 infection 12/17/15   Past Surgical History:  Procedure Laterality Date   BLADDER SURGERY  1978   nocturnal enuresis treatment   CESAREAN SECTION  2005-12-16   Dr Quincy Simmonds   DILATATION & CURETTAGE/HYSTEROSCOPY WITH MYOSURE N/A 01/06/2016   Procedure: DILATATION & CURETTAGE/HYSTEROSCOPY WITH Jacklynn Barnacle;  Surgeon: Nunzio Cobbs, MD;  Location: [facility];  Service: Gynecology;  Laterality: N/A;   DILATION AND CURETTAGE OF UTERUS  1990   MAB?   Patient Active Problem List   Diagnosis Date Noted   Malignant neoplasm of upper-outer quadrant of right female breast ([place]) 12/01/2021   Breast lump on right side at 10 o'clock position 10/21/2021   PTSD (post-traumatic stress disorder)     PCP: [name], NP  REFERRING PROVIDER: Coralie Keens, MD  REFERRING DIAG: Right breast cancer  THERAPY DIAG:  Malignant neoplasm of upper-outer quadrant of right breast in female, estrogen receptor positive ([place])  Abnormal posture  ONSET DATE: 11/17/2021  SUBJECTIVE                                                                                                                                                                                            SUBJECTIVE STATEMENT: Patient reports she is here today to be seen by her medical team for her newly diagnosed right breast cancer.   PERTINENT HISTORY:  Patient was diagnosed on 11/17/2021 with right grade grade II invasive ductal and lobular carcinoma breast cancer.. It measures 2.4 cm and is located in the upper outer quadrant. It is triple positive with a Ki67 of 15%.   PATIENT GOALS   reduce lymphedema  risk and learn post op HEP.   PAIN:  Are you having pain? No She reports that she has right achilles tendonitis but has no pain presently.  PRECAUTIONS: Active CA  WEIGHT BEARING RESTRICTIONS No  FALLS:  Has patient fallen in last 6 months? No, Number of falls: 0  LIVING ENVIRONMENT: Patient lives with: her 64 y.o. son Lives in: House/apartment Has following equipment at home: None  OCCUPATION: full time Artist; on computer  LEISURE: She goes to the gym and lifts weights and rides a stationary bike for 7 miles (30 min) several times per week  PRIOR LEVEL OF FUNCTION: Independent   OBJECTIVE  COGNITION:  Overall cognitive status: Within functional limits for tasks assessed    POSTURE:  Forward head and rounded shoulders posture  UPPER EXTREMITY AROM/PROM:  A/PROM Right 12/02/2021 Left 12/02/2021  Shoulder extension 47 49  Shoulder flexion 150 146  Shoulder abduction 157 160  Shoulder internal rotation 60 60  Shoulder external rotation 84 85    (Blank rows = not tested)    CERVICAL AROM: All within normal limits  UPPER EXTREMITY STRENGTH: WNL   LYMPHEDEMA ASSESSMENTS:   LANDMARK RIGHT 12/02/2021 LEFT 12/02/2021  10 cm proximal to olecranon process 31.1 32.8  Olecranon process 26.3 26.7  10 cm proximal to ulnar styloid process 23.6 22.2  Just proximal to ulnar styloid process 16.4 16.3  Across hand at thumb web space 19.9 19.3  At base of 2nd digit 6.5 6.3  (Blank rows =  not tested)   L-DEX LYMPHEDEMA SCREENING:  The patient was assessed using the L-Dex machine today to produce a lymphedema index baseline score. The patient will be reassessed on a regular basis (typically every 3 months) to obtain new L-Dex scores. If the score is > 6.5 points away from his/her baseline score indicating onset of subclinical lymphedema, it will be recommended to wear a compression garment for 4 weeks, 12 hours per day and then be reassessed. If the score continues to be > 6.5 points from baseline at reassessment, we will initiate lymphedema treatment. Assessing in this manner has a 95% rate of preventing clinically significant lymphedema.   L-DEX FLOWSHEETS - 12/02/21 1600       L-DEX LYMPHEDEMA SCREENING   Measurement Type Unilateral    L-DEX MEASUREMENT EXTREMITY Upper Extremity    POSITION  Standing    DOMINANT SIDE Right    At Risk Side Right    BASELINE SCORE (UNILATERAL) -1.9             QUICK DASH SURVEY:  Katina Dung - 12/02/21 0001     Open a tight or new jar Mild difficulty    Do heavy household chores (wash walls, wash floors) No difficulty    Carry a shopping bag or briefcase No difficulty    Wash your back No difficulty    Use a knife to cut food No difficulty    Recreational activities in which you take some force or impact through your arm, shoulder, or hand (golf, hammering, tennis) No difficulty    During the past week, to what extent has your arm, shoulder or hand problem interfered with your normal social activities with family, friends, neighbors, or groups? Not at all    During the past week, to what extent has your arm, shoulder or hand problem limited your work or other regular daily activities Not at all    Arm, shoulder, or hand pain. None    Tingling (pins and  needles) in your arm, shoulder, or hand Mild    Difficulty Sleeping No difficulty    DASH Score 4.55 %              PATIENT EDUCATION:  Education details: Lymphedema risk  reduction and post op shoulder/posture HEP Person educated: Patient Education method: Explanation, Demonstration, Handout Education comprehension: Patient verbalized understanding and returned demonstration   HOME EXERCISE PROGRAM: Patient was instructed today in a home exercise program today for post op shoulder range of motion. These included active assist shoulder flexion in sitting, scapular retraction, wall walking with shoulder abduction, and hands behind head external rotation.  She was encouraged to do these twice a day, holding 3 seconds and repeating 5 times when permitted by her physician.   ASSESSMENT:  CLINICAL IMPRESSION: Patient was diagnosed on 11/17/2021 with right grade grade II invasive ductal and lobular carcinoma breast cancer.. It measures 2.4 cm and is located in the upper outer quadrant. It is triple positive with a Ki67 of 15%. Her multidisciplinary medical team met prior to her assessments to determine a recommended treatment plan. She is planning to have a right lumpectomy and sentinel node biopsy followed by radiation and anti-estrogen therapy. She will benefit from a post op PT reassessment to determine needs and from L-Dex screens every 3 months for 2 years to detect subclinical lymphedema.  Pt will benefit from skilled therapeutic intervention to improve on the following deficits: Decreased knowledge of precautions, impaired UE functional use, pain, decreased ROM, postural dysfunction.   PT treatment/interventions: ADL/self-care home management, pt/family education, therapeutic exercise  REHAB POTENTIAL: Excellent  CLINICAL DECISION MAKING: Stable/uncomplicated  EVALUATION COMPLEXITY: Low   GOALS: Goals reviewed with patient? YES  LONG TERM GOALS: (STG=LTG)   Name Target Date Goal status  1 Pt will be able to verbalize understanding of pertinent lymphedema risk reduction practices relevant to her dx specifically related to skin care.  Baseline:  No  knowledge 12/02/2021 Achieved at eval  2 Pt will be able to return demo and/or verbalize understanding of the post op HEP related to regaining shoulder ROM. Baseline:  No knowledge 12/02/2021 Achieved at eval  3 Pt will be able to verbalize understanding of the importance of attending the post op After Breast CA Class for further lymphedema risk reduction education and therapeutic exercise.  Baseline:  No knowledge 12/02/2021 Achieved at eval  4 Pt will demo she has regained full shoulder ROM and function post operatively compared to baselines.  Baseline: See objective measurements taken today. 06/01/2022      PLAN: PT FREQUENCY/DURATION: EVAL and 1 follow up appointment.   PLAN FOR NEXT SESSION: will reassess 3-4 weeks post op to determine needs.   Patient will follow up at outpatient cancer rehab 3-4 weeks following surgery.  If the patient requires physical therapy at that time, a specific plan will be dictated and sent to the referring physician for approval. The patient was educated today on appropriate basic range of motion exercises to begin post operatively and the importance of attending the After Breast Cancer class following surgery.  Patient was educated today on lymphedema risk reduction practices as it pertains to recommendations that will benefit the patient immediately following surgery.  She verbalized good understanding.    Physical Therapy Information for After Breast Cancer Surgery/Treatment:  Lymphedema is a swelling condition that you may be at risk for in your arm if you have lymph nodes removed from the armpit area.  After a sentinel node biopsy, the  risk is approximately 5-9% and is higher after an axillary node dissection.  There is treatment available for this condition and it is not life-threatening.  Contact your physician or physical therapist with concerns. You may begin the 4 shoulder/posture exercises (see additional sheet) when permitted by your physician (typically  a week after surgery).  If you have drains, you may need to wait until those are removed before beginning range of motion exercises.  A general recommendation is to not lift your arms above shoulder height until drains are removed.  These exercises should be done to your tolerance and gently.  This is not a "no pain/no gain" type of recovery so listen to your body and stretch into the range of motion that you can tolerate, stopping if you have pain.  If you are having immediate reconstruction, ask your plastic surgeon about doing exercises as he or she may want you to wait. We encourage you to attend the free one time ABC (After Breast Cancer) class offered by [facility].  You will learn information related to lymphedema risk, prevention and treatment and additional exercises to regain mobility following surgery.  You can call [phone] for more information.  This is offered the 1st and 3rd Monday of each month.  You only attend the class one time. While undergoing any medical procedure or treatment, try to avoid blood pressure being taken or needle sticks from occurring on the arm on the side of cancer.   This recommendation begins after surgery and continues for the rest of your life.  This may help reduce your risk of getting lymphedema (swelling in your arm). An excellent resource for those seeking information on lymphedema is the National Lymphedema Network's web site. It can be accessed at [place].org If you notice swelling in your hand, arm or breast at any time following surgery (even if it is many years from now), please contact your doctor or physical therapist to discuss this.  Lymphedema can be treated at any time but it is easier for you if it is treated early on.  If you feel like your shoulder motion is not returning to normal in a reasonable amount of time, please contact your surgeon or physical therapist.  [facility] [place], [place], [place] [phone]; [address], [place], [place] [address] ABC CLASS After Breast Cancer Class  After Breast Cancer Class is a specially designed exercise class to assist you in a safe recover after having breast cancer surgery.  In this class you will learn how to get back to full function whether your drains were just removed or if you had surgery a month ago.  This one-time class is held the 1st and 3rd Monday of every month from 11:00 a.m. until 12:00 noon at the [facility] located at [place], [place] [address]  This class is FREE and space is limited. For more information or to register for the next available class, call [phone].  Class Goals  Understand specific stretches to improve the flexibility of you chest and shoulder. Learn ways to safely strengthen your upper body and improve your posture. Understand the warning signs of infection and why you may be at risk for an arm infection. Learn about Lymphedema and prevention.  ** You do not attend this class until after surgery.  Drains must be removed to participate  Patient was instructed today in a home exercise program today for post op shoulder range of  motion. These included active assist shoulder flexion in sitting, scapular retraction, wall walking with shoulder abduction, and hands behind head external rotation.  She was encouraged to do these twice a day, holding 3 seconds and repeating 5 times when permitted by her physician.   Annia Friendly, [place] 12/02/21 4:30 PM

## 2021-12-02 NOTE — Progress Notes (Signed)
START ON PATHWAY REGIMEN - Breast     Cycle 1: A cycle is 21 days:     Pertuzumab      Trastuzumab-xxxx      Docetaxel      Carboplatin    Cycles 2 through 6: A cycle is every 21 days:     Pertuzumab      Trastuzumab-xxxx      Docetaxel      Carboplatin   **Always confirm dose/schedule in your pharmacy ordering system**  Patient Characteristics: Preoperative or Nonsurgical Candidate (Clinical Staging), Neoadjuvant Therapy followed by Surgery, Invasive Disease, Chemotherapy, HER2 Positive, ER Positive Therapeutic Status: Preoperative or Nonsurgical Candidate (Clinical Staging) AJCC M Category: cM0 AJCC Grade: G2 Breast Surgical Plan: Neoadjuvant Therapy followed by Surgery ER Status: Positive (+) AJCC 8 Stage Grouping: IB HER2 Status: Positive (+) AJCC T Category: cT2 AJCC N Category: cN0 PR Status: Positive (+) Intent of Therapy: Curative Intent, Discussed with Patient

## 2021-12-02 NOTE — Assessment & Plan Note (Addendum)
11/19/2021:Palpable lump for 3 weeks, diagnostic mammogram: 2.2 cm x 2.2 cm irregular mass with a spiculated margin in the upper outer right breast. 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 HER2 positive, Ki-67 15%  Pathology and radiology counseling: Discussed with the patient, the details of pathology including the type of breast cancer,the clinical staging, the significance of ER, PR and HER-2/neu receptors and the implications for treatment. After reviewing the pathology in detail, we proceeded to discuss the different treatment options between surgery, radiation, chemotherapy, antiestrogen therapies.  Recommendation based on multidisciplinary tumor board: 1. Neoadjuvant chemotherapy with TCHP foll by HP vs Kadcyla maintenance 2. Followed by breast conserving surgery with sentinel lymph node study vs targeted axillary dissection 3. Followed by adjuvant radiation therapy  Chemotherapy Counseling: I discussed the risks and benefits of chemotherapy including the risks of nausea/ vomiting, risk of infection from low WBC count, fatigue due to chemo or anemia, bruising or bleeding due to low platelets, mouth sores, loss/ change in taste and decreased appetite. Liver and kidney function will be monitored through out chemotherapy as abnormalities in liver and kidney function may be a side effect of treatment.  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 Risk of permanent bone marrow dysfunction due to chemo were also discussed.  Plan: 1. Port placement to be done next Monday 2. Echocardiogram 3. Chemotherapy class 4. Breast MRI Genetic counseling will also be arranged  [place]: Treatment of refractory nausea.  After first cycle of chemo if patient experience chemo induced nausea and vomiting the randomized from cycle 2 to Aloxi plus Dex plus olanzapine or placebo plus Compazine or placebo plus placebo prior to chemo and take  home medications for day 2 today for of Dex plus olanzapine or placebo and Compazine or placebo every 8 hours.  If patient does not have nausea after cycle 1, then the trial is complete.  Return to clinic in 1 week to start chemotherapy.

## 2021-12-03 ENCOUNTER — Inpatient Hospital Stay: Payer: BC Managed Care – PPO | Admitting: Genetic Counselor

## 2021-12-03 DIAGNOSIS — Z170 Estrogen receptor positive status [ER+]: Secondary | ICD-10-CM

## 2021-12-03 DIAGNOSIS — C50411 Malignant neoplasm of upper-outer quadrant of right female breast: Secondary | ICD-10-CM

## 2021-12-03 DIAGNOSIS — Z8041 Family history of malignant neoplasm of ovary: Secondary | ICD-10-CM

## 2021-12-03 DIAGNOSIS — Z803 Family history of malignant neoplasm of breast: Secondary | ICD-10-CM

## 2021-12-04 ENCOUNTER — Encounter (HOSPITAL_BASED_OUTPATIENT_CLINIC_OR_DEPARTMENT_OTHER): Payer: Self-pay | Admitting: Surgery

## 2021-12-04 ENCOUNTER — Other Ambulatory Visit: Payer: Self-pay | Admitting: *Deleted

## 2021-12-04 ENCOUNTER — Telehealth: Payer: Self-pay | Admitting: Genetic Counselor

## 2021-12-04 ENCOUNTER — Telehealth: Payer: Self-pay | Admitting: *Deleted

## 2021-12-04 ENCOUNTER — Encounter: Payer: Self-pay | Admitting: Hematology and Oncology

## 2021-12-04 ENCOUNTER — Encounter: Payer: Self-pay | Admitting: Genetic Counselor

## 2021-12-04 ENCOUNTER — Encounter: Payer: Self-pay | Admitting: *Deleted

## 2021-12-04 DIAGNOSIS — Z1379 Encounter for other screening for genetic and chromosomal anomalies: Secondary | ICD-10-CM | POA: Insufficient documentation

## 2021-12-04 DIAGNOSIS — Z8041 Family history of malignant neoplasm of ovary: Secondary | ICD-10-CM

## 2021-12-04 DIAGNOSIS — Z803 Family history of malignant neoplasm of breast: Secondary | ICD-10-CM | POA: Insufficient documentation

## 2021-12-04 HISTORY — DX: Family history of malignant neoplasm of breast: Z80.3

## 2021-12-04 HISTORY — DX: Family history of malignant neoplasm of ovary: Z80.41

## 2021-12-04 MED ORDER — PREDNISONE 50 MG PO TABS: ORAL_TABLET | ORAL | 0 refills | Status: DC

## 2021-12-04 MED ORDER — LORAZEPAM 0.5 MG PO TABS: ORAL_TABLET | ORAL | 0 refills | Status: DC

## 2021-12-04 NOTE — Progress Notes (Signed)
REFERRING PROVIDER: Nicholas Lose, MD [address] [place],  [place] [address]  PRIMARY PROVIDER:  [name], NP  PRIMARY REASON FOR VISIT:  1. Malignant neoplasm of upper-outer quadrant of right breast in female, estrogen receptor positive ([place])   2. Family history of breast cancer   3. Family history of ovarian cancer     I connected with Ms. Nelson on 12/03/2021 at 12pm EDT by [place] video conference and verified that I am speaking with the correct person using two identifiers.   Patient location: office Provider location: [facility] office  HISTORY OF PRESENT ILLNESS:   Ms. Ramella, a 51 y.o. female, was seen for a [place] cancer genetics consultation at the request of Dr. Lindi Adie due to a personal and family history of breast and other cancers.  Ms. Forrey presents to clinic today to discuss the possibility of a hereditary predisposition to cancer, to discuss genetic testing, and to further clarify her future cancer risks, as well as potential cancer risks for family members.   In 01/27/22, at the age of 10, Ms. Glaus was diagnosed with invasive mammary carcinoma (mixed lobular and ductal) of the right breast (ER+/PR+/HER2+). The treatment plan is pending.    CANCER HISTORY:  Oncology History  Malignant neoplasm of upper-outer quadrant of right female breast ([place])  11/19/2021 Initial Diagnosis   Palpable lump for 3 weeks, diagnostic mammogram: 2.2 cm x 2.2 cm irregular mass with a spiculated margin in the upper outer right breast. 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 HER2 positive, Ki-67 15%   12/02/2021 Cancer Staging   Staging form: Breast, AJCC 8th Edition - Clinical stage from 12/02/2021: Stage IB (cT2, cN0, cM0, G2, ER+, PR+, HER2+) - Signed by Nicholas Lose, MD on 12/02/2021 Stage prefix: Initial diagnosis Histologic grading system: 3 grade system    12/16/2021 -  Chemotherapy   Patient is on Treatment Plan : BREAST  Docetaxel + Carboplatin +  Trastuzumab + Pertuzumab  (TCHP) q21d        RISK FACTORS:  Menarche was at age 69.  First live birth at age 50.  OCP use for approximately 0 years.  Ovaries intact: yes.  Hysterectomy: no.  Menopausal status: premenopausal.  HRT use: 0 years. Colonoscopy: no; not examined. Mammogram within the last year: yes.   Past Medical History:  Diagnosis Date   Depression    Family history of breast cancer 12/04/2021   Family history of ovarian cancer 12/04/2021   Fibroid 27-Jan-2014   8 mm   History of posttraumatic stress disorder (PTSD) 01/28/2008   due to maternal death-MI   PTSD (post-traumatic stress disorder)     Past Surgical History:  Procedure Laterality Date   BLADDER SURGERY  1977/01/27   nocturnal enuresis treatment   CESAREAN SECTION  January 27, 2006   Dr Quincy Simmonds   DILATATION & CURETTAGE/HYSTEROSCOPY WITH MYOSURE N/A 01/06/2016   Procedure: DILATATION & CURETTAGE/HYSTEROSCOPY WITH Jacklynn Barnacle;  Surgeon: Nunzio Cobbs, MD;  Location: [facility];  Service: Gynecology;  Laterality: N/A;   DILATION AND CURETTAGE OF UTERUS  1990   MAB?    FAMILY HISTORY:  We obtained a detailed, 4-generation family history.  Significant diagnoses are listed below: Family History  Problem Relation Age of Onset   Cervical cancer Mother 59   Breast cancer Maternal Aunt        dx late 25s   Lung cancer Maternal Uncle        d. 80   Bone cancer Paternal  Grandmother        primary? limited info; d. 9s   Breast cancer Cousin 35       maternal female cousin   Ovarian cancer Cousin        maternal cousin; d. 69   Cancer Other        cervical or other GYN cancer in several maternal relatives     Ms. Pech is unaware of previous family history of genetic testing for hereditary cancer risks. There is no reported Ashkenazi Jewish ancestry. There is no known consanguinity.  GENETIC COUNSELING ASSESSMENT: Ms. Snodgrass is a 51 y.o. female with a personal history of cancer which is somewhat suggestive of a hereditary  cancer syndrome and predisposition to cancer given the presence of related cancers in the family and the ages of diagnosis. We, therefore, discussed and recommended the following at today's visit.   DISCUSSION: We discussed that 5 - 10% of cancer is hereditary.  Most cases of hereditary breast cancer associated with mutations in BRCA1/2.  There are other genes that can be associated with hereditary breast cancer syndromes.  We discussed that testing is beneficial for several reasons including knowing how to follow individuals for their cancer risks, identifying whether potential treatment options would be beneficial, and understanding if other family members could be at risk for cancer and allowing them to undergo genetic testing.   We reviewed the characteristics, features and inheritance patterns of hereditary cancer syndromes. We also discussed genetic testing, including the appropriate family members to test, the process of testing, insurance coverage and turn-around-time for results. We discussed the implications of a negative, positive, carrier and/or variant of uncertain significant result. We recommended Ms. Aguinaldo pursue genetic testing for a panel that includes genes associated with breast and ovarian cancer.   Ms. Accardi  was offered a common hereditary cancer panel (36 genes) and an expanded pan-cancer panel (77 genes). Ms. Bal was informed of the benefits and limitations of each panel, including that expanded pan-cancer panels contain genes that do not have clear management guidelines at this point in time.  We also discussed that as the number of genes included on a panel increases, the chances of variants of uncertain significance increases.  After considering the benefits and limitations of each gene panel, Ms. Gadsby  elected to have a common hereditary cancers panel through [place].  She is still considering the expanded pan-cancer panel and will call before her results are returned if she  wishes to change her panel.   The CancerNext gene panel offered by [employer] includes sequencing, rearrangement analysis, and RNA analysis for the following 36 genes:   APC, ATM, AXIN2, BARD1, BMPR1A, BRCA1, BRCA2, BRIP1, CDH1, CDK4, CDKN2A, CHEK2, DICER1, HOXB13, EPCAM, GREM1, MLH1, MSH2, MSH3, MSH6, MUTYH, NBN, NF1, NTHL1, PALB2, PMS2, POLD1, POLE, PTEN, RAD51C, RAD51D, RECQL, SMAD4, SMARCA4, STK11, and TP53.   Based on Ms. Ozment's personal and family history of cancer, she meets medical criteria for genetic testing. Despite that she meets criteria, she may still have an out of pocket cost. We discussed that if her out of pocket cost for testing is over $100, the laboratory should contact her and discuss the self-pay prices and/or patient pay assistance programs.    PLAN: After considering the risks, benefits, and limitations, Ms. Dalporto provided informed consent to pursue genetic testing and the blood sample was sent to [facility] for analysis of the [place] +RNAinsight Panel. Results should be available within approximately 3 weeks' time, at which  point they will be disclosed by telephone to Ms. Partridge, as will any additional recommendations warranted by these results. Ms. Kras will receive a summary of her genetic counseling visit and a copy of her results once available. This information will also be available in Epic.   Lastly, we encouraged Ms. Renda to remain in contact with cancer genetics annually so that we can continuously update the family history and inform her of any changes in cancer genetics and testing that may be of benefit for this family.   Ms. Humble questions were answered to her satisfaction today. Our contact information was provided should additional questions or concerns arise. Thank you for the referral and allowing [place] to share in the care of your patient.   Drayven Marchena M. Joette Catching, [place], [facility] Genetic Counselor [email or web address] (P)  [phone]  The patient was seen for a total of 40 minutes in face-to-face genetic counseling.   The patient was seen alone.  Drs. Lindi Adie and/or Burr Medico were available to discuss this case as needed.    _______________________________________________________________________ For Office Staff:  Number of people involved in session: 1 Was an Intern/ student involved with case: no

## 2021-12-04 NOTE — Telephone Encounter (Signed)
Patient called to cancel genetic testing.  Feeling overwhelmed by treatment information and other life events.  Patient understands benefits of genetic testing but wants to do own research before having genetic testing.  Genetic testing cancelled.  Agreed to contact patient in one month to answer questions about genetic testing and talk about whether it is a good time to proceed.

## 2021-12-04 NOTE — Telephone Encounter (Signed)
Called and spoke with patient to follow up from [facility] and assess navigation needs. We also discussed her chemo. She would like her treatments to be on Wednesdays.  She was originally scheduled for port on 2/2 but prefers this be changed to 2/7 then start chemo next day. I have placed scheduling orders for chemo to start 2/8 and her port will now be on 2/7. She is requesting the radiology allergy protocol because she watched her mother die from having an MRI allergy and went into anaphylactic shock. She does not know whether or not she personally has an allergy. Informed I would call in the prednisone and will email her per her request the instructions for this. Encouraged her to call should she have any other questions or concerns.

## 2021-12-07 ENCOUNTER — Telehealth: Payer: Self-pay | Admitting: *Deleted

## 2021-12-07 NOTE — Telephone Encounter (Signed)
[facility] Nausea Study: Called patient to follow up on her interest in the [facility] Nausea Study. Patient states she has not had a chance to read the information that was given to her last week. She plans on looking at it tonight and requests research nurse call her back tomorrow. She did not have any questions at this time. Thanked patient for her time and will follow up with her tomorrow.  Foye Spurling, BSN, RN, CCRP Clinical Research Nurse II 12/07/2021 10:34 AM

## 2021-12-08 ENCOUNTER — Other Ambulatory Visit: Payer: Self-pay | Admitting: *Deleted

## 2021-12-08 ENCOUNTER — Encounter: Payer: Self-pay | Admitting: General Practice

## 2021-12-08 ENCOUNTER — Telehealth: Payer: Self-pay | Admitting: Hematology and Oncology

## 2021-12-08 ENCOUNTER — Telehealth: Payer: Self-pay | Admitting: *Deleted

## 2021-12-08 NOTE — Telephone Encounter (Signed)
This RN spoke with the pt per her questions she asked to scheduling- which they were not able to answer.  This RN reviewed how to take the prednisone for possible allergy to MRI dye (note her mother died from an allergic reaction to IV dye with an MRI)- she verified she is being driven to her MRI appt therefore she understands she can take ativan pre procedure if needed.  This RN also reviewed expectation relating to length of first [facility] which she is concerned due to " I have to pick someone up at 630 in [place] so I cannot stay post 5 pm"  This RN discussed need for a backup plan for possible transportation issue due to potential that her treatment could extend beyond 5 pm- this RN did offer to change the date of her infusion if she felt that would be more accommodating - pt declined and came up with possible back up plans if needed.  Note pt states anxiety secondary to above- this RN discussed use of medications she has in the home for benefit.  Per end of phone discussion- Julie Atkinson stated appreciation of call and assistance in answering her questions.  No further needs at this time.

## 2021-12-08 NOTE — Progress Notes (Signed)
[place] [facility] Psychosocial Distress Screening Spiritual Care  Met with Analia by phone following [facility] to introduce [place] team/resources, reviewing distress screen per protocol.  The patient scored a 10 on the Psychosocial Distress Thermometer which indicates severe distress. Also assessed for distress and other psychosocial needs.   ONCBCN DISTRESS SCREENING 12/08/2021  Screening Type Initial Screening  Distress experienced in past week (1-10) 10  Practical problem type Housing;Insurance;Work/school;Transportation;Food;Childcare  Emotional problem type Nervousness/Anxiety;Adjusting to illness;Isolation/feeling alone;Adjusting to appearance changes  Spiritual/Religous concerns type Facing my mortality  Information Concerns Type Lack of info about diagnosis;Lack of info about treatment;Lack of info about complementary therapy choices  Physical Problem type Pain;Sleep/insomnia  Referral to support programs Yes   Built rapport with Ms Antwine and provided empathic listening, pastoral reflection, normalization of feelings, emotional support, and introduction to [facility] programming resources as she shared about her difficult experience of receiving her diagnosis and her distress about treatment. She is a single mom with no family for support, works 6 hours/week, and has a 15yo son; this combination makes her worry about logistics during treatment.  Placed referral for [employer] (trained peer mentor) per her request. We plan to follow up in person at her first treatment.   Follow up needed: Yes.     [place], [place], [facility] Pager [phone] Voicemail [phone]

## 2021-12-08 NOTE — Telephone Encounter (Signed)
Sch per 1/27 inbasket, pt aware

## 2021-12-08 NOTE — Telephone Encounter (Signed)
URCC Study: Called patient to follow up on her interest in this study. Patient states has not had a chance to read the consents yet and requests call back on Thursday. Gave patient my phone number in case she has any questions before Thursday.  Foye Spurling, BSN, RN, [employer] Research Nurse II 12/08/2021 4:09 PM

## 2021-12-09 ENCOUNTER — Other Ambulatory Visit: Payer: Self-pay | Admitting: Hematology and Oncology

## 2021-12-09 ENCOUNTER — Ambulatory Visit
Admission: RE | Admit: 2021-12-09 | Discharge: 2021-12-09 | Disposition: A | Discharge status: Home / Self Care | Payer: BC Managed Care – PPO | Source: Ambulatory Visit | Attending: Hematology and Oncology | Admitting: Hematology and Oncology

## 2021-12-09 DIAGNOSIS — C50919 Malignant neoplasm of unspecified site of unspecified female breast: Secondary | ICD-10-CM | POA: Diagnosis not present

## 2021-12-09 DIAGNOSIS — C50411 Malignant neoplasm of upper-outer quadrant of right female breast: Secondary | ICD-10-CM

## 2021-12-09 DIAGNOSIS — C50912 Malignant neoplasm of unspecified site of left female breast: Secondary | ICD-10-CM | POA: Diagnosis not present

## 2021-12-09 IMAGING — MR MR BREAST BILAT WO/W CM
8 of 13 series · 31 of 48 positions shown · IV contrast (8 ml gadavist)
Comparison: Previous exams.

CLINICAL DATA: 50-year-old female with recently diagnosed invasive
mammary carcinoma with features of lobular histology post
ultrasound-guided biopsy of a palpable mass at 10 o'clock position
as well as an intraductal mass in the retroareolar right breast.

EXAM:
BILATERAL BREAST MRI WITH AND WITHOUT CONTRAST
TECHNIQUE: Multiplanar, multisequence MR images of both breasts were obtained
prior to and following the intravenous administration of 8 ml of
Gadavist

[Series 2: t2_tirm_tra ipat (a-p) · axial · 3.0mm · 0.70mm/px · 1 of 55 slices shown]
[im 1/55]
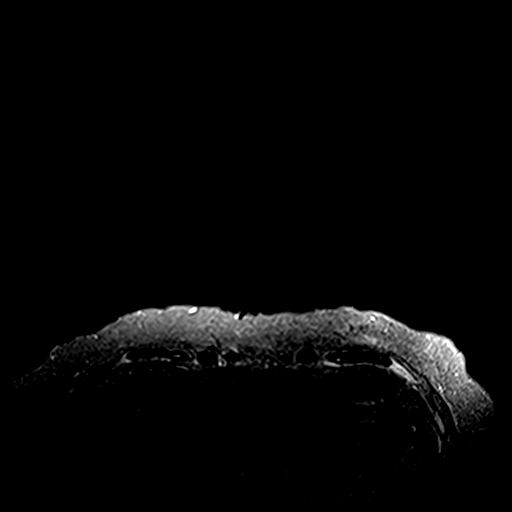

[Series 3: fl3d pre-cm no · axial · non-contrast · 1.2mm · 0.89mm/px · z∈[-89,+83]mm · 5 of 144 slices shown]
[im 1/144]
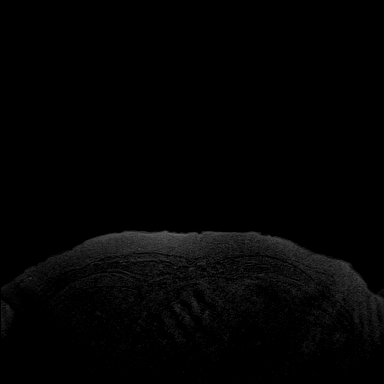
[im 36/144]
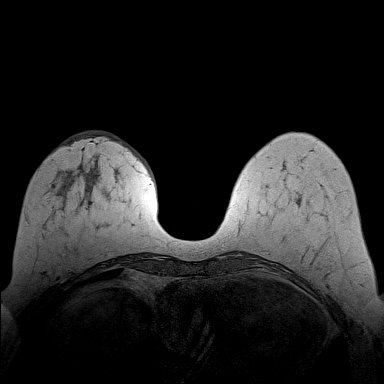
[im 72/144]
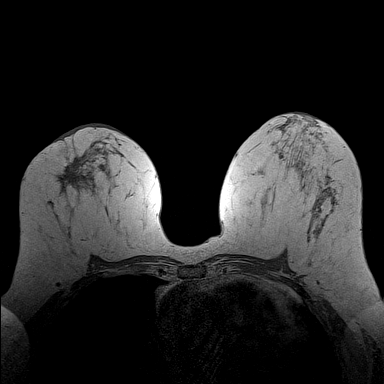
[im 108/144]
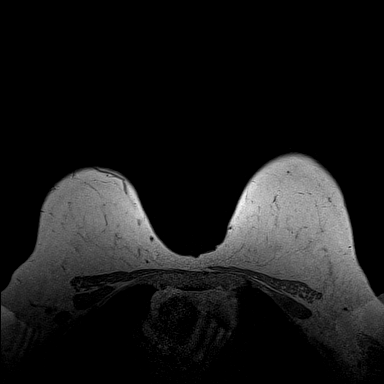
[im 144/144]
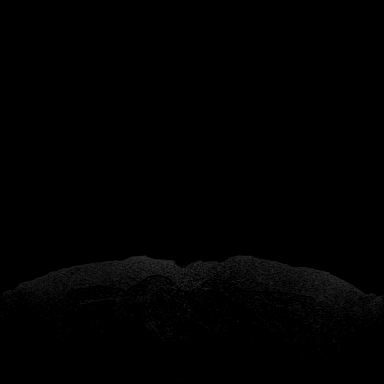

[Series 4: fl3d pre-cm · axial · non-contrast · 1.2mm · 0.89mm/px · z∈[-89,+83]mm · 5 of 144 slices shown]
[im 1/144]
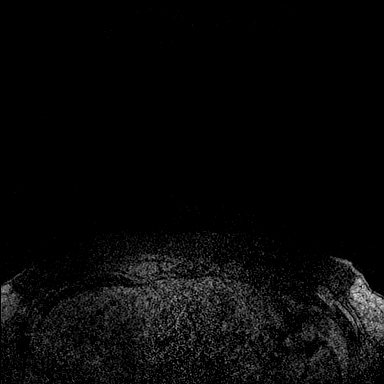
[im 36/144]
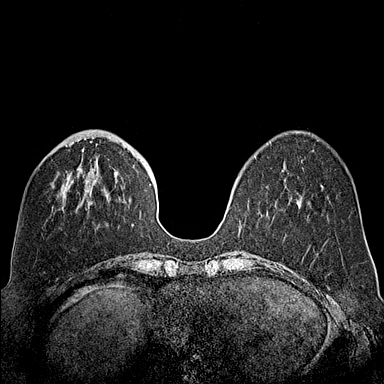
[im 72/144]
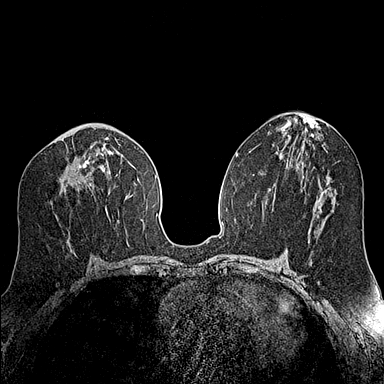
[im 108/144]
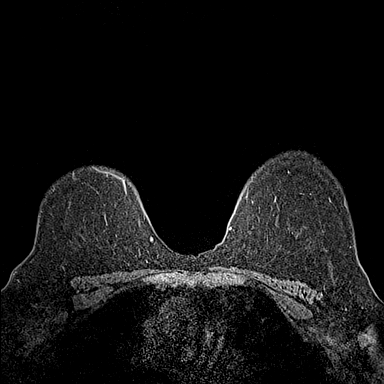
[im 144/144]
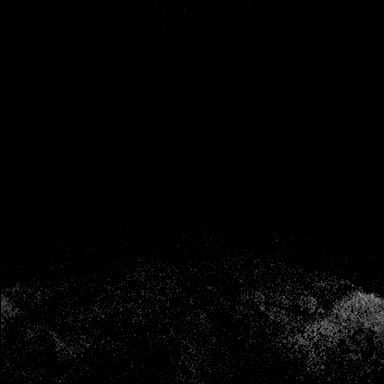

[Series 5: fl3d post-cm 20 · axial · 1.2mm · 0.89mm/px · z∈[-89,+83]mm · 5 of 144 slices shown (1 of 3)]
[im 1/144]
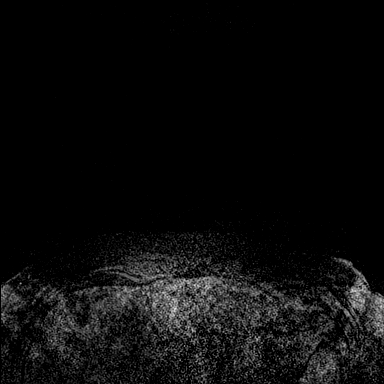
[im 36/144]
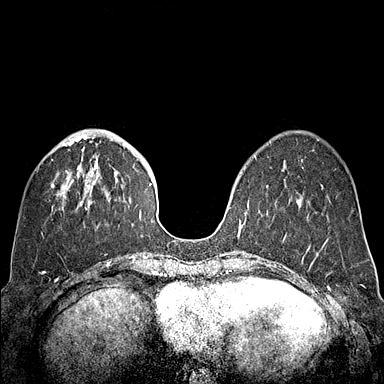
[im 72/144]
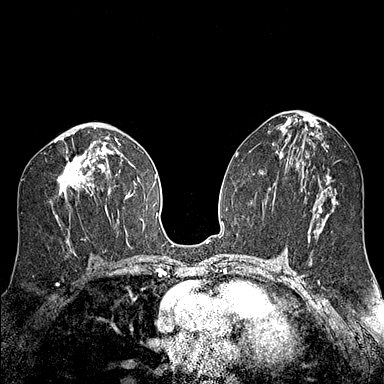
[im 108/144]
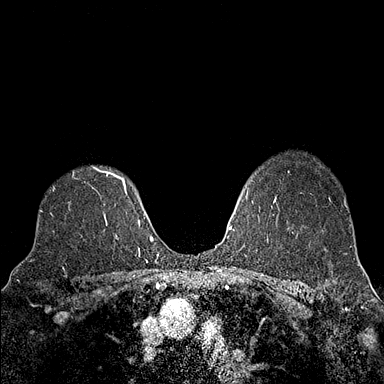
[im 144/144]
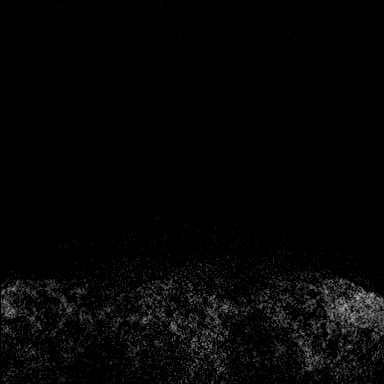

[Series 6: fl3d post-cm 20 · axial · 1.2mm · 0.89mm/px · z∈[-89,+83]mm · 5 of 144 slices shown (2 of 3)]
[im 1/144]
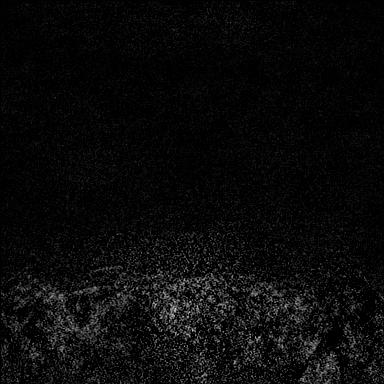
[im 36/144]
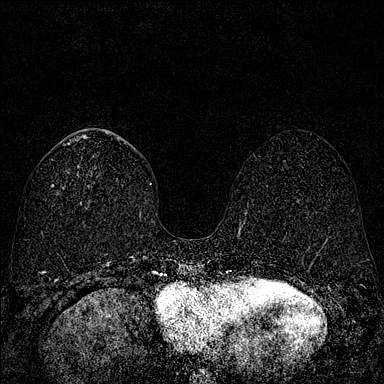
[im 72/144]
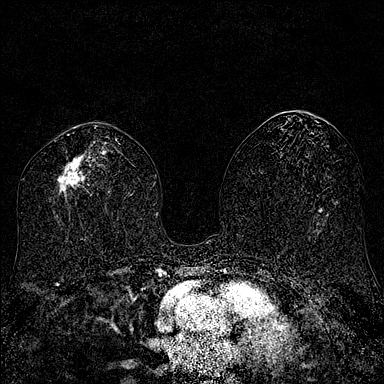
[im 108/144]
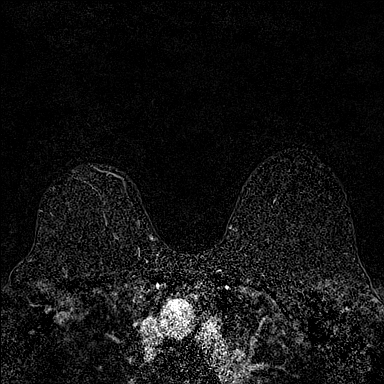
[im 144/144]
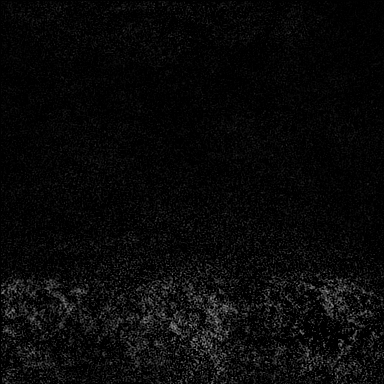

[Series 7: fl3d post-cm 20 · axial · 172.8mm · 0.89mm/px · 1 of 1 slices shown (3 of 3)]
[im 1/1]
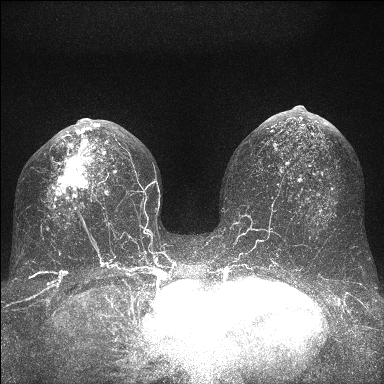

[Series 8: fl3d post-cm 3 · axial · 1.2mm · 0.89mm/px · z∈[-89,+83]mm · 5 of 144 slices shown (1 of 2)]
[im 1/144]
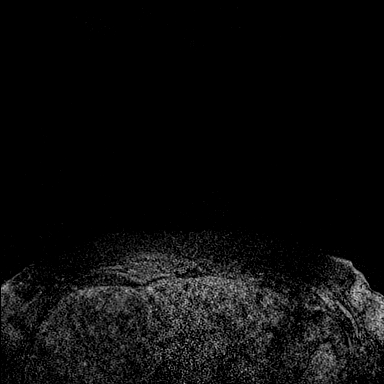
[im 36/144]
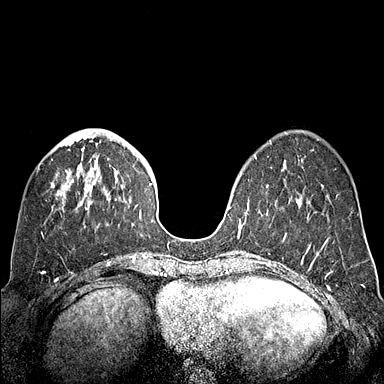
[im 72/144]
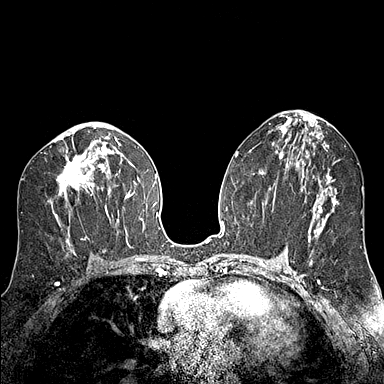
[im 108/144]
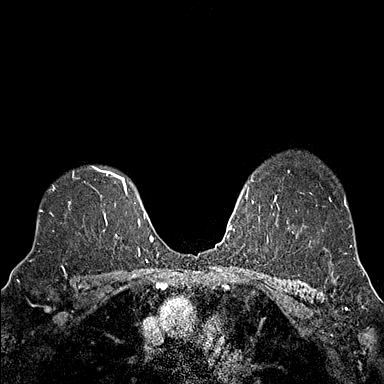
[im 144/144]
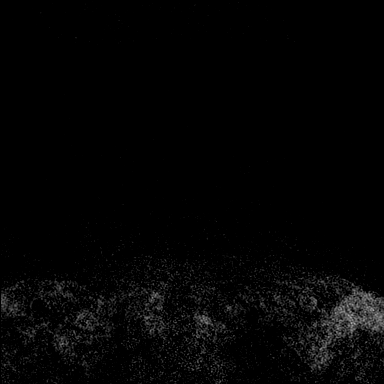

[Series 9: fl3d post-cm 3 · axial · 1.2mm · 0.89mm/px · z∈[-89,+13]mm · 4 of 144 slices shown (2 of 2)]
[im 1/144]
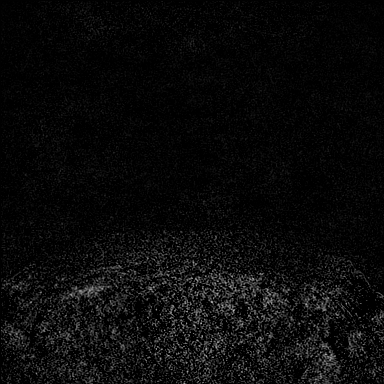
[im 29/144]
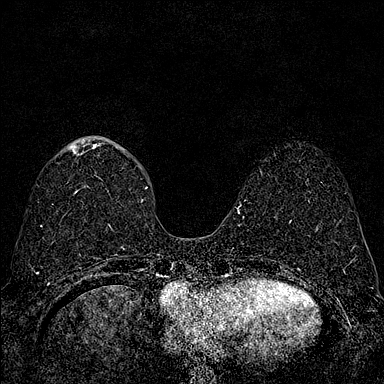
[im 58/144]
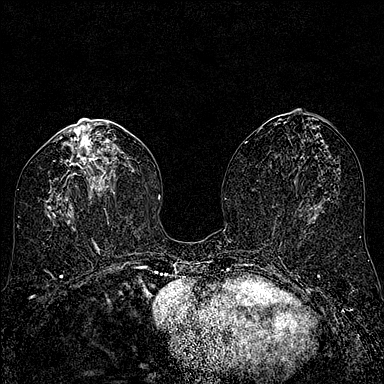
[im 86/144]
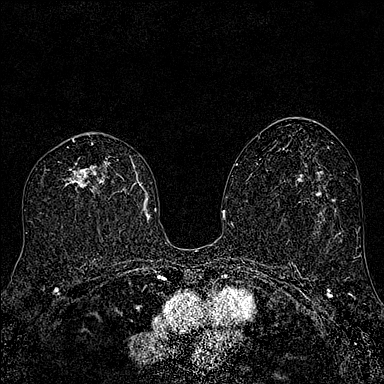

[31 of 48 positions shown; findings below may reference images not displayed]

Three-dimensional MR images were rendered by post-processing of the
original MR data on an independent workstation. The
three-dimensional MR images were interpreted, and findings are
reported in the following complete MRI report for this study. Three
dimensional images were evaluated at the independent interpreting
workstation using the DynaCAD thin client.
FINDINGS: Breast composition: c.  Heterogeneous fibroglandular tissue.

Background parenchymal enhancement: Moderate.

Right breast: Large mass involving the retroareolar and upper outer
right breast all together measures approximately 6.7 cm AP, 4.9 cm
transverse and 2.6 cm craniocaudal. Biopsy marking clip artifact is
present at the sites of recent biopsy in the retroareolar right
breast and upper outer right breast. This mass is infiltrative in
appearance with no well definable boundaries, extending to the level
of and likely involves the nipple areola complex. There is no
involvement of the pectoralis muscle.

Left breast: Several small scattered enhancing foci are identified
within the left breast. There is a 0.6 cm enhancing mass with margin
irregularity in the lower inner left breast (image 92). There is an
additional 0.6 cm enhancing mass in the upper central left breast
(image 60).

Lymph nodes: No abnormal appearing lymph nodes.

Ancillary findings: There is a suspicious 2.1 cm lymph node in the
right axilla with a thickened cortex seen on image 30.
IMPRESSION: 1. Biopsy-proven infiltrative malignancy involving the retroareolar
and upper outer right breast measures 6.7 x 4.9 x 2.6 cm. This
extends to the level of and likely involves the right nipple areola
complex.

2. Suspicious morphologically 2.1 cm lymph node in the right axilla.

3. Several scattered enhancing foci in the left breast, some of
which are slightly larger/irregular in appearance, with an
indeterminate enhancing 0.6 cm mass in the lower inner (image 92)
left breast and an indeterminate enhancing 0.6 cm mass in the upper
central left breast (image 60).

RECOMMENDATION:
1. Recommend the patient return for a second-look ultrasound and
possible biopsy of the abnormal right axillary lymph node.

2. Recommend MRI guided biopsy of the 0.6 cm enhancing masses in the
lower inner and upper central left breast.

BI-RADS CATEGORY  4: Suspicious.

## 2021-12-09 MED ORDER — GADOBUTROL 1 MMOL/ML IV SOLN
8.0000 mL | Freq: Once | INTRAVENOUS | Status: AC | PRN
  Administered 2021-12-09: 8 mL via INTRAVENOUS

## 2021-12-09 NOTE — Progress Notes (Signed)
Pharmacist Chemotherapy Monitoring - Initial Assessment    Anticipated start date: 12/16/21   The following has been reviewed per standard work regarding the patient's treatment regimen: The patient's diagnosis, treatment plan and drug doses, and organ/hematologic function Lab orders and baseline tests specific to treatment regimen  The treatment plan start date, drug sequencing, and pre-medications Prior authorization status  Patient's documented medication list, including drug-drug interaction screen and prescriptions for anti-emetics and supportive care specific to the treatment regimen The drug concentrations, fluid compatibility, administration routes, and timing of the medications to be used The patient's access for treatment and lifetime cumulative dose history, if applicable  The patient's medication allergies and previous infusion related reactions, if applicable   Changes made to treatment plan:  N/A  Follow up needed:  Pending authorization for treatment  ECHO scheduled 2/3 and port 12/15/21   Philomena Course, RPH, 12/09/2021  1:29 PM

## 2021-12-10 ENCOUNTER — Telehealth: Payer: Self-pay | Admitting: *Deleted

## 2021-12-10 ENCOUNTER — Encounter: Payer: Self-pay | Admitting: *Deleted

## 2021-12-10 ENCOUNTER — Other Ambulatory Visit: Payer: Self-pay | Admitting: *Deleted

## 2021-12-10 ENCOUNTER — Other Ambulatory Visit: Payer: Self-pay | Admitting: Hematology and Oncology

## 2021-12-10 DIAGNOSIS — R928 Other abnormal and inconclusive findings on diagnostic imaging of breast: Secondary | ICD-10-CM

## 2021-12-10 NOTE — Telephone Encounter (Signed)
Spoke to pt regarding MR results and recommendations. Pt request appts to be done asap and between the hours of 10-3 per pt request. Pt dissatisfied that she needs further bx on the right and left breast and requests the be done prior to chemo starting. Informed pt that as long as bx are done within a few days after chemo that there will not be change in the tumor if it should turn out to be cancerous. Also discussed with pt that day 3-5 pt will have "down days" from chemotherapy and the first couple of days after chemo she should in general feel well.  Relayed information to [facility] and GI. Pt scheduled for US/bx of right axillary LN on 2/13 per pt Was notified by GI, pt will call her physician team to further discuss MRI bx.  Physician team notified.  Pt did inform she has decided not to move forward with 2nd opinion at [facility].

## 2021-12-10 NOTE — Telephone Encounter (Signed)
[facility] Nausea Study: LVM for patient requesting call back to research nurse to follow up on the study.  Foye Spurling, BSN, RN, [place] Nurse II 12/10/2021 1:42 PM

## 2021-12-10 NOTE — Telephone Encounter (Signed)
Called pt to discuss MRI results and recommendations. Left vm requesting return call.

## 2021-12-11 ENCOUNTER — Inpatient Hospital Stay: Payer: BC Managed Care – PPO | Attending: Hematology and Oncology

## 2021-12-11 ENCOUNTER — Telehealth: Payer: Self-pay

## 2021-12-11 ENCOUNTER — Ambulatory Visit (HOSPITAL_COMMUNITY)
Admission: RE | Admit: 2021-12-11 | Discharge: 2021-12-11 | Disposition: A | Discharge status: Home / Self Care | Payer: BC Managed Care – PPO | Source: Ambulatory Visit | Attending: Hematology and Oncology | Admitting: Hematology and Oncology

## 2021-12-11 ENCOUNTER — Other Ambulatory Visit: Payer: Self-pay | Admitting: Hematology and Oncology

## 2021-12-11 ENCOUNTER — Other Ambulatory Visit: Payer: Self-pay

## 2021-12-11 DIAGNOSIS — C50411 Malignant neoplasm of upper-outer quadrant of right female breast: Secondary | ICD-10-CM | POA: Insufficient documentation

## 2021-12-11 DIAGNOSIS — Z0189 Encounter for other specified special examinations: Secondary | ICD-10-CM

## 2021-12-11 DIAGNOSIS — Z5112 Encounter for antineoplastic immunotherapy: Secondary | ICD-10-CM | POA: Insufficient documentation

## 2021-12-11 DIAGNOSIS — Z5111 Encounter for antineoplastic chemotherapy: Secondary | ICD-10-CM | POA: Insufficient documentation

## 2021-12-11 DIAGNOSIS — Z5189 Encounter for other specified aftercare: Secondary | ICD-10-CM | POA: Insufficient documentation

## 2021-12-11 DIAGNOSIS — Z79899 Other long term (current) drug therapy: Secondary | ICD-10-CM | POA: Insufficient documentation

## 2021-12-11 DIAGNOSIS — I447 Left bundle-branch block, unspecified: Secondary | ICD-10-CM | POA: Insufficient documentation

## 2021-12-11 DIAGNOSIS — Z170 Estrogen receptor positive status [ER+]: Secondary | ICD-10-CM

## 2021-12-11 DIAGNOSIS — R928 Other abnormal and inconclusive findings on diagnostic imaging of breast: Secondary | ICD-10-CM

## 2021-12-11 DIAGNOSIS — Z01818 Encounter for other preprocedural examination: Secondary | ICD-10-CM | POA: Diagnosis not present

## 2021-12-11 DIAGNOSIS — R197 Diarrhea, unspecified: Secondary | ICD-10-CM | POA: Insufficient documentation

## 2021-12-11 LAB — ECHOCARDIOGRAM COMPLETE
Area-P 1/2: 3.12 cm2
Calc EF: 65.8 %
S' Lateral: 2.9 cm
Single Plane A2C EF: 64.5 %
Single Plane A4C EF: 67.1 %

## 2021-12-11 NOTE — Progress Notes (Signed)
°  Echocardiogram 2D Echocardiogram has been performed.  Julie Atkinson 12/11/2021, 11:40 AM

## 2021-12-11 NOTE — Telephone Encounter (Signed)
[id number] - TREATMENT OF REFRACTORY NAUSEA  DCP-001: Use of a Clinical Trial Screening Tool to Address Cancer Health Disparities in the [facility] [place])  Spoke with patient after she completed chemo ed. She is feeling very overwhelmed by her diagnosis, disease, and treatment plan, as well as how many appointments she has to already attend. At this time, she is not interested in study participation. Confirmed that she has the team number in case she has questions or we can be of assistance in the future. Thanked her for her time and willingness to speak to research.  Marjie Skiff Shawan Corella, RN, BSN, [facility]   Her   [place] Clinical Research Nurse [employer] Direct Dial [phone]   Pager [phone] 12/11/2021 2:08 PM

## 2021-12-13 ENCOUNTER — Other Ambulatory Visit: Payer: Self-pay | Admitting: Hematology and Oncology

## 2021-12-13 DIAGNOSIS — C50411 Malignant neoplasm of upper-outer quadrant of right female breast: Secondary | ICD-10-CM

## 2021-12-14 ENCOUNTER — Ambulatory Visit
Admission: RE | Admit: 2021-12-14 | Discharge: 2021-12-14 | Disposition: A | Discharge status: Home / Self Care | Payer: BC Managed Care – PPO | Source: Ambulatory Visit | Attending: Hematology and Oncology | Admitting: Hematology and Oncology

## 2021-12-14 ENCOUNTER — Encounter: Payer: Self-pay | Admitting: Hematology and Oncology

## 2021-12-14 DIAGNOSIS — R928 Other abnormal and inconclusive findings on diagnostic imaging of breast: Secondary | ICD-10-CM

## 2021-12-14 DIAGNOSIS — N6012 Diffuse cystic mastopathy of left breast: Secondary | ICD-10-CM | POA: Diagnosis not present

## 2021-12-14 DIAGNOSIS — N6321 Unspecified lump in the left breast, upper outer quadrant: Secondary | ICD-10-CM | POA: Diagnosis not present

## 2021-12-14 DIAGNOSIS — N6324 Unspecified lump in the left breast, lower inner quadrant: Secondary | ICD-10-CM | POA: Diagnosis not present

## 2021-12-14 IMAGING — MG MM BREAST LOCALIZATION CLIP
4 series · 4 of 12 positions shown · non-contrast
Comparison: Previous exam(s).

CLINICAL DATA: Evaluate placement of CYLINDER biopsy clip and
BARBELL biopsy clip following MR guided LEFT breast biopsies.

EXAM:
3D DIAGNOSTIC LEFT MAMMOGRAM POST MRI BIOPSY

[L CC synth-2D]
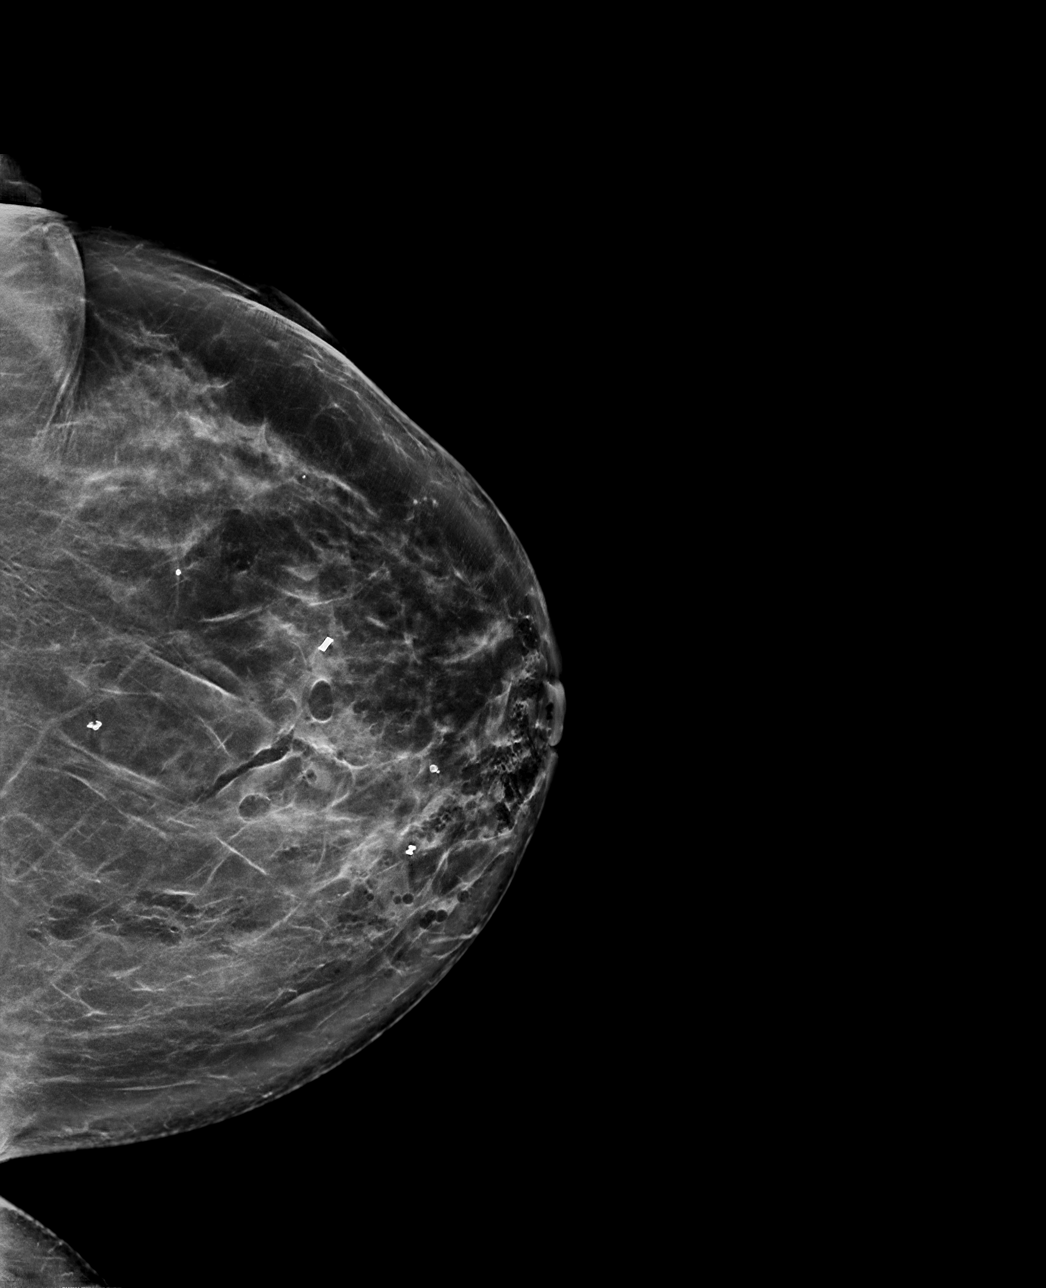

[L ML synth-2D]
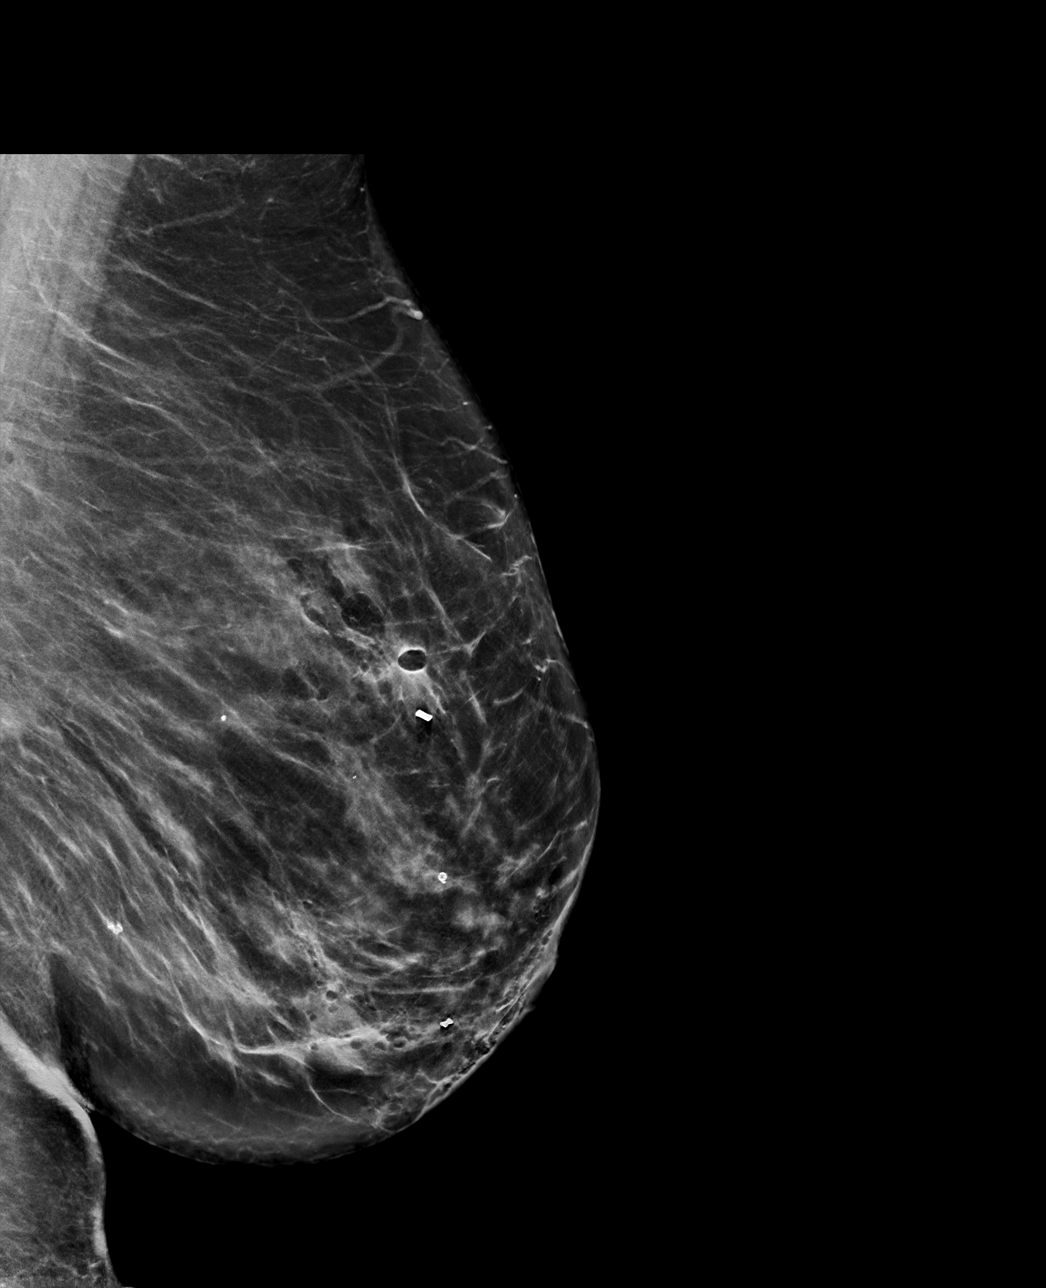

[L ML tomo · tomo slice 47/94.0]
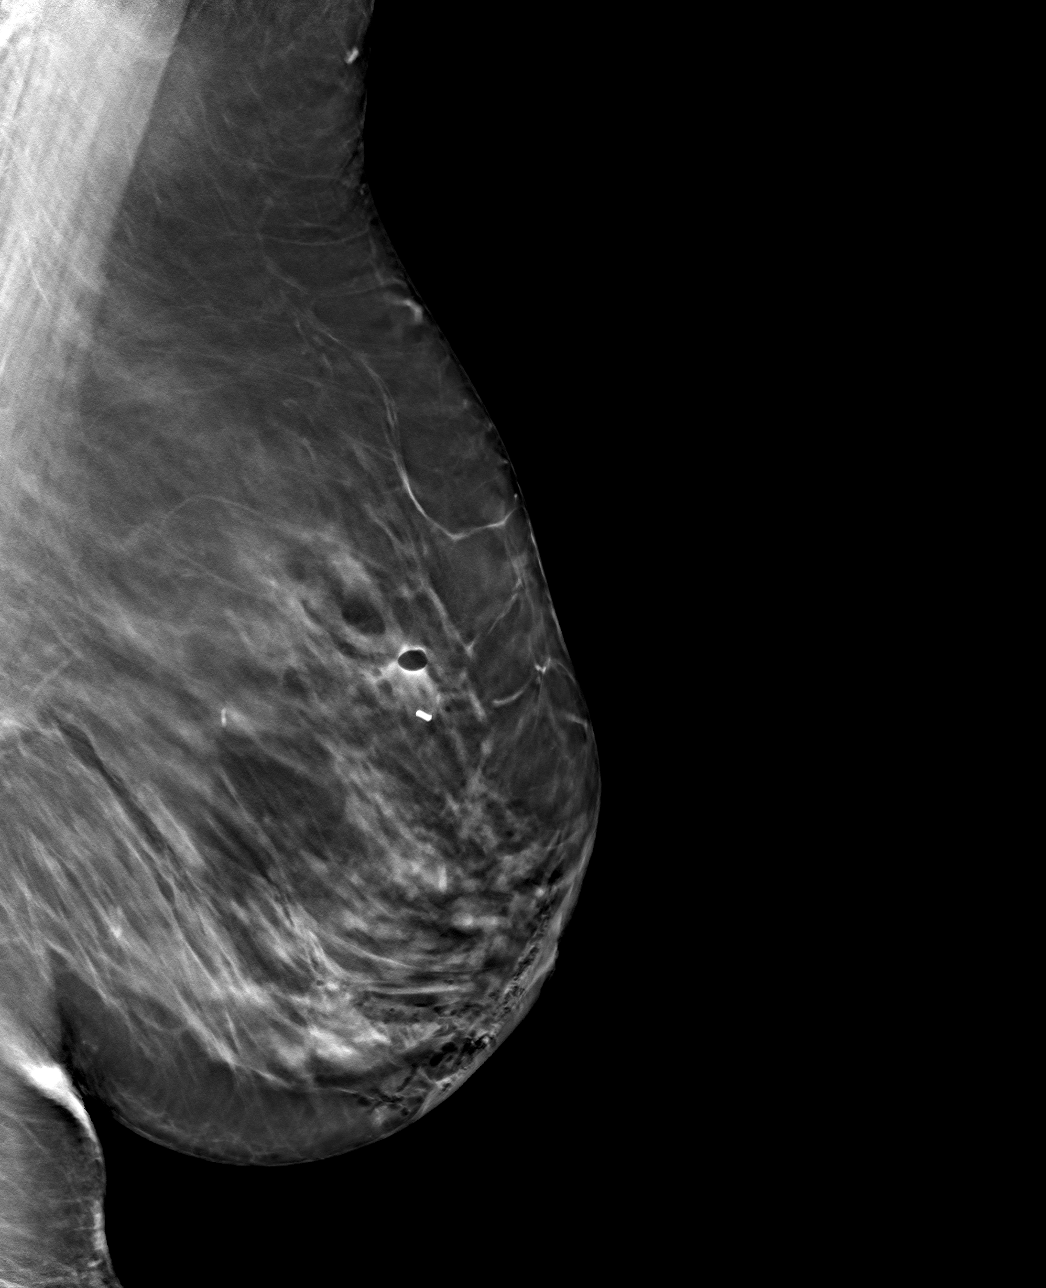

[L CC tomo · tomo slice 50/99.0]
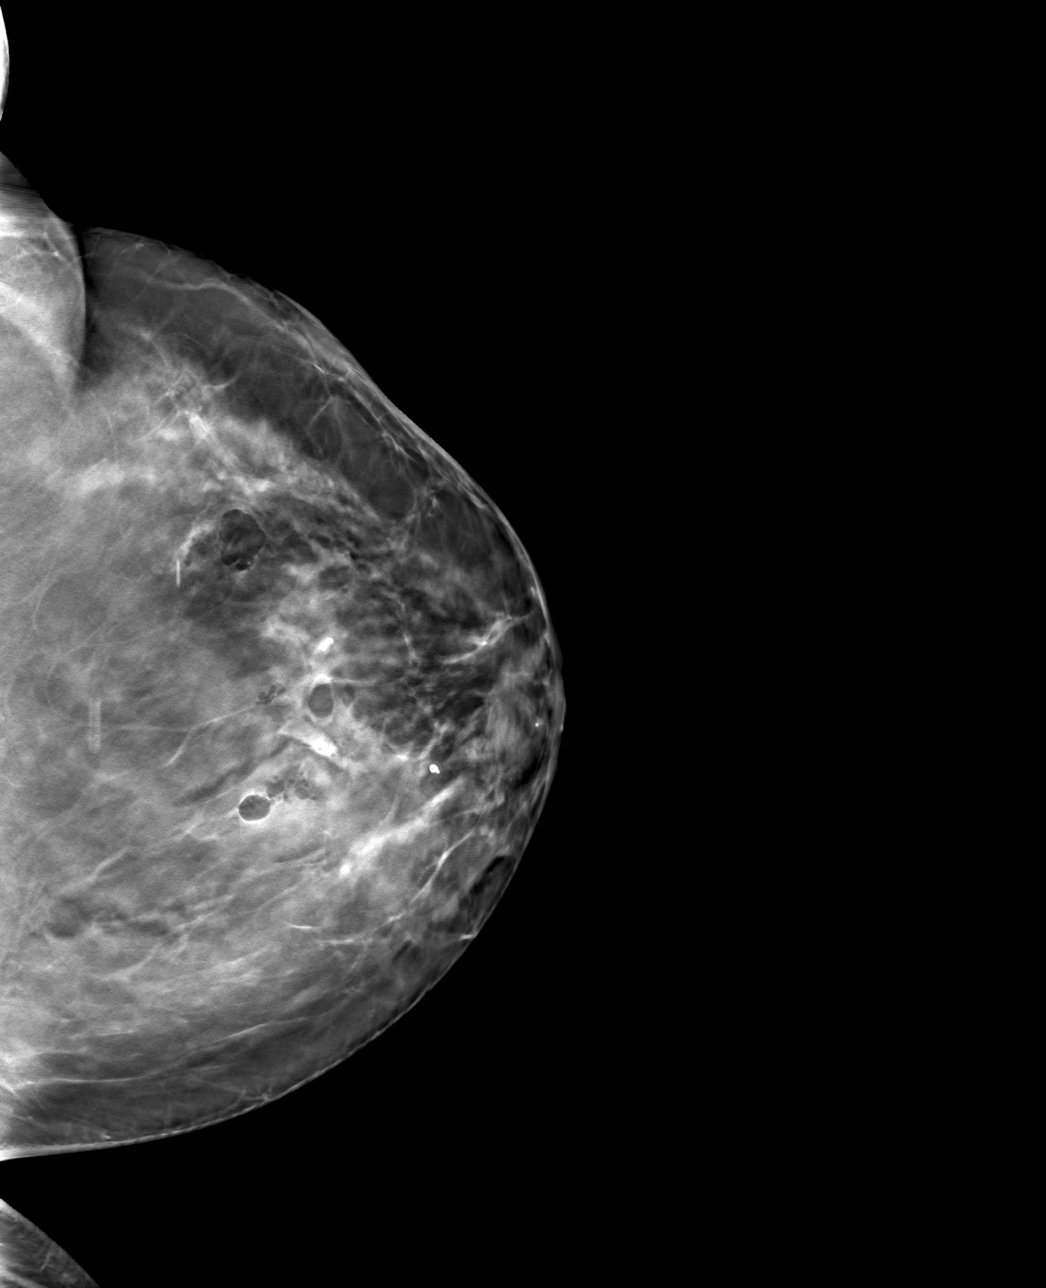

[4 of 12 positions shown; findings below may reference images not displayed]

FINDINGS: 3D Mammographic images were obtained following MR guided biopsies of
a 0.6 cm UPPER central LEFT breast mass (CYLINDER clip) and a 0.6 cm
LOWER INNER LEFT breast mass (BARBELL clip).

The CYLINDER biopsy marking clip is in expected position at the site
of biopsy in the UPPER central LEFT breast.

The BARBELL biopsy marking clip is in expected position at the site
of biopsy in the LOWER INNER LEFT breast.

The CYLINDER and BARBELL clips are separated by a distance of 6 cm.
IMPRESSION: Appropriate positioning of the CYLINDER shaped biopsy marking clip
at the site of biopsy in the UPPER central LEFT breast.

Appropriate positioning of the BARBELL shaped biopsy marking clip at
the site of biopsy in the LOWER INNER LEFT breast.

Final Assessment: Post Procedure Mammograms for Marker Placement

## 2021-12-14 IMAGING — MR MR BREAST BX W LOC DEV 1ST LESION IMAGE BX SPEC MR GUIDE*L*
6 of 10 series · 27 of 48 positions shown · IV contrast (9 ml gadavist)
Comparison: Previous exams.
COMPARISON: Previous exams.
COMPARISON: Previous exams.

Addendum:
CLINICAL DATA: 50-year-old female for tissue sampling of 2 separate
0.6 cm LEFT breast masses. Recent diagnosis of RIGHT breast cancer.

EXAM:
MRI GUIDED CORE NEEDLE BIOPSY OF THE LEFT BREAST X 2
TECHNIQUE: Multiplanar, multisequence MR imaging of the LEFT breast was
performed both before and after administration of intravenous
contrast.
CONTRAST:  9mL GADAVIST GADOBUTROL 1 MMOL/ML IV SOLN

[Series 2: fiducial unilateral · sagittal · 2.0mm · 1.33mm/px · 3 of 52 slices shown]
[im 1/52]
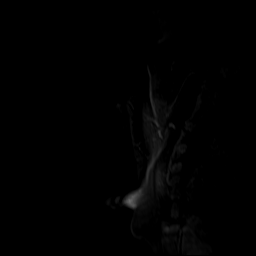
[im 26/52]
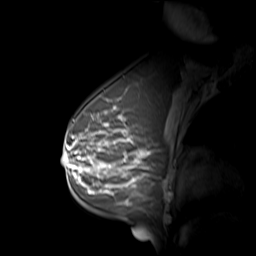
[im 52/52]
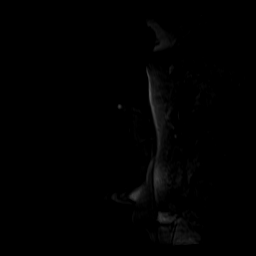

[Series 3: dynamic pre · axial · non-contrast · 1.3mm · 0.73mm/px · z∈[-110,+76]mm · 5 of 144 slices shown]
[im 1/144]
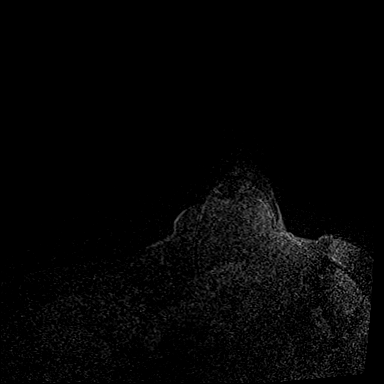
[im 36/144]
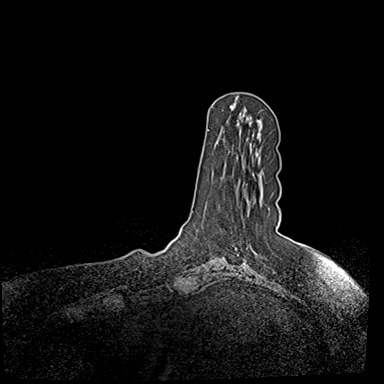
[im 72/144]
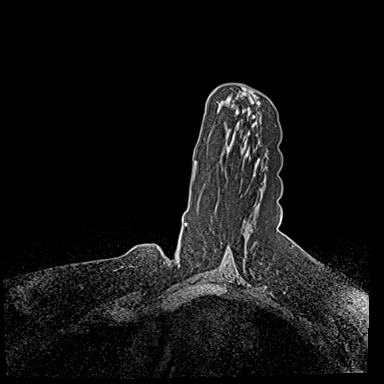
[im 108/144]
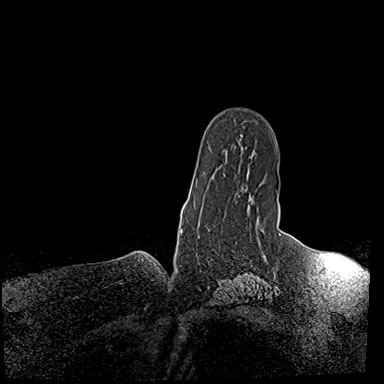
[im 144/144]
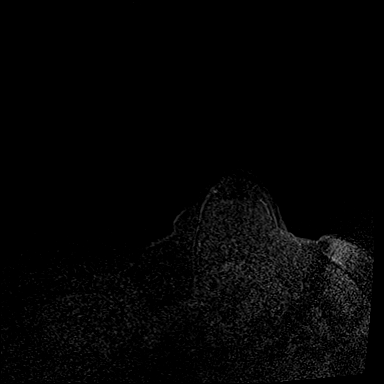

[Series 4: dynamic post 20 · axial · 1.3mm · 0.73mm/px · z∈[-110,+76]mm · 5 of 144 slices shown (1 of 2)]
[im 1/144]
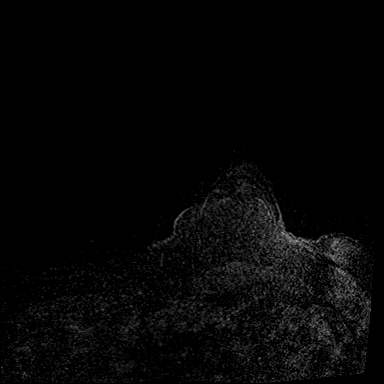
[im 36/144]
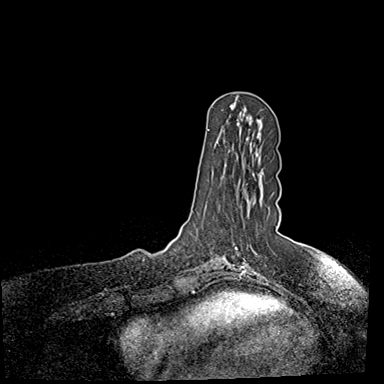
[im 72/144]
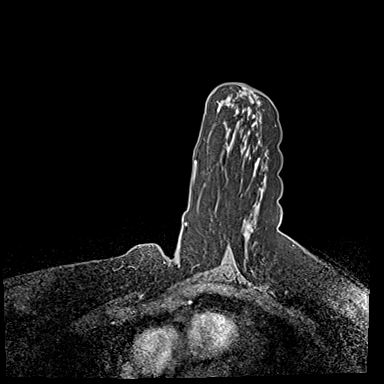
[im 108/144]
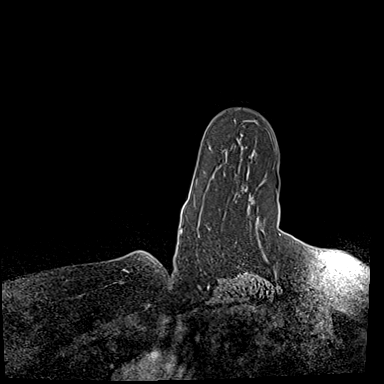
[im 144/144]
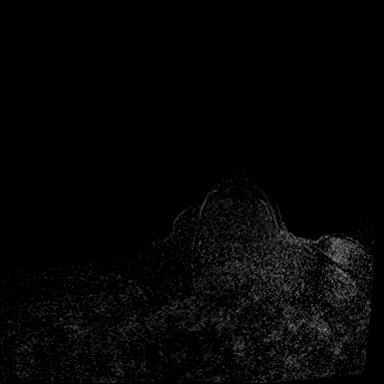

[Series 5: dynamic post 20 · axial · 1.3mm · 0.73mm/px · z∈[-110,+76]mm · 5 of 144 slices shown (2 of 2)]
[im 1/144]
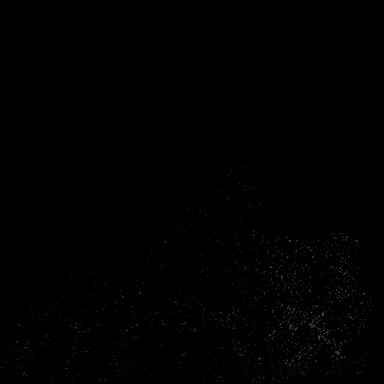
[im 36/144]
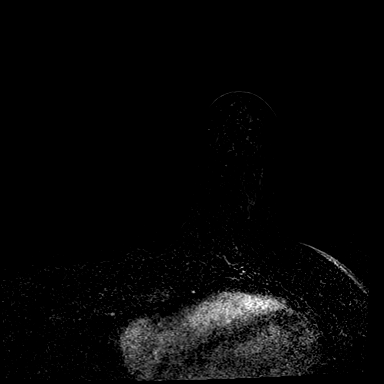
[im 72/144]
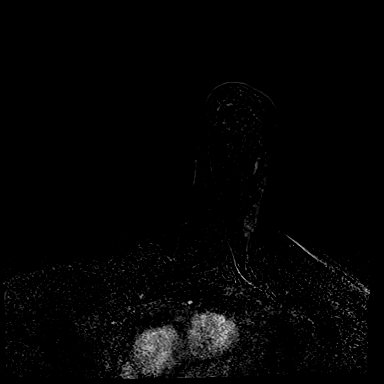
[im 108/144]
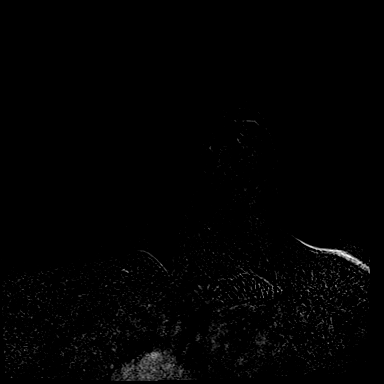
[im 144/144]
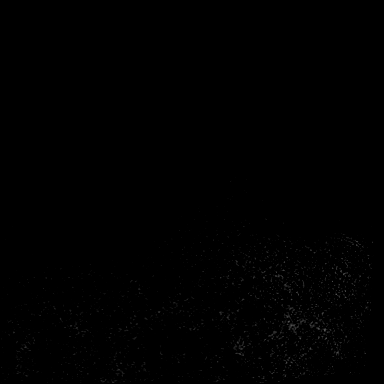

[Series 6: dynamic post 3 · axial · 1.3mm · 0.73mm/px · z∈[-110,+76]mm · 5 of 144 slices shown (1 of 2)]
[im 1/144]
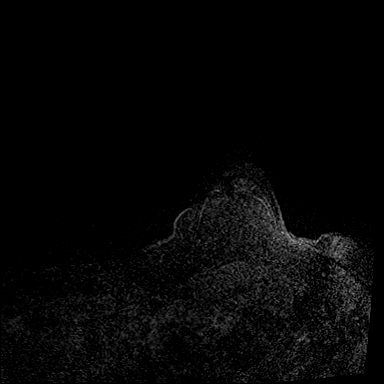
[im 36/144]
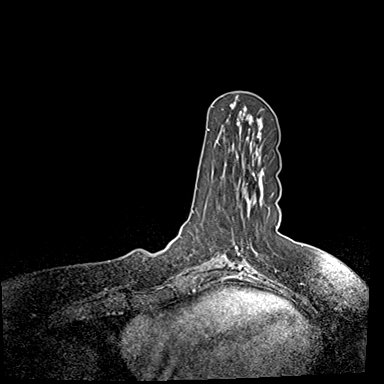
[im 72/144]
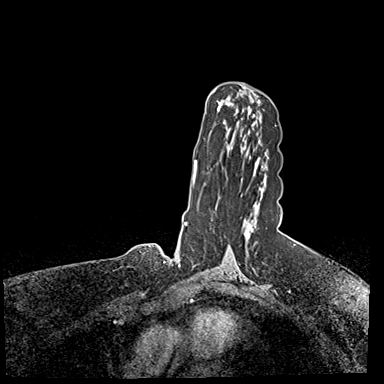
[im 108/144]
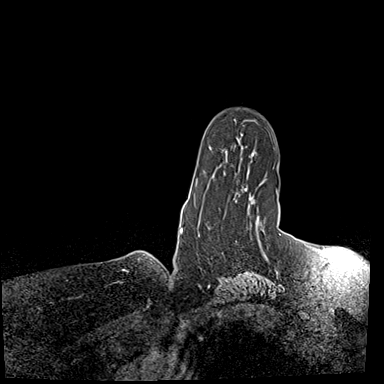
[im 144/144]
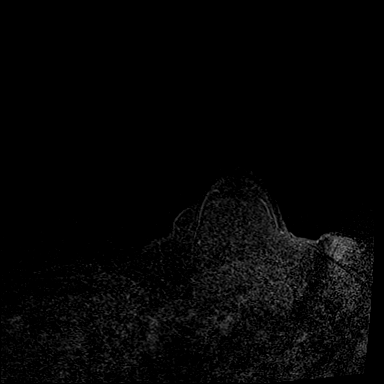

[Series 7: dynamic post 3 · axial · 1.3mm · 0.73mm/px · z∈[-110,+29]mm · 4 of 144 slices shown (2 of 2)]
[im 1/144]
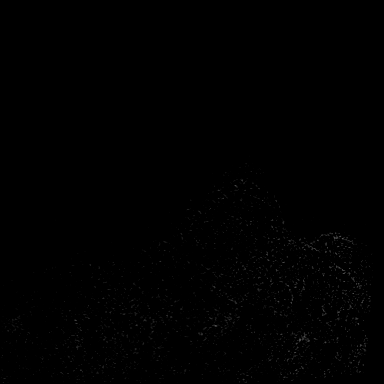
[im 36/144]
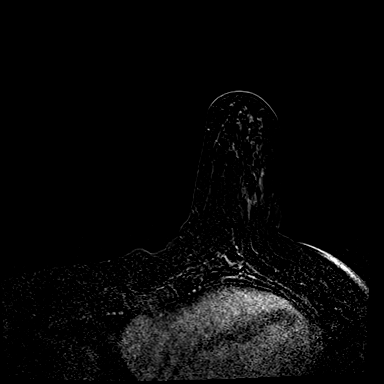
[im 72/144]
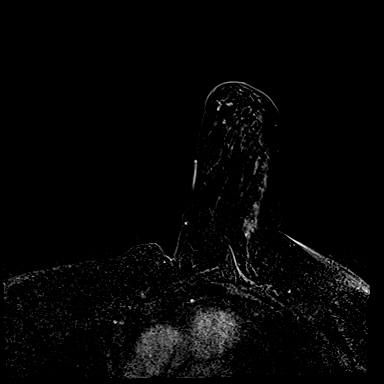
[im 108/144]
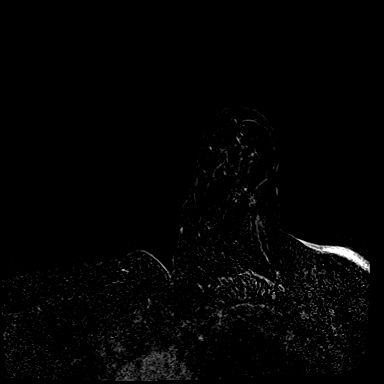

[27 of 48 positions shown; findings below may reference images not displayed]

FINDINGS: I met with the patient, and we discussed the procedure of MRI guided
biopsy, including risks, benefits, and alternatives. Specifically,
we discussed the risks of infection, bleeding, tissue injury, clip
migration, and inadequate sampling. Informed, written consent was
given. The usual time out protocol was performed immediately prior
to the procedure.

MRI GUIDED CORE NEEDLE BIOPSY OF THE LEFT BREAST #1 (0.6 cm UPPER
central LEFT breast mass-CYLINDER clip):

Using sterile technique, 1% Lidocaine with and without epinephrine,
MRI guidance, and a 9 gauge vacuum assisted device, biopsy was
performed of the 0.6 cm UPPER central LEFT breast mass using a
LATERAL approach. At the conclusion of the procedure, a CYLINDER
tissue marker clip was deployed into the biopsy cavity. Follow-up
2-view mammogram was performed and dictated separately.

MRI GUIDED CORE NEEDLE BIOPSY OF THE LEFT BREAST #2 (0.6 cm LOWER
INNER LEFT breast mass-BARBELL clip):

Using sterile technique, 1% Lidocaine with and without epinephrine,
MRI guidance, and a 9 gauge vacuum assisted device, biopsy was
performed of the 0.6 cm LOWER INNER LEFT breast mass using a LATERAL
approach. At the conclusion of the procedure, a BARBELL tissue
marker clip was deployed into the biopsy cavity. Follow-up 2-view
mammogram was performed and dictated separately.
IMPRESSION: MRI guided biopsies of a 0.6 cm UPPER central LEFT breast mass
(CYLINDER clip) and a 0.6 cm LOWER INNER LEFT breast mass (BARBELL
clip). No apparent complications.

ADDENDUM:
The patient returned at [DATE] p.m. to the [REDACTED] due to
continued oozing at the LOWER LEFT breast biopsy site.

Pressure was applied with cessation of the bleeding at the LOWER
LEFT breast biopsy site. A Quik clot and pressure bandage was
applied subsequently. The patient was instructed to contact me if
any further issues arose with her biopsy site.

ADDENDUM:
Pathology revealed FIBROCYSTIC CHANGE WITH USUAL DUCTAL HYPERPLASIA
AND APOCRINE METAPLASIA of the LEFT breast, upper central, (cylinder
clip). This was found to be concordant by Dr. MEMO.

Pathology revealed FIBROCYSTIC CHANGE WITH ADENOSIS AND USUAL DUCTAL
HYPERPLASIA, SMALL COMPLEX SCLEROSING LESION (2 MM) of the LEFT
breast, lower inner, (barbell clip). This was found to be concordant
by Dr. MEMO, with consideration of excision at the time of RIGHT
breast surgery.

Pathology results were discussed with the patient by telephone. The
patient reported doing well after the biopsies with bleeding,
bruising and tenderness at the sites. NO additional bleeding noted
after Quik clot and pressure bandage was applied at The [REDACTED] on [DATE]. The pressure bandage was removed by Dr.
MEMO, with [HOSPITAL] Anesthesia Department, prior to
surgery on [DATE] with no additional bleeding noted, per
patient Post biopsy instructions and care were reviewed and
questions were answered. The patient was encouraged to call The
direct phone number was provided.

The patient has a recent diagnosis of RIGHT breast cancer and should
follow her outlined treatment plan.

Dr. MEMO was notified of biopsy results via [REDACTED] message on
[DATE].

Pathology results reported by MEMO, RN on [DATE].

*** End of Addendum ***
Addendum:
FINDINGS: I met with the patient, and we discussed the procedure of MRI guided
biopsy, including risks, benefits, and alternatives. Specifically,
we discussed the risks of infection, bleeding, tissue injury, clip
migration, and inadequate sampling. Informed, written consent was
given. The usual time out protocol was performed immediately prior
to the procedure.

MRI GUIDED CORE NEEDLE BIOPSY OF THE LEFT BREAST #1 (0.6 cm UPPER
central LEFT breast mass-CYLINDER clip):

Using sterile technique, 1% Lidocaine with and without epinephrine,
MRI guidance, and a 9 gauge vacuum assisted device, biopsy was
performed of the 0.6 cm UPPER central LEFT breast mass using a
LATERAL approach. At the conclusion of the procedure, a CYLINDER
tissue marker clip was deployed into the biopsy cavity. Follow-up
2-view mammogram was performed and dictated separately.

MRI GUIDED CORE NEEDLE BIOPSY OF THE LEFT BREAST #2 (0.6 cm LOWER
INNER LEFT breast mass-BARBELL clip):

Using sterile technique, 1% Lidocaine with and without epinephrine,
MRI guidance, and a 9 gauge vacuum assisted device, biopsy was
performed of the 0.6 cm LOWER INNER LEFT breast mass using a LATERAL
approach. At the conclusion of the procedure, a BARBELL tissue
marker clip was deployed into the biopsy cavity. Follow-up 2-view
mammogram was performed and dictated separately.
IMPRESSION: MRI guided biopsies of a 0.6 cm UPPER central LEFT breast mass
(CYLINDER clip) and a 0.6 cm LOWER INNER LEFT breast mass (BARBELL
clip). No apparent complications.

ADDENDUM:
The patient returned at [DATE] p.m. to the [REDACTED] due to
continued oozing at the LOWER LEFT breast biopsy site.

Pressure was applied with cessation of the bleeding at the LOWER
LEFT breast biopsy site. A Quik clot and pressure bandage was
applied subsequently. The patient was instructed to contact me if
any further issues arose with her biopsy site.

*** End of Addendum ***
FINDINGS: I met with the patient, and we discussed the procedure of MRI guided
biopsy, including risks, benefits, and alternatives. Specifically,
we discussed the risks of infection, bleeding, tissue injury, clip
migration, and inadequate sampling. Informed, written consent was
given. The usual time out protocol was performed immediately prior
to the procedure.

MRI GUIDED CORE NEEDLE BIOPSY OF THE LEFT BREAST #1 (0.6 cm UPPER
central LEFT breast mass-CYLINDER clip):

Using sterile technique, 1% Lidocaine with and without epinephrine,
MRI guidance, and a 9 gauge vacuum assisted device, biopsy was
performed of the 0.6 cm UPPER central LEFT breast mass using a
LATERAL approach. At the conclusion of the procedure, a CYLINDER
tissue marker clip was deployed into the biopsy cavity. Follow-up
2-view mammogram was performed and dictated separately.

MRI GUIDED CORE NEEDLE BIOPSY OF THE LEFT BREAST #2 (0.6 cm LOWER
INNER LEFT breast mass-BARBELL clip):

Using sterile technique, 1% Lidocaine with and without epinephrine,
MRI guidance, and a 9 gauge vacuum assisted device, biopsy was
performed of the 0.6 cm LOWER INNER LEFT breast mass using a LATERAL
approach. At the conclusion of the procedure, a BARBELL tissue
marker clip was deployed into the biopsy cavity. Follow-up 2-view
mammogram was performed and dictated separately.
IMPRESSION: MRI guided biopsies of a 0.6 cm UPPER central LEFT breast mass
(CYLINDER clip) and a 0.6 cm LOWER INNER LEFT breast mass (BARBELL
clip). No apparent complications.

## 2021-12-14 MED ORDER — GADOBUTROL 1 MMOL/ML IV SOLN
9.0000 mL | Freq: Once | INTRAVENOUS | Status: AC | PRN
  Administered 2021-12-14: 9 mL via INTRAVENOUS

## 2021-12-14 NOTE — H&P (Signed)
PROVIDER: Beverlee Nims, MD  MRN: [id number] DOB: Mar 12, 1971 DATE OF ENCOUNTER: 12/02/2021 Subjective   Chief Complaint: Breast Cancer   History of Present Illness: Julie Atkinson is a 51 y.o. female who is seen today as an office consultation for evaluation of Breast Cancer .   This is a 51 year old female who palpated a mass in her right breast. She underwent mammogram and ultrasound. This showed a 2.4 x 3.0 x 1.5 cm mass 6 mm from nipple. There were calcifications and ducts dilated making a total area approximately 4.2 cm. The mass and near calcifications and ductal dilation were both biopsied. This showed invasive and in situ lobular and ductal carcinoma. It was 60% ER and PR positive, HER2 positive, and had a Ki-67 of 15%. She is otherwise healthy and without complaints. She has been having pain at the nipple with biopsy. She has no nipple discharge. She has no history of cardiopulmonary problems.  Review of Systems: A complete review of systems was obtained from the patient. I have reviewed this information and discussed as appropriate with the patient. See HPI as well for other ROS.  ROS   Medical History: Past Medical History:  Diagnosis Date   Anxiety   History of cancer   There is no problem list on file for this patient.  Past Surgical History:  Procedure Laterality Date   bladder surgery   c sectioon    Allergies  Allergen Reactions   Albuterol Other (See Comments)  Too jottery Too jottery   Codeine Nausea And Vomiting   Current Outpatient Medications on File Prior to Visit  Medication Sig Dispense Refill   clonazePAM (KLONOPIN) 0.5 MG disintegrating tablet   FLUoxetine (PROZAC) 20 MG capsule   meloxicam (MOBIC) 7.5 MG tablet Take by mouth   predniSONE (DELTASONE) 10 mg tablet pack Take per package instructions. Do not skip doses. Finish entire supply.   traZODone (DESYREL) 50 MG tablet   No current facility-administered medications on file prior to  visit.   Family History  Problem Relation Age of Onset   Deep vein thrombosis (DVT or abnormal blood clot formation) Mother   Myocardial Infarction (Heart attack) Mother   Breast cancer Maternal Aunt   Obesity Maternal Grandmother   High blood pressure (Hypertension) Maternal Grandmother   Diabetes Maternal Grandmother    Social History   Tobacco Use  Smoking Status Former   Types: Cigarettes   Quit date: 04/06/2014   Years since quitting: 7.6  Smokeless Tobacco Never    Social History   Socioeconomic History   Marital status: Unknown  Tobacco Use   Smoking status: Former  Types: Cigarettes  Quit date: 04/06/2014  Years since quitting: 7.6   Smokeless tobacco: Never  Substance and Sexual Activity   Alcohol use: Yes   Drug use: Never   Objective:  There were no vitals filed for this visit.  There is no height or weight on file to calculate BMI.  Physical Exam   She appears well on exam  There is ecchymosis and hematoma on the right breast superficially from the biopsy. The deeper mass is in the upper outer quadrant deep to the hematoma. It is mobile.  There is no axillary adenopathy   Lungs clear CV RRR  Labs, Imaging and Diagnostic Testing: I have reviewed her mammograms, ultrasound, and biopsy results  Assessment and Plan:   Diagnoses and all orders for this visit:  Primary invasive malignant neoplasm of female breast, right (CMS-HCC)    We  have discussed her at length in a multidisciplinary breast cancer conference. Because of the size and nature of palpable right breast cancer including it being HER2 positive, neoadjuvant chemotherapy is recommended. We discussed the reasons for that with her in detail. From a surgical standpoint this would hopefully decrease the size of the tumor allowing her to proceed with a lumpectomy and breast conservation versus a mastectomy. She will just need a Port-A-Cath inserted for IV chemotherapy. I discussed procedure  Port-A-Cath insertion with her. We discussed the risks which include but is not limited to bleeding, infection, injury to surrounding structures, pneumothorax, etc. She understands and agrees to proceed with Port-A-Cath insertion. We will continue to follow her throughout her treatment and will have further discussions about lumpectomy versus mastectomy given her response to treatment. I encouraged her to call me with any further questions especially from a surgical standpoint.

## 2021-12-15 ENCOUNTER — Ambulatory Visit (HOSPITAL_BASED_OUTPATIENT_CLINIC_OR_DEPARTMENT_OTHER)
Admission: RE | Admit: 2021-12-15 | Discharge: 2021-12-15 | Disposition: A | Discharge status: Home / Self Care | Payer: BC Managed Care – PPO | Source: Ambulatory Visit | Attending: Surgery | Admitting: Surgery

## 2021-12-15 ENCOUNTER — Ambulatory Visit (HOSPITAL_COMMUNITY): Payer: BC Managed Care – PPO

## 2021-12-15 ENCOUNTER — Encounter (HOSPITAL_BASED_OUTPATIENT_CLINIC_OR_DEPARTMENT_OTHER): Payer: Self-pay | Admitting: Surgery

## 2021-12-15 ENCOUNTER — Other Ambulatory Visit: Payer: Self-pay

## 2021-12-15 ENCOUNTER — Other Ambulatory Visit: Payer: Self-pay | Admitting: Hematology and Oncology

## 2021-12-15 ENCOUNTER — Ambulatory Visit (HOSPITAL_BASED_OUTPATIENT_CLINIC_OR_DEPARTMENT_OTHER): Payer: BC Managed Care – PPO | Admitting: Certified Registered"

## 2021-12-15 ENCOUNTER — Encounter (HOSPITAL_BASED_OUTPATIENT_CLINIC_OR_DEPARTMENT_OTHER)
Admission: RE | Disposition: A | Discharge status: Home / Self Care | Payer: Self-pay | Source: Ambulatory Visit | Attending: Surgery

## 2021-12-15 DIAGNOSIS — Z452 Encounter for adjustment and management of vascular access device: Secondary | ICD-10-CM | POA: Diagnosis not present

## 2021-12-15 DIAGNOSIS — C50411 Malignant neoplasm of upper-outer quadrant of right female breast: Secondary | ICD-10-CM | POA: Insufficient documentation

## 2021-12-15 DIAGNOSIS — Z170 Estrogen receptor positive status [ER+]: Secondary | ICD-10-CM | POA: Diagnosis not present

## 2021-12-15 DIAGNOSIS — Z87891 Personal history of nicotine dependence: Secondary | ICD-10-CM | POA: Diagnosis not present

## 2021-12-15 DIAGNOSIS — C50919 Malignant neoplasm of unspecified site of unspecified female breast: Secondary | ICD-10-CM | POA: Diagnosis not present

## 2021-12-15 DIAGNOSIS — C50911 Malignant neoplasm of unspecified site of right female breast: Secondary | ICD-10-CM | POA: Diagnosis not present

## 2021-12-15 HISTORY — PX: PORTACATH PLACEMENT: SHX2246

## 2021-12-15 LAB — POCT PREGNANCY, URINE: Preg Test, Ur: NEGATIVE

## 2021-12-15 SURGERY — INSERTION, TUNNELED CENTRAL VENOUS DEVICE, WITH PORT
Anesthesia: General | Site: Chest | Laterality: Left

## 2021-12-15 MED ORDER — CEFAZOLIN SODIUM-DEXTROSE 2-4 GM/100ML-% IV SOLN
INTRAVENOUS | Status: AC
  Filled 2021-12-15: qty 100

## 2021-12-15 MED ORDER — LACTATED RINGERS IV SOLN: INTRAVENOUS | Status: DC

## 2021-12-15 MED ORDER — MIDAZOLAM HCL 5 MG/5ML IJ SOLN
INTRAMUSCULAR | Status: DC | PRN
  Administered 2021-12-15: 2 mg via INTRAVENOUS

## 2021-12-15 MED ORDER — PROPOFOL 10 MG/ML IV BOLUS
INTRAVENOUS | Status: DC | PRN
  Administered 2021-12-15: 200 mg via INTRAVENOUS

## 2021-12-15 MED ORDER — LIDOCAINE 2% (20 MG/ML) 5 ML SYRINGE
INTRAMUSCULAR | Status: AC
  Filled 2021-12-15: qty 5

## 2021-12-15 MED ORDER — FENTANYL CITRATE (PF) 100 MCG/2ML IJ SOLN
INTRAMUSCULAR | Status: AC
  Filled 2021-12-15: qty 2

## 2021-12-15 MED ORDER — HEPARIN SOD (PORK) LOCK FLUSH 100 UNIT/ML IV SOLN
INTRAVENOUS | Status: DC | PRN
  Administered 2021-12-15: 500 [IU] via INTRAVENOUS

## 2021-12-15 MED ORDER — BUPIVACAINE-EPINEPHRINE 0.5% -1:200000 IJ SOLN
INTRAMUSCULAR | Status: DC | PRN
  Administered 2021-12-15: 10 mL

## 2021-12-15 MED ORDER — AMISULPRIDE (ANTIEMETIC) 5 MG/2ML IV SOLN: 10.0000 mg | Freq: Once | INTRAVENOUS | Status: DC | PRN

## 2021-12-15 MED ORDER — CEFAZOLIN SODIUM-DEXTROSE 2-4 GM/100ML-% IV SOLN
2.0000 g | INTRAVENOUS | Status: AC
  Administered 2021-12-15: 2 g via INTRAVENOUS

## 2021-12-15 MED ORDER — FENTANYL CITRATE (PF) 100 MCG/2ML IJ SOLN
INTRAMUSCULAR | Status: DC | PRN
  Administered 2021-12-15 (×2): 50 ug via INTRAVENOUS

## 2021-12-15 MED ORDER — CHLORHEXIDINE GLUCONATE CLOTH 2 % EX PADS: 6.0000 | MEDICATED_PAD | Freq: Once | CUTANEOUS | Status: DC

## 2021-12-15 MED ORDER — DEXAMETHASONE SODIUM PHOSPHATE 10 MG/ML IJ SOLN
INTRAMUSCULAR | Status: DC | PRN
  Administered 2021-12-15: 10 mg via INTRAVENOUS

## 2021-12-15 MED ORDER — PROPOFOL 10 MG/ML IV BOLUS
INTRAVENOUS | Status: AC
  Filled 2021-12-15: qty 20

## 2021-12-15 MED ORDER — FENTANYL CITRATE (PF) 100 MCG/2ML IJ SOLN: 25.0000 ug | INTRAMUSCULAR | Status: DC | PRN

## 2021-12-15 MED ORDER — HEPARIN SOD (PORK) LOCK FLUSH 100 UNIT/ML IV SOLN
INTRAVENOUS | Status: AC
  Filled 2021-12-15: qty 5

## 2021-12-15 MED ORDER — HEPARIN (PORCINE) IN NACL 1000-0.9 UT/500ML-% IV SOLN
INTRAVENOUS | Status: AC
  Filled 2021-12-15: qty 500

## 2021-12-15 MED ORDER — HEPARIN (PORCINE) IN NACL 2-0.9 UNITS/ML
INTRAMUSCULAR | Status: AC | PRN
  Administered 2021-12-15: 1

## 2021-12-15 MED ORDER — MIDAZOLAM HCL 2 MG/2ML IJ SOLN
INTRAMUSCULAR | Status: AC
  Filled 2021-12-15: qty 2

## 2021-12-15 MED ORDER — ONDANSETRON HCL 4 MG/2ML IJ SOLN
INTRAMUSCULAR | Status: DC | PRN
  Administered 2021-12-15: 4 mg via INTRAVENOUS

## 2021-12-15 MED ORDER — LORAZEPAM 0.5 MG PO TABS: 0.5000 mg | ORAL_TABLET | Freq: Every evening | ORAL | 0 refills | Status: DC | PRN

## 2021-12-15 MED ORDER — PROMETHAZINE HCL 25 MG/ML IJ SOLN: 6.2500 mg | INTRAMUSCULAR | Status: DC | PRN

## 2021-12-15 MED ORDER — ACETAMINOPHEN 500 MG PO TABS
1000.0000 mg | ORAL_TABLET | ORAL | Status: AC
  Administered 2021-12-15: 1000 mg via ORAL

## 2021-12-15 MED ORDER — SCOPOLAMINE 1 MG/3DAYS TD PT72
MEDICATED_PATCH | TRANSDERMAL | Status: AC
  Filled 2021-12-15: qty 1

## 2021-12-15 MED ORDER — DEXAMETHASONE SODIUM PHOSPHATE 10 MG/ML IJ SOLN
INTRAMUSCULAR | Status: AC
  Filled 2021-12-15: qty 1

## 2021-12-15 MED ORDER — TRAMADOL HCL 50 MG PO TABS
50.0000 mg | ORAL_TABLET | Freq: Four times a day (QID) | ORAL | 0 refills | Status: DC | PRN
Start: 2021-12-15 — End: 2022-03-10

## 2021-12-15 MED ORDER — ONDANSETRON HCL 4 MG/2ML IJ SOLN
INTRAMUSCULAR | Status: AC
  Filled 2021-12-15: qty 2

## 2021-12-15 MED ORDER — SCOPOLAMINE 1 MG/3DAYS TD PT72
1.0000 | MEDICATED_PATCH | TRANSDERMAL | Status: DC
  Administered 2021-12-15: 1.5 mg via TRANSDERMAL

## 2021-12-15 MED ORDER — LIDOCAINE HCL (CARDIAC) PF 100 MG/5ML IV SOSY
PREFILLED_SYRINGE | INTRAVENOUS | Status: DC | PRN
  Administered 2021-12-15: 80 mg via INTRAVENOUS

## 2021-12-15 MED ORDER — ACETAMINOPHEN 500 MG PO TABS
ORAL_TABLET | ORAL | Status: AC
  Filled 2021-12-15: qty 2

## 2021-12-15 MED FILL — Dexamethasone Sodium Phosphate Inj 100 MG/10ML: INTRAMUSCULAR | Qty: 1 | Status: AC

## 2021-12-15 MED FILL — Fosaprepitant Dimeglumine For IV Infusion 150 MG (Base Eq): INTRAVENOUS | Qty: 5 | Status: AC

## 2021-12-15 SURGICAL SUPPLY — 35 items
ADH SKN CLS APL DERMABOND .7 (GAUZE/BANDAGES/DRESSINGS) ×2
APL PRP STRL LF DISP 70% ISPRP (MISCELLANEOUS) ×1
BAG DECANTER FOR FLEXI CONT (MISCELLANEOUS) ×2 IMPLANT
BLADE SURG 15 STRL LF DISP TIS (BLADE) ×1 IMPLANT
BLADE SURG 15 STRL SS (BLADE) ×2
CHLORAPREP W/TINT 26 (MISCELLANEOUS) ×2 IMPLANT
COVER BACK TABLE 60X90IN (DRAPES) ×2 IMPLANT
COVER MAYO STAND STRL (DRAPES) ×2 IMPLANT
DERMABOND ADVANCED (GAUZE/BANDAGES/DRESSINGS) ×2
DERMABOND ADVANCED .7 DNX12 (GAUZE/BANDAGES/DRESSINGS) ×2 IMPLANT
DRAPE C-ARM 42X72 X-RAY (DRAPES) ×2 IMPLANT
DRAPE LAPAROSCOPIC ABDOMINAL (DRAPES) ×2 IMPLANT
DRAPE UTILITY XL STRL (DRAPES) ×2 IMPLANT
ELECT REM PT RETURN 9FT ADLT (ELECTROSURGICAL) ×2
ELECTRODE REM PT RTRN 9FT ADLT (ELECTROSURGICAL) ×1 IMPLANT
GLOVE SURG POLYISO LF SZ7 (GLOVE) ×1 IMPLANT
GLOVE SURG SIGNA 7.5 PF LTX (GLOVE) ×2 IMPLANT
GLOVE SURG UNDER POLY LF SZ7 (GLOVE) ×1 IMPLANT
GOWN STRL REUS W/ TWL LRG LVL3 (GOWN DISPOSABLE) ×1 IMPLANT
GOWN STRL REUS W/ TWL XL LVL3 (GOWN DISPOSABLE) ×1 IMPLANT
GOWN STRL REUS W/TWL LRG LVL3 (GOWN DISPOSABLE) ×2
GOWN STRL REUS W/TWL XL LVL3 (GOWN DISPOSABLE) ×2
KIT PORT POWER 8FR ISP CVUE (Port) ×1 IMPLANT
NDL HYPO 25X1 1.5 SAFETY (NEEDLE) ×1 IMPLANT
NEEDLE HYPO 25X1 1.5 SAFETY (NEEDLE) ×2 IMPLANT
PACK BASIN DAY SURGERY FS (CUSTOM PROCEDURE TRAY) ×2 IMPLANT
PENCIL SMOKE EVACUATOR (MISCELLANEOUS) ×2 IMPLANT
SLEEVE SCD COMPRESS KNEE MED (STOCKING) ×2 IMPLANT
SUT MNCRL AB 4-0 PS2 18 (SUTURE) ×2 IMPLANT
SUT PROLENE 2 0 SH DA (SUTURE) ×2 IMPLANT
SUT VIC AB 3-0 SH 27 (SUTURE) ×2
SUT VIC AB 3-0 SH 27X BRD (SUTURE) ×1 IMPLANT
SYR CONTROL 10ML LL (SYRINGE) ×2 IMPLANT
TOWEL GREEN STERILE FF (TOWEL DISPOSABLE) ×2 IMPLANT
TUBE CONNECTING 20X1/4 (TUBING) IMPLANT

## 2021-12-15 NOTE — Op Note (Signed)
INSERTION PORT-A-CATH  Procedure Note  Julie Atkinson 12/15/2021   Pre-op Diagnosis: BREAST CANCER     Post-op Diagnosis: same  Procedure(s): INSERTION PORT-A-CATH LEFT SUBCLAVIAN VEIN (8 FR)  Surgeon(s): Coralie Keens, MD  Anesthesia: General  Staff:  Circulator: Izora Ribas, RN Relief Scrub: McDonough-Hughes, Delene Ruffini, RN Scrub Person: Lorenza Burton, CST  Estimated Blood Loss: Minimal               Procedure: The patient was brought to the operating room and identifies the correct patient.  She was placed upon the operating table general anesthesia was induced.  Her left neck and chest were then prepped and draped in usual sterile fashion.  The patient was placed in the Trendelenburg position.  I anesthetized the skin of the left chest with Marcaine and anesthetize the clavicle as well.  I then used the introducer needle to easily cannulate the left subclavian vein.  A wire was passed through the needle and into the central venous system under fluoroscopy.  The needle was then removed.  I anesthetized skin further with Marcaine and then made incision at the wire site with a scalpel.  I then dissected down to the subcutaneous tissue with the cautery.  I then created a pocket for the port with the cautery.  An 8 French [place] port was brought onto the field and flushed appropriately.  The catheter was attached to the port and cut an appropriate length.  I then fed the peel-away sheath and venous dilator over the wire and into the central venous system.  The dilator and wire were then removed.  The port was placed into the pocket and the catheter was fed down the peel-away sheath as the sheath was peeled away.  Placement in the vena cava appeared to be achieved.  I accessed the port and good flush and return were demonstrated.  I then closed subtenons tissue with interrupted 3-0 Vicryl sutures and closed the skin with a running 4-0 Monocryl.  Dermabond was then applied.  The port was  again accessed through the skin and good flush and return were demonstrated.  It was then instilled with concentrated heparin solution and the port was last accessed.  Dermabond is placed to the incision.  A bulky dressing was then applied.  The patient tolerated the procedure well.  All the counts were correct at the end of the procedure.  The patient was then extubated in the operating room and taken in stable condition to the recovery room.          Coralie Keens   Date: 12/15/2021  Time: 3:15 PM

## 2021-12-15 NOTE — Progress Notes (Signed)
Patient Care Team: [name], NP as PCP - General (Adult Health Nurse Practitioner) Rockwell Germany, RN as Oncology Nurse Navigator Mauro Kaufmann, RN as Oncology Nurse Navigator Coralie Keens, MD as Consulting Physician (General Surgery) Nicholas Lose, MD as Consulting Physician (Hematology and Oncology) Gery Pray, MD as Consulting Physician (Radiation Oncology)  DIAGNOSIS:    ICD-10-CM   1. Malignant neoplasm of upper-outer quadrant of right breast in female, estrogen receptor positive ([place])  C50.411    Z17.0       SUMMARY OF ONCOLOGIC HISTORY: Oncology History  Malignant neoplasm of upper-outer quadrant of right female breast ([place])  11/19/2021 Initial Diagnosis   Palpable lump for 3 weeks, diagnostic mammogram: 2.2 cm x 2.2 cm irregular mass with a spiculated margin in the upper outer right breast. 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 HER2 positive, Ki-67 15%   12/02/2021 Cancer Staging   Staging form: Breast, AJCC 8th Edition - Clinical stage from 12/02/2021: Stage IB (cT2, cN0, cM0, G2, ER+, PR+, HER2+) - Signed by Nicholas Lose, MD on 12/02/2021 Stage prefix: Initial diagnosis Histologic grading system: 3 grade system    12/16/2021 -  Chemotherapy   Patient is on Treatment Plan : BREAST  Docetaxel + Carboplatin + Trastuzumab + Pertuzumab  (TCHP) q21d        CHIEF COMPLIANT: Cycle 1 TCHP  INTERVAL HISTORY: Julie Atkinson is a 51 y.o. with above-mentioned history of right breast cancer, to start chemotherapy with TCHP. She presents to the clinic today for treatment.  She had undergone breast MRI which unfortunately showed an abnormal lymph node as well as additional changes in the left breast.  Both of which are being scheduled to undergo biopsies.  She is anxious with starting chemotherapy today.  ALLERGIES:  is allergic to codeine.  MEDICATIONS:  Current Outpatient Medications  Medication Sig Dispense Refill   clonazePAM (KLONOPIN) 0.5 MG  disintegrating tablet Take 1 tablet (0.5 mg total) by mouth 2 (two) times daily. 30 tablet 0   dexamethasone (DECADRON) 4 MG tablet Take 1 tablet (4 mg total) by mouth daily. Take 1 tablet day before chemo and 1 tablet day after chemo with food 12 tablet 0   lidocaine-prilocaine (EMLA) cream Apply to affected area once 30 g 3   LORazepam (ATIVAN) 0.5 MG tablet Take 1 tablet (0.5 mg total) by mouth at bedtime as needed for anxiety. 30 tablet 0   ondansetron (ZOFRAN) 8 MG tablet Take 1 tablet (8 mg total) by mouth 2 (two) times daily as needed (Nausea or vomiting). Start on the third day after chemotherapy. 30 tablet 1   predniSONE (DELTASONE) 50 MG tablet Take 1 tablet by mouth 13, 7, and 1 hour prior to procedure 3 tablet 0   predniSONE (STERAPRED UNI-PAK 21 TAB) 10 MG (21) TBPK tablet Take per package instructions. Do not skip doses. Finish entire supply. 1 each 0   prochlorperazine (COMPAZINE) 10 MG tablet Take 1 tablet (10 mg total) by mouth every 6 (six) hours as needed (Nausea or vomiting). 30 tablet 1   traMADol (ULTRAM) 50 MG tablet Take 1 tablet (50 mg total) by mouth every 6 (six) hours as needed. 20 tablet 0   traZODone (DESYREL) 50 MG tablet Take 0.5-1 tablets (25-50 mg total) by mouth at bedtime as needed for sleep. 30 tablet 3   No current facility-administered medications for this visit.    PHYSICAL EXAMINATION: ECOG PERFORMANCE STATUS: 1 - Symptomatic but completely ambulatory  There were  no vitals filed for this visit. There were no vitals filed for this visit.    LABORATORY DATA:  I have reviewed the data as listed CMP Latest Ref Rng & Units 12/02/2021 10/08/2021 04/19/2019  Glucose 70 - 99 mg/dL 105(H) 80 77  BUN 6 - 20 mg/dL _0 Creatinine 0.44 - 1.00 mg/dL 0.75 0.82 0.76  Sodium 135 - 145 mmol/L 141 139 136  Potassium 3.5 - 5.1 mmol/L 4.1 4.3 4.3  Chloride 98 - 111 mmol/L 109 106 105  CO2 22 - 32 mmol/L 25 24 18(L)  Calcium 8.9 - 10.3 mg/dL 9.1 9.6 8.5(L)  Total  Protein 6.5 - 8.1 g/dL 7.0 7.4 6.7  Total Bilirubin 0.3 - 1.2 mg/dL 0.4 0.4 0.3  Alkaline Phos 38 - 126 U/L 41 39 42  AST 15 - 41 U/L _1 ALT 0 - 44 U/L _2 Lab Results  Component Value Date   WBC 6.2 12/02/2021   HGB 13.5 12/02/2021   HCT 39.7 12/02/2021   MCV 97.5 12/02/2021   PLT 200 12/02/2021   NEUTROABS 4.2 12/02/2021    ASSESSMENT & PLAN:  Malignant neoplasm of upper-outer quadrant of right female breast ([place]) 11/19/2021:Palpable lump for 3 weeks, diagnostic mammogram: 2.2 cm x 2.2 cm irregular mass with a spiculated margin in the upper outer right breast. 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 Neoadjuvant chemotherapy with TCHP foll by HP vs Kadcyla maintenance 2. Followed by breast conserving surgery with sentinel lymph node study vs targeted axillary dissection 3. Followed by adjuvant radiation therapy ------------------------------------------------------------------------------------------------------------------------------------ Current Treatment: Cycle 1 TCHP Labs reviewed, chemo education completed, chemo consent obtained, antiemetics reviewed ECHO : 12/11/21: EF 60-65% Patient wishes to meet with the financial counselors today.  We will arrange for that. I sent a prescription for Ativan yesterday. RTC in 1 week for tox check   No orders of the defined types were placed in this encounter.  The patient has a good understanding of the overall plan. she agrees with it. she will call with any problems that may develop before the next visit here.  Total time spent: 30  mins including face to face time and time spent for planning, charting and coordination of care  Rulon Eisenmenger, MD, MPH 12/16/2021  I, Thana Ates, am acting as scribe for Dr. Nicholas Lose.  I have reviewed the above documentation for accuracy and completeness, and I agree with the above.

## 2021-12-15 NOTE — Transfer of Care (Signed)
Immediate Anesthesia Transfer of Care Note  Patient: Julie Atkinson  Procedure(s) Performed: INSERTION PORT-A-CATH (Left: Chest)  Patient Location: PACU  Anesthesia Type:General  Level of Consciousness: awake  Airway & Oxygen Therapy: Patient Spontanous Breathing and Patient connected to face mask oxygen  Post-op Assessment: Report given to RN and Post -op Vital signs reviewed and stable  Post vital signs: Reviewed and stable  Last Vitals:  Vitals Value Taken Time  BP    Temp    Pulse 80 12/15/21 1520  Resp    SpO2 100 % 12/15/21 1520  Vitals shown include unvalidated device data.  Last Pain:  Vitals:   12/15/21 1023  TempSrc: Oral  PainSc: 8       Patients Stated Pain Goal: 4 (91/47/82 [id number])  Complications: No notable events documented.

## 2021-12-15 NOTE — Interval H&P Note (Signed)
History and Physical Interval Note:no change in H and P  12/15/2021 11:02 AM  Julie Atkinson  has presented today for surgery, with the diagnosis of BREAST CANCER.  The various methods of treatment have been discussed with the patient and family. After consideration of risks, benefits and other options for treatment, the patient has consented to  Procedure(s): INSERTION PORT-A-CATH (N/A) as a surgical intervention.  The patient's history has been reviewed, patient examined, no change in status, stable for surgery.  I have reviewed the patient's chart and labs.  Questions were answered to the patient's satisfaction.     Coralie Keens

## 2021-12-15 NOTE — Discharge Instructions (Addendum)
Ok to shower once the port has been deaccessed tomorrow  Tylenol, ibuprofen, and ice pack also for pain  No vigorous activity for 1 week   Post Anesthesia Home Care Instructions  Activity: Get plenty of rest for the remainder of the day. A responsible individual must stay with you for 24 hours following the procedure.  For the next 24 hours, DO NOT: -Drive a car -Paediatric nurse -Drink alcoholic beverages -Take any medication unless instructed by your physician -Make any legal decisions or sign important papers.  Meals: Start with liquid foods such as gelatin or soup. Progress to regular foods as tolerated. Avoid greasy, spicy, heavy foods. If nausea and/or vomiting occur, drink only clear liquids until the nausea and/or vomiting subsides. Call your physician if vomiting continues.  Special Instructions/Symptoms: Your throat may feel dry or sore from the anesthesia or the breathing tube placed in your throat during surgery. If this causes discomfort, gargle with warm salt water. The discomfort should disappear within 24 hours.  If you had a scopolamine patch placed behind your ear for the management of post- operative nausea and/or vomiting:  1. The medication in the patch is effective for 72 hours, after which it should be removed.  Wrap patch in a tissue and discard in the trash. Wash hands thoroughly with soap and water. 2. You may remove the patch earlier than 72 hours if you experience unpleasant side effects which may include dry mouth, dizziness or visual disturbances. 3. Avoid touching the patch. Wash your hands with soap and water after contact with the patch.

## 2021-12-15 NOTE — Assessment & Plan Note (Addendum)
11/19/2021:Palpable lump for 3 weeks, diagnostic mammogram: 2.2 cm x 2.2 cm irregular mass with a spiculated margin in the upper outer right breast. 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 Neoadjuvant chemotherapy with TCHP foll by HP vs Kadcyla maintenance 2. Followed by breast conserving surgery with sentinel lymph node study vs targeted axillary dissection 3. Followed by adjuvant radiation therapy ------------------------------------------------------------------------------------------------------------------------------------ Current Treatment: Cycle 1 TCHP Labs reviewed, chemo education completed, chemo consent obtained, antiemetics reviewed ECHO : 12/11/21: EF 60-65% RTC in 1 week for tox check

## 2021-12-15 NOTE — Anesthesia Preprocedure Evaluation (Addendum)
Anesthesia Evaluation  Patient identified by MRN, date of birth, ID band Patient awake    Reviewed: Allergy & Precautions, NPO status , Patient's Chart, lab work & pertinent test results  Airway Mallampati: II  TM Distance: >3 FB Neck ROM: Full    Dental no notable dental hx.    Pulmonary neg pulmonary ROS, former smoker,    Pulmonary exam normal breath sounds clear to auscultation       Cardiovascular negative cardio ROS Normal cardiovascular exam Rhythm:Regular Rate:Normal     Neuro/Psych PSYCHIATRIC DISORDERS Anxiety Depression negative neurological ROS     GI/Hepatic negative GI ROS, Neg liver ROS,   Endo/Other  negative endocrine ROS  Renal/GU negative Renal ROS  negative genitourinary   Musculoskeletal negative musculoskeletal ROS (+)   Abdominal   Peds negative pediatric ROS (+)  Hematology negative hematology ROS (+)   Anesthesia Other Findings Breast cancer  Reproductive/Obstetrics negative OB ROS                            Anesthesia Physical Anesthesia Plan  ASA: 2  Anesthesia Plan: General   Post-op Pain Management:    Induction: Intravenous  PONV Risk Score and Plan: 3 and Treatment may vary due to age or medical condition, Ondansetron and Dexamethasone  Airway Management Planned: LMA  Additional Equipment: None  Intra-op Plan:   Post-operative Plan: Extubation in OR  Informed Consent: I have reviewed the patients History and Physical, chart, labs and discussed the procedure including the risks, benefits and alternatives for the proposed anesthesia with the patient or authorized representative who has indicated his/her understanding and acceptance.     Dental advisory given  Plan Discussed with: CRNA, Anesthesiologist and Surgeon  Anesthesia Plan Comments:         Anesthesia Quick Evaluation

## 2021-12-15 NOTE — Anesthesia Procedure Notes (Signed)
Procedure Name: LMA Insertion Date/Time: 12/15/2021 2:26 PM Performed by: Tawni Millers, CRNA Pre-anesthesia Checklist: Patient identified, Emergency Drugs available, Suction available and Patient being monitored Patient Re-evaluated:Patient Re-evaluated prior to induction Oxygen Delivery Method: Circle system utilized Preoxygenation: Pre-oxygenation with 100% oxygen Induction Type: IV induction Ventilation: Mask ventilation without difficulty LMA: LMA inserted LMA Size: 4.0 Number of attempts: 1 Airway Equipment and Method: Bite block Placement Confirmation: positive ETCO2 Tube secured with: Tape Dental Injury: Teeth and Oropharynx as per pre-operative assessment

## 2021-12-16 ENCOUNTER — Encounter: Payer: Self-pay | Admitting: *Deleted

## 2021-12-16 ENCOUNTER — Inpatient Hospital Stay: Payer: BC Managed Care – PPO

## 2021-12-16 ENCOUNTER — Inpatient Hospital Stay (HOSPITAL_BASED_OUTPATIENT_CLINIC_OR_DEPARTMENT_OTHER): Payer: BC Managed Care – PPO | Admitting: Hematology and Oncology

## 2021-12-16 ENCOUNTER — Encounter: Payer: Self-pay | Admitting: Hematology and Oncology

## 2021-12-16 VITALS — BP 145/96 | HR 59 | Temp 98.6°F | Resp 18

## 2021-12-16 DIAGNOSIS — Z95828 Presence of other vascular implants and grafts: Secondary | ICD-10-CM | POA: Insufficient documentation

## 2021-12-16 DIAGNOSIS — Z170 Estrogen receptor positive status [ER+]: Secondary | ICD-10-CM

## 2021-12-16 DIAGNOSIS — Z5112 Encounter for antineoplastic immunotherapy: Secondary | ICD-10-CM | POA: Diagnosis not present

## 2021-12-16 DIAGNOSIS — Z5189 Encounter for other specified aftercare: Secondary | ICD-10-CM | POA: Diagnosis not present

## 2021-12-16 DIAGNOSIS — C50411 Malignant neoplasm of upper-outer quadrant of right female breast: Secondary | ICD-10-CM

## 2021-12-16 DIAGNOSIS — Z5111 Encounter for antineoplastic chemotherapy: Secondary | ICD-10-CM | POA: Diagnosis not present

## 2021-12-16 DIAGNOSIS — Z79899 Other long term (current) drug therapy: Secondary | ICD-10-CM | POA: Diagnosis not present

## 2021-12-16 DIAGNOSIS — R197 Diarrhea, unspecified: Secondary | ICD-10-CM | POA: Diagnosis not present

## 2021-12-16 LAB — CMP (CANCER CENTER ONLY)
ALT: 11 U/L (ref 0–44)
AST: 13 U/L — ABNORMAL LOW (ref 15–41)
Albumin: 4.2 g/dL (ref 3.5–5.0)
Alkaline Phosphatase: 35 U/L — ABNORMAL LOW (ref 38–126)
Anion gap: 8 (ref 5–15)
BUN: 9 mg/dL (ref 6–20)
CO2: 22 mmol/L (ref 22–32)
Calcium: 9 mg/dL (ref 8.9–10.3)
Chloride: 104 mmol/L (ref 98–111)
Creatinine: 0.72 mg/dL (ref 0.44–1.00)
GFR, Estimated: >60 mL/min (ref >60)
Glucose, Bld: 154 mg/dL — ABNORMAL HIGH (ref 70–99)
Potassium: 3.7 mmol/L (ref 3.5–5.1)
Sodium: 134 mmol/L — ABNORMAL LOW (ref 135–145)
Total Bilirubin: 0.4 mg/dL (ref 0.3–1.2)
Total Protein: 7.1 g/dL (ref 6.5–8.1)

## 2021-12-16 LAB — CBC WITH DIFFERENTIAL (CANCER CENTER ONLY)
Abs Immature Granulocytes: 0.03 10*3/uL (ref 0.00–0.07)
Basophils Absolute: 0 10*3/uL (ref 0.0–0.1)
Basophils Relative: 0 %
Eosinophils Absolute: 0 10*3/uL (ref 0.0–0.5)
Eosinophils Relative: 0 %
HCT: 38.1 % (ref 36.0–46.0)
Hemoglobin: 13.1 g/dL (ref 12.0–15.0)
Immature Granulocytes: 0 %
Lymphocytes Relative: 11 %
Lymphs Abs: 1 10*3/uL (ref 0.7–4.0)
MCH: 33.2 pg (ref 26.0–34.0)
MCHC: 34.4 g/dL (ref 30.0–36.0)
MCV: 96.5 fL (ref 80.0–100.0)
Monocytes Absolute: 0.4 10*3/uL (ref 0.1–1.0)
Monocytes Relative: 5 %
Neutro Abs: 8 10*3/uL — ABNORMAL HIGH (ref 1.7–7.7)
Neutrophils Relative %: 84 %
Platelet Count: 204 10*3/uL (ref 150–400)
RBC: 3.95 MIL/uL (ref 3.87–5.11)
RDW: 11.7 % (ref 11.5–15.5)
WBC Count: 9.4 10*3/uL (ref 4.0–10.5)
nRBC: 0 % (ref 0.0–0.2)

## 2021-12-16 MED ORDER — TRASTUZUMAB-DKST CHEMO 150 MG IV SOLR
8.0000 mg/kg | Freq: Once | INTRAVENOUS | Status: AC
  Administered 2021-12-16: 714 mg via INTRAVENOUS
  Filled 2021-12-16: qty 34

## 2021-12-16 MED ORDER — SODIUM CHLORIDE 0.9 % IV SOLN
150.0000 mg | Freq: Once | INTRAVENOUS | Status: AC
  Administered 2021-12-16: 150 mg via INTRAVENOUS
  Filled 2021-12-16: qty 150

## 2021-12-16 MED ORDER — SODIUM CHLORIDE 0.9% FLUSH
10.0000 mL | Freq: Once | INTRAVENOUS | Status: AC
  Administered 2021-12-16: 10 mL

## 2021-12-16 MED ORDER — HEPARIN SOD (PORK) LOCK FLUSH 100 UNIT/ML IV SOLN: 500.0000 [IU] | Freq: Once | INTRAVENOUS | Status: DC | PRN

## 2021-12-16 MED ORDER — SODIUM CHLORIDE 0.9 % IV SOLN
10.0000 mg | Freq: Once | INTRAVENOUS | Status: AC
  Administered 2021-12-16: 10 mg via INTRAVENOUS
  Filled 2021-12-16: qty 10

## 2021-12-16 MED ORDER — DIPHENHYDRAMINE HCL 25 MG PO CAPS
50.0000 mg | ORAL_CAPSULE | Freq: Once | ORAL | Status: AC
  Administered 2021-12-16: 50 mg via ORAL
  Filled 2021-12-16: qty 2

## 2021-12-16 MED ORDER — SODIUM CHLORIDE 0.9 % IV SOLN
700.0000 mg | Freq: Once | INTRAVENOUS | Status: AC
  Administered 2021-12-16: 700 mg via INTRAVENOUS
  Filled 2021-12-16: qty 70

## 2021-12-16 MED ORDER — SODIUM CHLORIDE 0.9 % IV SOLN
420.0000 mg | Freq: Once | INTRAVENOUS | Status: AC
  Administered 2021-12-16: 420 mg via INTRAVENOUS
  Filled 2021-12-16: qty 14

## 2021-12-16 MED ORDER — SODIUM CHLORIDE 0.9% FLUSH: 10.0000 mL | INTRAVENOUS | Status: DC | PRN

## 2021-12-16 MED ORDER — SODIUM CHLORIDE 0.9 % IV SOLN: Freq: Once | INTRAVENOUS | Status: AC

## 2021-12-16 MED ORDER — PALONOSETRON HCL INJECTION 0.25 MG/5ML
0.2500 mg | Freq: Once | INTRAVENOUS | Status: AC
  Administered 2021-12-16: 0.25 mg via INTRAVENOUS
  Filled 2021-12-16: qty 5

## 2021-12-16 MED ORDER — ACETAMINOPHEN 325 MG PO TABS
650.0000 mg | ORAL_TABLET | Freq: Once | ORAL | Status: AC
  Administered 2021-12-16: 650 mg via ORAL
  Filled 2021-12-16: qty 2

## 2021-12-16 MED ORDER — SODIUM CHLORIDE 0.9 % IV SOLN
75.0000 mg/m2 | Freq: Once | INTRAVENOUS | Status: AC
  Administered 2021-12-16: 150 mg via INTRAVENOUS
  Filled 2021-12-16: qty 15

## 2021-12-16 NOTE — Patient Instructions (Signed)
[facility]ie [facility]  Discharge Instructions: Thank you for choosing [facility] to provide your [facility] and hematology care.   If you have a lab appointment with the [facility], please go directly to the [facility] and check in at the registration area.   Wear comfortable clothing and clothing appropriate for easy access to any Portacath or PICC line.   We strive to give you quality time with your provider. You may need to reschedule your appointment if you arrive late (15 or more minutes).  Arriving late affects you and other patients whose appointments are after yours.  Also, if you miss three or more appointments without notifying the office, you may be dismissed from the clinic at the providers discretion.      For prescription refill requests, have your pharmacy contact our office and allow 72 hours for refills to be completed.    Today you received the following chemotherapy and/or immunotherapy agents: Trastuzumab, Pertuzumab, Docetaxel, Carboplatin.      To help prevent nausea and vomiting after your treatment, we encourage you to take your nausea medication as directed.  BELOW ARE SYMPTOMS THAT SHOULD BE REPORTED IMMEDIATELY: *FEVER GREATER THAN 100.4 F (38 C) OR HIGHER *CHILLS OR SWEATING *NAUSEA AND VOMITING THAT IS NOT CONTROLLED WITH YOUR NAUSEA MEDICATION *UNUSUAL SHORTNESS OF BREATH *UNUSUAL BRUISING OR BLEEDING *URINARY PROBLEMS (pain or burning when urinating, or frequent urination) *BOWEL PROBLEMS (unusual diarrhea, constipation, pain near the anus) TENDERNESS IN MOUTH AND THROAT WITH OR WITHOUT PRESENCE OF ULCERS (sore throat, sores in mouth, or a toothache) UNUSUAL RASH, SWELLING OR PAIN  UNUSUAL VAGINAL DISCHARGE OR ITCHING   Items with * indicate a potential emergency and should be followed up as soon as possible or go to the Emergency Department if any problems should occur.  Please show the CHEMOTHERAPY ALERT CARD or  IMMUNOTHERAPY ALERT CARD at check-in to the Emergency Department and triage nurse.  Should you have questions after your visit or need to cancel or reschedule your appointment, please contact [facility]  Dept: [phone]  and follow the prompts.  Office hours are 8:00 a.m. to 4:30 p.m. Monday - Friday. Please note that voicemails left after 4:00 p.m. may not be returned until the following business day.  We are closed weekends and major holidays. You have access to a nurse at all times for urgent questions. Please call the main number to the clinic Dept: [phone] and follow the prompts.   For any non-urgent questions, you may also contact your provider using MyChart. We now offer e-Visits for anyone 28 and older to request care online for non-urgent symptoms. For details visit [email or web address].   Also download the MyChart app! Go to the app store, search "MyChart", open the app, select [facility], and log in with your MyChart username and password.  Due to Covid, a mask is required upon entering the hospital/clinic. If you do not have a mask, one will be given to you upon arrival. For doctor visits, patients may have 1 support person aged 21 or older with them. For treatment visits, patients cannot have anyone with them due to current Covid guidelines and our immunocompromised population.   Trastuzumab; Hyaluronidase injection What is this medication? TRASTUZUMAB; HYALURONIDASE (tras TOO zoo mab / hye al ur ON i dase) is used to treat breast cancer and stomach cancer. Trastuzumab is a monoclonal antibody. Hyaluronidase is used to improve the effects of trastuzumab. This medicine may  be used for other purposes; ask your health care provider or pharmacist if you have questions. COMMON BRAND NAME(S): HERCEPTIN HYLECTA What should I tell my care team before I take this medication? They need to know if you have any of these conditions: heart disease heart  failure lung or breathing disease, like asthma an unusual or allergic reaction to trastuzumab, or other medications, foods, dyes, or preservatives pregnant or trying to get pregnant breast-feeding How should I use this medication? This medicine is for injection under the skin. It is given by a health care professional in a hospital or clinic setting. Talk to your pediatrician regarding the use of this medicine in children. This medicine is not approved for use in children. Overdosage: If you think you have taken too much of this medicine contact a poison control center or emergency room at once. NOTE: This medicine is only for you. Do not share this medicine with others. What if I miss a dose? It is important not to miss a dose. Call your doctor or health care professional if you are unable to keep an appointment. What may interact with this medication? This medicine may interact with the following medications: certain types of chemotherapy, such as daunorubicin, doxorubicin, epirubicin, and idarubicin This list may not describe all possible interactions. Give your health care provider a list of all the medicines, herbs, non-prescription drugs, or dietary supplements you use. Also tell them if you smoke, drink alcohol, or use illegal drugs. Some items may interact with your medicine. What should I watch for while using this medication? Visit your doctor for checks on your progress. Report any side effects. Continue your course of treatment even though you feel ill unless your doctor tells you to stop. Call your doctor or health care professional for advice if you get a fever, chills or sore throat, or other symptoms of a cold or flu. Do not treat yourself. Try to avoid being around people who are sick. You may experience fever, chills and shaking during your first infusion. These effects are usually mild and can be treated with other medicines. Report any side effects during the infusion to your  health care professional. Fever and chills usually do not happen with later infusions. Do not become pregnant while taking this medicine or for 7 months after stopping it. Women should inform their doctor if they wish to become pregnant or think they might be pregnant. Women of child-bearing potential will need to have a negative pregnancy test before starting this medicine. There is a potential for serious side effects to an unborn child. Talk to your health care professional or pharmacist for more information. Do not breast-feed an infant while taking this medicine or for 7 months after stopping it. What side effects may I notice from receiving this medication? Side effects that you should report to your doctor or health care professional as soon as possible: allergic reactions like skin rash, itching or hives, swelling of the face, lips, or tongue breathing problems chest pain or palpitations cough fever general ill feeling or flu-like symptoms signs of worsening heart failure like breathing problems; swelling in your legs and feet Side effects that usually do not require medical attention (report these to your doctor or health care professional if they continue or are bothersome): bone pain changes in taste diarrhea joint pain nausea/vomiting unusually weak or tired weight loss This list may not describe all possible side effects. Call your doctor for medical advice about side effects. You may  report side effects to FDA at 1-800-FDA-1088. Where should I keep my medication? This drug is given in a hospital or clinic and will not be stored at home. NOTE: This sheet is a summary. It may not cover all possible information. If you have questions about this medicine, talk to your doctor, pharmacist, or health care provider.  2022 Elsevier/Gold Standard (2018-03-13 00:00:00)  Pertuzumab injection What is this medication? PERTUZUMAB (per TOOZ ue mab) is a monoclonal antibody. It is used to  treat breast cancer. This medicine may be used for other purposes; ask your health care provider or pharmacist if you have questions. COMMON BRAND NAME(S): PERJETA What should I tell my care team before I take this medication? They need to know if you have any of these conditions: heart disease heart failure high blood pressure history of irregular heart beat recent or ongoing radiation therapy an unusual or allergic reaction to pertuzumab, other medicines, foods, dyes, or preservatives pregnant or trying to get pregnant breast-feeding How should I use this medication? This medicine is for infusion into a vein. It is given by a health care professional in a hospital or clinic setting. Talk to your pediatrician regarding the use of this medicine in children. Special care may be needed. Overdosage: If you think you have taken too much of this medicine contact a poison control center or emergency room at once. NOTE: This medicine is only for you. Do not share this medicine with others. What if I miss a dose? It is important not to miss your dose. Call your doctor or health care professional if you are unable to keep an appointment. What may interact with this medication? Interactions are not expected. Give your health care provider a list of all the medicines, herbs, non-prescription drugs, or dietary supplements you use. Also tell them if you smoke, drink alcohol, or use illegal drugs. Some items may interact with your medicine. This list may not describe all possible interactions. Give your health care provider a list of all the medicines, herbs, non-prescription drugs, or dietary supplements you use. Also tell them if you smoke, drink alcohol, or use illegal drugs. Some items may interact with your medicine. What should I watch for while using this medication? Your condition will be monitored carefully while you are receiving this medicine. Report any side effects. Continue your course of  treatment even though you feel ill unless your doctor tells you to stop. Do not become pregnant while taking this medicine or for 7 months after stopping it. Women should inform their doctor if they wish to become pregnant or think they might be pregnant. Women of child-bearing potential will need to have a negative pregnancy test before starting this medicine. There is a potential for serious side effects to an unborn child. Talk to your health care professional or pharmacist for more information. Do not breast-feed an infant while taking this medicine or for 7 months after stopping it. Women must use effective birth control with this medicine. Call your doctor or health care professional for advice if you get a fever, chills or sore throat, or other symptoms of a cold or flu. Do not treat yourself. Try to avoid being around people who are sick. You may experience fever, chills, and headache during the infusion. Report any side effects during the infusion to your health care professional. What side effects may I notice from receiving this medication? Side effects that you should report to your doctor or health care professional as soon  as possible: breathing problems chest pain or palpitations dizziness feeling faint or lightheaded fever or chills skin rash, itching or hives sore throat swelling of the face, lips, or tongue swelling of the legs or ankles unusually weak or tired Side effects that usually do not require medical attention (report to your doctor or health care professional if they continue or are bothersome): diarrhea hair loss nausea, vomiting tiredness This list may not describe all possible side effects. Call your doctor for medical advice about side effects. You may report side effects to FDA at 1-800-FDA-1088. Where should I keep my medication? This drug is given in a hospital or clinic and will not be stored at home. NOTE: This sheet is a summary. It may not cover all  possible information. If you have questions about this medicine, talk to your doctor, pharmacist, or health care provider.  2022 Elsevier/Gold Standard (2015-11-27 00:00:00)  Docetaxel injection What is this medication? DOCETAXEL (doe se TAX el) is a chemotherapy drug. It targets fast dividing cells, like cancer cells, and causes these cells to die. This medicine is used to treat many types of cancers like breast cancer, certain stomach cancers, head and neck cancer, lung cancer, and prostate cancer. This medicine may be used for other purposes; ask your health care provider or pharmacist if you have questions. COMMON BRAND NAME(S): Docefrez, Taxotere What should I tell my care team before I take this medication? They need to know if you have any of these conditions: infection (especially a virus infection such as chickenpox, cold sores, or herpes) liver disease low blood counts, like low white cell, platelet, or red cell counts an unusual or allergic reaction to docetaxel, polysorbate 80, other chemotherapy agents, other medicines, foods, dyes, or preservatives pregnant or trying to get pregnant breast-feeding How should I use this medication? This drug is given as an infusion into a vein. It is administered in a hospital or clinic by a specially trained health care professional. Talk to your pediatrician regarding the use of this medicine in children. Special care may be needed. Overdosage: If you think you have taken too much of this medicine contact a poison control center or emergency room at once. NOTE: This medicine is only for you. Do not share this medicine with others. What if I miss a dose? It is important not to miss your dose. Call your doctor or health care professional if you are unable to keep an appointment. What may interact with this medication? Do not take this medicine with any of the following medications: live virus vaccines This medicine may also interact with the  following medications: aprepitant certain antibiotics like erythromycin or clarithromycin certain antivirals for HIV or hepatitis certain medicines for fungal infections like fluconazole, itraconazole, ketoconazole, posaconazole, or voriconazole cimetidine ciprofloxacin conivaptan cyclosporine dronedarone fluvoxamine grapefruit juice imatinib verapamil This list may not describe all possible interactions. Give your health care provider a list of all the medicines, herbs, non-prescription drugs, or dietary supplements you use. Also tell them if you smoke, drink alcohol, or use illegal drugs. Some items may interact with your medicine. What should I watch for while using this medication? Your condition will be monitored carefully while you are receiving this medicine. You will need important blood work done while you are taking this medicine. Call your doctor or health care professional for advice if you get a fever, chills or sore throat, or other symptoms of a cold or flu. Do not treat yourself. This drug decreases your body's  ability to fight infections. Try to avoid being around people who are sick. Some products may contain alcohol. Ask your health care professional if this medicine contains alcohol. Be sure to tell all health care professionals you are taking this medicine. Certain medicines, like metronidazole and disulfiram, can cause an unpleasant reaction when taken with alcohol. The reaction includes flushing, headache, nausea, vomiting, sweating, and increased thirst. The reaction can last from 30 minutes to several hours. You may get drowsy or dizzy. Do not drive, use machinery, or do anything that needs mental alertness until you know how this medicine affects you. Do not stand or sit up quickly, especially if you are an older patient. This reduces the risk of dizzy or fainting spells. Alcohol may interfere with the effect of this medicine. Talk to your health care professional about  your risk of cancer. You may be more at risk for certain types of cancer if you take this medicine. Do not become pregnant while taking this medicine or for 6 months after stopping it. Women should inform their doctor if they wish to become pregnant or think they might be pregnant. There is a potential for serious side effects to an unborn child. Talk to your health care professional or pharmacist for more information. Do not breast-feed an infant while taking this medicine or for 1 week after stopping it. Males who get this medicine must use a condom during sex with females who can get pregnant. If you get a woman pregnant, the baby could have birth defects. The baby could die before they are born. You will need to continue wearing a condom for 3 months after stopping the medicine. Tell your health care provider right away if your partner becomes pregnant while you are taking this medicine. This may interfere with the ability to father a child. You should talk to your doctor or health care professional if you are concerned about your fertility. What side effects may I notice from receiving this medication? Side effects that you should report to your doctor or health care professional as soon as possible: allergic reactions like skin rash, itching or hives, swelling of the face, lips, or tongue blurred vision breathing problems changes in vision low blood counts - This drug may decrease the number of white blood cells, red blood cells and platelets. You may be at increased risk for infections and bleeding. nausea and vomiting pain, redness or irritation at site where injected pain, tingling, numbness in the hands or feet redness, blistering, peeling, or loosening of the skin, including inside the mouth signs of decreased platelets or bleeding - bruising, pinpoint red spots on the skin, black, tarry stools, nosebleeds signs of decreased red blood cells - unusually weak or tired, fainting spells,  lightheadedness signs of infection - fever or chills, cough, sore throat, pain or difficulty passing urine swelling of the ankle, feet, hands Side effects that usually do not require medical attention (report to your doctor or health care professional if they continue or are bothersome): constipation diarrhea fingernail or toenail changes hair loss loss of appetite mouth sores muscle pain This list may not describe all possible side effects. Call your doctor for medical advice about side effects. You may report side effects to FDA at 1-800-FDA-1088. Where should I keep my medication? This drug is given in a hospital or clinic and will not be stored at home. NOTE: This sheet is a summary. It may not cover all possible information. If you have questions about this  medicine, talk to your doctor, pharmacist, or health care provider.  2022 Elsevier/Gold Standard (2021-07-14 00:00:00)  Carboplatin injection What is this medication? CARBOPLATIN (KAR boe pla tin) is a chemotherapy drug. It targets fast dividing cells, like cancer cells, and causes these cells to die. This medicine is used to treat ovarian cancer and many other cancers. This medicine may be used for other purposes; ask your health care provider or pharmacist if you have questions. COMMON BRAND NAME(S): Paraplatin What should I tell my care team before I take this medication? They need to know if you have any of these conditions: blood disorders hearing problems kidney disease recent or ongoing radiation therapy an unusual or allergic reaction to carboplatin, cisplatin, other chemotherapy, other medicines, foods, dyes, or preservatives pregnant or trying to get pregnant breast-feeding How should I use this medication? This drug is usually given as an infusion into a vein. It is administered in a hospital or clinic by a specially trained health care professional. Talk to your pediatrician regarding the use of this medicine in  children. Special care may be needed. Overdosage: If you think you have taken too much of this medicine contact a poison control center or emergency room at once. NOTE: This medicine is only for you. Do not share this medicine with others. What if I miss a dose? It is important not to miss a dose. Call your doctor or health care professional if you are unable to keep an appointment. What may interact with this medication? medicines for seizures medicines to increase blood counts like filgrastim, pegfilgrastim, sargramostim some antibiotics like amikacin, gentamicin, neomycin, streptomycin, tobramycin vaccines Talk to your doctor or health care professional before taking any of these medicines: acetaminophen aspirin ibuprofen ketoprofen naproxen This list may not describe all possible interactions. Give your health care provider a list of all the medicines, herbs, non-prescription drugs, or dietary supplements you use. Also tell them if you smoke, drink alcohol, or use illegal drugs. Some items may interact with your medicine. What should I watch for while using this medication? Your condition will be monitored carefully while you are receiving this medicine. You will need important blood work done while you are taking this medicine. This drug may make you feel generally unwell. This is not uncommon, as chemotherapy can affect healthy cells as well as cancer cells. Report any side effects. Continue your course of treatment even though you feel ill unless your doctor tells you to stop. In some cases, you may be given additional medicines to help with side effects. Follow all directions for their use. Call your doctor or health care professional for advice if you get a fever, chills or sore throat, or other symptoms of a cold or flu. Do not treat yourself. This drug decreases your body's ability to fight infections. Try to avoid being around people who are sick. This medicine may increase your  risk to bruise or bleed. Call your doctor or health care professional if you notice any unusual bleeding. Be careful brushing and flossing your teeth or using a toothpick because you may get an infection or bleed more easily. If you have any dental work done, tell your dentist you are receiving this medicine. Avoid taking products that contain aspirin, acetaminophen, ibuprofen, naproxen, or ketoprofen unless instructed by your doctor. These medicines may hide a fever. Do not become pregnant while taking this medicine. Women should inform their doctor if they wish to become pregnant or think they might be pregnant. There is  a potential for serious side effects to an unborn child. Talk to your health care professional or pharmacist for more information. Do not breast-feed an infant while taking this medicine. What side effects may I notice from receiving this medication? Side effects that you should report to your doctor or health care professional as soon as possible: allergic reactions like skin rash, itching or hives, swelling of the face, lips, or tongue signs of infection - fever or chills, cough, sore throat, pain or difficulty passing urine signs of decreased platelets or bleeding - bruising, pinpoint red spots on the skin, black, tarry stools, nosebleeds signs of decreased red blood cells - unusually weak or tired, fainting spells, lightheadedness breathing problems changes in hearing changes in vision chest pain high blood pressure low blood counts - This drug may decrease the number of white blood cells, red blood cells and platelets. You may be at increased risk for infections and bleeding. nausea and vomiting pain, swelling, redness or irritation at the injection site pain, tingling, numbness in the hands or feet problems with balance, talking, walking trouble passing urine or change in the amount of urine Side effects that usually do not require medical attention (report to your  doctor or health care professional if they continue or are bothersome): hair loss loss of appetite metallic taste in the mouth or changes in taste This list may not describe all possible side effects. Call your doctor for medical advice about side effects. You may report side effects to FDA at 1-800-FDA-1088. Where should I keep my medication? This drug is given in a hospital or clinic and will not be stored at home. NOTE: This sheet is a summary. It may not cover all possible information. If you have questions about this medicine, talk to your doctor, pharmacist, or health care provider.  2022 Elsevier/Gold Standard (2008-04-03 00:00:00)

## 2021-12-16 NOTE — Progress Notes (Signed)
Called pt to introduce myself as her Arboriculturist, discuss copay assistance and the [employer].  Pt gave me consent to apply in her behalf so I applied to the [employer].  She is approved for $25,000 for Perjeta eff 12/16/21.  I completed the online app with the [place] for [employer] and the [employer] w/ [employer] for [employer].  I will notify her of the outcomes once I receive it.  I also informed her of the [employer], went over what it covers and gave her the income requirement.  Pt would like to apply and will provide her proof of income at a later date.  If approved I will give her an expense sheet and my card for any questions or concerns she may have in the future.

## 2021-12-16 NOTE — Anesthesia Postprocedure Evaluation (Signed)
Anesthesia Post Note  Patient: Julie Atkinson  Procedure(s) Performed: INSERTION PORT-A-CATH (Left: Chest)     Patient location during evaluation: PACU Anesthesia Type: General Level of consciousness: awake Pain management: pain level controlled Vital Signs Assessment: post-procedure vital signs reviewed and stable Respiratory status: spontaneous breathing and respiratory function stable Cardiovascular status: stable Postop Assessment: no apparent nausea or vomiting Anesthetic complications: no   No notable events documented.  Last Vitals:  Vitals:   12/15/21 1545 12/15/21 1611  BP: 115/75 139/80  Pulse: (!) 57 (!) 58  Resp: 19 16  Temp:  36.6 C  SpO2: 98% 98%    Last Pain:  Vitals:   12/15/21 1611  TempSrc:   PainSc: 0-No pain                 Merlinda Frederick

## 2021-12-16 NOTE — Progress Notes (Signed)
[place] Social Work Progress Notes  Social Work Intern spoke with Ms.Mckeon in infusion, per request for follow-up from Jovita Kussmaul who is out today. Pt expressed she is feeling generally positive, although she has some financial concerns- mainly not knowing how much of her medical expenses will be covered by insurance. Financial resource specialist have already contacted pt, and intern encouraged pt to reach out to social work if she would like assistance applying for other assistance programs.  Pt asked for status on Secretary/administrator, and intern confirmed with Linda Hedges that she is working to find a Product manager for Ms. Holroyd.  Intern and pt discussed common feelings and emotions when being diagnosed with cancer, informed patient of the support team roles and support services at [facility]; Provided CSW contact information and encouraged patient to call with any questions or concerns. Pt appeared in good spirits and was appreciative of the check-in.   Rosary Lively, Social Work Intern Supervised by Gwinda Maine, LCSW

## 2021-12-17 ENCOUNTER — Encounter: Payer: Self-pay | Admitting: Hematology and Oncology

## 2021-12-17 ENCOUNTER — Encounter: Payer: Self-pay | Admitting: *Deleted

## 2021-12-17 ENCOUNTER — Encounter (HOSPITAL_BASED_OUTPATIENT_CLINIC_OR_DEPARTMENT_OTHER): Payer: Self-pay | Admitting: Surgery

## 2021-12-17 NOTE — Progress Notes (Signed)
Pt was approved for [place] from 12/17/21 to 12/16/22 for up to $25,000.  The program reduces pt's copay responsibility to $0.

## 2021-12-17 NOTE — Progress Notes (Signed)
Pt is enrolled in the [employer] program for [employer] for $15,000 for 12 months from 12/16/21.  Pt is eligible to have $0 OOP costs for each [employer].

## 2021-12-18 ENCOUNTER — Other Ambulatory Visit: Payer: Self-pay

## 2021-12-18 ENCOUNTER — Inpatient Hospital Stay: Payer: BC Managed Care – PPO

## 2021-12-18 ENCOUNTER — Ambulatory Visit: Payer: BC Managed Care – PPO

## 2021-12-18 VITALS — BP 141/87 | HR 72 | Temp 100.0°F | Resp 20

## 2021-12-18 DIAGNOSIS — Z79899 Other long term (current) drug therapy: Secondary | ICD-10-CM | POA: Diagnosis not present

## 2021-12-18 DIAGNOSIS — Z5111 Encounter for antineoplastic chemotherapy: Secondary | ICD-10-CM | POA: Diagnosis not present

## 2021-12-18 DIAGNOSIS — R197 Diarrhea, unspecified: Secondary | ICD-10-CM | POA: Diagnosis not present

## 2021-12-18 DIAGNOSIS — C50411 Malignant neoplasm of upper-outer quadrant of right female breast: Secondary | ICD-10-CM | POA: Diagnosis not present

## 2021-12-18 DIAGNOSIS — Z5189 Encounter for other specified aftercare: Secondary | ICD-10-CM | POA: Diagnosis not present

## 2021-12-18 DIAGNOSIS — Z170 Estrogen receptor positive status [ER+]: Secondary | ICD-10-CM | POA: Diagnosis not present

## 2021-12-18 DIAGNOSIS — Z5112 Encounter for antineoplastic immunotherapy: Secondary | ICD-10-CM | POA: Diagnosis not present

## 2021-12-18 MED ORDER — PEGFILGRASTIM-CBQV 6 MG/0.6ML ~~LOC~~ SOSY
6.0000 mg | PREFILLED_SYRINGE | Freq: Once | SUBCUTANEOUS | Status: AC
  Administered 2021-12-18: 6 mg via SUBCUTANEOUS
  Filled 2021-12-18: qty 0.6

## 2021-12-21 ENCOUNTER — Telehealth: Payer: Self-pay | Admitting: *Deleted

## 2021-12-21 ENCOUNTER — Other Ambulatory Visit: Payer: Self-pay | Admitting: Radiology

## 2021-12-21 DIAGNOSIS — N6310 Unspecified lump in the right breast, unspecified quadrant: Secondary | ICD-10-CM | POA: Diagnosis not present

## 2021-12-21 DIAGNOSIS — R590 Localized enlarged lymph nodes: Secondary | ICD-10-CM | POA: Diagnosis not present

## 2021-12-21 DIAGNOSIS — R928 Other abnormal and inconclusive findings on diagnostic imaging of breast: Secondary | ICD-10-CM | POA: Diagnosis not present

## 2021-12-21 LAB — HM MAMMOGRAPHY

## 2021-12-21 NOTE — Telephone Encounter (Signed)
Received call from pt with complaint of shoulder pain and rash over port site.  Pt states she is unable to take ibuprofen, per MD pt to take OTC tylenol and apply a heating pad for shoulder discomfort.  RN also educated pt to apply OTC benadryl or hydrocortisone cream to the skin around the port as well as ask for a sensitive dressing when accessed.  Pt verbalized understanding.

## 2021-12-22 ENCOUNTER — Inpatient Hospital Stay: Payer: BC Managed Care – PPO | Admitting: Hematology and Oncology

## 2021-12-22 ENCOUNTER — Inpatient Hospital Stay: Payer: BC Managed Care – PPO

## 2021-12-22 NOTE — Assessment & Plan Note (Signed)
11/19/2021:Palpable lump for 3 weeks, diagnostic mammogram: 2.2 cm x 2.2 cm irregular mass with a spiculated margin in the upper outer right breast. 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 Neoadjuvant chemotherapy withTCHP foll by HP vs Kadcyla maintenance 2. Followed by breast conserving surgery with sentinel lymph node study vs targeted axillary dissection 3. Followed by adjuvant radiation therapy ------------------------------------------------------------------------------------------------------------------------------------ Current Treatment: Cycle 1 TCHP Chemo toxicities:  RTC in 2 weeks for cycle 2

## 2021-12-22 NOTE — Progress Notes (Signed)
Patient Care Team: [name], NP as PCP - General (Adult Health Nurse Practitioner) Rockwell Germany, RN as Oncology Nurse Navigator Mauro Kaufmann, RN as Oncology Nurse Navigator Coralie Keens, MD as Consulting Physician (General Surgery) Nicholas Lose, MD as Consulting Physician (Hematology and Oncology) Gery Pray, MD as Consulting Physician (Radiation Oncology)  DIAGNOSIS:    ICD-10-CM   1. Malignant neoplasm of upper-outer quadrant of right breast in female, estrogen receptor positive ([place])  C50.411    Z17.0       SUMMARY OF ONCOLOGIC HISTORY: Oncology History  Malignant neoplasm of upper-outer quadrant of right female breast ([place])  11/19/2021 Initial Diagnosis   Palpable lump for 3 weeks, diagnostic mammogram: 2.2 cm x 2.2 cm irregular mass with a spiculated margin in the upper outer right breast. 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 HER2 positive, Ki-67 15%   12/02/2021 Cancer Staging   Staging form: Breast, AJCC 8th Edition - Clinical stage from 12/02/2021: Stage IB (cT2, cN0, cM0, G2, ER+, PR+, HER2+) - Signed by Nicholas Lose, MD on 12/02/2021 Stage prefix: Initial diagnosis Histologic grading system: 3 grade system    12/16/2021 -  Chemotherapy   Patient is on Treatment Plan : BREAST  Docetaxel + Carboplatin + Trastuzumab + Pertuzumab  (TCHP) q21d        CHIEF COMPLIANT: Cycle 1 day 8 TCHP  INTERVAL HISTORY: Julie Atkinson is a 51 y.o. with above-mentioned history of right breast cancer, to start chemotherapy with TCHP. She presents to the clinic today for follow-up.  She received cycle 1 of TCHP chemotherapy last week and is here for toxicity evaluation.  She is experienced profound diarrhea almost up to 20 times per day since chemotherapy.  She has taken Imodium almost 3 tablets every day but never called [place] to tell us about how many times she has had diarrhea.  She also complaining of some metallic/battery acid taste in the mouth.  She has lost  about 14 pounds since we started her treatment.  This is partly because of not eating well as well as lack of taste and appetite along with diarrhea.  Today she is starting to feel slightly better.  She had bone pain for just 1 day.  ALLERGIES:  is allergic to codeine.  MEDICATIONS:  Current Outpatient Medications  Medication Sig Dispense Refill   clonazePAM (KLONOPIN) 0.5 MG disintegrating tablet Take 1 tablet (0.5 mg total) by mouth 2 (two) times daily. 30 tablet 0   dexamethasone (DECADRON) 4 MG tablet Take 1 tablet (4 mg total) by mouth daily. Take 1 tablet day before chemo and 1 tablet day after chemo with food 12 tablet 0   lidocaine-prilocaine (EMLA) cream Apply to affected area once 30 g 3   LORazepam (ATIVAN) 0.5 MG tablet Take 1 tablet (0.5 mg total) by mouth at bedtime as needed for anxiety. 30 tablet 0   ondansetron (ZOFRAN) 8 MG tablet Take 1 tablet (8 mg total) by mouth 2 (two) times daily as needed (Nausea or vomiting). Start on the third day after chemotherapy. 30 tablet 1   prochlorperazine (COMPAZINE) 10 MG tablet Take 1 tablet (10 mg total) by mouth every 6 (six) hours as needed (Nausea or vomiting). 30 tablet 1   traMADol (ULTRAM) 50 MG tablet Take 1 tablet (50 mg total) by mouth every 6 (six) hours as needed. 20 tablet 0   traZODone (DESYREL) 50 MG tablet Take 0.5-1 tablets (25-50 mg total) by mouth at bedtime as needed  for sleep. 30 tablet 3   No current facility-administered medications for this visit.    PHYSICAL EXAMINATION: ECOG PERFORMANCE STATUS: 1 - Symptomatic but completely ambulatory  Vitals:   12/23/21 1022  BP: (!) 157/84  Pulse: 76  Resp: 18  Temp: 97.8 F (36.6 C)  SpO2: 100%   Filed Weights   12/23/21 1022  Weight: 185 lb 3.2 oz (84 kg)     LABORATORY DATA:  I have reviewed the data as listed CMP Latest Ref Rng & Units 12/16/2021 12/02/2021 10/08/2021  Glucose 70 - 99 mg/dL 154(H) 105(H) 80  BUN 6 - 20 mg/dL 9 11 11   Creatinine 0.44 - 1.00  mg/dL 0.72 0.75 0.82  Sodium 135 - 145 mmol/L 134(L) 141 139  Potassium 3.5 - 5.1 mmol/L 3.7 4.1 4.3  Chloride 98 - 111 mmol/L 104 109 106  CO2 22 - 32 mmol/L 22 25 24   Calcium 8.9 - 10.3 mg/dL 9.0 9.1 9.6  Total Protein 6.5 - 8.1 g/dL 7.1 7.0 7.4  Total Bilirubin 0.3 - 1.2 mg/dL 0.4 0.4 0.4  Alkaline Phos 38 - 126 U/L 35(L) 41 39  AST 15 - 41 U/L 13(L) 30 11  ALT 0 - 44 U/L 11 24 9     Lab Results  Component Value Date   WBC 16.3 (H) 12/23/2021   HGB 13.8 12/23/2021   HCT 38.6 12/23/2021   MCV 93.9 12/23/2021   PLT 140 (L) 12/23/2021   NEUTROABS PENDING 12/23/2021    ASSESSMENT & PLAN:  Malignant neoplasm of upper-outer quadrant of right female breast ([place]) 11/19/2021:Palpable lump for 3 weeks, diagnostic mammogram: 2.2 cm x 2.2 cm irregular mass with a spiculated margin in the upper outer right breast. 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 HER2 positive, Ki-67 15% MRI Breast 12/09/21:  Right Breast retroareolar mass 6.7 cm (involves nipple areolar complex), Rt axilla 2.1 cm node, Left breast: Several scattered foci left breast indeterminate 0.6 cm mass LIQ left breast and another indeterminate 0.6 cm mass in upper central Left breast (biopsies recommended: rt axillary LN, Left Breast indeterminate mass)   Treatment Plan: 1. Neoadjuvant chemotherapy with TCHP foll by HP vs Kadcyla maintenance 2. Followed by breast conserving surgery with sentinel lymph node study vs targeted axillary dissection 3. Followed by adjuvant radiation therapy ------------------------------------------------------------------------------------------------------------------------------------ Current Treatment: Cycle 1 day 8 TCHP Chemo toxicities: Profound diarrhea: We will send a prescription for Lomotil.  If that does not work she will call us back so that we can add Questran if needed. In spite of the extensive diarrhea she has kept up her fluid intake and therefore she is not dehydrated  today. Mild nausea Severe fatigue  RTC in 2 weeks for cycle 2    No orders of the defined types were placed in this encounter.  The patient has a good understanding of the overall plan. she agrees with it. she will call with any problems that may develop before the next visit here.  Total time spent: 30 mins including face to face time and time spent for planning, charting and coordination of care  Rulon Eisenmenger, MD, MPH 12/23/2021  I, Thana Ates, am acting as scribe for Dr. Nicholas Lose.  I have reviewed the above documentation for accuracy and completeness, and I agree with the above.

## 2021-12-23 ENCOUNTER — Inpatient Hospital Stay (HOSPITAL_BASED_OUTPATIENT_CLINIC_OR_DEPARTMENT_OTHER): Payer: BC Managed Care – PPO | Admitting: Hematology and Oncology

## 2021-12-23 ENCOUNTER — Encounter: Payer: Self-pay | Admitting: *Deleted

## 2021-12-23 ENCOUNTER — Inpatient Hospital Stay: Payer: BC Managed Care – PPO

## 2021-12-23 ENCOUNTER — Other Ambulatory Visit: Payer: Self-pay

## 2021-12-23 DIAGNOSIS — Z170 Estrogen receptor positive status [ER+]: Secondary | ICD-10-CM

## 2021-12-23 DIAGNOSIS — Z79899 Other long term (current) drug therapy: Secondary | ICD-10-CM | POA: Diagnosis not present

## 2021-12-23 DIAGNOSIS — C50411 Malignant neoplasm of upper-outer quadrant of right female breast: Secondary | ICD-10-CM

## 2021-12-23 DIAGNOSIS — R197 Diarrhea, unspecified: Secondary | ICD-10-CM | POA: Diagnosis not present

## 2021-12-23 DIAGNOSIS — Z5112 Encounter for antineoplastic immunotherapy: Secondary | ICD-10-CM | POA: Diagnosis not present

## 2021-12-23 DIAGNOSIS — Z95828 Presence of other vascular implants and grafts: Secondary | ICD-10-CM

## 2021-12-23 DIAGNOSIS — Z5111 Encounter for antineoplastic chemotherapy: Secondary | ICD-10-CM | POA: Diagnosis not present

## 2021-12-23 DIAGNOSIS — Z5189 Encounter for other specified aftercare: Secondary | ICD-10-CM | POA: Diagnosis not present

## 2021-12-23 LAB — CBC WITH DIFFERENTIAL (CANCER CENTER ONLY)
Abs Immature Granulocytes: 1.35 10*3/uL — ABNORMAL HIGH (ref 0.00–0.07)
Basophils Absolute: 0.1 10*3/uL (ref 0.0–0.1)
Basophils Relative: 0 %
Eosinophils Absolute: 0.1 10*3/uL (ref 0.0–0.5)
Eosinophils Relative: 1 %
HCT: 38.6 % (ref 36.0–46.0)
Hemoglobin: 13.8 g/dL (ref 12.0–15.0)
Immature Granulocytes: 8 %
Lymphocytes Relative: 16 %
Lymphs Abs: 2.6 10*3/uL (ref 0.7–4.0)
MCH: 33.6 pg (ref 26.0–34.0)
MCHC: 35.8 g/dL (ref 30.0–36.0)
MCV: 93.9 fL (ref 80.0–100.0)
Monocytes Absolute: 3.4 10*3/uL — ABNORMAL HIGH (ref 0.1–1.0)
Monocytes Relative: 21 %
Neutro Abs: 8.9 10*3/uL — ABNORMAL HIGH (ref 1.7–7.7)
Neutrophils Relative %: 54 %
Platelet Count: 140 10*3/uL — ABNORMAL LOW (ref 150–400)
RBC: 4.11 MIL/uL (ref 3.87–5.11)
RDW: 11.4 % — ABNORMAL LOW (ref 11.5–15.5)
Smear Review: NORMAL
WBC Count: 16.3 10*3/uL — ABNORMAL HIGH (ref 4.0–10.5)
nRBC: 0 % (ref 0.0–0.2)

## 2021-12-23 LAB — CMP (CANCER CENTER ONLY)
ALT: 24 U/L (ref 0–44)
AST: 20 U/L (ref 15–41)
Albumin: 4.2 g/dL (ref 3.5–5.0)
Alkaline Phosphatase: 65 U/L (ref 38–126)
Anion gap: 9 (ref 5–15)
BUN: 9 mg/dL (ref 6–20)
CO2: 22 mmol/L (ref 22–32)
Calcium: 9.4 mg/dL (ref 8.9–10.3)
Chloride: 101 mmol/L (ref 98–111)
Creatinine: 0.77 mg/dL (ref 0.44–1.00)
GFR, Estimated: >60 mL/min (ref >60)
Glucose, Bld: 106 mg/dL — ABNORMAL HIGH (ref 70–99)
Potassium: 4.1 mmol/L (ref 3.5–5.1)
Sodium: 132 mmol/L — ABNORMAL LOW (ref 135–145)
Total Bilirubin: 0.4 mg/dL (ref 0.3–1.2)
Total Protein: 7.1 g/dL (ref 6.5–8.1)

## 2021-12-23 MED ORDER — DIPHENOXYLATE-ATROPINE 2.5-0.025 MG PO TABS: 1.0000 | ORAL_TABLET | Freq: Three times a day (TID) | ORAL | 3 refills | Status: DC | PRN

## 2021-12-23 MED ORDER — SODIUM CHLORIDE 0.9% FLUSH
10.0000 mL | Freq: Once | INTRAVENOUS | Status: AC
  Administered 2021-12-23: 10 mL

## 2021-12-23 MED ORDER — HEPARIN SOD (PORK) LOCK FLUSH 100 UNIT/ML IV SOLN
500.0000 [IU] | Freq: Once | INTRAVENOUS | Status: AC
  Administered 2021-12-23: 500 [IU]

## 2021-12-23 MED ORDER — LIDOCAINE-PRILOCAINE 2.5-2.5 % EX CREA: TOPICAL_CREAM | CUTANEOUS | 3 refills | Status: DC

## 2021-12-23 MED ORDER — FLUCONAZOLE 100 MG PO TABS: 100.0000 mg | ORAL_TABLET | Freq: Every day | ORAL | 1 refills | Status: DC

## 2021-12-24 ENCOUNTER — Encounter: Payer: Self-pay | Admitting: Hematology and Oncology

## 2021-12-31 ENCOUNTER — Telehealth: Payer: Self-pay

## 2021-12-31 NOTE — Telephone Encounter (Signed)
Notified patient of completion of FMLA paperwork. Fax transmission confirmation received. Copy of paperwork e-mailed to [email or web address] as requested by Patient. No other needs voiced at this time.

## 2022-01-03 ENCOUNTER — Other Ambulatory Visit: Payer: Self-pay | Admitting: Registered Nurse

## 2022-01-03 DIAGNOSIS — R4586 Emotional lability: Secondary | ICD-10-CM

## 2022-01-04 NOTE — Telephone Encounter (Signed)
Patient is requesting a refill of the following medications: Requested Prescriptions   Pending Prescriptions Disp Refills   FLUoxetine (PROZAC) 20 MG capsule [Pharmacy Med Name: FLUOXETINE 20MG  CAPSULES] 90 capsule 0    Sig: TAKE 1 CAPSULE(20 MG) BY MOUTH DAILY    Date of patient request: 01/03/2022 Last office visit: 10/21/2021 Date of last refill: 11/23/2021 Last refill amount: 90 capsules  Follow up time period per chart: 10/12/2022

## 2022-01-05 MED FILL — Dexamethasone Sodium Phosphate Inj 100 MG/10ML: INTRAMUSCULAR | Qty: 1 | Status: AC

## 2022-01-05 MED FILL — Fosaprepitant Dimeglumine For IV Infusion 150 MG (Base Eq): INTRAVENOUS | Qty: 5 | Status: AC

## 2022-01-05 NOTE — Progress Notes (Signed)
? ?Patient Care Team: ?[name], NP as PCP - General (Adult Health Nurse Practitioner) ?Rockwell Germany, RN as Oncology Nurse Navigator ?Mauro Kaufmann, RN as Oncology Nurse Navigator ?Coralie Keens, MD as Consulting Physician (General Surgery) ?Nicholas Lose, MD as Consulting Physician (Hematology and Oncology) ?Gery Pray, MD as Consulting Physician (Radiation Oncology) ? ?DIAGNOSIS:  ?  ICD-10-CM   ?1. Malignant neoplasm of upper-outer quadrant of right breast in female, estrogen receptor positive ([place])  C50.411   ? Z17.0   ?  ? ? ?SUMMARY OF ONCOLOGIC HISTORY: ?Oncology History  ?Malignant neoplasm of upper-outer quadrant of right female breast ([place])  ?11/19/2021 Initial Diagnosis  ? Palpable lump for 3 weeks, diagnostic mammogram: 2.2 cm x 2.2 cm irregular mass with a spiculated margin in the upper outer right breast. 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 HER2 positive, Ki-67 15% ?  ?12/02/2021 At[place] Staging  ? Staging form: Breast, AJCC 8th Edition ?- Clinical stage from 12/02/2021: Stage IB (cT2, cN0, cM0, G2, ER+, PR+, HER2+) - Signed by Nicholas Lose, MD on 12/02/2021 ?Stage prefix: Initial diagnosis ?Histologic grading system: 3 grade system ? ?  ?12/16/2021 -  Chemotherapy  ? Patient is on Treatment Plan : BREAST  Docetaxel + Carboplatin + Trastuzumab + Pertuzumab  ([place]) q21d   ?   ? ? ?CHIEF COMPLIANT: Cycle 2 Atkinson ? ?INTERVAL HISTORY: Julie Atkinson is a 51 y.o. with above-mentioned history of right breast Atkinson, Julie Atkinson. She presents to the clinic today for treatment.  She reports that thrush has resolved.  She is feeling significantly better.  Denies any fatigue or nausea or vomiting or fevers or chills. ? ?ALLERGIES:  is allergic to codeine. ? ?MEDICATIONS:  ?Current Outpatient Medications  ?Medication Sig Dispense Refill  ? FLUoxetine (PROZAC) 20 MG capsule TAKE 1 CAPSULE(20 MG) BY MOUTH DAILY 90 capsule 3  ? clonazePAM (KLONOPIN) 0.5 MG  disintegrating tablet Take 1 tablet (0.5 mg total) by mouth 2 (two) times daily. 30 tablet 0  ? dexamethasone (DECADRON) 4 MG tablet Take 1 tablet (4 mg total) by mouth daily. Take 1 tablet day before chemo and 1 tablet day after chemo with food 12 tablet 0  ? diphenoxylate-atropine (LOMOTIL) 2.5-0.025 MG tablet Take 1 tablet by mouth 3 (three) times daily as needed for diarrhea or loose stools. 60 tablet 3  ? fluconazole (DIFLUCAN) 100 MG tablet Take 1 tablet (100 mg total) by mouth daily. 7 tablet 1  ? lidocaine-prilocaine (EMLA) cream Apply to affected area once 30 g 3  ? LORazepam (ATIVAN) 0.5 MG tablet Take 1 tablet (0.5 mg total) by mouth at bedtime as needed for anxiety. 30 tablet 0  ? ondansetron (ZOFRAN) 8 MG tablet Take 1 tablet (8 mg total) by mouth 2 (two) times daily as needed (Nausea or vomiting). Start on the third day after chemotherapy. 30 tablet 1  ? prochlorperazine (COMPAZINE) 10 MG tablet Take 1 tablet (10 mg total) by mouth every 6 (six) hours as needed (Nausea or vomiting). 30 tablet 1  ? traMADol (ULTRAM) 50 MG tablet Take 1 tablet (50 mg total) by mouth every 6 (six) hours as needed. 20 tablet 0  ? traZODone (DESYREL) 50 MG tablet Take 0.5-1 tablets (25-50 mg total) by mouth at bedtime as needed for sleep. 30 tablet 3  ? ?No current facility-administered medications for this visit.  ? ?Facility-Administered Medications Ordered in Other Visits  ?Medication Dose Route Frequency Provider Last Rate Last Admin  ?  CARBOplatin (PARAPLATIN) 700 mg in sodium chloride 0.9 % 250 mL chemo infusion  700 mg Intravenous Once Nicholas Lose, MD      ? DOCEtaxel (TAXOTERE) 150 mg in sodium chloride 0.9 % 250 mL chemo infusion  75 mg/m2 (Treatment Plan Recorded) Intravenous Once Nicholas Lose, MD      ? heparin lock flush 100 unit/mL  500 Units Intracatheter Once PRN Nicholas Lose, MD      ? pertuzumab (PERJETA) 420 mg in sodium chloride 0.9 % 250 mL chemo infusion  420 mg Intravenous Once Nicholas Lose, MD       ? sodium chloride flush (NS) 0.9 % injection 10 mL  10 mL Intracatheter PRN Nicholas Lose, MD      ? trastuzumab-dkst (OGIVRI) 525 mg in sodium chloride 0.9 % 250 mL chemo infusion  6 mg/kg (Treatment Plan Recorded) Intravenous Once Nicholas Lose, MD      ? ? ?PHYSICAL EXAMINATION: ?ECOG PERFORMANCE STATUS: 1 - Symptomatic but completely ambulatory ? ?There were no vitals filed for this visit. ?There were no vitals filed for this visit. ? ?LABORATORY DATA:  ?I have reviewed the data as listed ?CMP Latest Ref Rng & Units 01/06/2022 12/23/2021 12/16/2021  ?Glucose 70 - 99 mg/dL 93 106(H) 154(H)  ?BUN 6 - 20 mg/dL 14 9 9   ?Creatinine 0.44 - 1.00 mg/dL 0.57 0.77 0.72  ?Sodium 135 - 145 mmol/L 138 132(L) 134(L)  ?Potassium 3.5 - 5.1 mmol/L 4.3 4.1 3.7  ?Chloride 98 - 111 mmol/L 108 101 104  ?CO2 22 - 32 mmol/L 25 22 22   ?Calcium 8.9 - 10.3 mg/dL 9.4 9.4 9.0  ?Total Protein 6.5 - 8.1 g/dL 6.9 7.1 7.1  ?Total Bilirubin 0.3 - 1.2 mg/dL 0.3 0.4 0.4  ?Alkaline Phos 38 - 126 U/L 61 65 35(L)  ?AST 15 - 41 U/L 12(L) 20 13(L)  ?ALT 0 - 44 U/L 17 24 11   ? ? ?Lab Results  ?Component Value Date  ? WBC 13.2 (H) 01/06/2022  ? HGB 12.1 01/06/2022  ? HCT 34.6 (L) 01/06/2022  ? MCV 95.3 01/06/2022  ? PLT 302 01/06/2022  ? NEUTROABS 11.1 (H) 01/06/2022  ? ? ?ASSESSMENT & PLAN:  ?Malignant neoplasm of upper-outer quadrant of right female breast ([place]) ?11/19/2021:Palpable lump for 3 weeks, diagnostic mammogram: 2.2 cm x 2.2 cm irregular mass with a spiculated margin in the upper outer right breast. 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 HER2 positive, Ki-67 15% ?MRI Breast 12/09/21:  Right Breast retroareolar mass 6.7 cm (involves nipple areolar complex), Rt axilla 2.1 cm node, Left breast: Several scattered foci left breast indeterminate 0.6 cm mass LIQ left breast and another indeterminate 0.6 cm mass in upper central Left breast (biopsies recommended: rt axillary LN, Left Breast indeterminate mass) ?  ?Treatment Plan: ?1. Neoadjuvant  chemotherapy with Atkinson foll by HP vs Kadcyla maintenance ?2. Followed by breast conserving surgery with sentinel lymph node study vs targeted axillary dissection ?3. Followed by adjuvant radiation therapy ?------------------------------------------------------------------------------------------------------------------------------------ ?Current Treatment: Cycle 2 At[place] ?Chemo toxicities: ?Profound diarrhea: Resolved with Lomotil.    ?In spite of the extensive diarrhea she has kept up her fluid intake and therefore she is not dehydrated today. ?Mild nausea ?Severe fatigue ?  ?RTC in 3 weeks for cycle 3 ? ? ? ?No orders of the defined types were placed in this encounter. ? ?The patient has a good understanding of the overall plan. she agrees with it. she will call with any problems that may develop before  the next visit here. ? ?Total time spent: 30 mins including face to face time and time spent for planning, charting and coordination of care ? ?Rulon Eisenmenger, MD, MPH ?01/06/2022 ? ?I, Thana Ates, am acting as scribe for Dr. Nicholas Lose. ? ?I have reviewed the above documentation for accuracy and completeness, and I agree with the above. ? ? ? ? ? ? ?

## 2022-01-05 NOTE — Assessment & Plan Note (Signed)
11/19/2021:Palpable lump for 3 weeks, diagnostic mammogram: 2.2 cm x 2.2 cm irregular mass with a spiculated margin in the upper outer right breast. 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 HER2 positive, Ki-67 15% MRI Breast 12/09/21: Right Breast retroareolar mass 6.7 cm (involves nipple areolar complex), Rt axilla 2.1 cm node, Left breast: Several scattered foci left breast indeterminate 0.6 cm mass LIQ left breast and another indeterminate 0.6 cm mass in upper central Left breast (biopsies recommended: rt axillary LN, Left Breast indeterminate mass)  Treatment Plan: 1. Neoadjuvant chemotherapy withTCHP foll by HP vs Kadcyla maintenance 2. Followed by breast conserving surgery with sentinel lymph node study vs targeted axillary dissection 3. Followed by adjuvant radiation therapy ------------------------------------------------------------------------------------------------------------------------------------ Current Treatment: Cycle 2 TCHP Chemo toxicities: 1. Profound diarrhea: We will send a prescription for Lomotil.  If that does not work she will call us back so that we can add Questran if needed. 2. In spite of the extensive diarrhea she has kept up her fluid intake and therefore she is not dehydrated today. 3. Mild nausea 4. Severe fatigue  RTC in 3 weeks for cycle 3

## 2022-01-06 ENCOUNTER — Other Ambulatory Visit: Payer: BC Managed Care – PPO

## 2022-01-06 ENCOUNTER — Inpatient Hospital Stay (HOSPITAL_BASED_OUTPATIENT_CLINIC_OR_DEPARTMENT_OTHER): Payer: BC Managed Care – PPO | Admitting: Hematology and Oncology

## 2022-01-06 ENCOUNTER — Ambulatory Visit: Payer: BC Managed Care – PPO

## 2022-01-06 ENCOUNTER — Other Ambulatory Visit: Payer: Self-pay

## 2022-01-06 ENCOUNTER — Ambulatory Visit: Payer: BC Managed Care – PPO | Admitting: Hematology and Oncology

## 2022-01-06 ENCOUNTER — Inpatient Hospital Stay: Payer: BC Managed Care – PPO

## 2022-01-06 ENCOUNTER — Inpatient Hospital Stay: Payer: BC Managed Care – PPO | Attending: Hematology and Oncology

## 2022-01-06 VITALS — BP 149/90 | HR 75 | Temp 99.1°F | Resp 18 | Wt 191.0 lb

## 2022-01-06 DIAGNOSIS — C50411 Malignant neoplasm of upper-outer quadrant of right female breast: Secondary | ICD-10-CM

## 2022-01-06 DIAGNOSIS — Z8041 Family history of malignant neoplasm of ovary: Secondary | ICD-10-CM | POA: Insufficient documentation

## 2022-01-06 DIAGNOSIS — Z5111 Encounter for antineoplastic chemotherapy: Secondary | ICD-10-CM | POA: Diagnosis not present

## 2022-01-06 DIAGNOSIS — Z170 Estrogen receptor positive status [ER+]: Secondary | ICD-10-CM | POA: Insufficient documentation

## 2022-01-06 DIAGNOSIS — Z5112 Encounter for antineoplastic immunotherapy: Secondary | ICD-10-CM | POA: Diagnosis not present

## 2022-01-06 DIAGNOSIS — Z8049 Family history of malignant neoplasm of other genital organs: Secondary | ICD-10-CM | POA: Insufficient documentation

## 2022-01-06 DIAGNOSIS — Z801 Family history of malignant neoplasm of trachea, bronchus and lung: Secondary | ICD-10-CM | POA: Diagnosis not present

## 2022-01-06 DIAGNOSIS — Z87891 Personal history of nicotine dependence: Secondary | ICD-10-CM | POA: Diagnosis not present

## 2022-01-06 DIAGNOSIS — Z803 Family history of malignant neoplasm of breast: Secondary | ICD-10-CM | POA: Diagnosis not present

## 2022-01-06 DIAGNOSIS — Z95828 Presence of other vascular implants and grafts: Secondary | ICD-10-CM

## 2022-01-06 DIAGNOSIS — Z5189 Encounter for other specified aftercare: Secondary | ICD-10-CM | POA: Diagnosis not present

## 2022-01-06 LAB — CMP (CANCER CENTER ONLY)
ALT: 17 U/L (ref 0–44)
AST: 12 U/L — ABNORMAL LOW (ref 15–41)
Albumin: 4.1 g/dL (ref 3.5–5.0)
Alkaline Phosphatase: 61 U/L (ref 38–126)
Anion gap: 5 (ref 5–15)
BUN: 14 mg/dL (ref 6–20)
CO2: 25 mmol/L (ref 22–32)
Calcium: 9.4 mg/dL (ref 8.9–10.3)
Chloride: 108 mmol/L (ref 98–111)
Creatinine: 0.57 mg/dL (ref 0.44–1.00)
GFR, Estimated: >60 mL/min (ref >60)
Glucose, Bld: 93 mg/dL (ref 70–99)
Potassium: 4.3 mmol/L (ref 3.5–5.1)
Sodium: 138 mmol/L (ref 135–145)
Total Bilirubin: 0.3 mg/dL (ref 0.3–1.2)
Total Protein: 6.9 g/dL (ref 6.5–8.1)

## 2022-01-06 LAB — CBC WITH DIFFERENTIAL (CANCER CENTER ONLY)
Abs Immature Granulocytes: 0.07 10*3/uL (ref 0.00–0.07)
Basophils Absolute: 0.1 10*3/uL (ref 0.0–0.1)
Basophils Relative: 0 %
Eosinophils Absolute: 0 10*3/uL (ref 0.0–0.5)
Eosinophils Relative: 0 %
HCT: 34.6 % — ABNORMAL LOW (ref 36.0–46.0)
Hemoglobin: 12.1 g/dL (ref 12.0–15.0)
Immature Granulocytes: 1 %
Lymphocytes Relative: 9 %
Lymphs Abs: 1.2 10*3/uL (ref 0.7–4.0)
MCH: 33.3 pg (ref 26.0–34.0)
MCHC: 35 g/dL (ref 30.0–36.0)
MCV: 95.3 fL (ref 80.0–100.0)
Monocytes Absolute: 0.7 10*3/uL (ref 0.1–1.0)
Monocytes Relative: 5 %
Neutro Abs: 11.1 10*3/uL — ABNORMAL HIGH (ref 1.7–7.7)
Neutrophils Relative %: 85 %
Platelet Count: 302 10*3/uL (ref 150–400)
RBC: 3.63 MIL/uL — ABNORMAL LOW (ref 3.87–5.11)
RDW: 12.1 % (ref 11.5–15.5)
WBC Count: 13.2 10*3/uL — ABNORMAL HIGH (ref 4.0–10.5)
nRBC: 0 % (ref 0.0–0.2)

## 2022-01-06 MED ORDER — PALONOSETRON HCL INJECTION 0.25 MG/5ML
0.2500 mg | Freq: Once | INTRAVENOUS | Status: AC
  Administered 2022-01-06: 0.25 mg via INTRAVENOUS
  Filled 2022-01-06: qty 5

## 2022-01-06 MED ORDER — ACETAMINOPHEN 325 MG PO TABS
650.0000 mg | ORAL_TABLET | Freq: Once | ORAL | Status: AC
  Administered 2022-01-06: 650 mg via ORAL
  Filled 2022-01-06: qty 2

## 2022-01-06 MED ORDER — DIPHENHYDRAMINE HCL 25 MG PO CAPS
50.0000 mg | ORAL_CAPSULE | Freq: Once | ORAL | Status: AC
  Administered 2022-01-06: 50 mg via ORAL
  Filled 2022-01-06: qty 2

## 2022-01-06 MED ORDER — TRASTUZUMAB-DKST CHEMO 150 MG IV SOLR
6.0000 mg/kg | Freq: Once | INTRAVENOUS | Status: AC
  Administered 2022-01-06: 525 mg via INTRAVENOUS
  Filled 2022-01-06: qty 25

## 2022-01-06 MED ORDER — SODIUM CHLORIDE 0.9 % IV SOLN: Freq: Once | INTRAVENOUS | Status: AC

## 2022-01-06 MED ORDER — SODIUM CHLORIDE 0.9 % IV SOLN
700.0000 mg | Freq: Once | INTRAVENOUS | Status: AC
  Administered 2022-01-06: 700 mg via INTRAVENOUS
  Filled 2022-01-06: qty 70

## 2022-01-06 MED ORDER — SODIUM CHLORIDE 0.9 % IV SOLN
150.0000 mg | Freq: Once | INTRAVENOUS | Status: AC
  Administered 2022-01-06: 150 mg via INTRAVENOUS
  Filled 2022-01-06: qty 150

## 2022-01-06 MED ORDER — HEPARIN SOD (PORK) LOCK FLUSH 100 UNIT/ML IV SOLN
500.0000 [IU] | Freq: Once | INTRAVENOUS | Status: AC | PRN
  Administered 2022-01-06: 500 [IU]

## 2022-01-06 MED ORDER — SODIUM CHLORIDE 0.9 % IV SOLN
75.0000 mg/m2 | Freq: Once | INTRAVENOUS | Status: AC
  Administered 2022-01-06: 150 mg via INTRAVENOUS
  Filled 2022-01-06: qty 15

## 2022-01-06 MED ORDER — SODIUM CHLORIDE 0.9% FLUSH
10.0000 mL | Freq: Once | INTRAVENOUS | Status: AC
  Administered 2022-01-06: 10 mL

## 2022-01-06 MED ORDER — SODIUM CHLORIDE 0.9% FLUSH
10.0000 mL | INTRAVENOUS | Status: DC | PRN
  Administered 2022-01-06: 10 mL

## 2022-01-06 MED ORDER — SODIUM CHLORIDE 0.9 % IV SOLN
10.0000 mg | Freq: Once | INTRAVENOUS | Status: AC
  Administered 2022-01-06: 10 mg via INTRAVENOUS
  Filled 2022-01-06: qty 10

## 2022-01-06 MED ORDER — SODIUM CHLORIDE 0.9 % IV SOLN
420.0000 mg | Freq: Once | INTRAVENOUS | Status: AC
  Administered 2022-01-06: 420 mg via INTRAVENOUS
  Filled 2022-01-06: qty 14

## 2022-01-06 NOTE — Progress Notes (Signed)
MD modified Carbo dose; cap at 700 mg. ? ?Kennith Center, Pharm.D., CPP ?01/06/2022@4 :22 PM ? ? ?

## 2022-01-07 ENCOUNTER — Other Ambulatory Visit: Payer: Self-pay | Admitting: Genetic Counselor

## 2022-01-07 DIAGNOSIS — C50411 Malignant neoplasm of upper-outer quadrant of right female breast: Secondary | ICD-10-CM

## 2022-01-08 ENCOUNTER — Other Ambulatory Visit: Payer: Self-pay

## 2022-01-08 ENCOUNTER — Inpatient Hospital Stay: Payer: BC Managed Care – PPO

## 2022-01-08 ENCOUNTER — Ambulatory Visit: Payer: BC Managed Care – PPO

## 2022-01-08 VITALS — BP 133/85 | HR 72 | Temp 99.2°F | Resp 16

## 2022-01-08 DIAGNOSIS — Z803 Family history of malignant neoplasm of breast: Secondary | ICD-10-CM | POA: Diagnosis not present

## 2022-01-08 DIAGNOSIS — Z8049 Family history of malignant neoplasm of other genital organs: Secondary | ICD-10-CM | POA: Diagnosis not present

## 2022-01-08 DIAGNOSIS — Z801 Family history of malignant neoplasm of trachea, bronchus and lung: Secondary | ICD-10-CM | POA: Diagnosis not present

## 2022-01-08 DIAGNOSIS — Z5189 Encounter for other specified aftercare: Secondary | ICD-10-CM | POA: Diagnosis not present

## 2022-01-08 DIAGNOSIS — Z170 Estrogen receptor positive status [ER+]: Secondary | ICD-10-CM | POA: Diagnosis not present

## 2022-01-08 DIAGNOSIS — C50411 Malignant neoplasm of upper-outer quadrant of right female breast: Secondary | ICD-10-CM | POA: Diagnosis not present

## 2022-01-08 DIAGNOSIS — Z5111 Encounter for antineoplastic chemotherapy: Secondary | ICD-10-CM | POA: Diagnosis not present

## 2022-01-08 DIAGNOSIS — Z5112 Encounter for antineoplastic immunotherapy: Secondary | ICD-10-CM | POA: Diagnosis not present

## 2022-01-08 DIAGNOSIS — Z87891 Personal history of nicotine dependence: Secondary | ICD-10-CM | POA: Diagnosis not present

## 2022-01-08 DIAGNOSIS — Z8041 Family history of malignant neoplasm of ovary: Secondary | ICD-10-CM | POA: Diagnosis not present

## 2022-01-08 MED ORDER — PEGFILGRASTIM-CBQV 6 MG/0.6ML ~~LOC~~ SOSY
6.0000 mg | PREFILLED_SYRINGE | Freq: Once | SUBCUTANEOUS | Status: AC
  Administered 2022-01-08: 6 mg via SUBCUTANEOUS
  Filled 2022-01-08: qty 0.6

## 2022-01-08 NOTE — Patient Instructions (Signed)

## 2022-01-11 ENCOUNTER — Encounter: Payer: Self-pay | Admitting: Hematology and Oncology

## 2022-01-12 ENCOUNTER — Encounter: Payer: Self-pay | Admitting: Adult Health

## 2022-01-12 ENCOUNTER — Telehealth: Payer: Self-pay

## 2022-01-12 ENCOUNTER — Telehealth: Payer: Self-pay | Admitting: *Deleted

## 2022-01-12 ENCOUNTER — Other Ambulatory Visit: Payer: Self-pay

## 2022-01-12 ENCOUNTER — Inpatient Hospital Stay (HOSPITAL_BASED_OUTPATIENT_CLINIC_OR_DEPARTMENT_OTHER): Payer: BC Managed Care – PPO | Admitting: Adult Health

## 2022-01-12 ENCOUNTER — Other Ambulatory Visit: Payer: Self-pay | Admitting: Hematology and Oncology

## 2022-01-12 ENCOUNTER — Other Ambulatory Visit: Payer: Self-pay | Admitting: *Deleted

## 2022-01-12 ENCOUNTER — Inpatient Hospital Stay: Payer: BC Managed Care – PPO

## 2022-01-12 VITALS — BP 155/97 | HR 112 | Temp 97.7°F | Resp 18 | Ht 68.0 in | Wt 184.9 lb

## 2022-01-12 DIAGNOSIS — C50411 Malignant neoplasm of upper-outer quadrant of right female breast: Secondary | ICD-10-CM

## 2022-01-12 DIAGNOSIS — Z170 Estrogen receptor positive status [ER+]: Secondary | ICD-10-CM

## 2022-01-12 DIAGNOSIS — Z803 Family history of malignant neoplasm of breast: Secondary | ICD-10-CM | POA: Diagnosis not present

## 2022-01-12 DIAGNOSIS — Z8041 Family history of malignant neoplasm of ovary: Secondary | ICD-10-CM | POA: Diagnosis not present

## 2022-01-12 DIAGNOSIS — Z8049 Family history of malignant neoplasm of other genital organs: Secondary | ICD-10-CM | POA: Diagnosis not present

## 2022-01-12 DIAGNOSIS — Z87891 Personal history of nicotine dependence: Secondary | ICD-10-CM | POA: Diagnosis not present

## 2022-01-12 DIAGNOSIS — Z801 Family history of malignant neoplasm of trachea, bronchus and lung: Secondary | ICD-10-CM | POA: Diagnosis not present

## 2022-01-12 DIAGNOSIS — Z5189 Encounter for other specified aftercare: Secondary | ICD-10-CM | POA: Diagnosis not present

## 2022-01-12 DIAGNOSIS — Z5112 Encounter for antineoplastic immunotherapy: Secondary | ICD-10-CM | POA: Diagnosis not present

## 2022-01-12 DIAGNOSIS — Z5111 Encounter for antineoplastic chemotherapy: Secondary | ICD-10-CM | POA: Diagnosis not present

## 2022-01-12 LAB — CBC WITH DIFFERENTIAL (CANCER CENTER ONLY)
Abs Immature Granulocytes: 0.02 10*3/uL (ref 0.00–0.07)
Basophils Absolute: 0 10*3/uL (ref 0.0–0.1)
Basophils Relative: 1 %
Eosinophils Absolute: 0.2 10*3/uL (ref 0.0–0.5)
Eosinophils Relative: 4 %
HCT: 37.6 % (ref 36.0–46.0)
Hemoglobin: 12.9 g/dL (ref 12.0–15.0)
Immature Granulocytes: 0 %
Lymphocytes Relative: 39 %
Lymphs Abs: 1.9 10*3/uL (ref 0.7–4.0)
MCH: 32.4 pg (ref 26.0–34.0)
MCHC: 34.3 g/dL (ref 30.0–36.0)
MCV: 94.5 fL (ref 80.0–100.0)
Monocytes Absolute: 0.5 10*3/uL (ref 0.1–1.0)
Monocytes Relative: 11 %
Neutro Abs: 2.2 10*3/uL (ref 1.7–7.7)
Neutrophils Relative %: 45 %
Platelet Count: 149 10*3/uL — ABNORMAL LOW (ref 150–400)
RBC: 3.98 MIL/uL (ref 3.87–5.11)
RDW: 11.9 % (ref 11.5–15.5)
WBC Count: 4.9 10*3/uL (ref 4.0–10.5)
nRBC: 0 % (ref 0.0–0.2)

## 2022-01-12 LAB — CMP (CANCER CENTER ONLY)
ALT: 24 U/L (ref 0–44)
AST: 18 U/L (ref 15–41)
Albumin: 4.3 g/dL (ref 3.5–5.0)
Alkaline Phosphatase: 68 U/L (ref 38–126)
Anion gap: 7 (ref 5–15)
BUN: 7 mg/dL (ref 6–20)
CO2: 25 mmol/L (ref 22–32)
Calcium: 9.8 mg/dL (ref 8.9–10.3)
Chloride: 101 mmol/L (ref 98–111)
Creatinine: 0.59 mg/dL (ref 0.44–1.00)
GFR, Estimated: >60 mL/min (ref >60)
Glucose, Bld: 91 mg/dL (ref 70–99)
Potassium: 4.2 mmol/L (ref 3.5–5.1)
Sodium: 133 mmol/L — ABNORMAL LOW (ref 135–145)
Total Bilirubin: 0.6 mg/dL (ref 0.3–1.2)
Total Protein: 7.3 g/dL (ref 6.5–8.1)

## 2022-01-12 LAB — APTT: aPTT: 29 seconds (ref 24–36)

## 2022-01-12 LAB — GENETIC SCREENING ORDER

## 2022-01-12 LAB — PROTIME-INR
INR: 1 (ref 0.8–1.2)
Prothrombin Time: 13.1 seconds (ref 11.4–15.2)

## 2022-01-12 MED ORDER — AMOXICILLIN-POT CLAVULANATE 875-125 MG PO TABS: 1.0000 | ORAL_TABLET | Freq: Two times a day (BID) | ORAL | 0 refills | Status: DC

## 2022-01-12 NOTE — Progress Notes (Unsigned)
u

## 2022-01-12 NOTE — Progress Notes (Signed)
[facility] Follow up:    Julie Coss, NP [address] [place]  [id number]   DIAGNOSIS:  Cancer Staging  Malignant neoplasm of upper-outer quadrant of right female breast [facility]) Staging form: Breast, AJCC 8th Edition - Clinical stage from 12/02/2021: Stage IB (cT2, cN0, cM0, G2, ER+, PR+, HER2+) - Signed by Nicholas Lose, MD on 12/02/2021 Stage prefix: Initial diagnosis Histologic grading system: 3 grade system   SUMMARY OF ONCOLOGIC HISTORY: Oncology History  Malignant neoplasm of upper-outer quadrant of right female breast ([place])  11/19/2021 Initial Diagnosis   Palpable lump for 3 weeks, diagnostic mammogram: 2.2 cm x 2.2 cm irregular mass with a spiculated margin in the upper outer right breast. 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 HER2 positive, Ki-67 15%   12/02/2021 Cancer Staging   Staging form: Breast, AJCC 8th Edition - Clinical stage from 12/02/2021: Stage IB (cT2, cN0, cM0, G2, ER+, PR+, HER2+) - Signed by Nicholas Lose, MD on 12/02/2021 Stage prefix: Initial diagnosis Histologic grading system: 3 grade system    12/16/2021 -  Chemotherapy   Patient is on Treatment Plan : BREAST  Docetaxel + Carboplatin + Trastuzumab + Pertuzumab  (TCHP) q21d        CURRENT THERAPY:TCHP cycle 2 day 6  INTERVAL HISTORY: Julie Atkinson 51 y.o. female returns for follow-up of increased left breast swelling.  This is not the breast that has breast cancer and it rather the breast that had a biopsy issue 4 weeks ago when she underwent MRI guided biopsy.  She notes that after the MRI guided biopsy she developed a lot of bleeding and bruising all over the breast.  She says that that area had since resolved however yesterday she noticed increased tenderness to the area.  She has had increased swelling and says that she had a fever overnight.  She is very worried that she may be bleeding into her breast.  She denies any dizziness or lightheadedness.   Patient  Active Problem List   Diagnosis Date Noted   Port-A-Cath in place 12/16/2021   Family history of breast cancer 12/04/2021   Family history of ovarian cancer 12/04/2021   Genetic testing 12/04/2021   Malignant neoplasm of upper-outer quadrant of right female breast ([place]) 12/01/2021   Breast lump on right side at 10 o'clock position 10/21/2021   PTSD (post-traumatic stress disorder)     is allergic to codeine.  MEDICAL HISTORY: Past Medical History:  Diagnosis Date   Depression    Family history of breast cancer 12/04/2021   Family history of ovarian cancer 12/04/2021   Fibroid 2014/01/19   8 mm   History of posttraumatic stress disorder (PTSD) Jan 20, 2008   due to maternal death-MI   PTSD (post-traumatic stress disorder)    mother's death 01-20-08; seizure and AMI while driving, patient performed CPR x 45 minutes   Seropositive for herpes simplex 2 infection 2016-01-20    SURGICAL HISTORY: Past Surgical History:  Procedure Laterality Date   BLADDER SURGERY  1978   nocturnal enuresis treatment   CESAREAN SECTION  01-19-2006   Dr Quincy Simmonds   DILATATION & CURETTAGE/HYSTEROSCOPY WITH MYOSURE N/A 01/06/2016   Procedure: DILATATION & CURETTAGE/HYSTEROSCOPY WITH MYOSURE;  Surgeon: Nunzio Cobbs, MD;  Location: [facility];  Service: Gynecology;  Laterality: N/A;   DILATION AND CURETTAGE OF UTERUS  1990   MAB?   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   Lives with her son.   Lost her job suddenly 08/20/2016 with job cuts at work.   Social Determinants of Health   Financial Resource Strain: Not on file  Food Insecurity: Not on file  Transportation Needs: Not on file  Physical Activity: Not on file  Stress: Not on file  Social Connections: Not on file  Intimate Partner Violence: Not on file    FAMILY HISTORY: Family History  Problem Relation Age of Onset   Pulmonary disease Mother    Heart disease Mother    Cervical cancer Mother 28   Breast cancer Maternal Aunt        dx late 29s   Lung cancer Maternal Uncle        d. 31   Bone cancer Paternal Grandmother        primary? limited info; d. 82s   Breast cancer Cousin 26       maternal female cousin   Ovarian cancer Cousin        maternal cousin; d. 38   Cancer Other        cervical or other GYN cancer in several maternal relatives    Review of Systems  Constitutional:  Positive for fatigue and fever. Negative for appetite change, chills and unexpected weight change.  HENT:   Negative for hearing loss, lump/mass and trouble swallowing.   Eyes:  Negative for eye problems and icterus.  Respiratory:  Negative for chest tightness, cough and shortness of breath.   Cardiovascular:  Negative for chest pain, leg swelling and palpitations.  Gastrointestinal:  Negative for abdominal distention, abdominal pain, constipation, diarrhea, nausea and vomiting.  Endocrine: Negative for hot flashes.  Genitourinary:  Negative for difficulty urinating.   Musculoskeletal:  Negative for arthralgias.  Skin:   Negative for itching and rash.  Neurological:  Negative for dizziness, extremity weakness, headaches and numbness.  Hematological:  Negative for adenopathy. Does not bruise/bleed easily.  Psychiatric/Behavioral:  Negative for depression. The patient is not nervous/anxious.      PHYSICAL EXAMINATION  ECOG PERFORMANCE STATUS: 1 - Symptomatic but completely ambulatory  Vitals:   01/12/22 1112  BP: (!) 155/97  Pulse: (!) 112  Resp: 18  Temp: 97.7 F (36.5 C)  SpO2: 98%    Physical Exam Constitutional:      General: She is not in acute distress.    Appearance: Normal appearance. She is not toxic-appearing.  HENT:     Head: Normocephalic and atraumatic.  Eyes:     General: No scleral icterus. Cardiovascular:     Rate and Rhythm: Normal rate and regular rhythm.     Pulses: Normal pulses.     Heart sounds: Normal heart sounds.  Pulmonary:     Effort: Pulmonary effort is normal.     Breath sounds: Normal breath sounds.  Abdominal:     General: Abdomen is flat. Bowel sounds are normal. There is no distension.     Palpations: Abdomen is soft.     Tenderness: There is no abdominal tenderness.  Musculoskeletal:        General: No swelling.     Cervical back: Neck supple.  Lymphadenopathy:     Cervical: No cervical adenopathy.  Skin:    General: Skin is warm and dry.     Findings: No rash.  Neurological:     General: No focal deficit present.     Mental Status: She is alert.  Psychiatric:        Mood and Affect: Mood normal.        Behavior: Behavior normal.    LABORATORY DATA:  CBC    Component Value Date/Time   WBC 13.2 (H) 01/06/2022 1115   WBC 7.9 10/08/2021 1519   RBC 3.63 (L) 01/06/2022 1115   HGB 12.1 01/06/2022 1115   HGB 13.4 04/19/2019 0826   HGB 13.4 07/14/2016 1220   HCT 34.6 (L) 01/06/2022 1115   HCT 39.5 04/19/2019 0826   PLT 302 01/06/2022 1115   PLT 195 04/19/2019 0826   MCV 95.3 01/06/2022 1115   MCV 95 04/19/2019 0826   MCH 33.3 01/06/2022  1115   MCHC 35.0 01/06/2022 1115   RDW 12.1 01/06/2022 1115   RDW 11.7 04/19/2019 0826   LYMPHSABS 1.2 01/06/2022 1115   LYMPHSABS 2.6 04/19/2019 0826   MONOABS 0.7 01/06/2022 1115   EOSABS 0.0 01/06/2022 1115   EOSABS 0.2 04/19/2019 0826   BASOSABS 0.1 01/06/2022 1115   BASOSABS 0.1 04/19/2019 0826    CMP     Component Value Date/Time   NA 138 01/06/2022 1115   NA 136 04/19/2019 0826   K 4.3 01/06/2022 1115   CL 108 01/06/2022 1115   CO2 25 01/06/2022 1115   GLUCOSE 93 01/06/2022 1115   BUN 14 01/06/2022 1115   BUN 11 04/19/2019 0826   CREATININE 0.57 01/06/2022 1115   CREATININE 0.76 07/14/2016 1221   CALCIUM 9.4 01/06/2022 1115   PROT 6.9 01/06/2022 1115   PROT 6.7 04/19/2019 0826   ALBUMIN 4.1 01/06/2022 1115   ALBUMIN 4.2 04/19/2019 0826   AST 12 (L) 01/06/2022 1115   ALT 17 01/06/2022 1115   ALKPHOS 61 01/06/2022 1115   BILITOT 0.3 01/06/2022 1115   GFRNONAA >60 01/06/2022 1115   GFRAA 107 04/19/2019 0826      ASSESSMENT and THERAPY PLAN:   Malignant neoplasm of upper-outer quadrant of right female breast ([facility]) 11/19/2021:Palpable lump for 3 weeks, diagnostic mammogram: 2.2 cm x 2.2 cm irregular mass with a spiculated margin in the upper outer right breast. 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 HER2 positive, Ki-67 15% MRI Breast 12/09/21:  Right Breast retroareolar mass 6.7 cm (involves nipple areolar complex), Rt axilla 2.1 cm node, Left breast: Several scattered foci left breast indeterminate 0.6 cm mass LIQ left breast and another indeterminate 0.6 cm mass in upper central Left breast (biopsies recommended: rt axillary LN, Left Breast indeterminate mass)   Treatment Plan: 1. Neoadjuvant chemotherapy with TCHP foll by  HP vs Kadcyla maintenance 2. Followed by breast conserving surgery with sentinel lymph node study vs targeted axillary dissection 3. Followed by adjuvant radiation  therapy ------------------------------------------------------------------------------------------------------------------------------------ Current Treatment: Cycle 2 TCHP  Diane is having increased left breast swelling and tenderness.  We are going to do the following. 1.  We are going to get a CBC and c-Met today since she did receive chemo.  She asked about test about easy bruising or bleeding and due to this we will also get PT/INR and PTT. 2.  I am going to send in Augmentin twice daily for her to take. 3.  I have ordered an ultrasound to evaluate the breast.    Should she get worse, I recommended that she go to the emergency room.     Orders Placed This Encounter  Procedures   US BREAST LTD UNI LEFT INC AXILLA    Standing Status:   Future    Standing Expiration Date:   01/13/2023    Order Specific Question:   Reason for Exam (SYMPTOM  OR DIAGNOSIS REQUIRED)    Answer:   left breast swelling, please evaluate    Order Specific Question:   Preferred imaging location?    Answer:   [facility] only)    Standing Status:   Future    Number of Occurrences:   1    Standing Expiration Date:   01/13/2023   CBC with Differential (Cancer Center Only)    Standing Status:   Future    Number of Occurrences:   1    Standing Expiration Date:   01/13/2023   Protime-INR    Standing Status:   Future    Number of Occurrences:   1    Standing Expiration Date:   01/13/2023   APTT    Standing Status:   Future    Number of Occurrences:   1    Standing Expiration Date:   01/12/2023    All questions were answered. The patient knows to call the clinic with any problems, questions or concerns. We can certainly see the patient much sooner if necessary.  Total encounter time: 20 minutes in face-to-face visit time, chart review, lab review, care coordination, order entry, and documentation of the encounter.  Wilber Bihari, NP 01/12/22 12:24 PM Medical Oncology and Hematology [facility] [place], [place] [address] Tel. [phone]    Fax. [phone]  *Total Encounter Time as defined by the Centers for Medicare and Medicaid Services includes, in addition to the face-to-face time of a patient visit (documented in the note above) non-face-to-face time: obtaining and reviewing outside history, ordering and reviewing medications, tests or procedures, care coordination (communications with other health care professionals or caregivers) and documentation in the medical record.

## 2022-01-12 NOTE — Telephone Encounter (Signed)
Called Dr [facility] office and LVM for nurse, Jeani Hawking to make aware that pt is in need of STAT unl left br [place] d/t breast swelling, discoloration, discomfort. Pt states Dr [facility] nurse called her and was able to schedule her for tomorrow at 2:15.  ?

## 2022-01-12 NOTE — Assessment & Plan Note (Signed)
11/19/2021:Palpable lump for 3 weeks, diagnostic mammogram: 2.2 cm x 2.2 cm irregular mass with a spiculated margin in the upper outer right breast. 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 HER2 positive, Ki-67 15% ?MRI Breast 12/09/21: ?Right Breast retroareolar mass 6.7 cm (involves nipple areolar complex), Rt axilla 2.1 cm node, Left breast: Several scattered foci left breast indeterminate 0.6 cm mass LIQ left breast and another indeterminate 0.6 cm mass in upper central Left breast (biopsies recommended: rt axillary LN, Left Breast indeterminate mass) ?? ?Treatment Plan: ?1. Neoadjuvant chemotherapy with?TCHP foll by HP vs Kadcyla maintenance ?2. Followed by breast conserving surgery with sentinel lymph node study vs targeted axillary dissection ?3. Followed by adjuvant radiation therapy ?------------------------------------------------------------------------------------------------------------------------------------ ?Current Treatment: Cycle 2 TCHP ? ?Julie Atkinson is having increased left breast swelling and tenderness.  We are going to do the following. ?1.  We are going to get a CBC and c-Met today since she did receive chemo.  She asked about test about easy bruising or bleeding and due to this we will also get PT/INR and PTT. ?2.  I am going to send in Augmentin twice daily for her to take. ?3.  I have ordered an ultrasound to evaluate the breast.   ? ?Should she get worse, I recommended that she go to the emergency room. ?  ?

## 2022-01-12 NOTE — Telephone Encounter (Signed)
Received VM message from patient stating she was having some breast swelling.  Returned patient's call and she states she already has an appt today to be evaluated.  Encouraged her to call should anything else arise. Patient verbalized understanding.  ?

## 2022-01-13 ENCOUNTER — Other Ambulatory Visit: Payer: Self-pay | Admitting: Adult Health

## 2022-01-13 ENCOUNTER — Ambulatory Visit
Admission: RE | Admit: 2022-01-13 | Discharge: 2022-01-13 | Disposition: A | Discharge status: Home / Self Care | Payer: BC Managed Care – PPO | Source: Ambulatory Visit | Attending: Adult Health | Admitting: Adult Health

## 2022-01-13 ENCOUNTER — Encounter: Payer: Self-pay | Admitting: Hematology and Oncology

## 2022-01-13 ENCOUNTER — Encounter: Payer: Self-pay | Admitting: Adult Health

## 2022-01-13 DIAGNOSIS — C50411 Malignant neoplasm of upper-outer quadrant of right female breast: Secondary | ICD-10-CM

## 2022-01-13 DIAGNOSIS — Z170 Estrogen receptor positive status [ER+]: Secondary | ICD-10-CM

## 2022-01-13 DIAGNOSIS — N644 Mastodynia: Secondary | ICD-10-CM | POA: Diagnosis not present

## 2022-01-13 IMAGING — US US BREAST*L* LIMITED INC AXILLA
1 series · 13 of 16 positions shown · non-contrast
Comparison: Previous exam(s).

CLINICAL DATA: 50-year-old female with history of benign MR guided
LEFT breast biopsy on [DATE] with subsequent LEFT breast
hematoma at that time. Patient experienced a sudden pain in the LEFT
breast with firmness and dark bruising overlying the INNER LEFT
breast 2 days ago. Patient is currently undergoing neoadjuvant
therapy for RIGHT breast cancer.

EXAM:
ULTRASOUND OF THE LEFT BREAST

[Series 1: us breast*left* limited inc axilla · 0.08mm/px · 13 of 16 slices shown]
[im 1/16]
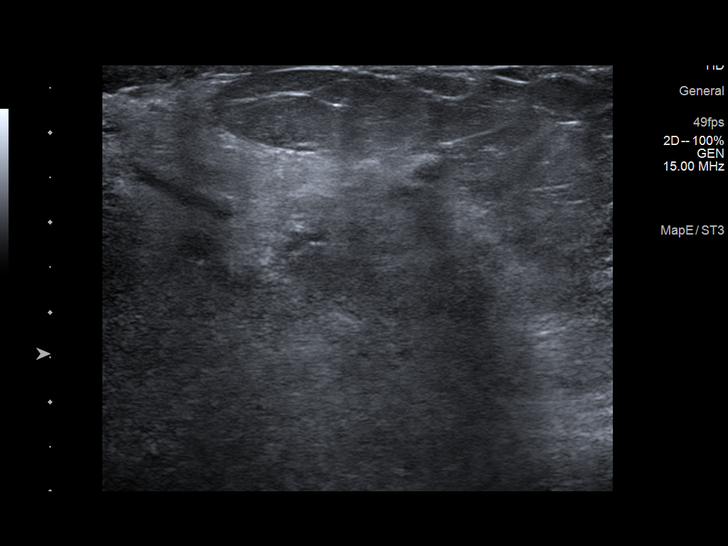
[im 2/16]
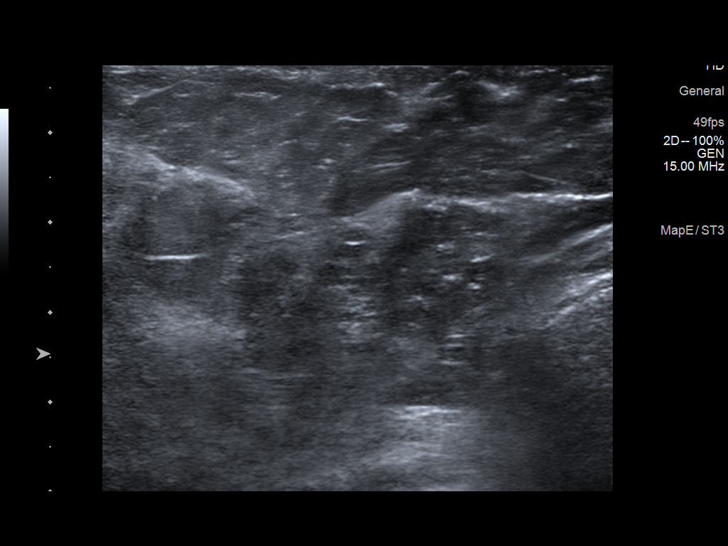
[im 4/16]
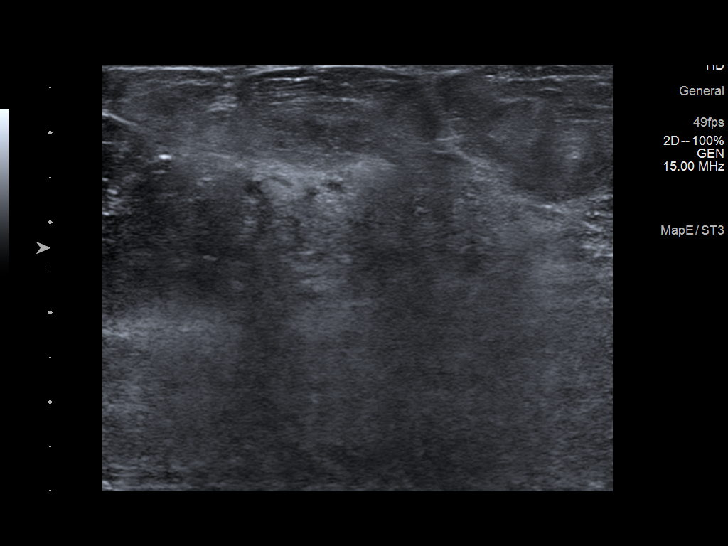
[im 5/16]
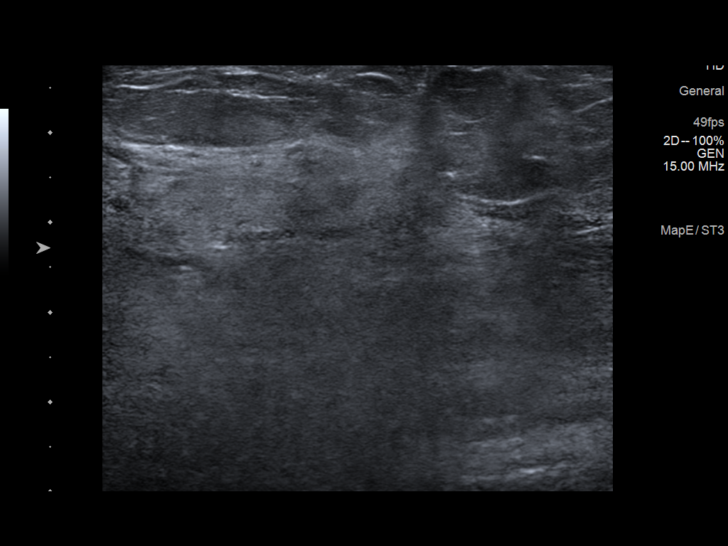
[im 6/16]
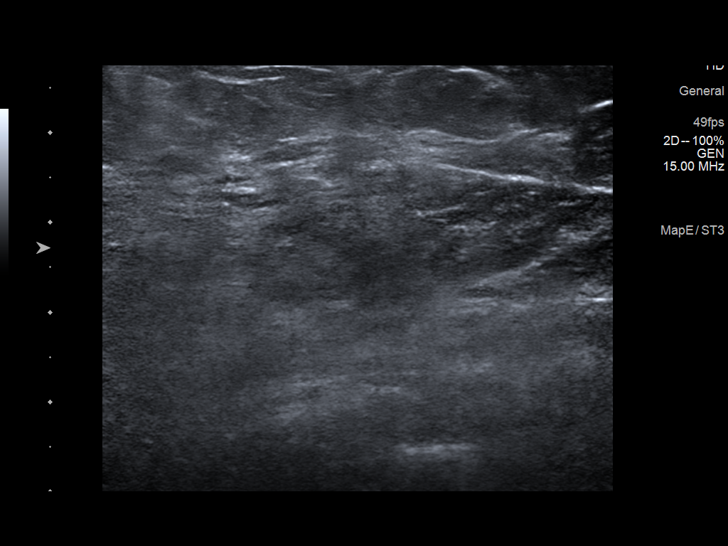
[im 7/16]
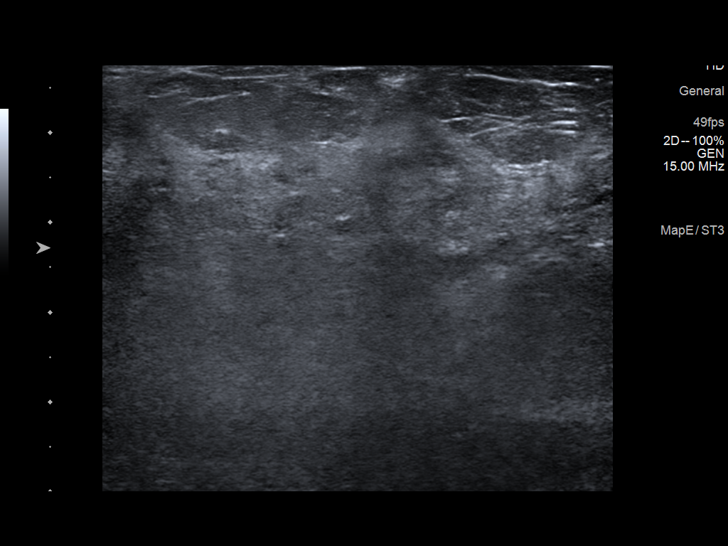
[im 9/16]
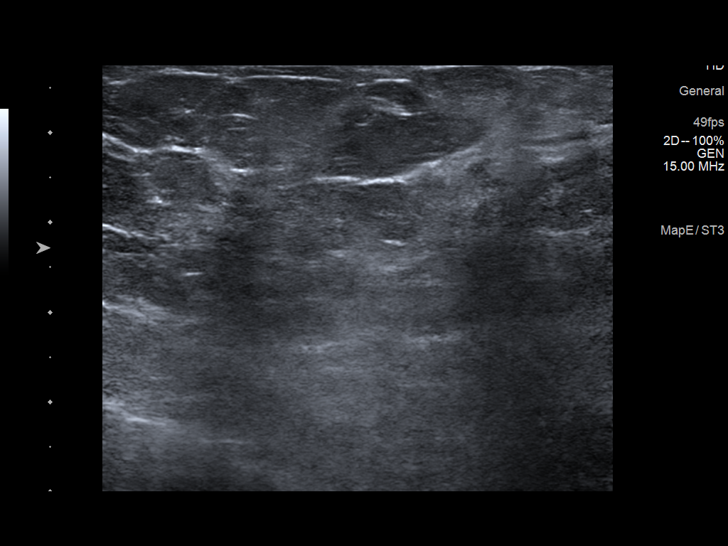
[im 10/16]
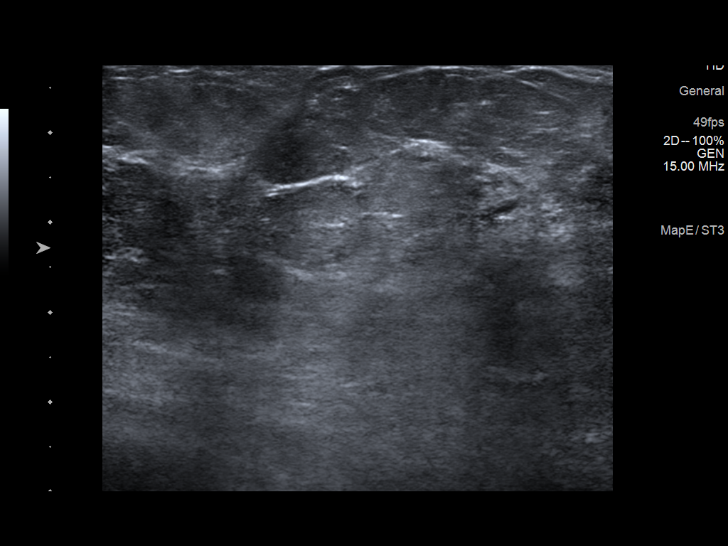
[im 11/16]
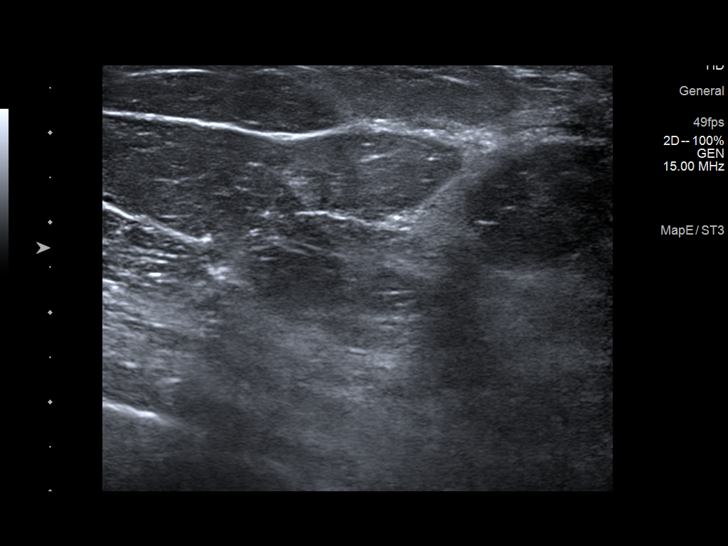
[im 12/16]
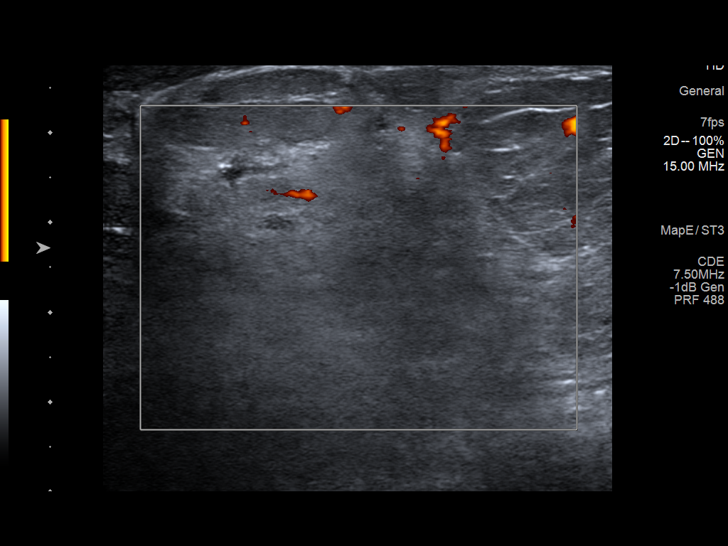
[im 13/16]
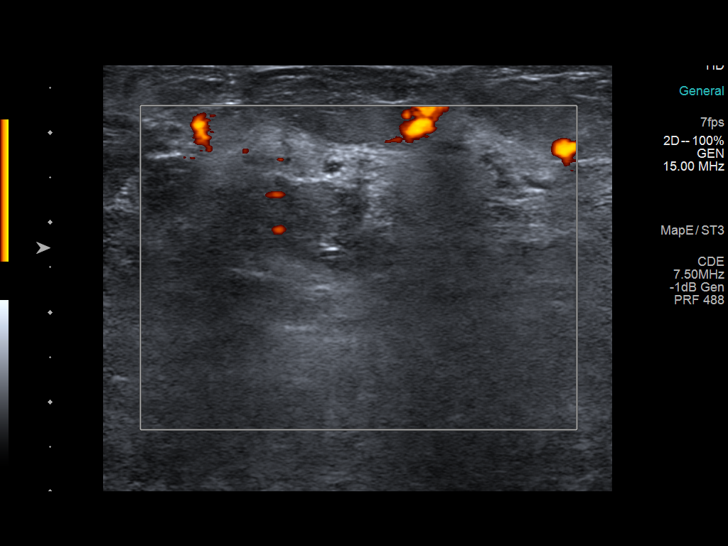
[im 15/16]
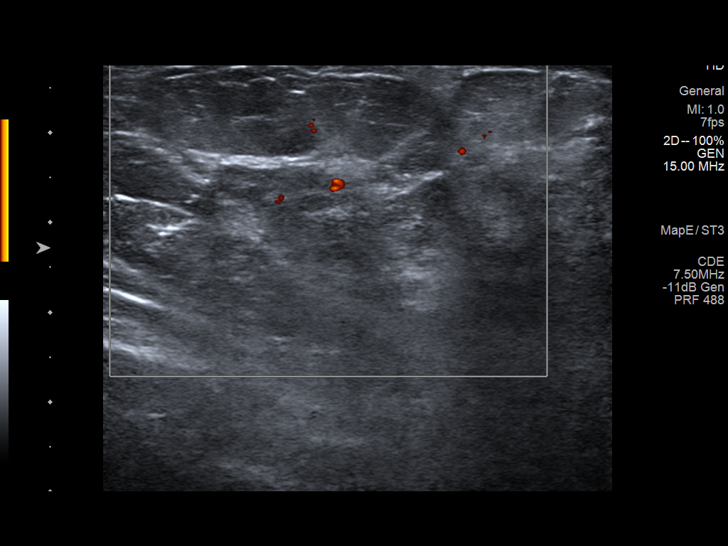
[im 16/16]
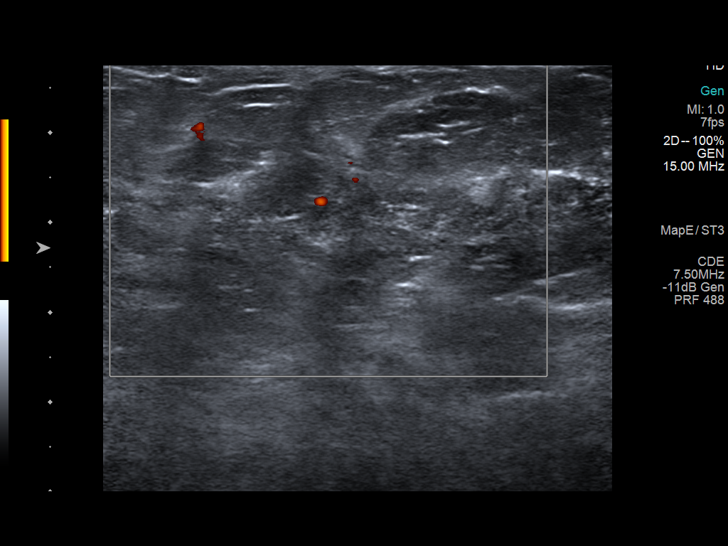

[13 of 16 positions shown; findings below may reference images not displayed]

FINDINGS: On physical exam, ecchymosis overlying the INNER LEFT breast
identified which has a darker/subacute appearance.

Targeted ultrasound is performed, showing no sonographic abnormality
or collection in the INNER or RETROAREOLAR LEFT breast.
IMPRESSION: 1. Ecchymosis overlying the INNER LEFT breast without sonographic
abnormality in this area or in the RETROAREOLAR LEFT breast. Given
patient history, this likely represented a liquefied hematoma in the
area of prior LEFT breast biopsy that ruptured with subsequent
ecchymosis.

RECOMMENDATION:
Consider clinical follow-up as indicated. Any further workup should
be based on clinical grounds.

Treatment plan for known RIGHT breast cancer.

I have discussed the findings and recommendations with the patient.
If applicable, a reminder letter will be sent to the patient
regarding the next appointment.

BI-RADS CATEGORY  2: Benign.

## 2022-01-14 ENCOUNTER — Telehealth: Payer: Self-pay

## 2022-01-14 NOTE — Telephone Encounter (Signed)
Called pt to let her know per NP to stop abx. Pt verbalized understanding. Asked pt if radiologist reviewed results with her and she states he did and she has an overall understanding of results.  ?

## 2022-01-14 NOTE — Telephone Encounter (Signed)
-----   Message from Gardenia Phlegm, NP sent at 01/13/2022 11:11 PM EST ----- ?Please let patient know she can stop taking antibiotics ? ?----- Message ----- ?From: Interface, Rad Results In ?Sent: 01/13/2022   5:44 PM EST ?To: Gardenia Phlegm, NP ? ? ?

## 2022-01-25 ENCOUNTER — Telehealth: Payer: Self-pay | Admitting: Genetic Counselor

## 2022-01-25 ENCOUNTER — Ambulatory Visit: Payer: Self-pay | Admitting: Genetic Counselor

## 2022-01-25 DIAGNOSIS — Z8041 Family history of malignant neoplasm of ovary: Secondary | ICD-10-CM

## 2022-01-25 DIAGNOSIS — Z1379 Encounter for other screening for genetic and chromosomal anomalies: Secondary | ICD-10-CM

## 2022-01-25 DIAGNOSIS — Z803 Family history of malignant neoplasm of breast: Secondary | ICD-10-CM

## 2022-01-25 DIAGNOSIS — C50411 Malignant neoplasm of upper-outer quadrant of right female breast: Secondary | ICD-10-CM

## 2022-01-25 NOTE — Progress Notes (Signed)
HPI:   ?Ms. Creelman was previously seen in the [facility] clinic due to a personal and family history of cancer and concerns regarding a hereditary predisposition to cancer. Please refer to our prior cancer genetics clinic note for more information regarding our discussion, assessment and recommendations, at the time. Ms. Altamirano's recent genetic test results were disclosed to her, as were recommendations warranted by these results. These results and recommendations are discussed in more detail below. ? ?CANCER HISTORY:  ?Oncology History  ?Malignant neoplasm of upper-outer quadrant of right female breast ([place])  ?11/19/2021 Initial Diagnosis  ? Palpable lump for 3 weeks, diagnostic mammogram: 2.2 cm x 2.2 cm irregular mass with a spiculated margin in the upper outer right breast. 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 HER2 positive, Ki-67 15% ?  ?12/02/2021 Cancer Staging  ? Staging form: Breast, AJCC 8th Edition ?- Clinical stage from 12/02/2021: Stage IB (cT2, cN0, cM0, G2, ER+, PR+, HER2+) - Signed by Nicholas Lose, MD on 12/02/2021 ?Stage prefix: Initial diagnosis ?Histologic grading system: 3 grade system ? ?  ?12/16/2021 -  Chemotherapy  ? Patient is on Treatment Plan : BREAST  Docetaxel + Carboplatin + Trastuzumab + Pertuzumab  (TCHP) q21d   ?   ?01/22/2022 Genetic Testing  ? Negative hereditary cancer genetic testing: no pathogenic variants detected in [facility] +RNAinsight Panel.  Report date is 01/22/2022.  ? ?The CancerNext gene panel offered by [employer] includes sequencing, rearrangement analysis, and RNA analysis for the following 36 genes:   APC, ATM, AXIN2, BARD1, BMPR1A, BRCA1, BRCA2, BRIP1, CDH1, CDK4, CDKN2A, CHEK2, DICER1, HOXB13, EPCAM, GREM1, MLH1, MSH2, MSH3, MSH6, MUTYH, NBN, NF1, NTHL1, PALB2, PMS2, POLD1, POLE, PTEN, RAD51C, RAD51D, RECQL, SMAD4, SMARCA4, STK11, and TP53.  ?  ? ? ?FAMILY HISTORY:  ?We obtained a detailed, 4-generation family history.  Significant  diagnoses are listed below: ?     ?Family History  ?Problem Relation Age of Onset  ? Cervical cancer Mother 25  ? Breast cancer Maternal Aunt    ?      dx late 62s  ? Lung cancer Maternal Uncle    ?      d. 5  ? Bone cancer Paternal Grandmother    ?      primary? limited info; d. 73s  ? Breast cancer Cousin 68  ?      maternal female cousin  ? Ovarian cancer Cousin    ?      maternal cousin; d. 37  ? Cancer Other    ?      cervical or other GYN cancer in several maternal relatives  ?  ?  ?Ms. Mcilwain is unaware of previous family history of genetic testing for hereditary cancer risks. There is no reported Ashkenazi Jewish ancestry. There is no known consanguinity. ?  ? ?GENETIC TEST RESULTS:  ?The [facility] CancerNext+RNAinsight Panel found no pathogenic mutations. The CancerNext gene panel offered by [employer] includes sequencing, rearrangement analysis, and RNA analysis for the following 36 genes:   APC, ATM, AXIN2, BARD1, BMPR1A, BRCA1, BRCA2, BRIP1, CDH1, CDK4, CDKN2A, CHEK2, DICER1, HOXB13, EPCAM, GREM1, MLH1, MSH2, MSH3, MSH6, MUTYH, NBN, NF1, NTHL1, PALB2, PMS2, POLD1, POLE, PTEN, RAD51C, RAD51D, RECQL, SMAD4, SMARCA4, STK11, and TP53.  ? ?The test report has been scanned into EPIC and is located under the Molecular Pathology section of the Results Review tab.  A portion of the result report is included below for reference. Genetic testing reported out on January 22, 2022.  ? ? ? ?Even though a pathogenic variant was not identified, possible explanations for the cancer in the family may include: ?There may be no hereditary risk for cancer in the family. The cancers in Ms. Ellingwood and/or her family may be sporadic/familial or due to other genetic and environmental factors. ?There may be a gene mutation in one of these genes that current testing methods cannot detect but that chance is small. ?There could be another gene that has not yet been discovered, or that we have not yet tested, that is responsible for the  cancer diagnoses in the family.  ?It is also possible there is a hereditary cause for the cancer in the family that Ms. Betzler did not inherit. ? ? ?Therefore, it is important to remain in touch with cancer genetics in the future so that we can continue to offer Ms. Trochez the most up to date genetic testing.  ? ?ADDITIONAL GENETIC TESTING:  ?We discussed with Ms. Willis that her genetic testing was fairly extensive.  If there are other relevant genes identified to increase cancer risk that can be analyzed in the future, we would be happy to discuss and coordinate this testing at that time.   ? ?CANCER SCREENING RECOMMENDATIONS:  ?Ms. Barabas's test result is considered negative (normal).  This means that we have not identified a hereditary cause for her personal history of breast cancer at this time.  ? ?An individual's cancer risk and medical management are not determined by genetic test results alone. Overall cancer risk assessment incorporates additional factors, including personal medical history, family history, and any available genetic information that may result in a personalized plan for cancer prevention and surveillance. Therefore, it is recommended she continue to follow the cancer management and screening guidelines provided by her oncology and primary healthcare provider. ? ?RECOMMENDATIONS FOR FAMILY MEMBERS:   ?Since she did not inherit a identifiable mutation in a cancer predisposition gene included on this panel, her son could not have inherited a known mutation from her in one of these genes. ?Individuals in this family might be at some increased risk of developing cancer, over the general population risk, due to the family history of cancer.  Individuals in the family should notify their providers of the family history of cancer. We recommend women in this family have a yearly mammogram beginning at age 1, or 64 years younger than the earliest onset of cancer, an annual clinical breast exam, and  perform monthly breast self-exams.   ?Other members of the family may still carry a pathogenic variant in one of these genes that Ms. Tat did not inherit. Based on the family history, we recommend her paternal aunt and cousin with breast cancer histories have genetic counseling and testing. Ms. Czerwinski can let us know if we can be of any assistance in coordinating genetic counseling and/or testing for these family members.   ? ?FOLLOW-UP:  ?Lastly, we discussed with Ms. Cumbo that cancer genetics is a rapidly advancing field and it is possible that new genetic tests will be appropriate for her and/or her family members in the future. We encouraged her to remain in contact with cancer genetics on an annual basis so we can update her personal and family histories and let her know of advances in cancer genetics that may benefit this family.  ? ?Our contact number was provided. Ms. Wheat's questions were answered to her satisfaction, and she knows she is welcome to call us at anytime with  additional questions or concerns.  ? ?Treysen Sudbeck M. Joette Catching, [place], [place] ?Genetic Counselor ?[email or web address] ?(P) [phone] ? ?

## 2022-01-25 NOTE — Telephone Encounter (Signed)
Revealed negative genetic testing.  Discussed that we do not know why she has breast cancer or why there is cancer in the family. It could be sporadic/famillial, due to a change in a gene that she did not inherit, due to a different gene that we are not testing, or maybe our current technology may not be able to pick something up.  It will be important for her to keep in contact with genetics to keep up with whether additional testing may be needed.     

## 2022-01-26 NOTE — Progress Notes (Addendum)
? ?Patient Care Team: ?[name], NP as PCP - General (Adult Health Nurse Practitioner) ?Rockwell Germany, RN as Oncology Nurse Navigator ?Mauro Kaufmann, RN as Oncology Nurse Navigator ?Coralie Keens, MD as Consulting Physician (General Surgery) ?Nicholas Lose, MD as Consulting Physician (Hematology and Oncology) ?Gery Pray, MD as Consulting Physician (Radiation Oncology) ? ?DIAGNOSIS:  ?Encounter Diagnosis  ?Name Primary?  ? Malignant neoplasm of upper-outer quadrant of right breast in female, estrogen receptor positive ([place])   ? ? ?SUMMARY OF ONCOLOGIC HISTORY: ?Oncology History  ?Malignant neoplasm of upper-outer quadrant of right female breast ([place])  ?11/19/2021 Initial Diagnosis  ? Palpable lump for 3 weeks, diagnostic mammogram: 2.2 cm x 2.2 cm irregular mass with a spiculated margin in the upper outer right breast. 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 HER2 positive, Ki-67 15% ?  ?12/02/2021 Cancer Staging  ? Staging form: Breast, AJCC 8th Edition ?- Clinical stage from 12/02/2021: Stage IB (cT2, cN0, cM0, G2, ER+, PR+, HER2+) - Signed by Nicholas Lose, MD on 12/02/2021 ?Stage prefix: Initial diagnosis ?Histologic grading system: 3 grade system ? ?  ?12/16/2021 -  Chemotherapy  ? Patient is on Treatment Plan : BREAST  Docetaxel + Carboplatin + Trastuzumab + Pertuzumab  (TCHP) q21d   ?   ?01/22/2022 Genetic Testing  ? Negative hereditary cancer genetic testing: no pathogenic variants detected in [place] +RNAinsight Panel.  Report date is 01/22/2022.  ? ?The CancerNext gene panel offered by [employer] includes sequencing, rearrangement analysis, and RNA analysis for the following 36 genes:   APC, ATM, AXIN2, BARD1, BMPR1A, BRCA1, BRCA2, BRIP1, CDH1, CDK4, CDKN2A, CHEK2, DICER1, HOXB13, EPCAM, GREM1, MLH1, MSH2, MSH3, MSH6, MUTYH, NBN, NF1, NTHL1, PALB2, PMS2, POLD1, POLE, PTEN, RAD51C, RAD51D, RECQL, SMAD4, SMARCA4, STK11, and TP53.  ?  ? ? ?CHIEF COMPLIANT: Cycle 3  TCHP ? ?INTERVAL HISTORY: Julie Atkinson is a  ? 51 y.o. with above-mentioned history of right breast cancer, currently on chemotherapy with TCHP. She presents to the clinic today for treatment. She states that her bowels been ok still loose but she is managing it. She complains of right ear pain. ?She states that she haven't any bone pain. She complains of a rash on face. ? ? ? ?ALLERGIES:  is allergic to codeine. ? ?MEDICATIONS:  ?Current Outpatient Medications  ?Medication Sig Dispense Refill  ? FLUoxetine (PROZAC) 20 MG capsule TAKE 1 CAPSULE(20 MG) BY MOUTH DAILY 90 capsule 3  ? amoxicillin-clavulanate (AUGMENTIN) 875-125 MG tablet Take 1 tablet by mouth 2 (two) times daily. 14 tablet 0  ? clonazePAM (KLONOPIN) 0.5 MG disintegrating tablet Take 1 tablet (0.5 mg total) by mouth 2 (two) times daily. 30 tablet 0  ? dexamethasone (DECADRON) 4 MG tablet Take 1 tablet (4 mg total) by mouth daily. Take 1 tablet day before chemo and 1 tablet day after chemo with food 12 tablet 0  ? diphenoxylate-atropine (LOMOTIL) 2.5-0.025 MG tablet Take 1 tablet by mouth 3 (three) times daily as needed for diarrhea or loose stools. 60 tablet 3  ? fluconazole (DIFLUCAN) 100 MG tablet Take 1 tablet (100 mg total) by mouth daily. 7 tablet 1  ? lidocaine-prilocaine (EMLA) cream Apply to affected area once 30 g 3  ? LORazepam (ATIVAN) 0.5 MG tablet Take 1 tablet (0.5 mg total) by mouth at bedtime as needed for anxiety. 30 tablet 0  ? ondansetron (ZOFRAN) 8 MG tablet Take 1 tablet (8 mg total) by mouth 2 (two) times daily as needed (Nausea or  vomiting). Start on the third day after chemotherapy. 30 tablet 1  ? prochlorperazine (COMPAZINE) 10 MG tablet Take 1 tablet (10 mg total) by mouth every 6 (six) hours as needed (Nausea or vomiting). 30 tablet 1  ? traMADol (ULTRAM) 50 MG tablet Take 1 tablet (50 mg total) by mouth every 6 (six) hours as needed. 20 tablet 0  ? ?No current facility-administered medications for this visit.   ? ? ?PHYSICAL EXAMINATION: ?ECOG PERFORMANCE STATUS: 1 - Symptomatic but completely ambulatory ? ?Vitals:  ? 01/27/22 1123  ?BP: (!) 159/82  ?Pulse: 86  ?Resp: 18  ?Temp: (!) 97.5 ?F (36.4 ?C)  ?SpO2: 100%  ? ?Filed Weights  ? 01/27/22 1123  ?Weight: 194 lb 6.4 oz (88.2 kg)  ? ?  ? ?LABORATORY DATA:  ?I have reviewed the data as listed ? ?  Latest Ref Rng & Units 01/27/2022  ? 10:57 AM 01/12/2022  ? 12:32 PM 01/06/2022  ? 11:15 AM  ?CMP  ?Glucose 70 - 99 mg/dL 87   91   93    ?BUN 6 - 20 mg/dL 9   7   14     ?Creatinine 0.44 - 1.00 mg/dL 0.71   0.59   0.57    ?Sodium 135 - 145 mmol/L 135   133   138    ?Potassium 3.5 - 5.1 mmol/L 3.4   4.2   4.3    ?Chloride 98 - 111 mmol/L 103   101   108    ?CO2 22 - 32 mmol/L 25   25   25     ?Calcium 8.9 - 10.3 mg/dL 8.9   9.8   9.4    ?Total Protein 6.5 - 8.1 g/dL 6.7   7.3   6.9    ?Total Bilirubin 0.3 - 1.2 mg/dL 0.4   0.6   0.3    ?Alkaline Phos 38 - 126 U/L 70   68   61    ?AST 15 - 41 U/L 17   18   12     ?ALT 0 - 44 U/L 17   24   17     ? ? ?Lab Results  ?Component Value Date  ? WBC 12.6 (H) 01/27/2022  ? HGB 11.1 (L) 01/27/2022  ? HCT 32.7 (L) 01/27/2022  ? MCV 96.7 01/27/2022  ? PLT 230 01/27/2022  ? NEUTROABS 8.2 (H) 01/27/2022  ? ? ?ASSESSMENT & PLAN:  ?Malignant neoplasm of upper-outer quadrant of right female breast ([place]) ?11/19/2021:Palpable lump for 3 weeks, diagnostic mammogram: 2.2 cm x 2.2 cm irregular mass with a spiculated margin in the upper outer right breast. 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 HER2 positive, Ki-67 15% ?MRI Breast 12/09/21:  Right Breast retroareolar mass 6.7 cm (involves nipple areolar complex), Rt axilla 2.1 cm node, Left breast: Several scattered foci left breast indeterminate 0.6 cm mass LIQ left breast and another indeterminate 0.6 cm mass in upper central Left breast (biopsies recommended: rt axillary LN, Left Breast indeterminate mass) ?  ?Treatment Plan: ?1. Neoadjuvant chemotherapy with TCHP foll by HP vs Kadcyla maintenance ?2.  Followed by breast conserving surgery with sentinel lymph node study vs targeted axillary dissection ?3. Followed by adjuvant radiation therapy ?------------------------------------------------------------------------------------------------------------------------------------ ?Current Treatment: Cycle 3 TCHP ?Chemo toxicities: ?Diarrhea: Intermittently.  3-5 loose stools per day.  She does not like to take medications but she is taking Lomotil on and off. ?Chemotherapy-induced anemia: Monitoring closely ?Mild nausea ?Severe fatigue ?Rash on the face: Not related to chemotherapy  has improved. ?  ?RTC in 3 weeks for cycle 4 ?  ?No orders of the defined types were placed in this encounter. ? ?The patient has a good understanding of the overall plan. she agrees with it. she will call with any problems that may develop before the next visit here. ?Total time spent: 30 mins including face to face time and time spent for planning, charting and co-ordination of care ? ? Harriette Ohara, MD ?01/27/22 ? ? ? I Gardiner Coins am scribing for Dr. Lindi Adie ? ?I have reviewed the above documentation for accuracy and completeness, and I agree with the above. ?  ?

## 2022-01-27 ENCOUNTER — Inpatient Hospital Stay (HOSPITAL_BASED_OUTPATIENT_CLINIC_OR_DEPARTMENT_OTHER): Payer: BC Managed Care – PPO | Admitting: Hematology and Oncology

## 2022-01-27 ENCOUNTER — Inpatient Hospital Stay: Payer: BC Managed Care – PPO

## 2022-01-27 ENCOUNTER — Other Ambulatory Visit: Payer: Self-pay

## 2022-01-27 ENCOUNTER — Ambulatory Visit: Payer: BC Managed Care – PPO | Admitting: Hematology and Oncology

## 2022-01-27 ENCOUNTER — Ambulatory Visit: Payer: BC Managed Care – PPO

## 2022-01-27 ENCOUNTER — Other Ambulatory Visit: Payer: BC Managed Care – PPO

## 2022-01-27 ENCOUNTER — Encounter: Payer: Self-pay | Admitting: Hematology and Oncology

## 2022-01-27 DIAGNOSIS — Z5112 Encounter for antineoplastic immunotherapy: Secondary | ICD-10-CM | POA: Diagnosis not present

## 2022-01-27 DIAGNOSIS — Z170 Estrogen receptor positive status [ER+]: Secondary | ICD-10-CM

## 2022-01-27 DIAGNOSIS — C50411 Malignant neoplasm of upper-outer quadrant of right female breast: Secondary | ICD-10-CM

## 2022-01-27 DIAGNOSIS — Z8041 Family history of malignant neoplasm of ovary: Secondary | ICD-10-CM | POA: Diagnosis not present

## 2022-01-27 DIAGNOSIS — Z801 Family history of malignant neoplasm of trachea, bronchus and lung: Secondary | ICD-10-CM | POA: Diagnosis not present

## 2022-01-27 DIAGNOSIS — Z87891 Personal history of nicotine dependence: Secondary | ICD-10-CM | POA: Diagnosis not present

## 2022-01-27 DIAGNOSIS — Z5111 Encounter for antineoplastic chemotherapy: Secondary | ICD-10-CM | POA: Diagnosis not present

## 2022-01-27 DIAGNOSIS — Z95828 Presence of other vascular implants and grafts: Secondary | ICD-10-CM

## 2022-01-27 DIAGNOSIS — Z803 Family history of malignant neoplasm of breast: Secondary | ICD-10-CM | POA: Diagnosis not present

## 2022-01-27 DIAGNOSIS — Z8049 Family history of malignant neoplasm of other genital organs: Secondary | ICD-10-CM | POA: Diagnosis not present

## 2022-01-27 DIAGNOSIS — Z5189 Encounter for other specified aftercare: Secondary | ICD-10-CM | POA: Diagnosis not present

## 2022-01-27 LAB — CBC WITH DIFFERENTIAL (CANCER CENTER ONLY)
Abs Immature Granulocytes: 0.06 10*3/uL (ref 0.00–0.07)
Basophils Absolute: 0.1 10*3/uL (ref 0.0–0.1)
Basophils Relative: 1 %
Eosinophils Absolute: 0 10*3/uL (ref 0.0–0.5)
Eosinophils Relative: 0 %
HCT: 32.7 % — ABNORMAL LOW (ref 36.0–46.0)
Hemoglobin: 11.1 g/dL — ABNORMAL LOW (ref 12.0–15.0)
Immature Granulocytes: 1 %
Lymphocytes Relative: 26 %
Lymphs Abs: 3.3 10*3/uL (ref 0.7–4.0)
MCH: 32.8 pg (ref 26.0–34.0)
MCHC: 33.9 g/dL (ref 30.0–36.0)
MCV: 96.7 fL (ref 80.0–100.0)
Monocytes Absolute: 1 10*3/uL (ref 0.1–1.0)
Monocytes Relative: 8 %
Neutro Abs: 8.2 10*3/uL — ABNORMAL HIGH (ref 1.7–7.7)
Neutrophils Relative %: 64 %
Platelet Count: 230 10*3/uL (ref 150–400)
RBC: 3.38 MIL/uL — ABNORMAL LOW (ref 3.87–5.11)
RDW: 13.7 % (ref 11.5–15.5)
WBC Count: 12.6 10*3/uL — ABNORMAL HIGH (ref 4.0–10.5)
nRBC: 0 % (ref 0.0–0.2)

## 2022-01-27 LAB — CMP (CANCER CENTER ONLY)
ALT: 17 U/L (ref 0–44)
AST: 17 U/L (ref 15–41)
Albumin: 4 g/dL (ref 3.5–5.0)
Alkaline Phosphatase: 70 U/L (ref 38–126)
Anion gap: 7 (ref 5–15)
BUN: 9 mg/dL (ref 6–20)
CO2: 25 mmol/L (ref 22–32)
Calcium: 8.9 mg/dL (ref 8.9–10.3)
Chloride: 103 mmol/L (ref 98–111)
Creatinine: 0.71 mg/dL (ref 0.44–1.00)
GFR, Estimated: >60 mL/min (ref >60)
Glucose, Bld: 87 mg/dL (ref 70–99)
Potassium: 3.4 mmol/L — ABNORMAL LOW (ref 3.5–5.1)
Sodium: 135 mmol/L (ref 135–145)
Total Bilirubin: 0.4 mg/dL (ref 0.3–1.2)
Total Protein: 6.7 g/dL (ref 6.5–8.1)

## 2022-01-27 MED ORDER — SODIUM CHLORIDE 0.9 % IV SOLN
75.0000 mg/m2 | Freq: Once | INTRAVENOUS | Status: AC
  Administered 2022-01-27: 150 mg via INTRAVENOUS
  Filled 2022-01-27: qty 15

## 2022-01-27 MED ORDER — SODIUM CHLORIDE 0.9 % IV SOLN
700.0000 mg | Freq: Once | INTRAVENOUS | Status: AC
  Administered 2022-01-27: 700 mg via INTRAVENOUS
  Filled 2022-01-27: qty 70

## 2022-01-27 MED ORDER — ACETAMINOPHEN 325 MG PO TABS
650.0000 mg | ORAL_TABLET | Freq: Once | ORAL | Status: AC
  Administered 2022-01-27: 650 mg via ORAL
  Filled 2022-01-27: qty 2

## 2022-01-27 MED ORDER — TRASTUZUMAB-DKST CHEMO 150 MG IV SOLR
6.0000 mg/kg | Freq: Once | INTRAVENOUS | Status: AC
  Administered 2022-01-27: 525 mg via INTRAVENOUS
  Filled 2022-01-27: qty 25

## 2022-01-27 MED ORDER — SODIUM CHLORIDE 0.9 % IV SOLN
420.0000 mg | Freq: Once | INTRAVENOUS | Status: AC
  Administered 2022-01-27: 420 mg via INTRAVENOUS
  Filled 2022-01-27: qty 14

## 2022-01-27 MED ORDER — SODIUM CHLORIDE 0.9% FLUSH
10.0000 mL | Freq: Once | INTRAVENOUS | Status: AC
  Administered 2022-01-27: 10 mL

## 2022-01-27 MED ORDER — PALONOSETRON HCL INJECTION 0.25 MG/5ML
0.2500 mg | Freq: Once | INTRAVENOUS | Status: AC
  Administered 2022-01-27: 0.25 mg via INTRAVENOUS
  Filled 2022-01-27: qty 5

## 2022-01-27 MED ORDER — DIPHENHYDRAMINE HCL 25 MG PO CAPS
50.0000 mg | ORAL_CAPSULE | Freq: Once | ORAL | Status: AC
  Administered 2022-01-27: 50 mg via ORAL
  Filled 2022-01-27: qty 2

## 2022-01-27 MED ORDER — SODIUM CHLORIDE 0.9 % IV SOLN
150.0000 mg | Freq: Once | INTRAVENOUS | Status: AC
  Administered 2022-01-27: 150 mg via INTRAVENOUS
  Filled 2022-01-27: qty 150

## 2022-01-27 MED ORDER — SODIUM CHLORIDE 0.9 % IV SOLN: Freq: Once | INTRAVENOUS | Status: AC

## 2022-01-27 MED ORDER — SODIUM CHLORIDE 0.9 % IV SOLN
10.0000 mg | Freq: Once | INTRAVENOUS | Status: AC
  Administered 2022-01-27: 10 mg via INTRAVENOUS
  Filled 2022-01-27: qty 10

## 2022-01-27 NOTE — Patient Instructions (Signed)
[facility]  Discharge Instructions: ?Thank you for choosing [facility] to provide your oncology and hematology care.  ? ?If you have a lab appointment with the [facility], please go directly to the [facility] and check in at the registration area. ?  ?Wear comfortable clothing and clothing appropriate for easy access to any Portacath or PICC line.  ? ?We strive to give you quality time with your provider. You may need to reschedule your appointment if you arrive late (15 or more minutes).  Arriving late affects you and other patients whose appointments are after yours.  Also, if you miss three or more appointments without notifying the office, you may be dismissed from the clinic at the provider?s discretion.    ?  ?For prescription refill requests, have your pharmacy contact our office and allow 72 hours for refills to be completed.   ? ?Today you received the following chemotherapy and/or immunotherapy agents trastuzumab, pertuzumab, paclitaxel, carboplatin    ?  ?To help prevent nausea and vomiting after your treatment, we encourage you to take your nausea medication as directed. ? ?BELOW ARE SYMPTOMS THAT SHOULD BE REPORTED IMMEDIATELY: ?*FEVER GREATER THAN 100.4 F (38 ?C) OR HIGHER ?*CHILLS OR SWEATING ?*NAUSEA AND VOMITING THAT IS NOT CONTROLLED WITH YOUR NAUSEA MEDICATION ?*UNUSUAL SHORTNESS OF BREATH ?*UNUSUAL BRUISING OR BLEEDING ?*URINARY PROBLEMS (pain or burning when urinating, or frequent urination) ?*BOWEL PROBLEMS (unusual diarrhea, constipation, pain near the anus) ?TENDERNESS IN MOUTH AND THROAT WITH OR WITHOUT PRESENCE OF ULCERS (sore throat, sores in mouth, or a toothache) ?UNUSUAL RASH, SWELLING OR PAIN  ?UNUSUAL VAGINAL DISCHARGE OR ITCHING  ? ?Items with * indicate a potential emergency and should be followed up as soon as possible or go to the Emergency Department if any problems should occur. ? ?Please show the CHEMOTHERAPY ALERT CARD or  IMMUNOTHERAPY ALERT CARD at check-in to the Emergency Department and triage nurse. ? ?Should you have questions after your visit or need to cancel or reschedule your appointment, please contact [facility]  Dept: [phone]  and follow the prompts.  Office hours are 8:00 a.m. to 4:30 p.m. Monday - Friday. Please note that voicemails left after 4:00 p.m. may not be returned until the following business day.  We are closed weekends and major holidays. You have access to a nurse at all times for urgent questions. Please call the main number to the clinic Dept: [phone] and follow the prompts. ? ? ?For any non-urgent questions, you may also contact your provider using MyChart. We now offer e-Visits for anyone 81 and older to request care online for non-urgent symptoms. For details visit [email or web address]. ?  ?Also download the MyChart app! Go to the app store, search "MyChart", open the app, select [facility], and log in with your MyChart username and password. ? ?Due to Covid, a mask is required upon entering the hospital/clinic. If you do not have a mask, one will be given to you upon arrival. For doctor visits, patients may have 1 support person aged 54 or older with them. For treatment visits, patients cannot have anyone with them due to current Covid guidelines and our immunocompromised population.  ? ?

## 2022-01-27 NOTE — Assessment & Plan Note (Addendum)
11/19/2021:Palpable lump for 3 weeks, diagnostic mammogram: 2.2 cm x 2.2 cm irregular mass with a spiculated margin in the upper outer right breast. 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 HER2 positive, Ki-67 15% ?MRI Breast 12/09/21: ?Right Breast retroareolar mass 6.7 cm (involves nipple areolar complex), Rt axilla 2.1 cm node, Left breast: Several scattered foci left breast indeterminate 0.6 cm mass LIQ left breast and another indeterminate 0.6 cm mass in upper central Left breast (biopsies recommended: rt axillary LN, Left Breast indeterminate mass) ?? ?Treatment Plan: ?1. Neoadjuvant chemotherapy with?TCHP foll by HP vs Kadcyla maintenance ?2. Followed by breast conserving surgery with sentinel lymph node study vs targeted axillary dissection ?3. Followed by adjuvant radiation therapy ?------------------------------------------------------------------------------------------------------------------------------------ ?Current Treatment: Cycle 3?TCHP ?Chemo toxicities: ?1. Diarrhea: Intermittently.  3-5 loose stools per day.  She does not like to take medications but she is taking Lomotil on and off. ?2. Chemotherapy-induced anemia: Monitoring closely ?3. Mild nausea ?4. Severe fatigue ?5. Rash on the face: Not related to chemotherapy has improved. ? ?Neulasta was accidentally missed with the last treatment but in spite of that her white count is excellent today.  Therefore we will skip Neulasta again for this cycle.  If her counts start to go down then we might have to reinstate the Neulasta injection. ? ?? ?RTC in 3 weeks for cycle 4 ?? ?

## 2022-01-28 ENCOUNTER — Other Ambulatory Visit: Payer: Self-pay | Admitting: Hematology and Oncology

## 2022-01-28 ENCOUNTER — Telehealth: Payer: Self-pay

## 2022-01-28 MED ORDER — LORAZEPAM 0.5 MG PO TABS: 0.5000 mg | ORAL_TABLET | Freq: Every evening | ORAL | 0 refills | Status: DC | PRN

## 2022-01-28 NOTE — Telephone Encounter (Signed)
Called pt regarding mychart message. Pt states she is feeling better today and her HR is WNL according to her apple watch at this time. Pt states she has had panic attacks in the past and this was what it felt like and she became slightly ShOB but HR and ShOB resolved when she laid down. Pt requested refill on Ativan 0.5 mg. Advised pt I would make MD aware of request. Advised pt to call us next time this happens instead of sending mychart message as medical concerns as such need to be on VM. Pt verbalized thanks and understanding and knows to call if this should occur again.  ?

## 2022-01-29 ENCOUNTER — Inpatient Hospital Stay: Payer: BC Managed Care – PPO

## 2022-01-29 ENCOUNTER — Ambulatory Visit: Payer: BC Managed Care – PPO

## 2022-01-29 ENCOUNTER — Other Ambulatory Visit: Payer: Self-pay

## 2022-01-29 VITALS — BP 142/96 | HR 78 | Temp 99.3°F | Resp 18

## 2022-01-29 DIAGNOSIS — Z170 Estrogen receptor positive status [ER+]: Secondary | ICD-10-CM | POA: Diagnosis not present

## 2022-01-29 DIAGNOSIS — Z87891 Personal history of nicotine dependence: Secondary | ICD-10-CM | POA: Diagnosis not present

## 2022-01-29 DIAGNOSIS — Z5112 Encounter for antineoplastic immunotherapy: Secondary | ICD-10-CM | POA: Diagnosis not present

## 2022-01-29 DIAGNOSIS — Z803 Family history of malignant neoplasm of breast: Secondary | ICD-10-CM | POA: Diagnosis not present

## 2022-01-29 DIAGNOSIS — Z8041 Family history of malignant neoplasm of ovary: Secondary | ICD-10-CM | POA: Diagnosis not present

## 2022-01-29 DIAGNOSIS — C50411 Malignant neoplasm of upper-outer quadrant of right female breast: Secondary | ICD-10-CM | POA: Diagnosis not present

## 2022-01-29 DIAGNOSIS — Z5189 Encounter for other specified aftercare: Secondary | ICD-10-CM | POA: Diagnosis not present

## 2022-01-29 DIAGNOSIS — Z5111 Encounter for antineoplastic chemotherapy: Secondary | ICD-10-CM | POA: Diagnosis not present

## 2022-01-29 DIAGNOSIS — Z801 Family history of malignant neoplasm of trachea, bronchus and lung: Secondary | ICD-10-CM | POA: Diagnosis not present

## 2022-01-29 DIAGNOSIS — Z8049 Family history of malignant neoplasm of other genital organs: Secondary | ICD-10-CM | POA: Diagnosis not present

## 2022-01-29 MED ORDER — PEGFILGRASTIM-CBQV 6 MG/0.6ML ~~LOC~~ SOSY
6.0000 mg | PREFILLED_SYRINGE | Freq: Once | SUBCUTANEOUS | Status: AC
  Administered 2022-01-29: 6 mg via SUBCUTANEOUS
  Filled 2022-01-29: qty 0.6

## 2022-01-31 ENCOUNTER — Encounter: Payer: Self-pay | Admitting: Hematology and Oncology

## 2022-02-01 ENCOUNTER — Other Ambulatory Visit: Payer: Self-pay | Admitting: *Deleted

## 2022-02-01 MED ORDER — FLUCONAZOLE 100 MG PO TABS: 100.0000 mg | ORAL_TABLET | Freq: Every day | ORAL | 1 refills | Status: DC

## 2022-02-17 ENCOUNTER — Other Ambulatory Visit: Payer: Self-pay

## 2022-02-17 ENCOUNTER — Inpatient Hospital Stay (HOSPITAL_BASED_OUTPATIENT_CLINIC_OR_DEPARTMENT_OTHER): Payer: BC Managed Care – PPO | Admitting: Hematology and Oncology

## 2022-02-17 ENCOUNTER — Ambulatory Visit: Payer: BC Managed Care – PPO | Admitting: Hematology and Oncology

## 2022-02-17 ENCOUNTER — Inpatient Hospital Stay: Payer: BC Managed Care – PPO

## 2022-02-17 ENCOUNTER — Other Ambulatory Visit: Payer: BC Managed Care – PPO

## 2022-02-17 ENCOUNTER — Encounter: Payer: Self-pay | Admitting: *Deleted

## 2022-02-17 ENCOUNTER — Other Ambulatory Visit: Payer: Self-pay | Admitting: *Deleted

## 2022-02-17 ENCOUNTER — Ambulatory Visit: Payer: BC Managed Care – PPO

## 2022-02-17 ENCOUNTER — Inpatient Hospital Stay: Payer: BC Managed Care – PPO | Attending: Hematology and Oncology

## 2022-02-17 ENCOUNTER — Encounter: Payer: Self-pay | Admitting: Hematology and Oncology

## 2022-02-17 ENCOUNTER — Telehealth: Payer: Self-pay

## 2022-02-17 VITALS — BP 146/94 | HR 80 | Temp 98.7°F | Resp 18 | Wt 189.0 lb

## 2022-02-17 DIAGNOSIS — Z170 Estrogen receptor positive status [ER+]: Secondary | ICD-10-CM

## 2022-02-17 DIAGNOSIS — Z5112 Encounter for antineoplastic immunotherapy: Secondary | ICD-10-CM | POA: Insufficient documentation

## 2022-02-17 DIAGNOSIS — C50411 Malignant neoplasm of upper-outer quadrant of right female breast: Secondary | ICD-10-CM | POA: Diagnosis not present

## 2022-02-17 DIAGNOSIS — Z95828 Presence of other vascular implants and grafts: Secondary | ICD-10-CM

## 2022-02-17 DIAGNOSIS — Z5111 Encounter for antineoplastic chemotherapy: Secondary | ICD-10-CM | POA: Diagnosis not present

## 2022-02-17 DIAGNOSIS — Z5189 Encounter for other specified aftercare: Secondary | ICD-10-CM | POA: Diagnosis not present

## 2022-02-17 LAB — CMP (CANCER CENTER ONLY)
ALT: 20 U/L (ref 0–44)
AST: 23 U/L (ref 15–41)
Albumin: 3.8 g/dL (ref 3.5–5.0)
Alkaline Phosphatase: 65 U/L (ref 38–126)
Anion gap: 5 (ref 5–15)
BUN: 9 mg/dL (ref 6–20)
CO2: 25 mmol/L (ref 22–32)
Calcium: 8.5 mg/dL — ABNORMAL LOW (ref 8.9–10.3)
Chloride: 107 mmol/L (ref 98–111)
Creatinine: 0.61 mg/dL (ref 0.44–1.00)
GFR, Estimated: >60 mL/min (ref >60)
Glucose, Bld: 77 mg/dL (ref 70–99)
Potassium: 3.7 mmol/L (ref 3.5–5.1)
Sodium: 137 mmol/L (ref 135–145)
Total Bilirubin: 0.4 mg/dL (ref 0.3–1.2)
Total Protein: 6.5 g/dL (ref 6.5–8.1)

## 2022-02-17 LAB — CBC WITH DIFFERENTIAL (CANCER CENTER ONLY)
Abs Immature Granulocytes: 0.02 10*3/uL (ref 0.00–0.07)
Basophils Absolute: 0.1 10*3/uL (ref 0.0–0.1)
Basophils Relative: 1 %
Eosinophils Absolute: 0 10*3/uL (ref 0.0–0.5)
Eosinophils Relative: 0 %
HCT: 33.1 % — ABNORMAL LOW (ref 36.0–46.0)
Hemoglobin: 11.2 g/dL — ABNORMAL LOW (ref 12.0–15.0)
Immature Granulocytes: 1 %
Lymphocytes Relative: 43 %
Lymphs Abs: 1.7 10*3/uL (ref 0.7–4.0)
MCH: 33 pg (ref 26.0–34.0)
MCHC: 33.8 g/dL (ref 30.0–36.0)
MCV: 97.6 fL (ref 80.0–100.0)
Monocytes Absolute: 0.5 10*3/uL (ref 0.1–1.0)
Monocytes Relative: 13 %
Neutro Abs: 1.6 10*3/uL — ABNORMAL LOW (ref 1.7–7.7)
Neutrophils Relative %: 42 %
Platelet Count: 205 10*3/uL (ref 150–400)
RBC: 3.39 MIL/uL — ABNORMAL LOW (ref 3.87–5.11)
RDW: 14.6 % (ref 11.5–15.5)
WBC Count: 3.9 10*3/uL — ABNORMAL LOW (ref 4.0–10.5)
nRBC: 0 % (ref 0.0–0.2)

## 2022-02-17 MED ORDER — SODIUM CHLORIDE 0.9 % IV SOLN: Freq: Once | INTRAVENOUS | Status: AC

## 2022-02-17 MED ORDER — SODIUM CHLORIDE 0.9 % IV SOLN
700.0000 mg | Freq: Once | INTRAVENOUS | Status: AC
  Administered 2022-02-17: 700 mg via INTRAVENOUS
  Filled 2022-02-17: qty 70

## 2022-02-17 MED ORDER — ACETAMINOPHEN 325 MG PO TABS
650.0000 mg | ORAL_TABLET | Freq: Once | ORAL | Status: AC
  Administered 2022-02-17: 650 mg via ORAL
  Filled 2022-02-17: qty 2

## 2022-02-17 MED ORDER — SODIUM CHLORIDE 0.9 % IV SOLN
75.0000 mg/m2 | Freq: Once | INTRAVENOUS | Status: AC
  Administered 2022-02-17: 150 mg via INTRAVENOUS
  Filled 2022-02-17: qty 15

## 2022-02-17 MED ORDER — SODIUM CHLORIDE 0.9 % IV SOLN
420.0000 mg | Freq: Once | INTRAVENOUS | Status: AC
  Administered 2022-02-17: 420 mg via INTRAVENOUS
  Filled 2022-02-17: qty 14

## 2022-02-17 MED ORDER — TRASTUZUMAB-DKST CHEMO 150 MG IV SOLR
6.0000 mg/kg | Freq: Once | INTRAVENOUS | Status: AC
  Administered 2022-02-17: 525 mg via INTRAVENOUS
  Filled 2022-02-17: qty 25

## 2022-02-17 MED ORDER — SODIUM CHLORIDE 0.9% FLUSH
10.0000 mL | INTRAVENOUS | Status: DC | PRN
  Administered 2022-02-17: 10 mL

## 2022-02-17 MED ORDER — SODIUM CHLORIDE 0.9 % IV SOLN
150.0000 mg | Freq: Once | INTRAVENOUS | Status: AC
  Administered 2022-02-17: 150 mg via INTRAVENOUS
  Filled 2022-02-17: qty 150

## 2022-02-17 MED ORDER — SODIUM CHLORIDE 0.9 % IV SOLN
10.0000 mg | Freq: Once | INTRAVENOUS | Status: AC
  Administered 2022-02-17: 10 mg via INTRAVENOUS
  Filled 2022-02-17: qty 10

## 2022-02-17 MED ORDER — DEXAMETHASONE 4 MG PO TABS: 4.0000 mg | ORAL_TABLET | Freq: Every day | ORAL | 0 refills | Status: DC

## 2022-02-17 MED ORDER — SODIUM CHLORIDE 0.9% FLUSH
10.0000 mL | Freq: Once | INTRAVENOUS | Status: AC
  Administered 2022-02-17: 10 mL

## 2022-02-17 MED ORDER — HEPARIN SOD (PORK) LOCK FLUSH 100 UNIT/ML IV SOLN
500.0000 [IU] | Freq: Once | INTRAVENOUS | Status: AC | PRN
  Administered 2022-02-17: 500 [IU]

## 2022-02-17 MED ORDER — MAGIC MOUTHWASH W/LIDOCAINE: 5.0000 mL | Freq: Three times a day (TID) | ORAL | 2 refills | Status: DC | PRN

## 2022-02-17 MED ORDER — PALONOSETRON HCL INJECTION 0.25 MG/5ML
0.2500 mg | Freq: Once | INTRAVENOUS | Status: AC
  Administered 2022-02-17: 0.25 mg via INTRAVENOUS
  Filled 2022-02-17: qty 5

## 2022-02-17 MED ORDER — DIPHENHYDRAMINE HCL 25 MG PO CAPS
50.0000 mg | ORAL_CAPSULE | Freq: Once | ORAL | Status: AC
  Administered 2022-02-17: 50 mg via ORAL
  Filled 2022-02-17: qty 2

## 2022-02-17 NOTE — Patient Instructions (Signed)
[facility]  Discharge Instructions: ?Thank you for choosing [facility] to provide your oncology and hematology care.  ? ?If you have a lab appointment with the [facility], please go directly to the [facility] and check in at the registration area. ?  ?Wear comfortable clothing and clothing appropriate for easy access to any Portacath or PICC line.  ? ?We strive to give you quality time with your provider. You may need to reschedule your appointment if you arrive late (15 or more minutes).  Arriving late affects you and other patients whose appointments are after yours.  Also, if you miss three or more appointments without notifying the office, you may be dismissed from the clinic at the provider?s discretion.    ?  ?For prescription refill requests, have your pharmacy contact our office and allow 72 hours for refills to be completed.   ? ?Today you received the following chemotherapy and/or immunotherapy agents trastuzumab, pertuzumab, paclitaxel, carboplatin    ?  ?To help prevent nausea and vomiting after your treatment, we encourage you to take your nausea medication as directed. ? ?BELOW ARE SYMPTOMS THAT SHOULD BE REPORTED IMMEDIATELY: ?*FEVER GREATER THAN 100.4 F (38 ?C) OR HIGHER ?*CHILLS OR SWEATING ?*NAUSEA AND VOMITING THAT IS NOT CONTROLLED WITH YOUR NAUSEA MEDICATION ?*UNUSUAL SHORTNESS OF BREATH ?*UNUSUAL BRUISING OR BLEEDING ?*URINARY PROBLEMS (pain or burning when urinating, or frequent urination) ?*BOWEL PROBLEMS (unusual diarrhea, constipation, pain near the anus) ?TENDERNESS IN MOUTH AND THROAT WITH OR WITHOUT PRESENCE OF ULCERS (sore throat, sores in mouth, or a toothache) ?UNUSUAL RASH, SWELLING OR PAIN  ?UNUSUAL VAGINAL DISCHARGE OR ITCHING  ? ?Items with * indicate a potential emergency and should be followed up as soon as possible or go to the Emergency Department if any problems should occur. ? ?Please show the CHEMOTHERAPY ALERT CARD or  IMMUNOTHERAPY ALERT CARD at check-in to the Emergency Department and triage nurse. ? ?Should you have questions after your visit or need to cancel or reschedule your appointment, please contact [facility]  Dept: [phone]  and follow the prompts.  Office hours are 8:00 a.m. to 4:30 p.m. Monday - Friday. Please note that voicemails left after 4:00 p.m. may not be returned until the following business day.  We are closed weekends and major holidays. You have access to a nurse at all times for urgent questions. Please call the main number to the clinic Dept: [phone] and follow the prompts. ? ? ?For any non-urgent questions, you may also contact your provider using MyChart. We now offer e-Visits for anyone 16 and older to request care online for non-urgent symptoms. For details visit [email or web address]. ?  ?Also download the MyChart app! Go to the app store, search "MyChart", open the app, select [facility], and log in with your MyChart username and password. ? ?Due to Covid, a mask is required upon entering the hospital/clinic. If you do not have a mask, one will be given to you upon arrival. For doctor visits, patients may have 1 support person aged 54 or older with them. For treatment visits, patients cannot have anyone with them due to current Covid guidelines and our immunocompromised population.  ? ?

## 2022-02-17 NOTE — Telephone Encounter (Signed)
This RN left voicemail regarding appointment scheduled for 0930 this morning. Patient was instructed to call infusion clinic if she is unable to make appointment today. ?

## 2022-02-17 NOTE — Progress Notes (Signed)
? ?Patient Care Team: ?[name], NP as PCP - General (Adult Health Nurse Practitioner) ?Rockwell Germany, RN as Oncology Nurse Navigator ?Mauro Kaufmann, RN as Oncology Nurse Navigator ?Coralie Keens, MD as Consulting Physician (General Surgery) ?Nicholas Lose, MD as Consulting Physician (Hematology and Oncology) ?Gery Pray, MD as Consulting Physician (Radiation Oncology) ? ?DIAGNOSIS:  ?No diagnosis found. ? ? ?SUMMARY OF ONCOLOGIC HISTORY: ?Oncology History  ?Malignant neoplasm of upper-outer quadrant of right female breast ([place])  ?11/19/2021 Initial Diagnosis  ? Palpable lump for 3 weeks, diagnostic mammogram: 2.2 cm x 2.2 cm irregular mass with a spiculated margin in the upper outer right breast. 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 HER2 positive, Ki-67 15% ?  ?12/02/2021 Cancer Staging  ? Staging form: Breast, AJCC 8th Edition ?- Clinical stage from 12/02/2021: Stage IB (cT2, cN0, cM0, G2, ER+, PR+, HER2+) - Signed by Nicholas Lose, MD on 12/02/2021 ?Stage prefix: Initial diagnosis ?Histologic grading system: 3 grade system ? ?  ?12/16/2021 -  Chemotherapy  ? Patient is on Treatment Plan : BREAST  Docetaxel + Carboplatin + Trastuzumab + Pertuzumab  (TCHP) q21d   ?   ?01/22/2022 Genetic Testing  ? Negative hereditary cancer genetic testing: no pathogenic variants detected in [facility] +RNAinsight Panel.  Report date is 01/22/2022.  ? ?The CancerNext gene panel offered by [employer] includes sequencing, rearrangement analysis, and RNA analysis for the following 36 genes:   APC, ATM, AXIN2, BARD1, BMPR1A, BRCA1, BRCA2, BRIP1, CDH1, CDK4, CDKN2A, CHEK2, DICER1, HOXB13, EPCAM, GREM1, MLH1, MSH2, MSH3, MSH6, MUTYH, NBN, NF1, NTHL1, PALB2, PMS2, POLD1, POLE, PTEN, RAD51C, RAD51D, RECQL, SMAD4, SMARCA4, STK11, and TP53.  ?  ? ? ?CHIEF COMPLIANT: Cycle 4 TCHP ? ?INTERVAL HISTORY: Julie Atkinson is a  ?51 y.o. with above-mentioned history of right breast cancer, currently on chemotherapy  with TCHP. ?She is doing well overall. Diarrhea is bothersome, about 5 bowel movements on an average, not taking imodium ?Lomotil works but she hasn't been taking this either. ?She also noticed some leg swelling bilateral lower extremities ?Mouth sores vs thrush, she takes fluconazole for thrush.  ?For the first 5 days, she does not have much taste and it resolves after that. ?She was wondering if her tumor in her breast continues to respond. ?Rest of the pertinent 10 point ROS reviewed and negative ? ?ALLERGIES:  is allergic to codeine. ? ?MEDICATIONS:  ?Current Outpatient Medications  ?Medication Sig Dispense Refill  ? FLUoxetine (PROZAC) 20 MG capsule TAKE 1 CAPSULE(20 MG) BY MOUTH DAILY 90 capsule 3  ? amoxicillin-clavulanate (AUGMENTIN) 875-125 MG tablet Take 1 tablet by mouth 2 (two) times daily. 14 tablet 0  ? clonazePAM (KLONOPIN) 0.5 MG disintegrating tablet Take 1 tablet (0.5 mg total) by mouth 2 (two) times daily. 30 tablet 0  ? dexamethasone (DECADRON) 4 MG tablet Take 1 tablet (4 mg total) by mouth daily. Take 1 tablet day before chemo and 1 tablet day after chemo with food 12 tablet 0  ? diphenoxylate-atropine (LOMOTIL) 2.5-0.025 MG tablet Take 1 tablet by mouth 3 (three) times daily as needed for diarrhea or loose stools. 60 tablet 3  ? fluconazole (DIFLUCAN) 100 MG tablet Take 1 tablet (100 mg total) by mouth daily. 7 tablet 1  ? lidocaine-prilocaine (EMLA) cream Apply to affected area once 30 g 3  ? LORazepam (ATIVAN) 0.5 MG tablet Take 1 tablet (0.5 mg total) by mouth at bedtime as needed for anxiety. 30 tablet 0  ? magic mouthwash w/lidocaine  SOLN Take 5 mLs by mouth 3 (three) times daily as needed for mouth pain. 240 mL 2  ? ondansetron (ZOFRAN) 8 MG tablet Take 1 tablet (8 mg total) by mouth 2 (two) times daily as needed (Nausea or vomiting). Start on the third day after chemotherapy. 30 tablet 1  ? prochlorperazine (COMPAZINE) 10 MG tablet Take 1 tablet (10 mg total) by mouth every 6 (six)  hours as needed (Nausea or vomiting). 30 tablet 1  ? traMADol (ULTRAM) 50 MG tablet Take 1 tablet (50 mg total) by mouth every 6 (six) hours as needed. 20 tablet 0  ? ?No current facility-administered medications for this visit.  ? ?Facility-Administered Medications Ordered in Other Visits  ?Medication Dose Route Frequency Provider Last Rate Last Admin  ? CARBOplatin (PARAPLATIN) 700 mg in sodium chloride 0.9 % 250 mL chemo infusion  700 mg Intravenous Once Nicholas Lose, MD      ? heparin lock flush 100 unit/mL  500 Units Intracatheter Once PRN Nicholas Lose, MD      ? sodium chloride flush (NS) 0.9 % injection 10 mL  10 mL Intracatheter PRN Nicholas Lose, MD      ? ? ?PHYSICAL EXAMINATION: ?ECOG PERFORMANCE STATUS: 1 - Symptomatic but completely ambulatory ? ?There were no vitals filed for this visit. ? ?There were no vitals filed for this visit. ? ?Physical Exam ?Constitutional:   ?   Appearance: Normal appearance.  ?Cardiovascular:  ?   Rate and Rhythm: Normal rate and regular rhythm.  ?   Pulses: Normal pulses.  ?   Heart sounds: Normal heart sounds.  ?Chest:  ?   Comments: Bilateral breasts inspected.  Right retroareolar mass has significantly reduced compared to the initial size.  Right axillary lymphadenopathy not palpable.  Overall consistent with remarkable response. ?Musculoskeletal:     ?   General: Swelling present. No tenderness. Normal range of motion.  ?   Cervical back: Normal range of motion and neck supple. No rigidity.  ?Lymphadenopathy:  ?   Cervical: No cervical adenopathy.  ?Skin: ?   General: Skin is warm and dry.  ?Neurological:  ?   General: No focal deficit present.  ?   Mental Status: She is alert.  ? ?  ? ?LABORATORY DATA:  ?I have reviewed the data as listed ? ?  Latest Ref Rng & Units 02/17/2022  ? 10:09 AM 01/27/2022  ? 10:57 AM 01/12/2022  ? 12:32 PM  ?CMP  ?Glucose 70 - 99 mg/dL 77   87   91    ?BUN 6 - 20 mg/dL 9   9   7     ?Creatinine 0.44 - 1.00 mg/dL 0.61   0.71   0.59    ?Sodium  135 - 145 mmol/L 137   135   133    ?Potassium 3.5 - 5.1 mmol/L 3.7   3.4   4.2    ?Chloride 98 - 111 mmol/L 107   103   101    ?CO2 22 - 32 mmol/L 25   25   25     ?Calcium 8.9 - 10.3 mg/dL 8.5   8.9   9.8    ?Total Protein 6.5 - 8.1 g/dL 6.5   6.7   7.3    ?Total Bilirubin 0.3 - 1.2 mg/dL 0.4   0.4   0.6    ?Alkaline Phos 38 - 126 U/L 65   70   68    ?AST 15 - 41 U/L 23  17   18    ?ALT 0 - 44 U/L 20   17   24     ? ? ?Lab Results  ?Component Value Date  ? WBC 3.9 (L) 02/17/2022  ? HGB 11.2 (L) 02/17/2022  ? HCT 33.1 (L) 02/17/2022  ? MCV 97.6 02/17/2022  ? PLT 205 02/17/2022  ? NEUTROABS 1.6 (L) 02/17/2022  ? ? ?ASSESSMENT & PLAN:  ? ?Malignant neoplasm of upper-outer quadrant of right female breast ([place]) ?11/19/2021:Palpable lump for 3 weeks, diagnostic mammogram: 2.2 cm x 2.2 cm irregular mass with a spiculated margin in the upper outer right breast. 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 HER2 positive, Ki-67 15% ?MRI Breast 12/09/21:  Right Breast retroareolar mass 6.7 cm (involves nipple areolar complex), Rt axilla 2.1 cm node, Left breast: Several scattered foci left breast indeterminate 0.6 cm mass LIQ left breast and another indeterminate 0.6 cm mass in upper central Left breast (biopsies recommended: rt axillary LN, Left Breast indeterminate mass) ?  ?Treatment Plan: ?1. Neoadjuvant chemotherapy with TCHP foll by HP vs Kadcyla maintenance ?2. Followed by breast conserving surgery with sentinel lymph node study vs targeted axillary dissection ?3. Followed by adjuvant radiation therapy ?------------------------------------------------------------------------------------------------------------------------------------ ?Current Treatment: Cycle 4 TCHP ?Chemo toxicities: ?Diarrhea: Intermittently.  Approximately 5 loose stools per day, encouraged her to use the Imodium, Lomotil has been making her sleepy. ?Bilateral lower extremity edema, likely secondary to taxane use, will continue to monitor.  Consider  dose reduction if this persists or worsens.   ?Thrush from chemotherapy, okay to use fluconazole or nystatin ?Grade 1 neuropathy, intermittent and mild, she has been using ice packs and some compression glov

## 2022-02-19 ENCOUNTER — Inpatient Hospital Stay: Payer: BC Managed Care – PPO

## 2022-02-19 ENCOUNTER — Other Ambulatory Visit: Payer: Self-pay

## 2022-02-19 DIAGNOSIS — Z5112 Encounter for antineoplastic immunotherapy: Secondary | ICD-10-CM | POA: Diagnosis not present

## 2022-02-19 DIAGNOSIS — Z170 Estrogen receptor positive status [ER+]: Secondary | ICD-10-CM | POA: Diagnosis not present

## 2022-02-19 DIAGNOSIS — Z5111 Encounter for antineoplastic chemotherapy: Secondary | ICD-10-CM | POA: Diagnosis not present

## 2022-02-19 DIAGNOSIS — Z5189 Encounter for other specified aftercare: Secondary | ICD-10-CM | POA: Diagnosis not present

## 2022-02-19 DIAGNOSIS — C50411 Malignant neoplasm of upper-outer quadrant of right female breast: Secondary | ICD-10-CM | POA: Diagnosis not present

## 2022-02-19 MED ORDER — PEGFILGRASTIM-CBQV 6 MG/0.6ML ~~LOC~~ SOSY
6.0000 mg | PREFILLED_SYRINGE | Freq: Once | SUBCUTANEOUS | Status: AC
  Administered 2022-02-19: 6 mg via SUBCUTANEOUS
  Filled 2022-02-19: qty 0.6

## 2022-02-19 NOTE — Patient Instructions (Signed)

## 2022-02-22 ENCOUNTER — Encounter: Payer: Self-pay | Admitting: Hematology and Oncology

## 2022-02-24 ENCOUNTER — Encounter: Payer: Self-pay | Admitting: Hematology and Oncology

## 2022-02-24 ENCOUNTER — Telehealth: Payer: Self-pay

## 2022-02-24 NOTE — Telephone Encounter (Signed)
Pt called and states she started having throat/esophageal pain Saturday. States now her voice is hoarse, throat slightly swollen and is concerned she had thrush so she started taking Fluconazole but it worsened. Advised pt to use magic mouthwash swish & swallow, and if that does not work, try liquid benadryl and pepcid. Advised pt to call back tomorrow if not better. Will f/u on pt. ?

## 2022-02-24 NOTE — Progress Notes (Signed)
? ?Patient Care Team: ?[name], NP as PCP - General (Adult Health Nurse Practitioner) ?Rockwell Germany, RN as Oncology Nurse Navigator ?Mauro Kaufmann, RN as Oncology Nurse Navigator ?Coralie Keens, MD as Consulting Physician (General Surgery) ?Nicholas Lose, MD as Consulting Physician (Hematology and Oncology) ?Gery Pray, MD as Consulting Physician (Radiation Oncology) ? ?DIAGNOSIS:  ?Encounter Diagnosis  ?Name Primary?  ? Malignant neoplasm of upper-outer quadrant of right breast in female, estrogen receptor positive ([place])   ? ? ?SUMMARY OF ONCOLOGIC HISTORY: ?Oncology History  ?Malignant neoplasm of upper-outer quadrant of right female breast ([place])  ?11/19/2021 Initial Diagnosis  ? Palpable lump for 3 weeks, diagnostic mammogram: 2.2 cm x 2.2 cm irregular mass with a spiculated margin in the upper outer right breast. 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 HER2 positive, Ki-67 15% ?  ?12/02/2021 [facility] Staging  ? Staging form: Breast, AJCC 8th Edition ?- Clinical stage from 12/02/2021: Stage IB (cT2, cN0, cM0, G2, ER+, PR+, HER2+) - Signed by Nicholas Lose, MD on 12/02/2021 ?Stage prefix: Initial diagnosis ?Histologic grading system: 3 grade system ? ?  ?12/16/2021 -  Chemotherapy  ? Patient is on Treatment Plan : BREAST  Docetaxel + Carboplatin + Trastuzumab + Pertuzumab  (TCHP) q21d   ? ?  ?  ?01/22/2022 Genetic Testing  ? Negative hereditary [facility] genetic testing: no pathogenic variants detected in [facility] +RNAinsight Panel.  Report date is 01/22/2022.  ? ?The CancerNext gene panel offered by [employer] includes sequencing, rearrangement analysis, and RNA analysis for the following 36 genes:   APC, ATM, AXIN2, BARD1, BMPR1A, BRCA1, BRCA2, BRIP1, CDH1, CDK4, CDKN2A, CHEK2, DICER1, HOXB13, EPCAM, GREM1, MLH1, MSH2, MSH3, MSH6, MUTYH, NBN, NF1, NTHL1, PALB2, PMS2, POLD1, POLE, PTEN, RAD51C, RAD51D, RECQL, SMAD4, SMARCA4, STK11, and TP53.  ?  ? ? ?CHIEF COMPLIANT: Cycle 5   TCHP ? ?INTERVAL HISTORY: Julie Atkinson is a 51 y.o. with above-mentioned history of right breast Atkinson, currently on chemotherapy with TCHP. She presents to the clinic today for treatment. She state they her fingers is numb. She state the tips of fingers feel like they sleep. She state that she fell 3 times. She don't know how its happening. She feels like she blacks out. She state that she had reflux and she lost her voice for 2 days. She complains of not able to eat because it burns, feels like acid. She still able to do things like button her shirt and pick up things, So neuropathy is under control. She states that she still having diarrhea. She state that she has been having muscle cramps mainly in the back of her legs, feels very tight. ? ? ?ALLERGIES: is allergic to codeine. ? ?MEDICATIONS:  ?Current Outpatient Medications  ?Medication Sig Dispense Refill  ? clonazePAM (KLONOPIN) 0.5 MG disintegrating tablet Take 1 tablet (0.5 mg total) by mouth 2 (two) times daily. 30 tablet 0  ? dexamethasone (DECADRON) 4 MG tablet Take 1 tablet (4 mg total) by mouth daily. Take 1 tablet day before chemo and 1 tablet day after chemo with food 12 tablet 0  ? diphenoxylate-atropine (LOMOTIL) 2.5-0.025 MG tablet Take 1 tablet by mouth 3 (three) times daily as needed for diarrhea or loose stools. 60 tablet 3  ? fluconazole (DIFLUCAN) 100 MG tablet Take 1 tablet (100 mg total) by mouth daily. 7 tablet 1  ? lidocaine-prilocaine (EMLA) cream Apply to affected area once 30 g 3  ? LORazepam (ATIVAN) 0.5 MG tablet Take 1 tablet (0.5  mg total) by mouth at bedtime as needed for anxiety. 30 tablet 0  ? magic mouthwash w/lidocaine SOLN Take 5 mLs by mouth 3 (three) times daily as needed for mouth pain. 240 mL 2  ? ondansetron (ZOFRAN) 8 MG tablet Take 1 tablet (8 mg total) by mouth 2 (two) times daily as needed (Nausea or vomiting). Start on the third day after chemotherapy. 30 tablet 1  ? prochlorperazine (COMPAZINE) 10 MG tablet Take  1 tablet (10 mg total) by mouth every 6 (six) hours as needed (Nausea or vomiting). 30 tablet 1  ? ?No current facility-administered medications for this visit.  ? ?Facility-Administered Medications Ordered in Other Visits  ?Medication Dose Route Frequency Provider Last Rate Last Admin  ? CARBOplatin (PARAPLATIN) 600 mg in sodium chloride 0.9 % 250 mL chemo infusion  600 mg Intravenous Once Nicholas Lose, MD      ? DOCEtaxel (TAXOTERE) 100 mg in sodium chloride 0.9 % 250 mL chemo infusion  50 mg/m2 (Treatment Plan Recorded) Intravenous Once Nicholas Lose, MD      ? heparin lock flush 100 unit/mL  500 Units Intracatheter Once PRN Nicholas Lose, MD      ? pertuzumab (PERJETA) 420 mg in sodium chloride 0.9 % 250 mL chemo infusion  420 mg Intravenous Once Nicholas Lose, MD      ? sodium chloride flush (NS) 0.9 % injection 10 mL  10 mL Intracatheter PRN Nicholas Lose, MD      ? trastuzumab-dkst (OGIVRI) 525 mg in sodium chloride 0.9 % 250 mL chemo infusion  6 mg/kg (Treatment Plan Recorded) Intravenous Once Nicholas Lose, MD 550 mL/hr at 03/10/22 1223 525 mg at 03/10/22 1223  ? ? ?PHYSICAL EXAMINATION: ?ECOG PERFORMANCE STATUS: 1 - Symptomatic but completely ambulatory ? ?Vitals:  ? 03/10/22 0957  ?BP: (!) 156/98  ?Pulse: 71  ?Resp: 18  ?Temp: 97.8 ?F (36.6 ?C)  ?SpO2: 100%  ? ?Filed Weights  ? 03/10/22 0957  ?Weight: 186 lb (84.4 kg)  ?  ? ?LABORATORY DATA:  ?I have reviewed the data as listed ? ?  Latest Ref Rng & Units 03/10/2022  ?  9:44 AM 02/17/2022  ? 10:09 AM 01/27/2022  ? 10:57 AM  ?CMP  ?Glucose 70 - 99 mg/dL 138   77   87    ?BUN 6 - 20 mg/dL 11   9   9     ?Creatinine 0.44 - 1.00 mg/dL 0.68   0.61   0.71    ?Sodium 135 - 145 mmol/L 136   137   135    ?Potassium 3.5 - 5.1 mmol/L 3.7   3.7   3.4    ?Chloride 98 - 111 mmol/L 105   107   103    ?CO2 22 - 32 mmol/L 21   25   25     ?Calcium 8.9 - 10.3 mg/dL 9.1   8.5   8.9    ?Total Protein 6.5 - 8.1 g/dL 6.8   6.5   6.7    ?Total Bilirubin 0.3 - 1.2 mg/dL 0.5   0.4    0.4    ?Alkaline Phos 38 - 126 U/L 62   65   70    ?AST 15 - 41 U/L 24   23   17     ?ALT 0 - 44 U/L 18   20   17     ? ? ?Lab Results  ?Component Value Date  ? WBC 8.9 03/10/2022  ? HGB 11.3 (L) 03/10/2022  ?  HCT 33.2 (L) 03/10/2022  ? MCV 100.6 (H) 03/10/2022  ? PLT 212 03/10/2022  ? NEUTROABS 8.1 (H) 03/10/2022  ? ? ?ASSESSMENT & PLAN:  ?Malignant neoplasm of upper-outer quadrant of right female breast ([place]) ?11/19/2021:Palpable lump for 3 weeks, diagnostic mammogram: 2.2 cm x 2.2 cm irregular mass with a spiculated margin in the upper outer right breast. 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 HER2 positive, Ki-67 15% ?MRI Breast 12/09/21:  Right Breast retroareolar mass 6.7 cm (involves nipple areolar complex), Rt axilla 2.1 cm node, Left breast: Several scattered foci left breast indeterminate 0.6 cm mass LIQ left breast and another indeterminate 0.6 cm mass in upper central Left breast (biopsies recommended: rt axillary LN, Left Breast indeterminate mass) ?  ?Treatment Plan: ?1. Neoadjuvant chemotherapy with TCHP foll by [facility] vs Kadcyla maintenance ?2. Followed by breast conserving surgery with sentinel lymph node study vs targeted axillary dissection ?3. Followed by adjuvant radiation therapy ?------------------------------------------------------------------------------------------------------------------------------------ ?Current Treatment: Cycle 5 TCHP (dose has been reduced) ?Chemo toxicities: ?Diarrhea: Intermittently.  3-5 loose stools per day.  She does not like to take medications but she is taking Lomotil on and off. ?Chemotherapy-induced anemia: Monitoring closely ?Mild nausea ?Severe fatigue ?Rash on the face: Not related to chemotherapy has improved. ?Chemo-induced peripheral neuropathy: Neuropathy is up to the middle of her fingers.  Therefore reduce the Taxotere to 50 mg per metered squared and carboplatin down as well. ?  ?Breast MRI 02/26/2022: Very good response to the right breast Atkinson  measuring 0.7 cm, non-mass enhancement much less intense (previously was 7 cm), decrease in the right axillary lymph node ? ?RTC in 3 weeks for cycle 6 ? ?No orders of the defined types were placed in this enc

## 2022-02-25 ENCOUNTER — Encounter: Payer: Self-pay | Admitting: Hematology and Oncology

## 2022-02-26 ENCOUNTER — Ambulatory Visit
Admission: RE | Admit: 2022-02-26 | Discharge: 2022-02-26 | Disposition: A | Discharge status: Home / Self Care | Payer: BC Managed Care – PPO | Source: Ambulatory Visit | Attending: Hematology and Oncology | Admitting: Hematology and Oncology

## 2022-02-26 ENCOUNTER — Other Ambulatory Visit: Payer: Self-pay | Admitting: Radiation Therapy

## 2022-02-26 DIAGNOSIS — Z853 Personal history of malignant neoplasm of breast: Secondary | ICD-10-CM

## 2022-02-26 DIAGNOSIS — C50912 Malignant neoplasm of unspecified site of left female breast: Secondary | ICD-10-CM | POA: Diagnosis not present

## 2022-02-26 DIAGNOSIS — C50411 Malignant neoplasm of upper-outer quadrant of right female breast: Secondary | ICD-10-CM

## 2022-02-26 MED ORDER — GADOBUTROL 1 MMOL/ML IV SOLN
8.0000 mL | Freq: Once | INTRAVENOUS | Status: AC | PRN
  Administered 2022-02-26: 8 mL via INTRAVENOUS

## 2022-02-26 MED ORDER — SODIUM CHLORIDE 0.9% FLUSH
10.0000 mL | INTRAVENOUS | Status: DC | PRN
  Administered 2022-02-26: 10 mL via INTRAVENOUS

## 2022-02-26 MED ORDER — HEPARIN SOD (PORK) LOCK FLUSH 100 UNIT/ML IV SOLN
500.0000 [IU] | Freq: Once | INTRAVENOUS | Status: AC
  Administered 2022-02-26: 500 [IU] via INTRAVENOUS

## 2022-02-26 NOTE — Progress Notes (Signed)
Port orders entered for access the day of MRI at [facility]  ?

## 2022-03-01 ENCOUNTER — Telehealth: Payer: Self-pay | Admitting: Hematology and Oncology

## 2022-03-01 NOTE — Telephone Encounter (Signed)
I reviewed the breast MRI and call the patient to discuss the results.  I left a voicemail for her to call us back tomorrow ?

## 2022-03-09 ENCOUNTER — Other Ambulatory Visit: Payer: Self-pay | Admitting: Surgery

## 2022-03-09 DIAGNOSIS — C50919 Malignant neoplasm of unspecified site of unspecified female breast: Secondary | ICD-10-CM | POA: Diagnosis not present

## 2022-03-10 ENCOUNTER — Inpatient Hospital Stay (HOSPITAL_BASED_OUTPATIENT_CLINIC_OR_DEPARTMENT_OTHER): Payer: BC Managed Care – PPO | Admitting: Hematology and Oncology

## 2022-03-10 ENCOUNTER — Inpatient Hospital Stay: Payer: BC Managed Care – PPO | Attending: Hematology and Oncology

## 2022-03-10 ENCOUNTER — Other Ambulatory Visit: Payer: Self-pay | Admitting: *Deleted

## 2022-03-10 ENCOUNTER — Other Ambulatory Visit: Payer: Self-pay | Admitting: Hematology and Oncology

## 2022-03-10 ENCOUNTER — Other Ambulatory Visit: Payer: Self-pay

## 2022-03-10 ENCOUNTER — Inpatient Hospital Stay: Payer: BC Managed Care – PPO

## 2022-03-10 VITALS — BP 125/79 | HR 65 | Temp 98.7°F | Resp 18

## 2022-03-10 DIAGNOSIS — G620 Drug-induced polyneuropathy: Secondary | ICD-10-CM | POA: Diagnosis not present

## 2022-03-10 DIAGNOSIS — Z5189 Encounter for other specified aftercare: Secondary | ICD-10-CM | POA: Insufficient documentation

## 2022-03-10 DIAGNOSIS — R197 Diarrhea, unspecified: Secondary | ICD-10-CM | POA: Insufficient documentation

## 2022-03-10 DIAGNOSIS — Z5112 Encounter for antineoplastic immunotherapy: Secondary | ICD-10-CM | POA: Diagnosis not present

## 2022-03-10 DIAGNOSIS — D6481 Anemia due to antineoplastic chemotherapy: Secondary | ICD-10-CM | POA: Diagnosis not present

## 2022-03-10 DIAGNOSIS — T451X5A Adverse effect of antineoplastic and immunosuppressive drugs, initial encounter: Secondary | ICD-10-CM | POA: Insufficient documentation

## 2022-03-10 DIAGNOSIS — C50411 Malignant neoplasm of upper-outer quadrant of right female breast: Secondary | ICD-10-CM

## 2022-03-10 DIAGNOSIS — Z170 Estrogen receptor positive status [ER+]: Secondary | ICD-10-CM | POA: Diagnosis not present

## 2022-03-10 DIAGNOSIS — Z5111 Encounter for antineoplastic chemotherapy: Secondary | ICD-10-CM | POA: Diagnosis not present

## 2022-03-10 DIAGNOSIS — Z5181 Encounter for therapeutic drug level monitoring: Secondary | ICD-10-CM

## 2022-03-10 DIAGNOSIS — Z95828 Presence of other vascular implants and grafts: Secondary | ICD-10-CM

## 2022-03-10 LAB — CBC WITH DIFFERENTIAL (CANCER CENTER ONLY)
Abs Immature Granulocytes: 0.03 10*3/uL (ref 0.00–0.07)
Basophils Absolute: 0 10*3/uL (ref 0.0–0.1)
Basophils Relative: 0 %
Eosinophils Absolute: 0 10*3/uL (ref 0.0–0.5)
Eosinophils Relative: 0 %
HCT: 33.2 % — ABNORMAL LOW (ref 36.0–46.0)
Hemoglobin: 11.3 g/dL — ABNORMAL LOW (ref 12.0–15.0)
Immature Granulocytes: 0 %
Lymphocytes Relative: 6 %
Lymphs Abs: 0.5 10*3/uL — ABNORMAL LOW (ref 0.7–4.0)
MCH: 34.2 pg — ABNORMAL HIGH (ref 26.0–34.0)
MCHC: 34 g/dL (ref 30.0–36.0)
MCV: 100.6 fL — ABNORMAL HIGH (ref 80.0–100.0)
Monocytes Absolute: 0.2 10*3/uL (ref 0.1–1.0)
Monocytes Relative: 2 %
Neutro Abs: 8.1 10*3/uL — ABNORMAL HIGH (ref 1.7–7.7)
Neutrophils Relative %: 92 %
Platelet Count: 212 10*3/uL (ref 150–400)
RBC: 3.3 MIL/uL — ABNORMAL LOW (ref 3.87–5.11)
RDW: 15 % (ref 11.5–15.5)
WBC Count: 8.9 10*3/uL (ref 4.0–10.5)
nRBC: 0 % (ref 0.0–0.2)

## 2022-03-10 LAB — CMP (CANCER CENTER ONLY)
ALT: 18 U/L (ref 0–44)
AST: 24 U/L (ref 15–41)
Albumin: 3.7 g/dL (ref 3.5–5.0)
Alkaline Phosphatase: 62 U/L (ref 38–126)
Anion gap: 10 (ref 5–15)
BUN: 11 mg/dL (ref 6–20)
CO2: 21 mmol/L — ABNORMAL LOW (ref 22–32)
Calcium: 9.1 mg/dL (ref 8.9–10.3)
Chloride: 105 mmol/L (ref 98–111)
Creatinine: 0.68 mg/dL (ref 0.44–1.00)
GFR, Estimated: >60 mL/min (ref >60)
Glucose, Bld: 138 mg/dL — ABNORMAL HIGH (ref 70–99)
Potassium: 3.7 mmol/L (ref 3.5–5.1)
Sodium: 136 mmol/L (ref 135–145)
Total Bilirubin: 0.5 mg/dL (ref 0.3–1.2)
Total Protein: 6.8 g/dL (ref 6.5–8.1)

## 2022-03-10 MED ORDER — SODIUM CHLORIDE 0.9 % IV SOLN
600.0000 mg | Freq: Once | INTRAVENOUS | Status: AC
  Administered 2022-03-10: 600 mg via INTRAVENOUS
  Filled 2022-03-10: qty 60

## 2022-03-10 MED ORDER — PALONOSETRON HCL INJECTION 0.25 MG/5ML
0.2500 mg | Freq: Once | INTRAVENOUS | Status: AC
  Administered 2022-03-10: 0.25 mg via INTRAVENOUS
  Filled 2022-03-10: qty 5

## 2022-03-10 MED ORDER — SODIUM CHLORIDE 0.9% FLUSH
10.0000 mL | INTRAVENOUS | Status: DC | PRN
  Administered 2022-03-10: 10 mL

## 2022-03-10 MED ORDER — ACETAMINOPHEN 325 MG PO TABS
650.0000 mg | ORAL_TABLET | Freq: Once | ORAL | Status: AC
  Administered 2022-03-10: 650 mg via ORAL
  Filled 2022-03-10: qty 2

## 2022-03-10 MED ORDER — HEPARIN SOD (PORK) LOCK FLUSH 100 UNIT/ML IV SOLN
500.0000 [IU] | Freq: Once | INTRAVENOUS | Status: AC | PRN
  Administered 2022-03-10: 500 [IU]

## 2022-03-10 MED ORDER — SODIUM CHLORIDE 0.9 % IV SOLN
10.0000 mg | Freq: Once | INTRAVENOUS | Status: AC
  Administered 2022-03-10: 10 mg via INTRAVENOUS
  Filled 2022-03-10: qty 10

## 2022-03-10 MED ORDER — DIPHENHYDRAMINE HCL 25 MG PO CAPS
50.0000 mg | ORAL_CAPSULE | Freq: Once | ORAL | Status: AC
  Administered 2022-03-10: 50 mg via ORAL
  Filled 2022-03-10: qty 2

## 2022-03-10 MED ORDER — TRASTUZUMAB-DKST CHEMO 150 MG IV SOLR
6.0000 mg/kg | Freq: Once | INTRAVENOUS | Status: AC
  Administered 2022-03-10: 525 mg via INTRAVENOUS
  Filled 2022-03-10: qty 25

## 2022-03-10 MED ORDER — SODIUM CHLORIDE 0.9 % IV SOLN
50.0000 mg/m2 | Freq: Once | INTRAVENOUS | Status: AC
  Administered 2022-03-10: 100 mg via INTRAVENOUS
  Filled 2022-03-10: qty 10

## 2022-03-10 MED ORDER — SODIUM CHLORIDE 0.9% FLUSH
10.0000 mL | Freq: Once | INTRAVENOUS | Status: AC
  Administered 2022-03-10: 10 mL

## 2022-03-10 MED ORDER — SODIUM CHLORIDE 0.9 % IV SOLN
150.0000 mg | Freq: Once | INTRAVENOUS | Status: AC
  Administered 2022-03-10: 150 mg via INTRAVENOUS
  Filled 2022-03-10: qty 150

## 2022-03-10 MED ORDER — SODIUM CHLORIDE 0.9 % IV SOLN: Freq: Once | INTRAVENOUS | Status: AC

## 2022-03-10 MED ORDER — SODIUM CHLORIDE 0.9 % IV SOLN
420.0000 mg | Freq: Once | INTRAVENOUS | Status: AC
  Administered 2022-03-10: 420 mg via INTRAVENOUS
  Filled 2022-03-10: qty 14

## 2022-03-10 NOTE — Patient Instructions (Signed)
[facility]  Discharge Instructions: ?Thank you for choosing [facility] to provide your oncology and hematology care.  ? ?If you have a lab appointment with the [facility], please go directly to the [facility] and check in at the registration area. ?  ?Wear comfortable clothing and clothing appropriate for easy access to any Portacath or PICC line.  ? ?We strive to give you quality time with your provider. You may need to reschedule your appointment if you arrive late (15 or more minutes).  Arriving late affects you and other patients whose appointments are after yours.  Also, if you miss three or more appointments without notifying the office, you may be dismissed from the clinic at the provider?s discretion.    ?  ?For prescription refill requests, have your pharmacy contact our office and allow 72 hours for refills to be completed.   ? ?Today you received the following chemotherapy and/or immunotherapy agents trastuzumab, pertuzumab, paclitaxel, carboplatin    ?  ?To help prevent nausea and vomiting after your treatment, we encourage you to take your nausea medication as directed. ? ?BELOW ARE SYMPTOMS THAT SHOULD BE REPORTED IMMEDIATELY: ?*FEVER GREATER THAN 100.4 F (38 ?C) OR HIGHER ?*CHILLS OR SWEATING ?*NAUSEA AND VOMITING THAT IS NOT CONTROLLED WITH YOUR NAUSEA MEDICATION ?*UNUSUAL SHORTNESS OF BREATH ?*UNUSUAL BRUISING OR BLEEDING ?*URINARY PROBLEMS (pain or burning when urinating, or frequent urination) ?*BOWEL PROBLEMS (unusual diarrhea, constipation, pain near the anus) ?TENDERNESS IN MOUTH AND THROAT WITH OR WITHOUT PRESENCE OF ULCERS (sore throat, sores in mouth, or a toothache) ?UNUSUAL RASH, SWELLING OR PAIN  ?UNUSUAL VAGINAL DISCHARGE OR ITCHING  ? ?Items with * indicate a potential emergency and should be followed up as soon as possible or go to the Emergency Department if any problems should occur. ? ?Please show the CHEMOTHERAPY ALERT CARD or  IMMUNOTHERAPY ALERT CARD at check-in to the Emergency Department and triage nurse. ? ?Should you have questions after your visit or need to cancel or reschedule your appointment, please contact [facility]  Dept: [phone]  and follow the prompts.  Office hours are 8:00 a.m. to 4:30 p.m. Monday - Friday. Please note that voicemails left after 4:00 p.m. may not be returned until the following business day.  We are closed weekends and major holidays. You have access to a nurse at all times for urgent questions. Please call the main number to the clinic Dept: [phone] and follow the prompts. ? ? ?For any non-urgent questions, you may also contact your provider using MyChart. We now offer e-Visits for anyone 75 and older to request care online for non-urgent symptoms. For details visit [email or web address]. ?  ?Also download the MyChart app! Go to the app store, search "MyChart", open the app, select [facility], and log in with your MyChart username and password. ? ?Due to Covid, a mask is required upon entering the hospital/clinic. If you do not have a mask, one will be given to you upon arrival. For doctor visits, patients may have 1 support person aged 21 or older with them. For treatment visits, patients cannot have anyone with them due to current Covid guidelines and our immunocompromised population.  ? ?

## 2022-03-10 NOTE — Progress Notes (Signed)
Patient's ECHO was three months ago today- message sent verifying it was still appropriate for use and reminding staff that she needs a new appointment- ok to proceed today. Will schedule appointment per Merleen Nicely. ?

## 2022-03-10 NOTE — Assessment & Plan Note (Signed)
11/19/2021:Palpable lump for 3 weeks, diagnostic mammogram: 2.2 cm x 2.2 cm irregular mass with a spiculated margin in the upper outer right breast. 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 HER2 positive, Ki-67 15% ?MRI Breast 12/09/21: ?Right Breast retroareolar mass 6.7 cm (involves nipple areolar complex), Rt axilla 2.1 cm node, Left breast: Several scattered foci left breast indeterminate 0.6 cm mass LIQ left breast and another indeterminate 0.6 cm mass in upper central Left breast (biopsies recommended: rt axillary LN, Left Breast indeterminate mass) ?? ?Treatment Plan: ?1. Neoadjuvant chemotherapy with?TCHP foll by HP vs Kadcyla maintenance ?2. Followed by breast conserving surgery with sentinel lymph node study vs targeted axillary dissection ?3. Followed by adjuvant radiation therapy ?------------------------------------------------------------------------------------------------------------------------------------ ?Current Treatment: Cycle?5?TCHP ?Chemo toxicities: ?1. Diarrhea: Intermittently.  3-5 loose stools per day.  She does not like to take medications but she is taking Lomotil on and off. ?2. Chemotherapy-induced anemia: Monitoring closely ?3. Mild nausea ?4. Severe fatigue ?5. Rash on the face: Not related to chemotherapy has improved. ?? ?Breast MRI 02/26/2022: Very good response to the right breast cancer measuring 0.7 cm, non-mass enhancement much less intense (previously was 7 cm), decrease in the right axillary lymph node ? ?RTC in?3?weeks for cycle 6 ?

## 2022-03-12 ENCOUNTER — Inpatient Hospital Stay: Payer: BC Managed Care – PPO

## 2022-03-12 ENCOUNTER — Other Ambulatory Visit: Payer: Self-pay

## 2022-03-12 VITALS — BP 141/89 | HR 79 | Temp 99.1°F | Resp 20

## 2022-03-12 DIAGNOSIS — C50411 Malignant neoplasm of upper-outer quadrant of right female breast: Secondary | ICD-10-CM | POA: Diagnosis not present

## 2022-03-12 DIAGNOSIS — Z170 Estrogen receptor positive status [ER+]: Secondary | ICD-10-CM

## 2022-03-12 DIAGNOSIS — T451X5A Adverse effect of antineoplastic and immunosuppressive drugs, initial encounter: Secondary | ICD-10-CM | POA: Diagnosis not present

## 2022-03-12 DIAGNOSIS — Z5111 Encounter for antineoplastic chemotherapy: Secondary | ICD-10-CM | POA: Diagnosis not present

## 2022-03-12 DIAGNOSIS — R197 Diarrhea, unspecified: Secondary | ICD-10-CM | POA: Diagnosis not present

## 2022-03-12 DIAGNOSIS — Z5189 Encounter for other specified aftercare: Secondary | ICD-10-CM | POA: Diagnosis not present

## 2022-03-12 DIAGNOSIS — G620 Drug-induced polyneuropathy: Secondary | ICD-10-CM | POA: Diagnosis not present

## 2022-03-12 DIAGNOSIS — Z5112 Encounter for antineoplastic immunotherapy: Secondary | ICD-10-CM | POA: Diagnosis not present

## 2022-03-12 DIAGNOSIS — D6481 Anemia due to antineoplastic chemotherapy: Secondary | ICD-10-CM | POA: Diagnosis not present

## 2022-03-12 MED ORDER — PEGFILGRASTIM-CBQV 6 MG/0.6ML ~~LOC~~ SOSY
6.0000 mg | PREFILLED_SYRINGE | Freq: Once | SUBCUTANEOUS | Status: AC
  Administered 2022-03-12: 6 mg via SUBCUTANEOUS
  Filled 2022-03-12: qty 0.6

## 2022-03-16 ENCOUNTER — Ambulatory Visit (INDEPENDENT_AMBULATORY_CARE_PROVIDER_SITE_OTHER): Payer: BC Managed Care – PPO

## 2022-03-16 DIAGNOSIS — Z5181 Encounter for therapeutic drug level monitoring: Secondary | ICD-10-CM | POA: Diagnosis not present

## 2022-03-16 DIAGNOSIS — Z79899 Other long term (current) drug therapy: Secondary | ICD-10-CM | POA: Diagnosis not present

## 2022-03-17 LAB — ECHOCARDIOGRAM COMPLETE
AV Mean grad: 5 mmHg
AV Peak grad: 9.2 mmHg
AV Vena cont: 2 cm
Ao pk vel: 1.52 m/s
Area-P 1/2: 5.42 cm2
Calc EF: 68.9 %
S' Lateral: 2.82 cm
Single Plane A2C EF: 67.5 %
Single Plane A4C EF: 68 %

## 2022-03-25 ENCOUNTER — Inpatient Hospital Stay (HOSPITAL_BASED_OUTPATIENT_CLINIC_OR_DEPARTMENT_OTHER): Payer: BC Managed Care – PPO | Admitting: Adult Health

## 2022-03-25 ENCOUNTER — Encounter: Payer: Self-pay | Admitting: Hematology and Oncology

## 2022-03-25 VITALS — BP 135/90 | HR 73 | Temp 97.6°F | Resp 18 | Ht 68.0 in | Wt 185.4 lb

## 2022-03-25 DIAGNOSIS — L739 Follicular disorder, unspecified: Secondary | ICD-10-CM | POA: Diagnosis not present

## 2022-03-25 DIAGNOSIS — Z170 Estrogen receptor positive status [ER+]: Secondary | ICD-10-CM | POA: Diagnosis not present

## 2022-03-25 DIAGNOSIS — C50411 Malignant neoplasm of upper-outer quadrant of right female breast: Secondary | ICD-10-CM

## 2022-03-25 DIAGNOSIS — Z5111 Encounter for antineoplastic chemotherapy: Secondary | ICD-10-CM | POA: Diagnosis not present

## 2022-03-25 DIAGNOSIS — G620 Drug-induced polyneuropathy: Secondary | ICD-10-CM | POA: Diagnosis not present

## 2022-03-25 DIAGNOSIS — R197 Diarrhea, unspecified: Secondary | ICD-10-CM | POA: Diagnosis not present

## 2022-03-25 DIAGNOSIS — D6481 Anemia due to antineoplastic chemotherapy: Secondary | ICD-10-CM | POA: Diagnosis not present

## 2022-03-25 DIAGNOSIS — Z5112 Encounter for antineoplastic immunotherapy: Secondary | ICD-10-CM | POA: Diagnosis not present

## 2022-03-25 DIAGNOSIS — T451X5A Adverse effect of antineoplastic and immunosuppressive drugs, initial encounter: Secondary | ICD-10-CM | POA: Diagnosis not present

## 2022-03-25 DIAGNOSIS — Z5189 Encounter for other specified aftercare: Secondary | ICD-10-CM | POA: Diagnosis not present

## 2022-03-25 MED ORDER — CLINDAMYCIN PHOSPHATE 1 % EX GEL: Freq: Two times a day (BID) | CUTANEOUS | 0 refills | Status: DC

## 2022-03-25 MED ORDER — AMOXICILLIN-POT CLAVULANATE 875-125 MG PO TABS: 1.0000 | ORAL_TABLET | Freq: Two times a day (BID) | ORAL | 0 refills | Status: DC
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
## 2022-03-28 ENCOUNTER — Encounter: Payer: Self-pay | Admitting: Hematology and Oncology

## 2022-03-28 ENCOUNTER — Encounter: Payer: Self-pay | Admitting: Adult Health

## 2022-03-28 NOTE — Assessment & Plan Note (Signed)
11/19/2021:Palpable lump for 3 weeks, diagnostic mammogram: 2.2 cm x 2.2 cm irregular mass with a spiculated margin in the upper outer right breast. 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 HER2 positive, Ki-67 15% MRI Breast 12/09/21: Right Breast retroareolar mass 6.7 cm (involves nipple areolar complex), Rt axilla 2.1 cm node, Left breast: Several scattered foci left breast indeterminate 0.6 cm mass LIQ left breast and another indeterminate 0.6 cm mass in upper central Left breast (biopsies recommended: rt axillary LN, Left Breast indeterminate mass)  Treatment Plan: 1. Neoadjuvant chemotherapy withTCHP foll by [facility] vs Kadcyla maintenance 2. Followed by breast conserving surgery with sentinel lymph node study vs targeted axillary dissection 3. Followed by adjuvant radiation therapy ------------------------------------------------------------------------------------------------------------------------------------ Current Treatment: Cycle6TCHP due 03/31/2022  Breast MRI 02/26/2022: Very good response to the right breast cancer measuring 0.7 cm, non-mass enhancement much less intense (previously was 7 cm), decrease in the right axillary lymph node  I reviewed the MRI results above again with Julie Atkinson.  The area in her axilla that she is likely feeling is the lymph node.  Considering her good response to treatment, this is unlikely worsening of her cancer, or hardening of lymph nodes after continuing on therapy and radiographic improvement of disease.  She will continue on therapy, and will meet with surgery to discuss next steps in getting things scheduled once she completes her final cycle of neoadjuvant chemotherapy.

## 2022-03-29 ENCOUNTER — Telehealth: Payer: Self-pay | Admitting: *Deleted

## 2022-03-29 ENCOUNTER — Encounter: Payer: Self-pay | Admitting: Hematology and Oncology

## 2022-03-29 ENCOUNTER — Ambulatory Visit: Payer: BC Managed Care – PPO | Admitting: Hematology and Oncology

## 2022-03-29 NOTE — Telephone Encounter (Signed)
Spoke to pt regarding plastic surgeons that perform DIEP flap reconstruction. Pt interested in saving her nipple if possible. Pt will call back with provider decision. Scheduled app to be seen by Dr Lindi Adie on 5/24 of last tx. No further needs voiced at this time.

## 2022-03-30 MED FILL — Dexamethasone Sodium Phosphate Inj 100 MG/10ML: INTRAMUSCULAR | Qty: 1 | Status: AC

## 2022-03-31 ENCOUNTER — Encounter: Payer: Self-pay | Admitting: *Deleted

## 2022-03-31 ENCOUNTER — Inpatient Hospital Stay: Payer: BC Managed Care – PPO

## 2022-03-31 ENCOUNTER — Inpatient Hospital Stay (HOSPITAL_BASED_OUTPATIENT_CLINIC_OR_DEPARTMENT_OTHER): Payer: BC Managed Care – PPO | Admitting: Hematology and Oncology

## 2022-03-31 ENCOUNTER — Other Ambulatory Visit: Payer: Self-pay

## 2022-03-31 ENCOUNTER — Other Ambulatory Visit: Payer: BC Managed Care – PPO

## 2022-03-31 ENCOUNTER — Ambulatory Visit: Payer: BC Managed Care – PPO | Admitting: Adult Health

## 2022-03-31 VITALS — BP 138/84 | HR 70 | Temp 97.2°F | Resp 17 | Ht 68.0 in | Wt 182.4 lb

## 2022-03-31 DIAGNOSIS — Z170 Estrogen receptor positive status [ER+]: Secondary | ICD-10-CM | POA: Diagnosis not present

## 2022-03-31 DIAGNOSIS — Z5189 Encounter for other specified aftercare: Secondary | ICD-10-CM | POA: Diagnosis not present

## 2022-03-31 DIAGNOSIS — T451X5A Adverse effect of antineoplastic and immunosuppressive drugs, initial encounter: Secondary | ICD-10-CM | POA: Diagnosis not present

## 2022-03-31 DIAGNOSIS — Z5111 Encounter for antineoplastic chemotherapy: Secondary | ICD-10-CM | POA: Diagnosis not present

## 2022-03-31 DIAGNOSIS — C50411 Malignant neoplasm of upper-outer quadrant of right female breast: Secondary | ICD-10-CM

## 2022-03-31 DIAGNOSIS — Z95828 Presence of other vascular implants and grafts: Secondary | ICD-10-CM

## 2022-03-31 DIAGNOSIS — Z5112 Encounter for antineoplastic immunotherapy: Secondary | ICD-10-CM | POA: Diagnosis not present

## 2022-03-31 DIAGNOSIS — G620 Drug-induced polyneuropathy: Secondary | ICD-10-CM | POA: Diagnosis not present

## 2022-03-31 DIAGNOSIS — D6481 Anemia due to antineoplastic chemotherapy: Secondary | ICD-10-CM | POA: Diagnosis not present

## 2022-03-31 DIAGNOSIS — R197 Diarrhea, unspecified: Secondary | ICD-10-CM | POA: Diagnosis not present

## 2022-03-31 LAB — CMP (CANCER CENTER ONLY)
ALT: 14 U/L (ref 0–44)
AST: 18 U/L (ref 15–41)
Albumin: 4.4 g/dL (ref 3.5–5.0)
Alkaline Phosphatase: 67 U/L (ref 38–126)
Anion gap: 7 (ref 5–15)
BUN: 8 mg/dL (ref 6–20)
CO2: 25 mmol/L (ref 22–32)
Calcium: 9.4 mg/dL (ref 8.9–10.3)
Chloride: 103 mmol/L (ref 98–111)
Creatinine: 0.67 mg/dL (ref 0.44–1.00)
GFR, Estimated: >60 mL/min (ref >60)
Glucose, Bld: 108 mg/dL — ABNORMAL HIGH (ref 70–99)
Potassium: 4.3 mmol/L (ref 3.5–5.1)
Sodium: 135 mmol/L (ref 135–145)
Total Bilirubin: 0.6 mg/dL (ref 0.3–1.2)
Total Protein: 7.4 g/dL (ref 6.5–8.1)

## 2022-03-31 LAB — CBC WITH DIFFERENTIAL (CANCER CENTER ONLY)
Abs Immature Granulocytes: 0.02 10*3/uL (ref 0.00–0.07)
Basophils Absolute: 0 10*3/uL (ref 0.0–0.1)
Basophils Relative: 0 %
Eosinophils Absolute: 0 10*3/uL (ref 0.0–0.5)
Eosinophils Relative: 0 %
HCT: 36.1 % (ref 36.0–46.0)
Hemoglobin: 12.8 g/dL (ref 12.0–15.0)
Immature Granulocytes: 0 %
Lymphocytes Relative: 6 %
Lymphs Abs: 0.4 10*3/uL — ABNORMAL LOW (ref 0.7–4.0)
MCH: 35.6 pg — ABNORMAL HIGH (ref 26.0–34.0)
MCHC: 35.5 g/dL (ref 30.0–36.0)
MCV: 100.3 fL — ABNORMAL HIGH (ref 80.0–100.0)
Monocytes Absolute: 0.1 10*3/uL (ref 0.1–1.0)
Monocytes Relative: 1 %
Neutro Abs: 6.5 10*3/uL (ref 1.7–7.7)
Neutrophils Relative %: 93 %
Platelet Count: 176 10*3/uL (ref 150–400)
RBC: 3.6 MIL/uL — ABNORMAL LOW (ref 3.87–5.11)
RDW: 14.5 % (ref 11.5–15.5)
WBC Count: 7.1 10*3/uL (ref 4.0–10.5)
nRBC: 0 % (ref 0.0–0.2)

## 2022-03-31 MED ORDER — SODIUM CHLORIDE 0.9 % IV SOLN
10.0000 mg | Freq: Once | INTRAVENOUS | Status: AC
  Administered 2022-03-31: 10 mg via INTRAVENOUS
  Filled 2022-03-31: qty 10

## 2022-03-31 MED ORDER — SODIUM CHLORIDE 0.9% FLUSH
10.0000 mL | INTRAVENOUS | Status: DC | PRN
  Administered 2022-03-31: 10 mL

## 2022-03-31 MED ORDER — SODIUM CHLORIDE 0.9 % IV SOLN
600.0000 mg | Freq: Once | INTRAVENOUS | Status: AC
  Administered 2022-03-31: 600 mg via INTRAVENOUS
  Filled 2022-03-31: qty 60

## 2022-03-31 MED ORDER — SODIUM CHLORIDE 0.9 % IV SOLN
420.0000 mg | Freq: Once | INTRAVENOUS | Status: AC
  Administered 2022-03-31: 420 mg via INTRAVENOUS
  Filled 2022-03-31: qty 14

## 2022-03-31 MED ORDER — ACETAMINOPHEN 325 MG PO TABS
650.0000 mg | ORAL_TABLET | Freq: Once | ORAL | Status: AC
  Administered 2022-03-31: 650 mg via ORAL
  Filled 2022-03-31: qty 2

## 2022-03-31 MED ORDER — SODIUM CHLORIDE 0.9 % IV SOLN
50.0000 mg/m2 | Freq: Once | INTRAVENOUS | Status: AC
  Administered 2022-03-31: 100 mg via INTRAVENOUS
  Filled 2022-03-31: qty 10

## 2022-03-31 MED ORDER — SODIUM CHLORIDE 0.9% FLUSH
10.0000 mL | Freq: Once | INTRAVENOUS | Status: AC
  Administered 2022-03-31: 10 mL

## 2022-03-31 MED ORDER — PALONOSETRON HCL INJECTION 0.25 MG/5ML
0.2500 mg | Freq: Once | INTRAVENOUS | Status: AC
  Administered 2022-03-31: 0.25 mg via INTRAVENOUS
  Filled 2022-03-31: qty 5

## 2022-03-31 MED ORDER — DIPHENHYDRAMINE HCL 25 MG PO CAPS
50.0000 mg | ORAL_CAPSULE | Freq: Once | ORAL | Status: AC
  Administered 2022-03-31: 50 mg via ORAL
  Filled 2022-03-31: qty 2

## 2022-03-31 MED ORDER — HEPARIN SOD (PORK) LOCK FLUSH 100 UNIT/ML IV SOLN
500.0000 [IU] | Freq: Once | INTRAVENOUS | Status: AC | PRN
  Administered 2022-03-31: 500 [IU]

## 2022-03-31 MED ORDER — TRASTUZUMAB-DKST CHEMO 150 MG IV SOLR
6.0000 mg/kg | Freq: Once | INTRAVENOUS | Status: AC
  Administered 2022-03-31: 525 mg via INTRAVENOUS
  Filled 2022-03-31: qty 25

## 2022-03-31 MED ORDER — SODIUM CHLORIDE 0.9 % IV SOLN
150.0000 mg | Freq: Once | INTRAVENOUS | Status: AC
  Administered 2022-03-31: 150 mg via INTRAVENOUS
  Filled 2022-03-31: qty 150

## 2022-03-31 MED ORDER — SODIUM CHLORIDE 0.9 % IV SOLN: Freq: Once | INTRAVENOUS | Status: AC

## 2022-03-31 NOTE — Assessment & Plan Note (Addendum)
Malignant neoplasm of upper-outer quadrant of right female breast ([place]) 11/19/2021:Palpable lump for 3 weeks, diagnostic mammogram: 2.2 cm x 2.2 cm irregular mass with a spiculated margin in the upper outer right breast. 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 HER2 positive, Ki-67 15% MRI Breast 12/09/21: Right Breast retroareolar mass 6.7 cm (involves nipple areolar complex), Rt axilla 2.1 cm node, Left breast: Several scattered foci left breast indeterminate 0.6 cm mass LIQ left breast and another indeterminate 0.6 cm mass in upper central Left breast (biopsies recommended: rt axillary LN, Left Breast indeterminate mass)  Treatment Plan: 1. Neoadjuvant chemotherapy withTCHP foll by [facility] vs Kadcyla maintenance 2. Followed by breast conserving surgery with sentinel lymph node study vs targeted axillary dissection 3. Followed by adjuvant radiation therapy ------------------------------------------------------------------------------------------------------------------------------------ Current Treatment: Cycle6TCHP (dose has been reduced) Chemo toxicities: 1. Diarrhea: Intermittently. 3-5 loose stools per day. She does not like to take medications but she is taking Lomotil on and off. 2. Chemotherapy-induced anemia: Monitoring closely 3. Mild nausea 4. Severe fatigue 5. Chemo-induced peripheral neuropathy: Neuropathy is up to the middle of her fingers.  Therefore reduce the Taxotere to 50 mg per metered squared and carboplatin down as well.  Breast MRI 02/26/2022: Very good response to the right breast cancer measuring 0.7 cm, non-mass enhancement much less intense (previously was 7 cm), decrease in the right axillary lymph node  RTC in 3 weeks for Herceptin and Perjeta maintenance. We will repeat another breast MRI. She would like to meet with surgery at [facility] as well as at [facility].  We will send referrals to those surgeons.

## 2022-03-31 NOTE — Progress Notes (Signed)
Referral for breast surg/onc consult faxed to [phone], Dr Jewel Baize [facility] new patient coordinators, Theadora Rama. Phone number [phone]. Pt is aware and verbalized thanks & understanding.

## 2022-03-31 NOTE — Progress Notes (Addendum)
 Patient Care Team: Gudena, Vinay, MD as PCP - General (Hematology and Oncology) Martini, Keisha N, RN as Oncology Nurse Navigator Stuart, Dawn C, RN as Oncology Nurse Navigator Blackman, Douglas, MD as Consulting Physician (General Surgery) Gudena, Vinay, MD as Consulting Physician (Hematology and Oncology) Kinard, James, MD as Consulting Physician (Radiation Oncology)  DIAGNOSIS:  Encounter Diagnosis  Name Primary?   Malignant neoplasm of upper-outer quadrant of right breast in female, estrogen receptor positive (HCC) Yes    SUMMARY OF ONCOLOGIC HISTORY: Oncology History  Malignant neoplasm of upper-outer quadrant of right female breast (HCC)  11/19/2021 Initial Diagnosis   Palpable lump for 3 weeks, diagnostic mammogram: 2.2 cm x 2.2 cm irregular mass with a spiculated margin in the upper outer right breast. 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 HER2 positive, Ki-67 15%   12/02/2021 Cancer Staging   Staging form: Breast, AJCC 8th Edition - Clinical stage from 12/02/2021: Stage IB (cT2, cN0, cM0, G2, ER+, PR+, HER2+) - Signed by Gudena, Vinay, MD on 12/02/2021 Stage prefix: Initial diagnosis Histologic grading system: 3 grade system    12/16/2021 -  Chemotherapy   Patient is on Treatment Plan : BREAST  Docetaxel + Carboplatin + Trastuzumab + Pertuzumab  (TCHP) q21d       01/22/2022 Genetic Testing   Negative hereditary cancer genetic testing: no pathogenic variants detected in [facility] +RNAinsight Panel.  Report date is 01/22/2022.   The CancerNext gene panel offered by [facility] [employer] includes sequencing, rearrangement analysis, and RNA analysis for the following 36 genes:   APC, ATM, AXIN2, BARD1, BMPR1A, BRCA1, BRCA2, BRIP1, CDH1, CDK4, CDKN2A, CHEK2, DICER1, HOXB13, EPCAM, GREM1, MLH1, MSH2, MSH3, MSH6, MUTYH, NBN, NF1, NTHL1, PALB2, PMS2, POLD1, POLE, PTEN, RAD51C, RAD51D, RECQL, SMAD4, SMARCA4, STK11, and TP53.      CHIEF COMPLIANT:  Cycle 6 TCHP  INTERVAL  HISTORY: Julie Atkinson is a 50 y.o. with above-mentioned history of right breast cancer, currently on chemotherapy with TCHP. She presents to the clinic today for treatment. States that her right arm is tingling.  States that it's still in the fingers also. States that its very minor.  She is very concerned about the recommendation from surgery to undergo mastectomy without sparing the nipple.  She would like to explore other options.  She would like to know if a lumpectomy is still possible.   ALLERGIES:  is allergic to codeine.  MEDICATIONS:  Current Outpatient Medications  Medication Sig Dispense Refill   clindamycin (CLINDAGEL) 1 % gel Apply topically 2 (two) times daily. 30 g 0   clonazePAM (KLONOPIN) 0.5 MG disintegrating tablet Take 1 tablet (0.5 mg total) by mouth 2 (two) times daily. 30 tablet 0   diphenoxylate-atropine (LOMOTIL) 2.5-0.025 MG tablet Take 1 tablet by mouth 3 (three) times daily as needed for diarrhea or loose stools. 60 tablet 3   lidocaine-prilocaine (EMLA) cream Apply to affected area once 30 g 3   LORazepam (ATIVAN) 0.5 MG tablet Take 1 tablet (0.5 mg total) by mouth at bedtime as needed for anxiety. 30 tablet 0   No current facility-administered medications for this visit.   Facility-Administered Medications Ordered in Other Visits  Medication Dose Route Frequency Provider Last Rate Last Admin   CARBOplatin (PARAPLATIN) 600 mg in sodium chloride 0.9 % 250 mL chemo infusion  600 mg Intravenous Once Gudena, Vinay, MD       dexamethasone (DECADRON) 10 mg in sodium chloride 0.9 % 50 mL IVPB  10 mg Intravenous Once Gudena,   Vinay, MD 204 mL/hr at 03/31/22 1107 10 mg at 03/31/22 1107   DOCEtaxel (TAXOTERE) 100 mg in sodium chloride 0.9 % 250 mL chemo infusion  50 mg/m2 (Treatment Plan Recorded) Intravenous Once Gudena, Vinay, MD       fosaprepitant (EMEND) 150 mg in sodium chloride 0.9 % 145 mL IVPB  150 mg Intravenous Once Gudena, Vinay, MD       heparin lock flush 100  unit/mL  500 Units Intracatheter Once PRN Gudena, Vinay, MD       pertuzumab (PERJETA) 420 mg in sodium chloride 0.9 % 250 mL chemo infusion  420 mg Intravenous Once Gudena, Vinay, MD       sodium chloride flush (NS) 0.9 % injection 10 mL  10 mL Intracatheter PRN Gudena, Vinay, MD       trastuzumab-dkst (OGIVRI) 525 mg in sodium chloride 0.9 % 250 mL chemo infusion  6 mg/kg (Treatment Plan Recorded) Intravenous Once Gudena, Vinay, MD        PHYSICAL EXAMINATION: ECOG PERFORMANCE STATUS: 1 - Symptomatic but completely ambulatory  Vitals:   03/31/22 0959  BP: 138/84  Pulse: 70  Resp: 17  Temp: (!) 97.2 F (36.2 C)  SpO2: 100%   Filed Weights   03/31/22 0959  Weight: 182 lb 6.4 oz (82.7 kg)      LABORATORY DATA:  I have reviewed the data as listed    Latest Ref Rng & Units 03/31/2022    9:33 AM 03/10/2022    9:44 AM 02/17/2022   10:09 AM  CMP  Glucose 70 - 99 mg/dL 108   138   77    BUN 6 - 20 mg/dL 8   11   9    Creatinine 0.44 - 1.00 mg/dL 0.67   0.68   0.61    Sodium 135 - 145 mmol/L 135   136   137    Potassium 3.5 - 5.1 mmol/L 4.3   3.7   3.7    Chloride 98 - 111 mmol/L 103   105   107    CO2 22 - 32 mmol/L 25   21   25    Calcium 8.9 - 10.3 mg/dL 9.4   9.1   8.5    Total Protein 6.5 - 8.1 g/dL 7.4   6.8   6.5    Total Bilirubin 0.3 - 1.2 mg/dL 0.6   0.5   0.4    Alkaline Phos 38 - 126 U/L 67   62   65    AST 15 - 41 U/L 18   24   23    ALT 0 - 44 U/L 14   18   20      Lab Results  Component Value Date   WBC 7.1 03/31/2022   HGB 12.8 03/31/2022   HCT 36.1 03/31/2022   MCV 100.3 (H) 03/31/2022   PLT 176 03/31/2022   NEUTROABS 6.5 03/31/2022    ASSESSMENT & PLAN:  Malignant neoplasm of upper-outer quadrant of right female breast (HCC) Malignant neoplasm of upper-outer quadrant of right female breast (HCC) 11/19/2021:Palpable lump for 3 weeks, diagnostic mammogram: 2.2 cm x 2.2 cm irregular mass with a spiculated margin in the upper outer right breast. Biopsy:  Mixed grade 2 IDC and ILC, ER/PR+(60%). HER2 positive, Ki-67 15% MRI Breast 12/09/21:  Right Breast retroareolar mass 6.7 cm (involves nipple areolar complex), Rt axilla 2.1 cm node, Left breast: Several scattered foci left breast indeterminate 0.6 cm mass LIQ left   breast and another indeterminate 0.6 cm mass in upper central Left breast (biopsies recommended: rt axillary LN, Left Breast indeterminate mass)   Treatment Plan: 1. Neoadjuvant chemotherapy with TCHP foll by HP vs Kadcyla maintenance 2. Followed by Mastectomy vs Large Lumpectomy 3. Followed by adjuvant radiation therapy (If lumpectomy) ------------------------------------------------------------------------------------------------------------------------------------ Current Treatment: Cycle 6 TCHP (dose has been reduced) Chemo toxicities: Diarrhea: Intermittently.  3-5 loose stools per day.  She does not like to take medications but she is taking Lomotil on and off. Chemotherapy-induced anemia: Monitoring closely Mild nausea Severe fatigue Chemo-induced peripheral neuropathy: Neuropathy is up to the middle of her fingers.  Therefore reduce the Taxotere to 50 mg per metered squared and carboplatin down as well.   Breast MRI 02/26/2022: Very good response to the right breast cancer measuring 0.7 cm, non-mass enhancement much less intense (previously was 7 cm), decrease in the right axillary lymph node   RTC in 3 weeks for Herceptin and Perjeta maintenance. We will repeat another breast MRI. She would like to meet with surgery at [facility] as well as at [facility].  We will send referrals to those surgeons.   Orders Placed This Encounter  Procedures   MR BREAST BILATERAL W WO CONTRAST INC CAD    Standing Status:   Future    Standing Expiration Date:   04/01/2023    Order Specific Question:   If indicated for the ordered procedure, I authorize the administration of contrast media per Radiology protocol    Answer:   Yes    Order Specific  Question:   What is the patient's sedation requirement?    Answer:   No Sedation    Order Specific Question:   Does the patient have a pacemaker or implanted devices?    Answer:   No    Order Specific Question:   Preferred imaging location?    Answer:   [facility] (table limit-550lbs)   The patient has a good understanding of the overall plan. she agrees with it. she will call with any problems that may develop before the next visit here. Total time spent: 30 mins including face to face time and time spent for planning, charting and co-ordination of care   Harriette Ohara, MD 03/31/22

## 2022-03-31 NOTE — Patient Instructions (Signed)
[facility]  Discharge Instructions: Thank you for choosing [facility] to provide your oncology and hematology care.   If you have a lab appointment with the [facility], please go directly to the [facility] and check in at the registration area.   Wear comfortable clothing and clothing appropriate for easy access to any Portacath or PICC line.   We strive to give you quality time with your provider. You may need to reschedule your appointment if you arrive late (15 or more minutes).  Arriving late affects you and other patients whose appointments are after yours.  Also, if you miss three or more appointments without notifying the office, you may be dismissed from the clinic at the provider's discretion.      For prescription refill requests, have your pharmacy contact our office and allow 72 hours for refills to be completed.    Today you received the following chemotherapy and/or immunotherapy agents trastuzumab, pertuzumab, paclitaxel, carboplatin      To help prevent nausea and vomiting after your treatment, we encourage you to take your nausea medication as directed.  BELOW ARE SYMPTOMS THAT SHOULD BE REPORTED IMMEDIATELY: *FEVER GREATER THAN 100.4 F (38 C) OR HIGHER *CHILLS OR SWEATING *NAUSEA AND VOMITING THAT IS NOT CONTROLLED WITH YOUR NAUSEA MEDICATION *UNUSUAL SHORTNESS OF BREATH *UNUSUAL BRUISING OR BLEEDING *URINARY PROBLEMS (pain or burning when urinating, or frequent urination) *BOWEL PROBLEMS (unusual diarrhea, constipation, pain near the anus) TENDERNESS IN MOUTH AND THROAT WITH OR WITHOUT PRESENCE OF ULCERS (sore throat, sores in mouth, or a toothache) UNUSUAL RASH, SWELLING OR PAIN  UNUSUAL VAGINAL DISCHARGE OR ITCHING   Items with * indicate a potential emergency and should be followed up as soon as possible or go to the Emergency Department if any problems should occur.  Please show the CHEMOTHERAPY ALERT CARD or  IMMUNOTHERAPY ALERT CARD at check-in to the Emergency Department and triage nurse.  Should you have questions after your visit or need to cancel or reschedule your appointment, please contact [facility]  Dept: [phone]  and follow the prompts.  Office hours are 8:00 a.m. to 4:30 p.m. Monday - Friday. Please note that voicemails left after 4:00 p.m. may not be returned until the following business day.  We are closed weekends and major holidays. You have access to a nurse at all times for urgent questions. Please call the main number to the clinic Dept: [phone] and follow the prompts.   For any non-urgent questions, you may also contact your provider using MyChart. We now offer e-Visits for anyone 37 and older to request care online for non-urgent symptoms. For details visit [email or web address].   Also download the MyChart app! Go to the app store, search "MyChart", open the app, select [facility], and log in with your MyChart username and password.  Due to Covid, a mask is required upon entering the hospital/clinic. If you do not have a mask, one will be given to you upon arrival. For doctor visits, patients may have 1 support person aged 39 or older with them. For treatment visits, patients cannot have anyone with them due to current Covid guidelines and our immunocompromised population.

## 2022-04-01 ENCOUNTER — Telehealth: Payer: Self-pay | Admitting: Hematology and Oncology

## 2022-04-01 NOTE — Telephone Encounter (Signed)
Scheduled appointment per 5/25 los. Patient is aware.

## 2022-04-02 ENCOUNTER — Inpatient Hospital Stay: Payer: BC Managed Care – PPO

## 2022-04-02 DIAGNOSIS — Z170 Estrogen receptor positive status [ER+]: Secondary | ICD-10-CM | POA: Diagnosis not present

## 2022-04-02 DIAGNOSIS — R197 Diarrhea, unspecified: Secondary | ICD-10-CM | POA: Diagnosis not present

## 2022-04-02 DIAGNOSIS — T451X5A Adverse effect of antineoplastic and immunosuppressive drugs, initial encounter: Secondary | ICD-10-CM | POA: Diagnosis not present

## 2022-04-02 DIAGNOSIS — C50411 Malignant neoplasm of upper-outer quadrant of right female breast: Secondary | ICD-10-CM | POA: Diagnosis not present

## 2022-04-02 DIAGNOSIS — D6481 Anemia due to antineoplastic chemotherapy: Secondary | ICD-10-CM | POA: Diagnosis not present

## 2022-04-02 DIAGNOSIS — Z5111 Encounter for antineoplastic chemotherapy: Secondary | ICD-10-CM | POA: Diagnosis not present

## 2022-04-02 DIAGNOSIS — Z5112 Encounter for antineoplastic immunotherapy: Secondary | ICD-10-CM | POA: Diagnosis not present

## 2022-04-02 DIAGNOSIS — Z5189 Encounter for other specified aftercare: Secondary | ICD-10-CM | POA: Diagnosis not present

## 2022-04-02 DIAGNOSIS — G620 Drug-induced polyneuropathy: Secondary | ICD-10-CM | POA: Diagnosis not present

## 2022-04-02 MED ORDER — PEGFILGRASTIM-CBQV 6 MG/0.6ML ~~LOC~~ SOSY
6.0000 mg | PREFILLED_SYRINGE | Freq: Once | SUBCUTANEOUS | Status: AC
  Administered 2022-04-02: 6 mg via SUBCUTANEOUS
  Filled 2022-04-02: qty 0.6

## 2022-04-07 ENCOUNTER — Encounter: Payer: Self-pay | Admitting: Hematology and Oncology

## 2022-04-07 NOTE — Progress Notes (Incomplete)
Patient Care Team: Nicholas Lose, MD as PCP - General (Hematology and Oncology) Rockwell Germany, RN as Oncology Nurse Navigator Mauro Kaufmann, RN as Oncology Nurse Navigator Coralie Keens, MD as Consulting Physician (General Surgery) Nicholas Lose, MD as Consulting Physician (Hematology and Oncology) Gery Pray, MD as Consulting Physician (Radiation Oncology)  DIAGNOSIS: No diagnosis found.  SUMMARY OF ONCOLOGIC HISTORY: Oncology History  Malignant neoplasm of upper-outer quadrant of right female breast ([place])  11/19/2021 Initial Diagnosis   Palpable lump for 3 weeks, diagnostic mammogram: 2.2 cm x 2.2 cm irregular mass with a spiculated margin in the upper outer right breast. 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 HER2 positive, Ki-67 15%   12/02/2021 Cancer Staging   Staging form: Breast, AJCC 8th Edition - Clinical stage from 12/02/2021: Stage IB (cT2, cN0, cM0, G2, ER+, PR+, HER2+) - Signed by Nicholas Lose, MD on 12/02/2021 Stage prefix: Initial diagnosis Histologic grading system: 3 grade system    12/16/2021 -  Chemotherapy   Patient is on Treatment Plan : BREAST  Docetaxel + Carboplatin + Trastuzumab + Pertuzumab  (TCHP) q21d       01/22/2022 Genetic Testing   Negative hereditary cancer genetic testing: no pathogenic variants detected in [facility] +RNAinsight Panel.  Report date is 01/22/2022.   The CancerNext gene panel offered by [employer] [facility] includes sequencing, rearrangement analysis, and RNA analysis for the following 36 genes:   APC, ATM, AXIN2, BARD1, BMPR1A, BRCA1, BRCA2, BRIP1, CDH1, CDK4, CDKN2A, CHEK2, DICER1, HOXB13, EPCAM, GREM1, MLH1, MSH2, MSH3, MSH6, MUTYH, NBN, NF1, NTHL1, PALB2, PMS2, POLD1, POLE, PTEN, RAD51C, RAD51D, RECQL, SMAD4, SMARCA4, STK11, and TP53.      CHIEF COMPLIANT: Herceptin Perjeta    INTERVAL HISTORY: Julie Atkinson is a 51 y.o. with above-mentioned history of right breast cancer, currently on Herceptin Perjeta.  She presents to the clinic today for treatment.    ALLERGIES:  is allergic to codeine.  MEDICATIONS:  Current Outpatient Medications  Medication Sig Dispense Refill   clindamycin (CLINDAGEL) 1 % gel Apply topically 2 (two) times daily. 30 g 0   clonazePAM (KLONOPIN) 0.5 MG disintegrating tablet Take 1 tablet (0.5 mg total) by mouth 2 (two) times daily. 30 tablet 0   diphenoxylate-atropine (LOMOTIL) 2.5-0.025 MG tablet Take 1 tablet by mouth 3 (three) times daily as needed for diarrhea or loose stools. 60 tablet 3   lidocaine-prilocaine (EMLA) cream Apply to affected area once 30 g 3   LORazepam (ATIVAN) 0.5 MG tablet Take 1 tablet (0.5 mg total) by mouth at bedtime as needed for anxiety. 30 tablet 0   No current facility-administered medications for this visit.    PHYSICAL EXAMINATION: ECOG PERFORMANCE STATUS: {CHL ONC ECOG [phone]}  There were no vitals filed for this visit. There were no vitals filed for this visit.  BREAST:*** No palpable masses or nodules in either right or left breasts. No palpable axillary supraclavicular or infraclavicular adenopathy no breast tenderness or nipple discharge. (exam performed in the presence of a chaperone)  LABORATORY DATA:  I have reviewed the data as listed    Latest Ref Rng & Units 03/31/2022    9:33 AM 03/10/2022    9:44 AM 02/17/2022   10:09 AM  CMP  Glucose 70 - 99 mg/dL 108   138   77    BUN 6 - 20 mg/dL 8   11   9     Creatinine 0.44 - 1.00 mg/dL 0.67   0.68   0.61  Sodium 135 - 145 mmol/L 135   136   137    Potassium 3.5 - 5.1 mmol/L 4.3   3.7   3.7    Chloride 98 - 111 mmol/L 103   105   107    CO2 22 - 32 mmol/L 25   21   25     Calcium 8.9 - 10.3 mg/dL 9.4   9.1   8.5    Total Protein 6.5 - 8.1 g/dL 7.4   6.8   6.5    Total Bilirubin 0.3 - 1.2 mg/dL 0.6   0.5   0.4    Alkaline Phos 38 - 126 U/L 67   62   65    AST 15 - 41 U/L 18   24   23     ALT 0 - 44 U/L 14   18   20       Lab Results  Component Value Date   WBC  7.1 03/31/2022   HGB 12.8 03/31/2022   HCT 36.1 03/31/2022   MCV 100.3 (H) 03/31/2022   PLT 176 03/31/2022   NEUTROABS 6.5 03/31/2022    ASSESSMENT & PLAN:  No problem-specific Assessment & Plan notes found for this encounter.    No orders of the defined types were placed in this encounter.  The patient has a good understanding of the overall plan. she agrees with it. she will call with any problems that may develop before the next visit here. Total time spent: 30 mins including face to face time and time spent for planning, charting and co-ordination of care   Suzzette Righter, [place] 04/07/22    I Gardiner Coins am scribing for Dr. Lindi Adie  ***

## 2022-04-08 ENCOUNTER — Encounter: Payer: Self-pay | Admitting: Hematology and Oncology

## 2022-04-09 ENCOUNTER — Other Ambulatory Visit: Payer: Self-pay | Admitting: *Deleted

## 2022-04-09 ENCOUNTER — Ambulatory Visit
Admission: RE | Admit: 2022-04-09 | Discharge: 2022-04-09 | Disposition: A | Discharge status: Home / Self Care | Payer: BC Managed Care – PPO | Source: Ambulatory Visit | Attending: Hematology and Oncology | Admitting: Hematology and Oncology

## 2022-04-09 DIAGNOSIS — N6489 Other specified disorders of breast: Secondary | ICD-10-CM | POA: Diagnosis not present

## 2022-04-09 DIAGNOSIS — R590 Localized enlarged lymph nodes: Secondary | ICD-10-CM | POA: Diagnosis not present

## 2022-04-09 DIAGNOSIS — Z170 Estrogen receptor positive status [ER+]: Secondary | ICD-10-CM

## 2022-04-09 IMAGING — MR MR BREAST BILAT WO/W CM
8 of 13 series · 32 of 48 positions shown · IV contrast (gadavist)
Comparison: Previous exams.

CLINICAL DATA: Known 2 site positive ultrasound core biopsy right
breast [DATE] demonstrating invasive mammary carcinoma. Two
benign MRI guided left breast biopsies [DATE]. Patient is
post neoadjuvant chemotherapy.

EXAM:
BILATERAL BREAST MRI WITH AND WITHOUT CONTRAST
TECHNIQUE: Multiplanar, multisequence MR images of both breasts were obtained
prior to and following the intravenous administration of 9 ml of
Gadavist.

[Series 2: t2_tirm_tra ipat (a-p) · axial · 3.0mm · 0.70mm/px · 1 of 55 slices shown]
[im 1/55]
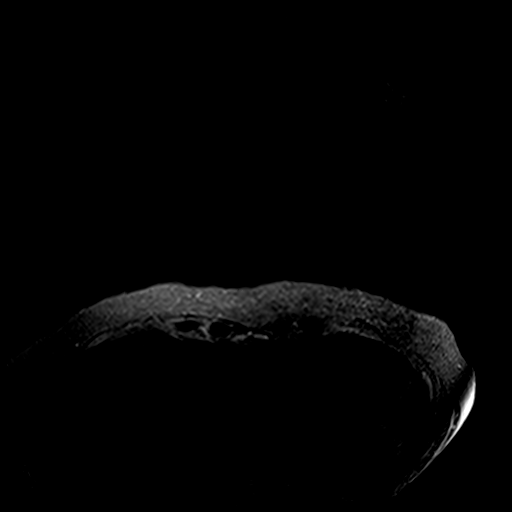

[Series 3: fl3d pre-cm no · axial · non-contrast · 1.2mm · 0.89mm/px · z∈[-113,+58]mm · 5 of 144 slices shown]
[im 1/144]
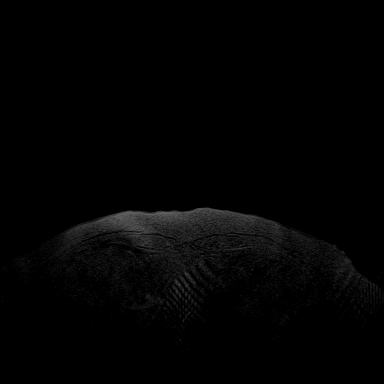
[im 36/144]
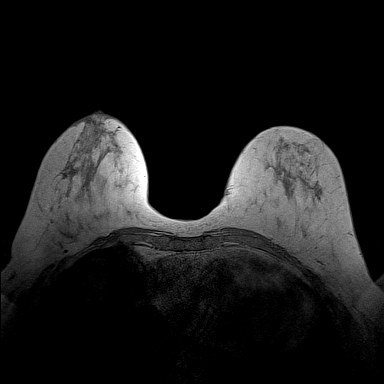
[im 72/144]
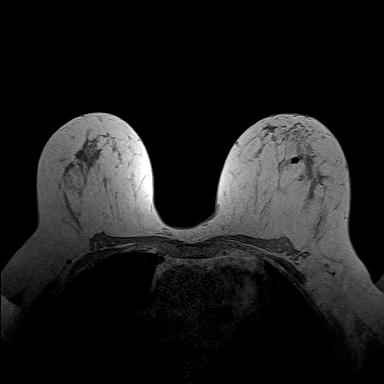
[im 108/144]
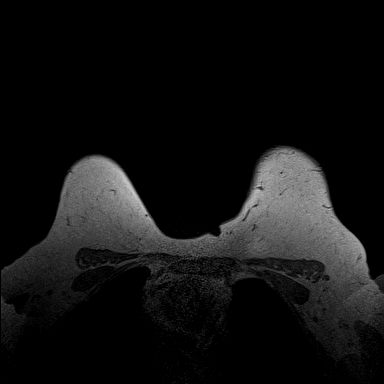
[im 144/144]
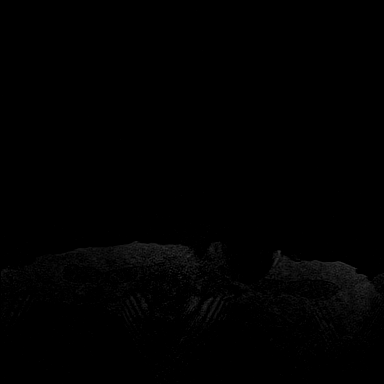

[Series 4: fl3d pre-cm · axial · non-contrast · 1.2mm · 0.89mm/px · z∈[-113,+58]mm · 5 of 144 slices shown]
[im 1/144]
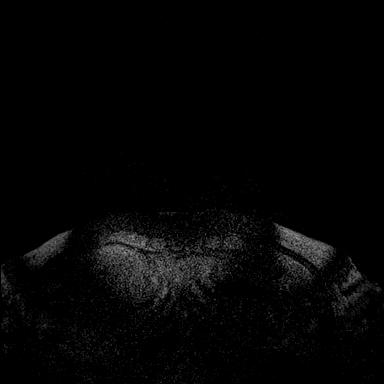
[im 36/144]
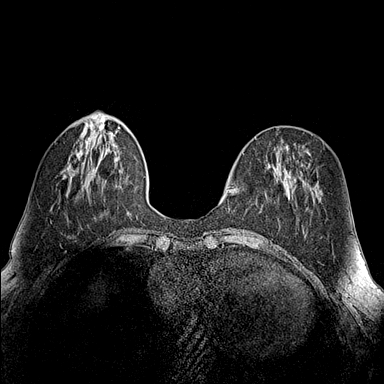
[im 72/144]
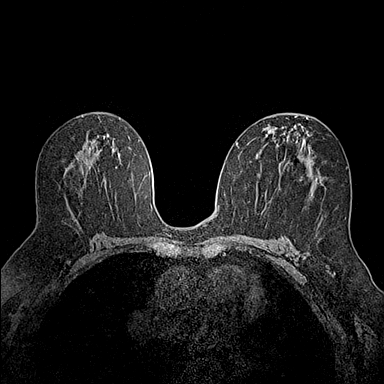
[im 108/144]
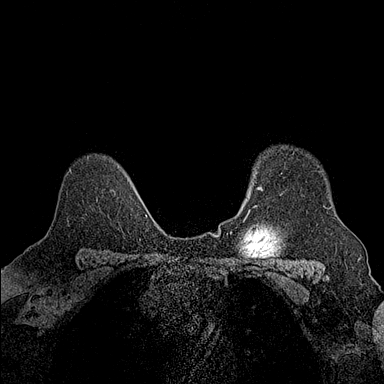
[im 144/144]
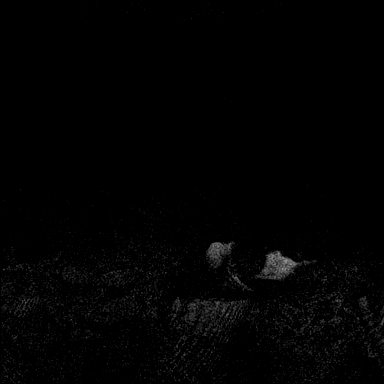

[Series 8: fl3d post-cm 20 · axial · 1.2mm · 0.89mm/px · z∈[-113,+58]mm · 5 of 144 slices shown (1 of 3)]
[im 1/144]
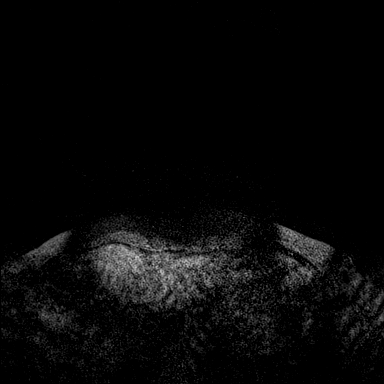
[im 36/144]
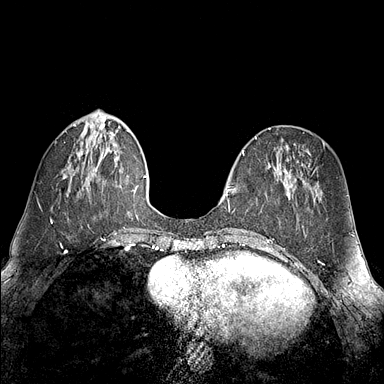
[im 72/144]
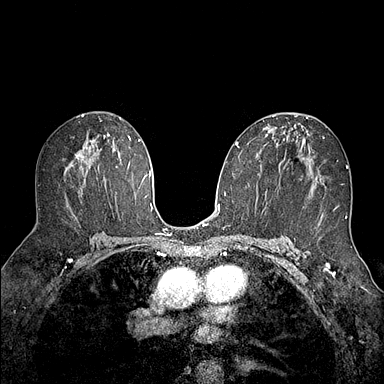
[im 108/144]
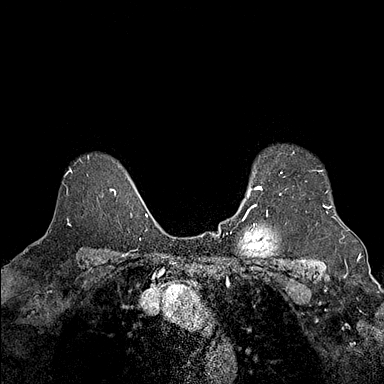
[im 144/144]
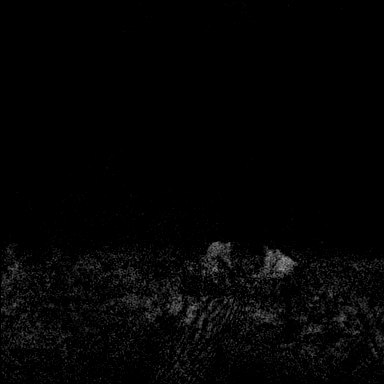

[Series 9: fl3d post-cm 20 · axial · 1.2mm · 0.89mm/px · z∈[-113,+58]mm · 5 of 144 slices shown (2 of 3)]
[im 1/144]
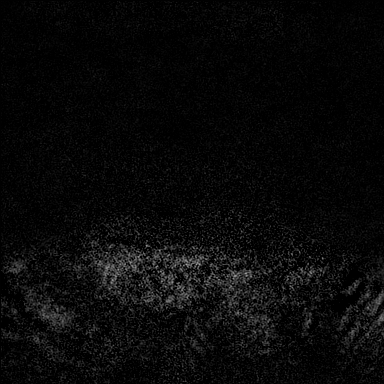
[im 36/144]
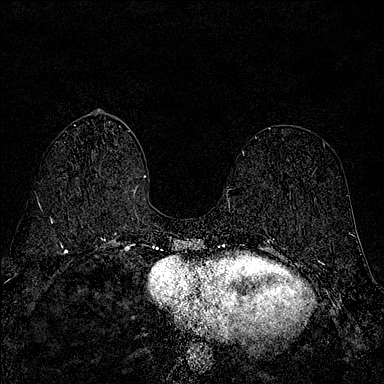
[im 72/144]
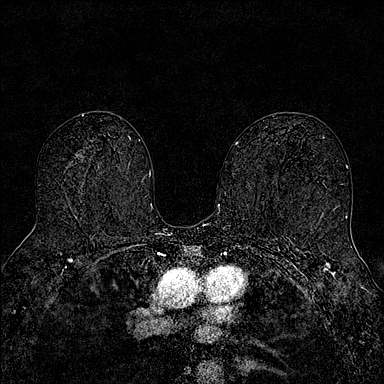
[im 108/144]
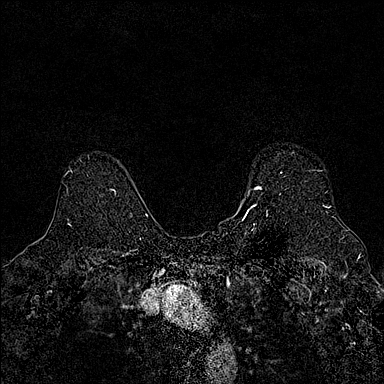
[im 144/144]
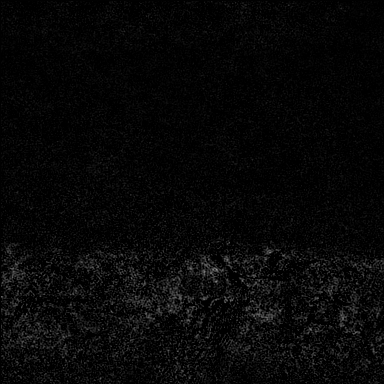

[Series 10: fl3d post-cm 20 · axial · 172.8mm · 0.89mm/px · 1 of 1 slices shown (3 of 3)]
[im 1/1]
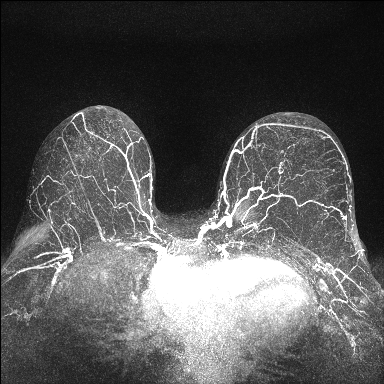

[Series 11: fl3d post-cm 3 · axial · 1.2mm · 0.89mm/px · z∈[-113,+58]mm · 5 of 144 slices shown (1 of 2)]
[im 1/144]
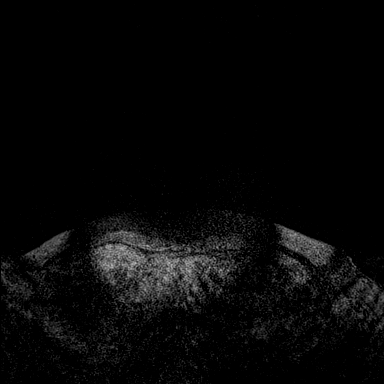
[im 36/144]
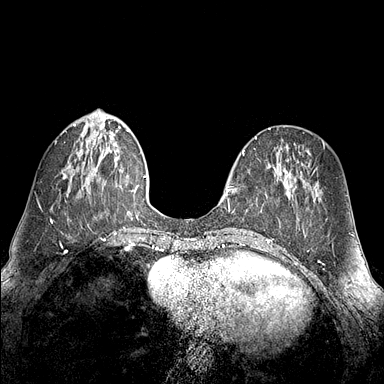
[im 72/144]
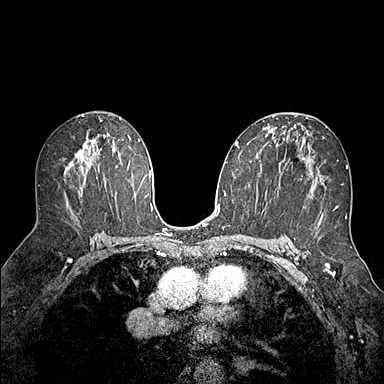
[im 108/144]
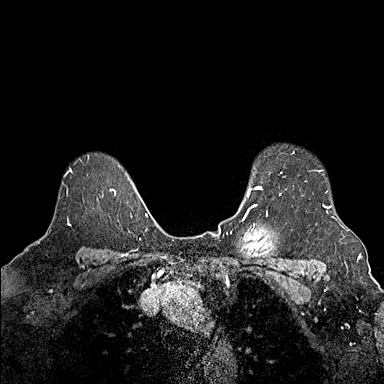
[im 144/144]
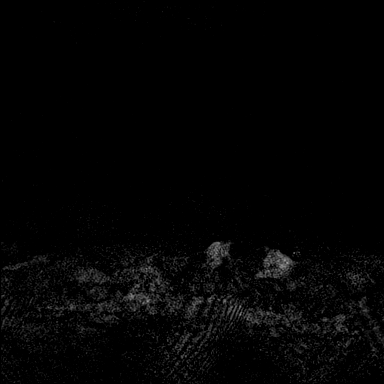

[Series 12: fl3d post-cm 3 · axial · 1.2mm · 0.89mm/px · z∈[-113,+24]mm · 5 of 144 slices shown (2 of 2)]
[im 1/144]
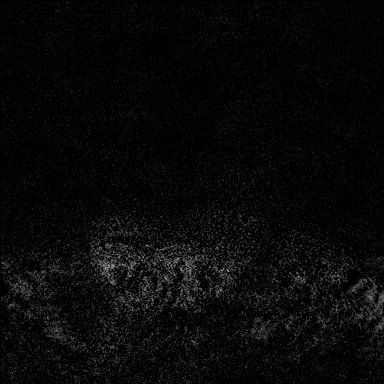
[im 29/144]
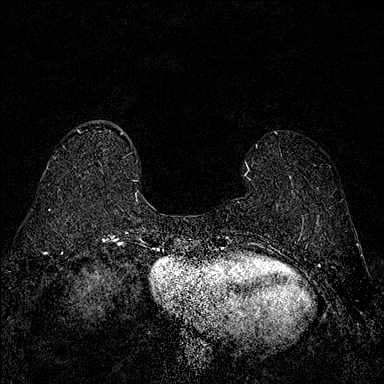
[im 58/144]
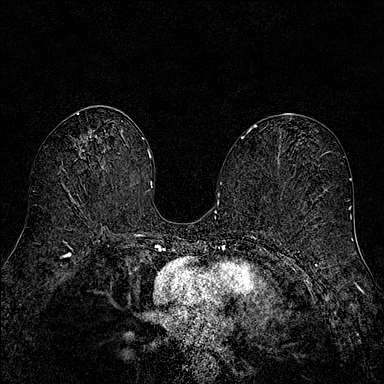
[im 86/144]
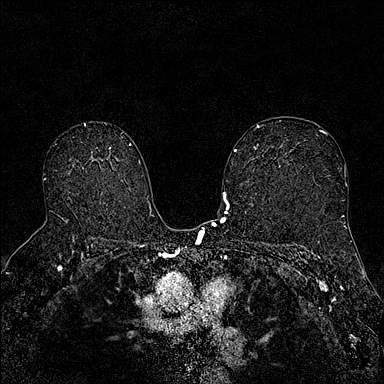
[im 115/144]
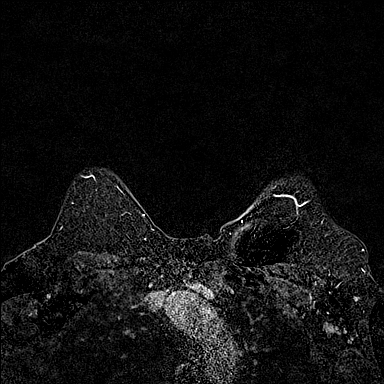

[32 of 48 positions shown; findings below may reference images not displayed]

Three-dimensional MR images were rendered by post-processing of the
original MR data on an independent workstation. The
three-dimensional MR images were interpreted, and findings are
reported in the following complete MRI report for this study. Three
dimensional images were evaluated at the independent interpreting
workstation using the DynaCAD thin client.
FINDINGS: Breast composition: b. Scattered fibroglandular tissue.

Background parenchymal enhancement: Mild

Right breast: Clip artifact present over the middle third right
upper outer quadrant as well as the right retroareolar region
marking patient's known sites of malignancy. There has been
significant positive chemotherapy response compared to [DATE]
as there is only subtle residual non mass enhancement over the
biopsy sites in the upper-outer quadrant extending to the
retroareolar region. There is also interval improvement compared to
the recent MRI [DATE]. No new areas of abnormal enhancement or
masses within the right breast.

Left breast: Clip artifact over patient's benign biopsy sites in the
inner lower quadrant and upper central breast. No abnormal masses or
suspicious enhancement.

Lymph nodes: Decrease in size of a 1.1 cm right axillary lymph node
when compared to the initial MRI [DATE] and no change
compared to [DATE]. No abnormal left axillary lymph nodes.

Ancillary findings:  None.
IMPRESSION: 1. Significant positive chemotherapy response with only subtle
residual non mass enhancement over the known areas of malignancy in
the right breast in the upper-outer quadrant extending to the
retroareolar region. Remainder of the right breast as well as the
left breast is unremarkable.

2. Initially seen enlarged right axillary lymph node has decreased
in size and measures 1.1 cm as this is unchanged from [DATE].

RECOMMENDATION:
Recommend continued management per clinical treatment plan.

BI-RADS CATEGORY  6: Known biopsy-proven malignancy.

## 2022-04-09 MED ORDER — GADOBUTROL 1 MMOL/ML IV SOLN
9.0000 mL | Freq: Once | INTRAVENOUS | Status: AC | PRN
  Administered 2022-04-09: 9 mL via INTRAVENOUS

## 2022-04-09 MED ORDER — HEPARIN SOD (PORK) LOCK FLUSH 100 UNIT/ML IV SOLN
500.0000 [IU] | Freq: Once | INTRAVENOUS | Status: AC
  Administered 2022-04-09: 500 [IU] via INTRAVENOUS

## 2022-04-09 MED ORDER — SODIUM CHLORIDE 0.9% FLUSH
10.0000 mL | INTRAVENOUS | Status: DC | PRN
Start: 2022-04-09 — End: 2022-04-10
  Administered 2022-04-09: 10 mL via INTRAVENOUS

## 2022-04-09 NOTE — Progress Notes (Signed)
Per MD request, RN successfully faxed [phone]) referral to Dr. Dorann Ou for mastectomy consultation. Confirmed with Otila Kluver with Dr. Ernst Bowler that referral was received [phone]).

## 2022-04-13 ENCOUNTER — Ambulatory Visit (INDEPENDENT_AMBULATORY_CARE_PROVIDER_SITE_OTHER): Payer: BC Managed Care – PPO | Admitting: Plastic Surgery

## 2022-04-13 ENCOUNTER — Encounter: Payer: Self-pay | Admitting: Plastic Surgery

## 2022-04-13 VITALS — BP 148/92 | HR 83 | Ht 67.75 in | Wt 177.4 lb

## 2022-04-13 DIAGNOSIS — C50911 Malignant neoplasm of unspecified site of right female breast: Secondary | ICD-10-CM | POA: Diagnosis not present

## 2022-04-13 DIAGNOSIS — C50411 Malignant neoplasm of upper-outer quadrant of right female breast: Secondary | ICD-10-CM | POA: Diagnosis not present

## 2022-04-13 DIAGNOSIS — Z170 Estrogen receptor positive status [ER+]: Secondary | ICD-10-CM | POA: Diagnosis not present

## 2022-04-13 NOTE — Progress Notes (Signed)
Patient ID: Julie Atkinson, female    DOB: 13-Oct-1971, 51 y.o.   MRN: [id number]   Chief Complaint  Patient presents with   Breast Cancer    The patient is a 51 year old female here for consultation for breast reconstruction.  She was diagnosed with right-sided breast cancer of the upper outer quadrant invasive ductal carcinoma and invasive lobular carcinoma that is estrogen and progesterone positive and HER2 positive with a Ki-67 15%.  She is 5 feet 7 inches tall and weighs 177 pounds her preoperative bra size is a large C cup.  She would like to be around a B cup after the surgery.  She is not on blood thinners and does not have diabetes.  She is trying to decide between mastectomy and partial mastectomy.  She also has arranged a consultation at [facility] and at [facility].    Review of Systems  Constitutional: Negative.   HENT: Negative.    Eyes: Negative.   Respiratory: Negative.    Cardiovascular: Negative.   Gastrointestinal: Negative.   Genitourinary: Negative.   Musculoskeletal: Negative.   Skin: Negative.    Past Medical History:  Diagnosis Date   Depression    Family history of breast cancer 12/04/2021   Family history of ovarian cancer 12/04/2021   Fibroid Jan 05, 2014   8 mm   History of posttraumatic stress disorder (PTSD) 2008/01/06   due to maternal death-MI   PTSD (post-traumatic stress disorder)    mother's death Jan 06, 2008; seizure and AMI while driving, patient performed CPR x 45 minutes   Seropositive for herpes simplex 2 infection 2016-01-06    Past Surgical History:  Procedure Laterality Date   BLADDER SURGERY  1978   nocturnal enuresis treatment   CESAREAN SECTION  Jan 05, 2006   Dr Quincy Simmonds   DILATATION & CURETTAGE/HYSTEROSCOPY WITH MYOSURE N/A 01/06/2016   Procedure: DILATATION & CURETTAGE/HYSTEROSCOPY WITH MYOSURE;  Surgeon: Nunzio Cobbs, MD;  Location: [facility];  Service: Gynecology;  Laterality: N/A;   DILATION AND CURETTAGE OF UTERUS  1990   MAB?   PORTACATH PLACEMENT  Left 12/15/2021   Procedure: INSERTION PORT-A-CATH;  Surgeon: Coralie Keens, MD;  Location: [place];  Service: General;  Laterality: Left;      Current Outpatient Medications:    clindamycin (CLINDAGEL) 1 % gel, Apply topically 2 (two) times daily., Disp: 30 g, Rfl: 0   clonazePAM (KLONOPIN) 0.5 MG disintegrating tablet, Take 1 tablet (0.5 mg total) by mouth 2 (two) times daily., Disp: 30 tablet, Rfl: 0   diphenoxylate-atropine (LOMOTIL) 2.5-0.025 MG tablet, Take 1 tablet by mouth 3 (three) times daily as needed for diarrhea or loose stools., Disp: 60 tablet, Rfl: 3   lidocaine-prilocaine (EMLA) cream, Apply to affected area once, Disp: 30 g, Rfl: 3   LORazepam (ATIVAN) 0.5 MG tablet, Take 1 tablet (0.5 mg total) by mouth at bedtime as needed for anxiety., Disp: 30 tablet, Rfl: 0   Objective:   Vitals:   04/13/22 1211  BP: (!) 148/92  Pulse: 83  SpO2: 99%    Physical Exam Vitals reviewed.  Constitutional:      Appearance: Normal appearance.  HENT:     Head: Normocephalic and atraumatic.  Cardiovascular:     Rate and Rhythm: Normal rate.     Pulses: Normal pulses.  Pulmonary:     Effort: Pulmonary effort is normal.  Abdominal:     General: There is no distension.     Palpations: Abdomen is soft.  Tenderness: There is no abdominal tenderness.  Musculoskeletal:        General: No swelling or deformity.  Skin:    General: Skin is warm.     Capillary Refill: Capillary refill takes less than 2 seconds.     Coloration: Skin is not jaundiced.     Findings: No bruising or lesion.  Neurological:     Mental Status: She is alert and oriented to person, place, and time.  Psychiatric:        Mood and Affect: Mood normal.        Behavior: Behavior normal.        Thought Content: Thought content normal.        Judgment: Judgment normal.    Assessment & Plan:  Malignant neoplasm of upper-outer quadrant of right breast in female, estrogen receptor positive  ([place])  The options for reconstruction we explained to the patient / family for breast reconstruction.  There are two general categories of reconstruction.  We can reconstruction a breast with implants or use the patient's own tissue.  These were further discussed as listed.  Breast reconstruction is an optional procedure and eligibility depends on the full spectrum of the health of the patient and any co-morbidities.  More than one surgery is often needed to complete the reconstruction process.  The process can take three to twelve months to complete.  The breasts will not be identical due to many factors such as rib differences, shoulder asymmetry and treatments such as radiation.  The goal is to get the breasts to look normal and symmetrical in clothes.  Scars are a part of surgery and may fade some in time but will always be present under clothes.  Surgery may be an option on the non-cancer breast to achieve more symmetry.  No matter which procedure is chosen there is always the risk of complications and even failure of the body to heal.  This could result in no breast.    The options for reconstruction include:  1. Placement of a tissue expander with Acellular dermal matrix. When the expander is the desired size surgery is performed to remove the expander and place an implant.  In some cases the implant can be placed without an expander.  2. Autologous reconstruction can include using a muscle or tissue from another area of the body to create a breast.  3. Combined procedures (ie. latissismus dorsi flap) can be done with an expander / implant placed under the muscle.   The risks, benefits, scars and recovery time were discussed for each of the above. Risks include bleeding, infection, hematoma, seroma, scarring, pain, wound healing complications, flap loss, fat necrosis, capsular contracture, need for implant removal, donor site complications, bulge, hernia, umbilical necrosis, need for urgent  reoperation, and need for dressing changes.   The procedure the patient selected / that was best for the patient, was then discussed in further detail.  Total time: 45 minutes. This includes time spent with the patient during the visit as well as time spent before and after the visit reviewing the chart, documenting the encounter, making phone calls and reviewing studies.   The patient does not want to do autologous reconstruction.  She is trying to decide between bilateral lumpectomies and bilateral mastectomies.  She would be a good candidate for oncologic breast reduction at the time of the lumpectomies.  She is also a good candidate for implant-based reconstruction but I do not recommend that she try and salvage her nipple  areolar complex because I think she would be droopy without a chance of making her perky in the future.  Patient is going to have to other consults and then she will let us know what she wants to do.  She is a patient of Dr. Trevor Mace.  Pictures were obtained of the patient and placed in the chart with the patient's or guardian's permission.   [place], DO

## 2022-04-14 ENCOUNTER — Telehealth: Payer: Self-pay

## 2022-04-14 ENCOUNTER — Encounter: Payer: Self-pay | Admitting: Hematology and Oncology

## 2022-04-14 DIAGNOSIS — C50411 Malignant neoplasm of upper-outer quadrant of right female breast: Secondary | ICD-10-CM | POA: Diagnosis not present

## 2022-04-14 DIAGNOSIS — R234 Changes in skin texture: Secondary | ICD-10-CM | POA: Diagnosis not present

## 2022-04-14 NOTE — Telephone Encounter (Signed)
See previous phone note as that is related to this message.

## 2022-04-14 NOTE — Telephone Encounter (Signed)
Pt called to r/s infusion appts as she has a conflicting appt that day with a surgeon at [facility] for consult which would be very difficult to r/s. Per Julie Baton, RN OK to r/s infusion. Pt's appt changed and she is aware. Pt also asks for clarification on need for radiation. Per Julie Julie Atkinson last note, she will only need radiation if she has a lumpectomy. Pt states she learned yesterday via Julie Atkinson she is not a candidate for nipple sparing d/t placement of her cancer. She is seeing Julie Atkinson at [facility] tomorrow for further consult, then Julie Atkinson at [facility] 6/14. Pt knows to call with any further questions/concerns.

## 2022-04-15 DIAGNOSIS — N6311 Unspecified lump in the right breast, upper outer quadrant: Secondary | ICD-10-CM | POA: Diagnosis not present

## 2022-04-15 DIAGNOSIS — Z170 Estrogen receptor positive status [ER+]: Secondary | ICD-10-CM | POA: Diagnosis not present

## 2022-04-15 DIAGNOSIS — C50411 Malignant neoplasm of upper-outer quadrant of right female breast: Secondary | ICD-10-CM | POA: Diagnosis not present

## 2022-04-15 DIAGNOSIS — C50811 Malignant neoplasm of overlapping sites of right female breast: Secondary | ICD-10-CM | POA: Diagnosis not present

## 2022-04-15 DIAGNOSIS — N6489 Other specified disorders of breast: Secondary | ICD-10-CM | POA: Diagnosis not present

## 2022-04-21 ENCOUNTER — Other Ambulatory Visit: Payer: BC Managed Care – PPO

## 2022-04-21 ENCOUNTER — Ambulatory Visit: Payer: BC Managed Care – PPO | Admitting: Hematology and Oncology

## 2022-04-21 ENCOUNTER — Ambulatory Visit: Payer: BC Managed Care – PPO

## 2022-04-21 DIAGNOSIS — Z801 Family history of malignant neoplasm of trachea, bronchus and lung: Secondary | ICD-10-CM | POA: Diagnosis not present

## 2022-04-21 DIAGNOSIS — Z170 Estrogen receptor positive status [ER+]: Secondary | ICD-10-CM | POA: Diagnosis not present

## 2022-04-21 DIAGNOSIS — Z9221 Personal history of antineoplastic chemotherapy: Secondary | ICD-10-CM | POA: Diagnosis not present

## 2022-04-21 DIAGNOSIS — C50911 Malignant neoplasm of unspecified site of right female breast: Secondary | ICD-10-CM | POA: Diagnosis not present

## 2022-04-21 DIAGNOSIS — Z803 Family history of malignant neoplasm of breast: Secondary | ICD-10-CM | POA: Diagnosis not present

## 2022-04-21 DIAGNOSIS — Z6827 Body mass index (BMI) 27.0-27.9, adult: Secondary | ICD-10-CM | POA: Diagnosis not present

## 2022-04-22 ENCOUNTER — Inpatient Hospital Stay: Payer: BC Managed Care – PPO

## 2022-04-22 ENCOUNTER — Inpatient Hospital Stay: Payer: BC Managed Care – PPO | Attending: Hematology and Oncology | Admitting: Hematology and Oncology

## 2022-04-22 ENCOUNTER — Other Ambulatory Visit: Payer: Self-pay

## 2022-04-22 DIAGNOSIS — Z79899 Other long term (current) drug therapy: Secondary | ICD-10-CM | POA: Insufficient documentation

## 2022-04-22 DIAGNOSIS — Z7952 Long term (current) use of systemic steroids: Secondary | ICD-10-CM | POA: Insufficient documentation

## 2022-04-22 DIAGNOSIS — C50411 Malignant neoplasm of upper-outer quadrant of right female breast: Secondary | ICD-10-CM | POA: Diagnosis not present

## 2022-04-22 DIAGNOSIS — R197 Diarrhea, unspecified: Secondary | ICD-10-CM | POA: Insufficient documentation

## 2022-04-22 DIAGNOSIS — T451X5A Adverse effect of antineoplastic and immunosuppressive drugs, initial encounter: Secondary | ICD-10-CM | POA: Insufficient documentation

## 2022-04-22 DIAGNOSIS — Z95828 Presence of other vascular implants and grafts: Secondary | ICD-10-CM

## 2022-04-22 DIAGNOSIS — G620 Drug-induced polyneuropathy: Secondary | ICD-10-CM | POA: Diagnosis not present

## 2022-04-22 DIAGNOSIS — D6481 Anemia due to antineoplastic chemotherapy: Secondary | ICD-10-CM | POA: Diagnosis not present

## 2022-04-22 DIAGNOSIS — Z170 Estrogen receptor positive status [ER+]: Secondary | ICD-10-CM

## 2022-04-22 DIAGNOSIS — Z5112 Encounter for antineoplastic immunotherapy: Secondary | ICD-10-CM | POA: Insufficient documentation

## 2022-04-22 LAB — CMP (CANCER CENTER ONLY)
ALT: 16 U/L (ref 0–44)
AST: 18 U/L (ref 15–41)
Albumin: 4 g/dL (ref 3.5–5.0)
Alkaline Phosphatase: 67 U/L (ref 38–126)
Anion gap: 7 (ref 5–15)
BUN: 9 mg/dL (ref 6–20)
CO2: 26 mmol/L (ref 22–32)
Calcium: 8.9 mg/dL (ref 8.9–10.3)
Chloride: 104 mmol/L (ref 98–111)
Creatinine: 0.66 mg/dL (ref 0.44–1.00)
GFR, Estimated: >60 mL/min (ref >60)
Glucose, Bld: 80 mg/dL (ref 70–99)
Potassium: 3.6 mmol/L (ref 3.5–5.1)
Sodium: 137 mmol/L (ref 135–145)
Total Bilirubin: 0.5 mg/dL (ref 0.3–1.2)
Total Protein: 6.6 g/dL (ref 6.5–8.1)

## 2022-04-22 LAB — CBC WITH DIFFERENTIAL (CANCER CENTER ONLY)
Abs Immature Granulocytes: 0.02 10*3/uL (ref 0.00–0.07)
Basophils Absolute: 0.1 10*3/uL (ref 0.0–0.1)
Basophils Relative: 1 %
Eosinophils Absolute: 0 10*3/uL (ref 0.0–0.5)
Eosinophils Relative: 0 %
HCT: 35.3 % — ABNORMAL LOW (ref 36.0–46.0)
Hemoglobin: 12.4 g/dL (ref 12.0–15.0)
Immature Granulocytes: 0 %
Lymphocytes Relative: 28 %
Lymphs Abs: 1.4 10*3/uL (ref 0.7–4.0)
MCH: 35.2 pg — ABNORMAL HIGH (ref 26.0–34.0)
MCHC: 35.1 g/dL (ref 30.0–36.0)
MCV: 100.3 fL — ABNORMAL HIGH (ref 80.0–100.0)
Monocytes Absolute: 0.5 10*3/uL (ref 0.1–1.0)
Monocytes Relative: 9 %
Neutro Abs: 3.2 10*3/uL (ref 1.7–7.7)
Neutrophils Relative %: 62 %
Platelet Count: 173 10*3/uL (ref 150–400)
RBC: 3.52 MIL/uL — ABNORMAL LOW (ref 3.87–5.11)
RDW: 13.7 % (ref 11.5–15.5)
WBC Count: 5.2 10*3/uL (ref 4.0–10.5)
nRBC: 0 % (ref 0.0–0.2)

## 2022-04-22 MED ORDER — TRASTUZUMAB-DKST CHEMO 150 MG IV SOLR
6.0000 mg/kg | Freq: Once | INTRAVENOUS | Status: AC
  Administered 2022-04-22: 525 mg via INTRAVENOUS
  Filled 2022-04-22: qty 25

## 2022-04-22 MED ORDER — SODIUM CHLORIDE 0.9% FLUSH
10.0000 mL | INTRAVENOUS | Status: DC | PRN
  Administered 2022-04-22: 10 mL

## 2022-04-22 MED ORDER — SODIUM CHLORIDE 0.9% FLUSH
10.0000 mL | Freq: Once | INTRAVENOUS | Status: AC
  Administered 2022-04-22: 10 mL

## 2022-04-22 MED ORDER — DIPHENHYDRAMINE HCL 25 MG PO CAPS
50.0000 mg | ORAL_CAPSULE | Freq: Once | ORAL | Status: AC
  Administered 2022-04-22: 50 mg via ORAL
  Filled 2022-04-22: qty 2

## 2022-04-22 MED ORDER — HEPARIN SOD (PORK) LOCK FLUSH 100 UNIT/ML IV SOLN
500.0000 [IU] | Freq: Once | INTRAVENOUS | Status: AC | PRN
  Administered 2022-04-22: 500 [IU]

## 2022-04-22 MED ORDER — SODIUM CHLORIDE 0.9 % IV SOLN: Freq: Once | INTRAVENOUS | Status: AC

## 2022-04-22 MED ORDER — ACETAMINOPHEN 325 MG PO TABS
650.0000 mg | ORAL_TABLET | Freq: Once | ORAL | Status: AC
  Administered 2022-04-22: 650 mg via ORAL
  Filled 2022-04-22: qty 2

## 2022-04-22 NOTE — Patient Instructions (Signed)
[facility] MEDICAL [facility]COLOGY  Discharge Instructions: Thank you for choosing [facility] to provide your oncology and hematology care.   If you have a lab appointment with the Cancer Center, please go directly to the Cancer Center and check in at the registration area.   Wear comfortable clothing and clothing appropriate for easy access to any Portacath or PICC line.   We strive to give you quality time with your provider. You may need to reschedule your appointment if you arrive late (15 or more minutes).  Arriving late affects you and other patients whose appointments are after yours.  Also, if you miss three or more appointments without notifying the office, you may be dismissed from the clinic at the provider's discretion.      For prescription refill requests, have your pharmacy contact our office and allow 72 hours for refills to be completed.    Today you received the following chemotherapy and/or immunotherapy agents: Trastuzumab      To help prevent nausea and vomiting after your treatment, we encourage you to take your nausea medication as directed.  BELOW ARE SYMPTOMS THAT SHOULD BE REPORTED IMMEDIATELY: *FEVER GREATER THAN 100.4 F (38 C) OR HIGHER *CHILLS OR SWEATING *NAUSEA AND VOMITING THAT IS NOT CONTROLLED WITH YOUR NAUSEA MEDICATION *UNUSUAL SHORTNESS OF BREATH *UNUSUAL BRUISING OR BLEEDING *URINARY PROBLEMS (pain or burning when urinating, or frequent urination) *BOWEL PROBLEMS (unusual diarrhea, constipation, pain near the anus) TENDERNESS IN MOUTH AND THROAT WITH OR WITHOUT PRESENCE OF ULCERS (sore throat, sores in mouth, or a toothache) UNUSUAL RASH, SWELLING OR PAIN  UNUSUAL VAGINAL DISCHARGE OR ITCHING   Items with * indicate a potential emergency and should be followed up as soon as possible or go to the Emergency Department if any problems should occur.  Please show the CHEMOTHERAPY ALERT CARD or IMMUNOTHERAPY ALERT CARD at check-in  to the Emergency Department and triage nurse.  Should you have questions after your visit or need to cancel or reschedule your appointment, please contact [facility]  Dept: [phone]  and follow the prompts.  Office hours are 8:00 a.m. to 4:30 p.m. Monday - Friday. Please note that voicemails left after 4:00 p.m. may not be returned until the following business day.  We are closed weekends and major holidays. You have access to a nurse at all times for urgent questions. Please call the main number to the clinic Dept: [phone] and follow the prompts.   For any non-urgent questions, you may also contact your provider using MyChart. We now offer e-Visits for anyone 18 and older to request care online for non-urgent symptoms. For details visit [email or web address].   Also download the MyChart app! Go to the app store, search "MyChart", open the app, select [facility], and log in with your MyChart username and password.  Masks are optional in the cancer centers. If you would like for your care team to wear a mask while they are taking care of you, please let them know. For doctor visits, patients may have with them one support person who is at least 52 years old. At this time, visitors are not allowed in the infusion area. 

## 2022-04-22 NOTE — Progress Notes (Signed)
Patient Care Team: Nicholas Lose, MD as PCP - General (Hematology and Oncology) Rockwell Germany, RN as Oncology Nurse Navigator Mauro Kaufmann, RN as Oncology Nurse Navigator Coralie Keens, MD as Consulting Physician (General Surgery) Nicholas Lose, MD as Consulting Physician (Hematology and Oncology) Gery Pray, MD as Consulting Physician (Radiation Oncology)  DIAGNOSIS:  Encounter Diagnosis  Name Primary?   Malignant neoplasm of upper-outer quadrant of right breast in female, estrogen receptor positive ([place])     SUMMARY OF ONCOLOGIC HISTORY: Oncology History  Malignant neoplasm of upper-outer quadrant of right female breast ([place])  11/19/2021 Initial Diagnosis   Palpable lump for 3 weeks, diagnostic mammogram: 2.2 cm x 2.2 cm irregular mass with a spiculated margin in the upper outer right breast. 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 HER2 positive, Ki-67 15%   12/02/2021 Cancer Staging   Staging form: Breast, AJCC 8th Edition - Clinical stage from 12/02/2021: Stage IB (cT2, cN0, cM0, G2, ER+, PR+, HER2+) - Signed by Nicholas Lose, MD on 12/02/2021 Stage prefix: Initial diagnosis Histologic grading system: 3 grade system   12/16/2021 -  Chemotherapy   Patient is on Treatment Plan : BREAST  Docetaxel + Carboplatin + Trastuzumab + Pertuzumab  (TCHP) q21d      01/22/2022 Genetic Testing   Negative hereditary cancer genetic testing: no pathogenic variants detected in [place] +RNAinsight Panel.  Report date is 01/22/2022.   The CancerNext gene panel offered by [employer] includes sequencing, rearrangement analysis, and RNA analysis for the following 36 genes:   APC, ATM, AXIN2, BARD1, BMPR1A, BRCA1, BRCA2, BRIP1, CDH1, CDK4, CDKN2A, CHEK2, DICER1, HOXB13, EPCAM, GREM1, MLH1, MSH2, MSH3, MSH6, MUTYH, NBN, NF1, NTHL1, PALB2, PMS2, POLD1, POLE, PTEN, RAD51C, RAD51D, RECQL, SMAD4, SMARCA4, STK11, and TP53.      CHIEF COMPLIANT: Herceptin Maintenance  INTERVAL  HISTORY: Julie Atkinson is a 51 y.o. with above-mentioned history of right breast cancer, currently on chemotherapy with TCHP. She presents to the clinic today for treatment. States that her left arm and hand is asleep all the time. States that her finger tips in both hands is numb. States that she noticed it after second treatment.  ALLERGIES:  is allergic to codeine.  MEDICATIONS:  Current Outpatient Medications  Medication Sig Dispense Refill   Biotin (SUPER BIOTIN) 5 MG TABS by NG-Tube route daily before breakfast.     clindamycin (CLINDAGEL) 1 % gel Apply topically 2 (two) times daily. 30 g 0   clindamycin (CLINDAGEL) 1 % gel Apply topically.     diphenoxylate-atropine (LOMOTIL) 2.5-0.025 MG tablet Take 1 tablet by mouth 3 (three) times daily as needed for diarrhea or loose stools. 60 tablet 3   fluconazole (DIFLUCAN) 100 MG tablet      lidocaine-prilocaine (EMLA) cream Apply to affected area once 30 g 3   LORazepam (ATIVAN) 0.5 MG tablet Take 1 tablet (0.5 mg total) by mouth at bedtime as needed for anxiety. 30 tablet 0   magic mouthwash SOLN SWISH AND EXPECTORATE FIVE ML THREE TIMES DAILY AS NEEDED MOUTH FOR PAIN     omeprazole (PRILOSEC OTC) 20 MG tablet Take 1 tablet by mouth daily.     predniSONE (DELTASONE) 10 MG tablet prednisone 10 mg tablet  take 1 tab three times a day for 2 days, 1 tab twice a day for 5 days, 1 tab daily till finished     traZODone (DESYREL) 50 MG tablet Take 0.5-1 tablets (25-50 mg total) by mouth at bedtime as needed for  sleep.     No current facility-administered medications for this visit.    PHYSICAL EXAMINATION: ECOG PERFORMANCE STATUS: 1 - Symptomatic but completely ambulatory  Vitals:   04/22/22 1003  BP: (!) 135/98  Pulse: 72  Resp: 17  Temp: (!) 97.3 F (36.3 C)  SpO2: 100%   Filed Weights   04/22/22 1003  Weight: 181 lb 3.2 oz (82.2 kg)     LABORATORY DATA:  I have reviewed the data as listed    Latest Ref Rng & Units 04/22/2022     9:42 AM 03/31/2022    9:33 AM 03/10/2022    9:44 AM  CMP  Glucose 70 - 99 mg/dL 80  108  138   BUN 6 - 20 mg/dL _0 Creatinine 0.44 - 1.00 mg/dL 0.66  0.67  0.68   Sodium 135 - 145 mmol/L 137  135  136   Potassium 3.5 - 5.1 mmol/L 3.6  4.3  3.7   Chloride 98 - 111 mmol/L 104  103  105   CO2 22 - 32 mmol/L _1 Calcium 8.9 - 10.3 mg/dL 8.9  9.4  9.1   Total Protein 6.5 - 8.1 g/dL 6.6  7.4  6.8   Total Bilirubin 0.3 - 1.2 mg/dL 0.5  0.6  0.5   Alkaline Phos 38 - 126 U/L 67  67  62   AST 15 - 41 U/L _2 ALT 0 - 44 U/L _3 Lab Results  Component Value Date   WBC 5.2 04/22/2022   HGB 12.4 04/22/2022   HCT 35.3 (L) 04/22/2022   MCV 100.3 (H) 04/22/2022   PLT 173 04/22/2022   NEUTROABS 3.2 04/22/2022    ASSESSMENT & PLAN:  Malignant neoplasm of upper-outer quadrant of right female breast ([place]) 11/19/2021:Palpable lump for 3 weeks, diagnostic mammogram: 2.2 cm x 2.2 cm irregular mass with a spiculated margin in the upper outer right breast. 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 HER2 positive, Ki-67 15% MRI Breast 12/09/21:  Right Breast retroareolar mass 6.7 cm (involves nipple areolar complex), Rt axilla 2.1 cm node, Left breast: Several scattered foci left breast indeterminate 0.6 cm mass LIQ left breast and another indeterminate 0.6 cm mass in upper central Left breast (biopsies recommended: rt axillary LN, Left Breast indeterminate mass)   Treatment Plan: 1. Neoadjuvant chemotherapy with TCHP foll by HP vs Kadcyla maintenance 2. Followed by Mastectomy vs Large Lumpectomy 3. Followed by adjuvant radiation therapy (If lumpectomy) ------------------------------------------------------------------------------------------------------------------------------------ Current Treatment: completed Cycle 6 TCHP (dose has been reduced), today is HP maintenance Chemo toxicities: Diarrhea: Intermittently.  3-5 loose stools per day.  She does not like  to take medications but she is taking Lomotil on and off. Chemotherapy-induced anemia: Monitoring closely Mild nausea Severe fatigue Chemo-induced peripheral neuropathy: Neuropathy is up to the middle of her fingers.  Therefore reduce the Taxotere to 50 mg per metered squared and carboplatin down as well.   Breast MRI 02/26/2022: Very good response to the right breast cancer measuring 0.7 cm, non-mass enhancement much less intense (previously was 7 cm), decrease in the right axillary lymph node   Breast MRI  04/09/22: Significant Pos response to chemo with NME over the areas of malignancy rt Breast ext to retroareolar region, Rt Axill LN decreased and measures 1.1 cm  RTC in 3 weeks for Herceptin and Perjeta maintenance.  She went to meet with surgery at [facility] as well as at [facility]. Dr.Chiba suggested BCS  Dr.Blackman thought she would need mastectomy Dr.Gallagher felt something at bottom of nipple and recommended mastectomy  She might be getting her surgery with Dr.Chiba.   No orders of the defined types were placed in this encounter.  The patient has a good understanding of the overall plan. she agrees with it. she will call with any problems that may develop before the next visit here. Total time spent: 30 mins including face to face time and time spent for planning, charting and co-ordination of care   Harriette Ohara, MD 04/22/22    I Gardiner Coins am scribing for Dr. Lindi Adie  I have reviewed the above documentation for accuracy and completeness, and I agree with the above.

## 2022-04-22 NOTE — Assessment & Plan Note (Addendum)
11/19/2021:Palpable lump for 3 weeks, diagnostic mammogram: 2.2 cm x 2.2 cm irregular mass with a spiculated margin in the upper outer right breast. 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 HER2 positive, Ki-67 15% MRI Breast 12/09/21: Right Breast retroareolar mass 6.7 cm (involves nipple areolar complex), Rt axilla 2.1 cm node, Left breast: Several scattered foci left breast indeterminate 0.6 cm mass LIQ left breast and another indeterminate 0.6 cm mass in upper central Left breast (biopsies recommended: rt axillary LN, Left Breast indeterminate mass)  Treatment Plan: 1. Neoadjuvant chemotherapy withTCHP foll by HP vs Kadcyla maintenance 2. Followed by Mastectomy vs Large Lumpectomy 3. Followed by adjuvant radiation therapy (If lumpectomy) ------------------------------------------------------------------------------------------------------------------------------------ Current Treatment: completed Cycle6TCHP(dose has been reduced), today is HP maintenance Chemo toxicities: 1. Diarrhea: Intermittently. 3-5 loose stools per day. She does not like to take medications but she is taking Lomotil on and off. 2. Chemotherapy-induced anemia: Monitoring closely 3. Mild nausea 4. Severe fatigue 5. Chemo-induced peripheral neuropathy: Neuropathy is up to the middle of her fingers. Therefore reduce the Taxotere to 50 mg per metered squared and carboplatin down as well.  Breast MRI 02/26/2022: Very good response to the right breast cancer measuring 0.7 cm, non-mass enhancement much less intense (previously was 7 cm), decrease in the right axillary lymph node  Breast MRI  04/09/22: Significant Pos response to chemo with NME over the areas of malignancy rt Breast ext to retroareolar region, Rt Axill LN decreased and measures 1.1 cm  RTC in 3 weeks for Herceptin and Perjeta maintenance.  She went to meet with surgery at [facility] as well as at [facility]. Dr.Chiba suggested BCS  Dr.Blackman thought she  would need mastectomy Dr.Gallagher felt something at bottom of nipple and recommended mastectomy  She might be getting her surgery with Dr.Chiba.

## 2022-04-23 ENCOUNTER — Encounter: Payer: Self-pay | Admitting: *Deleted

## 2022-04-23 ENCOUNTER — Telehealth: Payer: Self-pay | Admitting: Hematology and Oncology

## 2022-04-23 DIAGNOSIS — Z170 Estrogen receptor positive status [ER+]: Secondary | ICD-10-CM

## 2022-04-23 NOTE — Telephone Encounter (Signed)
Scheduled appointment per 6/15 los. Patient is aware.

## 2022-04-27 ENCOUNTER — Encounter: Payer: Self-pay | Admitting: *Deleted

## 2022-04-27 ENCOUNTER — Encounter: Payer: Self-pay | Admitting: Registered Nurse

## 2022-04-28 DIAGNOSIS — C50411 Malignant neoplasm of upper-outer quadrant of right female breast: Secondary | ICD-10-CM | POA: Diagnosis not present

## 2022-05-03 HISTORY — PX: BREAST LUMPECTOMY WITH RADIOACTIVE SEED AND AXILLARY LYMPH NODE DISSECTION: SHX6656

## 2022-05-04 DIAGNOSIS — Z170 Estrogen receptor positive status [ER+]: Secondary | ICD-10-CM | POA: Diagnosis not present

## 2022-05-04 DIAGNOSIS — C50411 Malignant neoplasm of upper-outer quadrant of right female breast: Secondary | ICD-10-CM | POA: Diagnosis not present

## 2022-05-04 DIAGNOSIS — Z87891 Personal history of nicotine dependence: Secondary | ICD-10-CM | POA: Diagnosis not present

## 2022-05-04 DIAGNOSIS — N6311 Unspecified lump in the right breast, upper outer quadrant: Secondary | ICD-10-CM | POA: Diagnosis not present

## 2022-05-04 DIAGNOSIS — N6092 Unspecified benign mammary dysplasia of left breast: Secondary | ICD-10-CM | POA: Diagnosis not present

## 2022-05-04 DIAGNOSIS — N6324 Unspecified lump in the left breast, lower inner quadrant: Secondary | ICD-10-CM | POA: Diagnosis not present

## 2022-05-05 DIAGNOSIS — Z79899 Other long term (current) drug therapy: Secondary | ICD-10-CM | POA: Diagnosis not present

## 2022-05-05 DIAGNOSIS — G629 Polyneuropathy, unspecified: Secondary | ICD-10-CM | POA: Diagnosis not present

## 2022-05-05 DIAGNOSIS — Z9221 Personal history of antineoplastic chemotherapy: Secondary | ICD-10-CM | POA: Diagnosis not present

## 2022-05-05 DIAGNOSIS — D649 Anemia, unspecified: Secondary | ICD-10-CM | POA: Diagnosis not present

## 2022-05-05 DIAGNOSIS — E663 Overweight: Secondary | ICD-10-CM | POA: Diagnosis not present

## 2022-05-05 DIAGNOSIS — Z6827 Body mass index (BMI) 27.0-27.9, adult: Secondary | ICD-10-CM | POA: Diagnosis not present

## 2022-05-05 DIAGNOSIS — F4310 Post-traumatic stress disorder, unspecified: Secondary | ICD-10-CM | POA: Diagnosis not present

## 2022-05-05 DIAGNOSIS — Z9889 Other specified postprocedural states: Secondary | ICD-10-CM | POA: Diagnosis not present

## 2022-05-05 DIAGNOSIS — K219 Gastro-esophageal reflux disease without esophagitis: Secondary | ICD-10-CM | POA: Diagnosis not present

## 2022-05-05 DIAGNOSIS — N6092 Unspecified benign mammary dysplasia of left breast: Secondary | ICD-10-CM | POA: Diagnosis not present

## 2022-05-05 DIAGNOSIS — Z87891 Personal history of nicotine dependence: Secondary | ICD-10-CM | POA: Diagnosis not present

## 2022-05-05 DIAGNOSIS — I517 Cardiomegaly: Secondary | ICD-10-CM | POA: Diagnosis not present

## 2022-05-05 DIAGNOSIS — Z170 Estrogen receptor positive status [ER+]: Secondary | ICD-10-CM | POA: Diagnosis not present

## 2022-05-05 DIAGNOSIS — N6011 Diffuse cystic mastopathy of right breast: Secondary | ICD-10-CM | POA: Diagnosis not present

## 2022-05-05 DIAGNOSIS — C50411 Malignant neoplasm of upper-outer quadrant of right female breast: Secondary | ICD-10-CM | POA: Diagnosis not present

## 2022-05-05 DIAGNOSIS — Z95828 Presence of other vascular implants and grafts: Secondary | ICD-10-CM | POA: Diagnosis not present

## 2022-05-05 DIAGNOSIS — N6041 Mammary duct ectasia of right breast: Secondary | ICD-10-CM | POA: Diagnosis not present

## 2022-05-05 DIAGNOSIS — Z4001 Encounter for prophylactic removal of breast: Secondary | ICD-10-CM | POA: Diagnosis not present

## 2022-05-05 NOTE — Progress Notes (Signed)
HEMATOLOGY-ONCOLOGY [place] VISIT PROGRESS NOTE  I connected with Julie Atkinson on 05/19/2022 at 10:15 AM EDT by Mychart video conference and verified that I am speaking with the correct person using two identifiers.  I discussed the limitations, risks, security and privacy concerns of performing an evaluation and management service by Webex and the availability of in person appointments.  I also discussed with the patient that there may be a patient responsible charge related to this service. The patient expressed understanding and agreed to proceed.  Patient's Location: Home Physician Location: Clinic  CHIEF COMPLIANT: Follow-up to discuss results of recent lumpectomies  INTERVAL HISTORY: Julie Atkinson is a 51 y.o. female with above-mentioned history of 51 y.o. with above-mentioned history of right breast cancer, currently on chemotherapy with TCHP. She presents to the clinic today for follow-up.  She is recovering very well from recent surgery with exception of under the arm where she is having a lot of pain and discomfort and burning sensation.  Otherwise the breast appears to be healing very well.  She has an appointment to see Dr. Genia Hotter tomorrow.  Oncology History  Malignant neoplasm of upper-outer quadrant of right female breast ([place])  11/19/2021 Initial Diagnosis   Palpable lump for 3 weeks, diagnostic mammogram: 2.2 cm x 2.2 cm irregular mass with a spiculated margin in the upper outer right breast. 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 HER2 positive, Ki-67 15%   12/02/2021 Cancer Staging   Staging form: Breast, AJCC 8th Edition - Clinical stage from 12/02/2021: Stage IB (cT2, cN0, cM0, G2, ER+, PR+, HER2+) - Signed by Nicholas Lose, MD on 12/02/2021 Stage prefix: Initial diagnosis Histologic grading system: 3 grade system   12/16/2021 - 05/12/2022 Chemotherapy   Patient is on Treatment Plan : BREAST  Docetaxel + Carboplatin + Trastuzumab + Pertuzumab  (TCHP) q21d       01/22/2022 Genetic Testing   Negative hereditary cancer genetic testing: no pathogenic variants detected in [place] +RNAinsight Panel.  Report date is 01/22/2022.   The CancerNext gene panel offered by [employer] includes sequencing, rearrangement analysis, and RNA analysis for the following 36 genes:   APC, ATM, AXIN2, BARD1, BMPR1A, BRCA1, BRCA2, BRIP1, CDH1, CDK4, CDKN2A, CHEK2, DICER1, HOXB13, EPCAM, GREM1, MLH1, MSH2, MSH3, MSH6, MUTYH, NBN, NF1, NTHL1, PALB2, PMS2, POLD1, POLE, PTEN, RAD51C, RAD51D, RECQL, SMAD4, SMARCA4, STK11, and TP53.    05/05/2022 Surgery   Rt retroareolar lumpectomy and Rt UOQ lumpectomy: No residual cancer, Lt Lumpectomy: Benign Complete Pathological Response   06/04/2022 -  Chemotherapy   Patient is on Treatment Plan : BREAST Trastuzumab  + Pertuzumab q21d x 13 cycles       REVIEW OF SYSTEMS:   Constitutional: Denies fevers, chills or abnormal weight loss   All other systems were reviewed with the patient and are negative.  Observations/Objective:  There were no vitals filed for this visit. There is no height or weight on file to calculate BMI.  I have reviewed the data as listed    Latest Ref Rng & Units 04/22/2022    9:42 AM 03/31/2022    9:33 AM 03/10/2022    9:44 AM  CMP  Glucose 70 - 99 mg/dL 80  108  138   BUN 6 - 20 mg/dL _0 Creatinine 0.44 - 1.00 mg/dL 0.66  0.67  0.68   Sodium 135 - 145 mmol/L 137  135  136   Potassium 3.5 - 5.1 mmol/L 3.6  4.3  3.7   Chloride 98 - 111 mmol/L 104  103  105   CO2 22 - 32 mmol/L _0 Calcium 8.9 - 10.3 mg/dL 8.9  9.4  9.1   Total Protein 6.5 - 8.1 g/dL 6.6  7.4  6.8   Total Bilirubin 0.3 - 1.2 mg/dL 0.5  0.6  0.5   Alkaline Phos 38 - 126 U/L 67  67  62   AST 15 - 41 U/L _1 ALT 0 - 44 U/L _2 Lab Results  Component Value Date   WBC 5.2 04/22/2022   HGB 12.4 04/22/2022   HCT 35.3 (L) 04/22/2022   MCV 100.3 (H) 04/22/2022   PLT 173 04/22/2022   NEUTROABS  3.2 04/22/2022      Assessment Plan:  Malignant neoplasm of upper-outer quadrant of right female breast ([place]) 11/19/2021:Palpable lump for 3 weeks, diagnostic mammogram: 2.2 cm x 2.2 cm irregular mass with a spiculated margin in the upper outer right breast. 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 HER2 positive, Ki-67 15% MRI Breast 12/09/21:  Right Breast retroareolar mass 6.7 cm (involves nipple areolar complex), Rt axilla 2.1 cm node, Left breast: Several scattered foci left breast indeterminate 0.6 cm mass LIQ left breast and another indeterminate 0.6 cm mass in upper central Left breast (biopsies recommended: rt axillary LN, Left Breast indeterminate mass)   Treatment Plan: 1. Neoadjuvant chemotherapy with TCHP foll by HP vs Kadcyla maintenance 2. Followed by Mastectomy vs Large Lumpectomy 3. Followed by adjuvant radiation therapy (If lumpectomy) ------------------------------------------------------------------------------------------------------------------------------------ Current Treatment: completed Cycle 6 TCHP (dose has been reduced) Chemo toxicities: Chemo-induced peripheral neuropathy Gastrointestinal adverse effects: We discussed the role of maintenance therapy between Herceptin versus Herceptin with Perjeta.  We discussed that the benefit of adding Perjeta is extremely small however if she would like to receive it we can see if she can tolerate it.   Breast MRI  04/09/22: Significant Pos response to chemo with NME over the areas of malignancy rt Breast ext to retroareolar region, Rt Axill LN decreased and measures 1.1 cm  05/05/22: Bilateral lumpectomies: Path CR   RTC in 3 weeks for Herceptin and Perjeta maintenance and every 6 weeks for follow-up with me.    I discussed the assessment and treatment plan with the patient. The patient was provided an opportunity to ask questions and all were answered. The patient agreed with the plan and demonstrated an understanding of  the instructions. The patient was advised to call back or seek an in-person evaluation if the symptoms worsen or if the condition fails to improve as anticipated.   I provided 30 minutes of face-to-face Web Ex time during this encounter.    Rulon Eisenmenger, MD 05/19/2022  I Gardiner Coins am scribing for Dr. Lindi Adie  I have reviewed the above documentation for accuracy and completeness, and I agree with the above.

## 2022-05-12 ENCOUNTER — Other Ambulatory Visit: Payer: Self-pay

## 2022-05-12 ENCOUNTER — Inpatient Hospital Stay: Payer: BC Managed Care – PPO | Attending: Hematology and Oncology

## 2022-05-12 ENCOUNTER — Inpatient Hospital Stay (HOSPITAL_BASED_OUTPATIENT_CLINIC_OR_DEPARTMENT_OTHER): Payer: BC Managed Care – PPO | Admitting: Physician Assistant

## 2022-05-12 VITALS — BP 151/96 | HR 76 | Temp 97.7°F | Resp 17 | Wt 181.3 lb

## 2022-05-12 DIAGNOSIS — Z87891 Personal history of nicotine dependence: Secondary | ICD-10-CM | POA: Insufficient documentation

## 2022-05-12 DIAGNOSIS — Z5112 Encounter for antineoplastic immunotherapy: Secondary | ICD-10-CM | POA: Insufficient documentation

## 2022-05-12 DIAGNOSIS — L7682 Other postprocedural complications of skin and subcutaneous tissue: Secondary | ICD-10-CM

## 2022-05-12 DIAGNOSIS — C50411 Malignant neoplasm of upper-outer quadrant of right female breast: Secondary | ICD-10-CM | POA: Diagnosis not present

## 2022-05-12 DIAGNOSIS — Z170 Estrogen receptor positive status [ER+]: Secondary | ICD-10-CM

## 2022-05-12 DIAGNOSIS — Z95828 Presence of other vascular implants and grafts: Secondary | ICD-10-CM

## 2022-05-12 MED ORDER — ACETAMINOPHEN 325 MG PO TABS
650.0000 mg | ORAL_TABLET | Freq: Once | ORAL | Status: AC
  Administered 2022-05-12: 650 mg via ORAL
  Filled 2022-05-12: qty 2

## 2022-05-12 MED ORDER — SODIUM CHLORIDE 0.9 % IV SOLN: Freq: Once | INTRAVENOUS | Status: AC

## 2022-05-12 MED ORDER — LORATADINE 10 MG PO TABS
10.0000 mg | ORAL_TABLET | Freq: Once | ORAL | Status: AC
  Administered 2022-05-12: 10 mg via ORAL
  Filled 2022-05-12: qty 1

## 2022-05-12 MED ORDER — HEPARIN SOD (PORK) LOCK FLUSH 100 UNIT/ML IV SOLN
500.0000 [IU] | Freq: Once | INTRAVENOUS | Status: AC | PRN
  Administered 2022-05-12: 500 [IU]

## 2022-05-12 MED ORDER — TRASTUZUMAB-DKST CHEMO 150 MG IV SOLR
6.0000 mg/kg | Freq: Once | INTRAVENOUS | Status: AC
  Administered 2022-05-12: 525 mg via INTRAVENOUS
  Filled 2022-05-12: qty 25

## 2022-05-12 MED ORDER — SODIUM CHLORIDE 0.9% FLUSH
10.0000 mL | INTRAVENOUS | Status: DC | PRN
  Administered 2022-05-12: 10 mL

## 2022-05-12 MED ORDER — DIPHENHYDRAMINE HCL 25 MG PO CAPS: 50.0000 mg | ORAL_CAPSULE | Freq: Once | ORAL | Status: DC

## 2022-05-12 MED ORDER — HYDROCODONE-ACETAMINOPHEN 5-325 MG PO TABS
1.0000 | ORAL_TABLET | Freq: Four times a day (QID) | ORAL | 0 refills | Status: AC | PRN
Start: 2022-05-12 — End: 2022-05-17

## 2022-05-12 NOTE — Progress Notes (Signed)
Patient c/o pain to incision site.  It is not red and does not appear to have any drainage.  Site appears minimally swollen.  Pt reports tenderness to touch and "pain is getting worse"  Anda Kraft, [place] to see patient in infusion clinic
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
## 2022-05-12 NOTE — Patient Instructions (Signed)
[facility] ONCOLOGY  Discharge Instructions: Thank you for choosing [facility] to provide your oncology and hematology care.   If you have a lab appointment with the [facility], please go directly to the [facility] and check in at the registration area.   Wear comfortable clothing and clothing appropriate for easy access to any Portacath or PICC line.   We strive to give you quality time with your provider. You may need to reschedule your appointment if you arrive late (15 or more minutes).  Arriving late affects you and other patients whose appointments are after yours.  Also, if you miss three or more appointments without notifying the office, you may be dismissed from the clinic at the provider's discretion.      For prescription refill requests, have your pharmacy contact our office and allow 72 hours for refills to be completed.    Today you received the following chemotherapy and/or immunotherapy agents: Ogivri      To help prevent nausea and vomiting after your treatment, we encourage you to take your nausea medication as directed.  BELOW ARE SYMPTOMS THAT SHOULD BE REPORTED IMMEDIATELY: *FEVER GREATER THAN 100.4 F (38 C) OR HIGHER *CHILLS OR SWEATING *NAUSEA AND VOMITING THAT IS NOT CONTROLLED WITH YOUR NAUSEA MEDICATION *UNUSUAL SHORTNESS OF BREATH *UNUSUAL BRUISING OR BLEEDING *URINARY PROBLEMS (pain or burning when urinating, or frequent urination) *BOWEL PROBLEMS (unusual diarrhea, constipation, pain near the anus) TENDERNESS IN MOUTH AND THROAT WITH OR WITHOUT PRESENCE OF ULCERS (sore throat, sores in mouth, or a toothache) UNUSUAL RASH, SWELLING OR PAIN  UNUSUAL VAGINAL DISCHARGE OR ITCHING   Items with * indicate a potential emergency and should be followed up as soon as possible or go to the Emergency Department if any problems should occur.  Please show the CHEMOTHERAPY ALERT CARD or IMMUNOTHERAPY ALERT CARD at check-in to the  Emergency Department and triage nurse.  Should you have questions after your visit or need to cancel or reschedule your appointment, please contact [facility]  Dept: [phone]  and follow the prompts.  Office hours are 8:00 a.m. to 4:30 p.m. Monday - Friday. Please note that voicemails left after 4:00 p.m. may not be returned until the following business day.  We are closed weekends and major holidays. You have access to a nurse at all times for urgent questions. Please call the main number to the clinic Dept: [phone] and follow the prompts.   For any non-urgent questions, you may also contact your provider using MyChart. We now offer e-Visits for anyone 52 and older to request care online for non-urgent symptoms. For details visit [email or web address].   Also download the MyChart app! Go to the app store, search "MyChart", open the app, select [facility], and log in with your MyChart username and password.  Masks are optional in the cancer centers. If you would like for your care team to wear a mask while they are taking care of you, please let them know. For doctor visits, patients may have with them one support person who is at least 52 years old. At this time, visitors are not allowed in the infusion area.

## 2022-05-12 NOTE — Progress Notes (Signed)
Per Dr Lindi Adie, ok to hold benadryl and replace with Claritin per patients request

## 2022-05-12 NOTE — Progress Notes (Signed)
Per pharmacist, labs not needed with Herceptin and will proceed today

## 2022-05-19 ENCOUNTER — Inpatient Hospital Stay (HOSPITAL_BASED_OUTPATIENT_CLINIC_OR_DEPARTMENT_OTHER): Payer: BC Managed Care – PPO | Admitting: Hematology and Oncology

## 2022-05-19 DIAGNOSIS — Z170 Estrogen receptor positive status [ER+]: Secondary | ICD-10-CM | POA: Diagnosis not present

## 2022-05-19 DIAGNOSIS — C50411 Malignant neoplasm of upper-outer quadrant of right female breast: Secondary | ICD-10-CM

## 2022-05-19 NOTE — Progress Notes (Signed)
DISCONTINUE ON PATHWAY REGIMEN - Breast     Cycle 1: A cycle is 21 days:     Pertuzumab      Trastuzumab-xxxx      Docetaxel      Carboplatin    Cycles 2 through 6: A cycle is every 21 days:     Pertuzumab      Trastuzumab-xxxx      Docetaxel      Carboplatin   **Always confirm dose/schedule in your pharmacy ordering system**  REASON: Other Reason PRIOR TREATMENT: [id number]: Docetaxel + Carboplatin + Trastuzumab IV + Pertuzumab IV (TCHP IV) q21 Days x 6 Cycles TREATMENT RESPONSE: Complete Response (CR)  START ON PATHWAY REGIMEN - Breast     A cycle is every 21 days:     Trastuzumab-xxxx      Pertuzumab   **Always confirm dose/schedule in your pharmacy ordering system**  Patient Characteristics: Post-Neoadjuvant Therapy and Resection, HER2 Positive, ER Positive, No Residual Disease, Adjuvant Targeted Therapy After Neoadjuvant Chemo/Targeted Therapy Therapeutic Status: Post-Neoadjuvant Therapy and Resection Residual Invasive Disease Post-Neoadjuvant Therapy<= No ER Status: Positive (+) HER2 Status: Positive (+) PR Status: Positive (+) Intent of Therapy: Curative Intent, Discussed with Patient

## 2022-05-19 NOTE — Assessment & Plan Note (Signed)
11/19/2021:Palpable lump for 3 weeks, diagnostic mammogram: 2.2 cm x 2.2 cm irregular mass with a spiculated margin in the upper outer right breast. 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 HER2 positive, Ki-67 15% MRI Breast 12/09/21: Right Breast retroareolar mass 6.7 cm (involves nipple areolar complex), Rt axilla 2.1 cm node, Left breast: Several scattered foci left breast indeterminate 0.6 cm mass LIQ left breast and another indeterminate 0.6 cm mass in upper central Left breast (biopsies recommended: rt axillary LN, Left Breast indeterminate mass)  Treatment Plan: 1. Neoadjuvant chemotherapy withTCHP foll by HP vs Kadcyla maintenance 2. Followed byMastectomy vs Large Lumpectomy 3. Followed by adjuvant radiation therapy(If lumpectomy) ------------------------------------------------------------------------------------------------------------------------------------ Current Treatment: completed Cycle6TCHP(dose has been reduced), currently on HP maintenance Chemo toxicities: 1. Chemo-induced peripheral neuropathy  Breast MRI  04/09/22: Significant Pos response to chemo with NME over the areas of malignancy rt Breast ext to retroareolar region, Rt Axill LN decreased and measures 1.1 cm  05/05/22: Bilateral lumpectomies: Path CR  RTCin 3 weeks for Herceptin and Perjeta maintenance.

## 2022-05-20 DIAGNOSIS — C50411 Malignant neoplasm of upper-outer quadrant of right female breast: Secondary | ICD-10-CM | POA: Diagnosis not present

## 2022-05-20 DIAGNOSIS — Z170 Estrogen receptor positive status [ER+]: Secondary | ICD-10-CM | POA: Diagnosis not present

## 2022-05-21 ENCOUNTER — Telehealth: Payer: Self-pay | Admitting: Hematology and Oncology

## 2022-05-21 NOTE — Telephone Encounter (Signed)
Scheduled appointment per 7/12 los. Left voicemail.

## 2022-05-24 DIAGNOSIS — N6092 Unspecified benign mammary dysplasia of left breast: Secondary | ICD-10-CM | POA: Diagnosis not present

## 2022-05-24 DIAGNOSIS — Z87891 Personal history of nicotine dependence: Secondary | ICD-10-CM | POA: Diagnosis not present

## 2022-05-24 DIAGNOSIS — F4310 Post-traumatic stress disorder, unspecified: Secondary | ICD-10-CM | POA: Diagnosis not present

## 2022-05-24 DIAGNOSIS — K219 Gastro-esophageal reflux disease without esophagitis: Secondary | ICD-10-CM | POA: Diagnosis not present

## 2022-05-24 DIAGNOSIS — N6011 Diffuse cystic mastopathy of right breast: Secondary | ICD-10-CM | POA: Diagnosis not present

## 2022-05-24 DIAGNOSIS — Z9221 Personal history of antineoplastic chemotherapy: Secondary | ICD-10-CM | POA: Diagnosis not present

## 2022-05-24 DIAGNOSIS — M96842 Postprocedural seroma of a musculoskeletal structure following a musculoskeletal system procedure: Secondary | ICD-10-CM | POA: Diagnosis not present

## 2022-05-24 DIAGNOSIS — Z9889 Other specified postprocedural states: Secondary | ICD-10-CM | POA: Diagnosis not present

## 2022-05-24 DIAGNOSIS — Z483 Aftercare following surgery for neoplasm: Secondary | ICD-10-CM | POA: Diagnosis not present

## 2022-05-24 DIAGNOSIS — Z79899 Other long term (current) drug therapy: Secondary | ICD-10-CM | POA: Diagnosis not present

## 2022-05-24 DIAGNOSIS — E663 Overweight: Secondary | ICD-10-CM | POA: Diagnosis not present

## 2022-05-24 DIAGNOSIS — F419 Anxiety disorder, unspecified: Secondary | ICD-10-CM | POA: Diagnosis not present

## 2022-05-24 DIAGNOSIS — Z421 Encounter for breast reconstruction following mastectomy: Secondary | ICD-10-CM | POA: Diagnosis not present

## 2022-05-24 DIAGNOSIS — Z6826 Body mass index (BMI) 26.0-26.9, adult: Secondary | ICD-10-CM | POA: Diagnosis not present

## 2022-05-24 DIAGNOSIS — N6012 Diffuse cystic mastopathy of left breast: Secondary | ICD-10-CM | POA: Diagnosis not present

## 2022-05-24 DIAGNOSIS — G629 Polyneuropathy, unspecified: Secondary | ICD-10-CM | POA: Diagnosis not present

## 2022-05-24 DIAGNOSIS — Z853 Personal history of malignant neoplasm of breast: Secondary | ICD-10-CM | POA: Diagnosis not present

## 2022-05-28 NOTE — Progress Notes (Signed)
Location of Breast Cancer:upper-outer quadrant of right breast    Histology per Pathology Report:   Diagnosis 1. Breast, right, needle core biopsy, 10:00 6cmfn, breast mass - INVASIVE MAMMARY CARCINOMA, SEE NOTE - MAMMARY CARCINOMA IN SITU 2. Breast, right, needle core biopsy, breast, intraductal mass, retroareolar - INVASIVE MAMMARY CARCINOMA, SEE NOTE  Receptor Status: ER(60%), PR (60%), Her2-neu (positive), Ki-(15%)  Did patient present with symptoms (if so, please note symptoms) or was this found on screening mammography?: palpable mass  Past/Anticipated interventions by surgeon, if any:Rt retroareolar lumpectomy and Rt UOQ lumpectomy 05/05/2022, RIGHT ONCOPLASTIC BREAST RECONSTRUCTION USING REDUCTION MAMMAPLASTY TECHNIQUE, LEFT SYMMATRIZING BREAST REDUCTION MAMMAPLASTY (Bilateral) 05/24/2022  Past/Anticipated interventions by medical oncology, if any: Docetaxel + Carboplatin + Trastuzumab + Pertuzumab  (TCHP) q21d  12/16/2021 - 05/12/2022.  Will start Trastuzumab  + Pertuzumab q21d x 13 cycles to start on 06/04/2022   Lymphedema issues, if any:  no    Pain issues, if any:  yes - 5/10 in her incisions  SAFETY ISSUES: Prior radiation? yes Pacemaker/ICD? no Possible current pregnancy?no Is the patient on methotrexate? no  Current Complaints / other details:  Patient would like to be treated in the prone position.  She is also worried about when she can be treated due to her work schedule - ideally first thing in the morning.  BP 114/84 (BP Location: Left Arm, Patient Position: Sitting)   Pulse 76   Temp (!) 96.9 F (36.1 C) (Temporal)   Resp 18   Ht 5' 7"  (1.702 m)   Wt 178 lb 2 oz (80.8 kg)   SpO2 99%   BMI 27.90 kg/m      Jacqulyn Liner, RN 05/28/2022,12:37 PM

## 2022-05-31 ENCOUNTER — Other Ambulatory Visit: Payer: Self-pay

## 2022-06-01 DIAGNOSIS — C50411 Malignant neoplasm of upper-outer quadrant of right female breast: Secondary | ICD-10-CM | POA: Diagnosis not present

## 2022-06-01 DIAGNOSIS — Z170 Estrogen receptor positive status [ER+]: Secondary | ICD-10-CM | POA: Diagnosis not present

## 2022-06-01 NOTE — Progress Notes (Signed)
Radiation Oncology         [phone] ________________________________  Name: Julie Atkinson MRN: [id number]  Date: 06/02/2022  DOB: 1971-07-24  Re-Evaluation Note  CC: Nicholas Lose, MD  Nicholas Lose, MD  No diagnosis found.  Diagnosis:   No residual invasive carcinoma s/p chemotherapy and bilateral lumpectomies    Cancer Staging  Malignant neoplasm of upper-outer quadrant of right female breast [facility]) Staging form: Breast, AJCC 8th Edition - Clinical stage from 12/02/2021: Stage IB (cT2, cN0, cM0, G2, ER+, PR+, HER2+) - Signed by Nicholas Lose, MD on 12/02/2021   Narrative:  The patient returns today to discuss radiation treatment options. She was seen in the multidisciplinary breast clinic on 12/02/21.   Since consultation, she underwent biopsy of the upper central left breast on 12/14/21 which revealed no evidence of carcinoma, with fibrocystic changes, usual ductal hyperplasia and apocrine metaplasia. Biopsy of the lower inner left breast also collected revealed the same findings along with a small complex sclerosing lesion measuring 2 mm.  Right breast ultrasound on 12/21/21 showed an indeterminate oval lymph node with focal cortical thickening measuring 1.4 x 0.7 cm.   Biopsy of the right breast lymph node on 12/21/21 showed scant fibroadipose tissue without evidence of malignancy.    Bilateral breast MRI on 12/09/21 showed the biopsy-proven infiltrative malignancy involving the retroareolar and upper outer right breast measuring 6.7 x 4.9 x 2.6 cm. This was also seen to extent to the level of and involving the right nipple areola complex. MRI also showed a suspicious morphologically 2.1 cm lymph node in the right axilla, and  several scattered enhancing foci in the left breast, some of which were seen to be slightly larger/irregular in appearance, an indeterminate enhancing 0.6 cm mass in the lower inner left breast, and an indeterminate enhancing mass in the upper central left  breast measuring 0.6 cm.   Genetic testing performed on 01/12/22 showed no clinically significant variants detected by BRCAplus or +RNAinsight testing.  The patient presented with sudden pain in the left breast prompting an ultrasound of the left breast on 01/13/22 which showed ecchymosis overlying the inner left breast without sonographic abnormality in the area or in the retroareolar left breast. Given patient history, this was noted to likely represent a liquefied hematoma in the area of prior left breast biopsy that ruptured with subsequent ecchymosis.  She has been treated with chemotherapy consisting of TCHP from 12/16/21 through 03/31/22 under the care of Dr. Lindi Adie. This was followed by Herceptin and Perjeta maintenance beginning on 04/22/22. Chemo toxicities reported by the patient include intermittent diarrhea, chemotherapy induced anemia, mild nausea, severe fatigue, and chemo induced peripheral neuropathy.   Bilateral breast MRI on 02/26/22 showed a very good response of the large infiltrating right breast malignancy to neoadjuvant chemotherapy. A residual subcentimeter enhancing mass in the UIQ of the right breast was appreciated. However, the previously identified enhancement from the nipple into the UIQ which spanned almost 7 cm essentially resolved with treatment. MRI also showed an interval decrease in size of the previously identified solitary pathologic right axillary lymph node, as well as no residual enhancement in the LIQ of the right breast at the site of the biopsy proven small complex sclerosing lesion.  Bilateral breast MRI on 04/09/22 showed a significant positive chemotherapy response with only subtle residual non mass enhancement over the known areas of malignancy in the UOQ of the right breast extending to the retroareolar region. Remainder of the right breast as well as  the left breast were otherwise unremarkable. MRI also showed a decrease in size of the enlarged right  axillary lymph node.   She opted to proceed with bilateral lumpectomies with nodal biopsies on 05/05/22 under the care of Dr. Genia Hotter. Pathology from right breast lumpectomy revealed: no residual invasive carcinoma (or DCIS). Nodal status of 2/2 right axillary sentinel lymph nodes negative for carcinoma.    The patient then underwent left breast reduction on 05/24/22.   On review of systems, the patient reports ***. She denies *** and any other symptoms.    Allergies:  is allergic to codeine.  Meds: Current Outpatient Medications  Medication Sig Dispense Refill   Biotin (SUPER BIOTIN) 5 MG TABS by NG-Tube route daily before breakfast.     clindamycin (CLINDAGEL) 1 % gel Apply topically 2 (two) times daily. 30 g 0   clindamycin (CLINDAGEL) 1 % gel Apply topically.     diphenoxylate-atropine (LOMOTIL) 2.5-0.025 MG tablet Take 1 tablet by mouth 3 (three) times daily as needed for diarrhea or loose stools. 60 tablet 3   fluconazole (DIFLUCAN) 100 MG tablet      LORazepam (ATIVAN) 0.5 MG tablet Take 1 tablet (0.5 mg total) by mouth at bedtime as needed for anxiety. 30 tablet 0   magic mouthwash SOLN SWISH AND EXPECTORATE FIVE ML THREE TIMES DAILY AS NEEDED MOUTH FOR PAIN     omeprazole (PRILOSEC OTC) 20 MG tablet Take 1 tablet by mouth daily.     predniSONE (DELTASONE) 10 MG tablet prednisone 10 mg tablet  take 1 tab three times a day for 2 days, 1 tab twice a day for 5 days, 1 tab daily till finished     traZODone (DESYREL) 50 MG tablet Take 0.5-1 tablets (25-50 mg total) by mouth at bedtime as needed for sleep.     No current facility-administered medications for this encounter.    Physical Findings: The patient is in no acute distress. Patient is alert and oriented.  vitals were not taken for this visit.  No significant changes. Lungs are clear to auscultation bilaterally. Heart has regular rate and rhythm. No palpable cervical, supraclavicular, or axillary adenopathy. Abdomen soft,  non-tender, normal bowel sounds. *** Breast: no palpable mass, nipple discharge or bleeding. *** Breast: ***  Lab Findings: Lab Results  Component Value Date   WBC 5.2 04/22/2022   HGB 12.4 04/22/2022   HCT 35.3 (L) 04/22/2022   MCV 100.3 (H) 04/22/2022   PLT 173 04/22/2022    Radiographic Findings: No results found.  Impression: No residual invasive carcinoma s/p chemotherapy and bilateral lumpectomies   ***  Plan:  Patient is scheduled for CT simulation {date/later today}. ***  -----------------------------------  Blair Promise, PhD, MD  This document serves as a record of services personally performed by Gery Pray, MD. It was created on his behalf by Roney Mans, a trained medical scribe. The creation of this record is based on the scribe's personal observations and the provider's statements to them. This document has been checked and approved by the attending provider.

## 2022-06-02 ENCOUNTER — Other Ambulatory Visit: Payer: Self-pay | Admitting: *Deleted

## 2022-06-02 ENCOUNTER — Ambulatory Visit: Payer: BC Managed Care – PPO | Admitting: Radiation Oncology

## 2022-06-02 ENCOUNTER — Ambulatory Visit
Admission: RE | Admit: 2022-06-02 | Discharge: 2022-06-02 | Disposition: A | Discharge status: Home / Self Care | Payer: BC Managed Care – PPO | Source: Ambulatory Visit | Attending: Radiation Oncology | Admitting: Radiation Oncology

## 2022-06-02 ENCOUNTER — Encounter: Payer: Self-pay | Admitting: Radiation Oncology

## 2022-06-02 ENCOUNTER — Other Ambulatory Visit: Payer: Self-pay

## 2022-06-02 VITALS — BP 114/84 | HR 76 | Temp 96.9°F | Resp 18 | Ht 67.0 in | Wt 178.1 lb

## 2022-06-02 DIAGNOSIS — Z170 Estrogen receptor positive status [ER+]: Secondary | ICD-10-CM

## 2022-06-02 DIAGNOSIS — Z79899 Other long term (current) drug therapy: Secondary | ICD-10-CM | POA: Diagnosis not present

## 2022-06-02 DIAGNOSIS — C50411 Malignant neoplasm of upper-outer quadrant of right female breast: Secondary | ICD-10-CM | POA: Insufficient documentation

## 2022-06-02 DIAGNOSIS — Z7952 Long term (current) use of systemic steroids: Secondary | ICD-10-CM | POA: Insufficient documentation

## 2022-06-03 NOTE — Progress Notes (Signed)
Patient Care Team: Nicholas Lose, MD as PCP - General (Hematology and Oncology) Rockwell Germany, RN as Oncology Nurse Navigator Mauro Kaufmann, RN as Oncology Nurse Navigator Coralie Keens, MD as Consulting Physician (General Surgery) Nicholas Lose, MD as Consulting Physician (Hematology and Oncology) Gery Pray, MD as Consulting Physician (Radiation Oncology)  DIAGNOSIS:  Encounter Diagnosis  Name Primary?   Malignant neoplasm of upper-outer quadrant of right breast in female, estrogen receptor negative ([place])     SUMMARY OF ONCOLOGIC HISTORY: Oncology History  Malignant neoplasm of upper-outer quadrant of right female breast ([place])  11/19/2021 Initial Diagnosis   Palpable lump for 3 weeks, diagnostic mammogram: 2.2 cm x 2.2 cm irregular mass with a spiculated margin in the upper outer right breast. 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 HER2 positive, Ki-67 15%   12/02/2021 Cancer Staging   Staging form: Breast, AJCC 8th Edition - Clinical stage from 12/02/2021: Stage IB (cT2, cN0, cM0, G2, ER+, PR+, HER2+) - Signed by Nicholas Lose, MD on 12/02/2021 Stage prefix: Initial diagnosis Histologic grading system: 3 grade system   12/16/2021 - 05/12/2022 Chemotherapy   Patient is on Treatment Plan : BREAST  Docetaxel + Carboplatin + Trastuzumab + Pertuzumab  (TCHP) q21d      01/22/2022 Genetic Testing   Negative hereditary cancer genetic testing: no pathogenic variants detected in [facility] +RNAinsight Panel.  Report date is 01/22/2022.   The CancerNext gene panel offered by [employer] includes sequencing, rearrangement analysis, and RNA analysis for the following 36 genes:   APC, ATM, AXIN2, BARD1, BMPR1A, BRCA1, BRCA2, BRIP1, CDH1, CDK4, CDKN2A, CHEK2, DICER1, HOXB13, EPCAM, GREM1, MLH1, MSH2, MSH3, MSH6, MUTYH, NBN, NF1, NTHL1, PALB2, PMS2, POLD1, POLE, PTEN, RAD51C, RAD51D, RECQL, SMAD4, SMARCA4, STK11, and TP53.    05/05/2022 Surgery   Rt retroareolar  lumpectomy and Rt UOQ lumpectomy: No residual cancer, Lt Lumpectomy: Benign Complete Pathological Response   06/04/2022 -  Chemotherapy   Patient is on Treatment Plan : BREAST Trastuzumab  + Pertuzumab q21d x 13 cycles       CHIEF COMPLIANT: Follow-up right breat cancer herceptin   maintenance  INTERVAL HISTORY: Julie Atkinson is 51 y.o. with above-mentioned history of right breast cancer, currently on chemotherapy with TCHP. She presents to the clinic today for follow-up.  Overall she is doing good. She states that surgery was painful but fantastic. But as of today she feels a lot better. She was concern about her recurrence rate.     ALLERGIES:  is allergic to iodinated contrast media, latex, and codeine.  MEDICATIONS:  Current Outpatient Medications  Medication Sig Dispense Refill   acetaminophen (TYLENOL) 500 MG tablet Take 500 mg by mouth every 6 (six) hours as needed.     Biotin (SUPER BIOTIN) 5 MG TABS by NG-Tube route daily before breakfast.     clindamycin (CLINDAGEL) 1 % gel Apply topically 2 (two) times daily. 30 g 0   clindamycin (CLINDAGEL) 1 % gel Apply topically. (Patient not taking: Reported on 06/02/2022)     diphenoxylate-atropine (LOMOTIL) 2.5-0.025 MG tablet Take 1 tablet by mouth 3 (three) times daily as needed for diarrhea or loose stools. 60 tablet 3   Lactobacillus (RENEWAFLOR PO) Take by mouth.     LORazepam (ATIVAN) 0.5 MG tablet Take 1 tablet (0.5 mg total) by mouth at bedtime as needed for anxiety. 30 tablet 0   magic mouthwash SOLN SWISH AND EXPECTORATE FIVE ML THREE TIMES DAILY AS NEEDED MOUTH FOR PAIN  omeprazole (PRILOSEC OTC) 20 MG tablet Take 1 tablet by mouth daily. (Patient not taking: Reported on 06/02/2022)     oxyCODONE (OXY IR/ROXICODONE) 5 MG immediate release tablet Take by mouth.     predniSONE (DELTASONE) 10 MG tablet prednisone 10 mg tablet  take 1 tab three times a day for 2 days, 1 tab twice a day for 5 days, 1 tab daily till finished      traZODone (DESYREL) 50 MG tablet Take 0.5-1 tablets (25-50 mg total) by mouth at bedtime as needed for sleep. (Patient not taking: Reported on 06/02/2022)     No current facility-administered medications for this visit.    PHYSICAL EXAMINATION: ECOG PERFORMANCE STATUS: 1 - Symptomatic but completely ambulatory  Vitals:   06/04/22 0919  BP: 130/88  Pulse: 90  Resp: 18  Temp: (!) 97.2 F (36.2 C)  SpO2: 99%   Filed Weights   06/04/22 0919  Weight: 177 lb 3.2 oz (80.4 kg)      LABORATORY DATA:  I have reviewed the data as listed    Latest Ref Rng & Units 06/04/2022    9:13 AM 04/22/2022    9:42 AM 03/31/2022    9:33 AM  CMP  Glucose 70 - 99 mg/dL 81  80  108   BUN 6 - 20 mg/dL _0 Creatinine 0.44 - 1.00 mg/dL 0.71  0.66  0.67   Sodium 135 - 145 mmol/L 139  137  135   Potassium 3.5 - 5.1 mmol/L 4.0  3.6  4.3   Chloride 98 - 111 mmol/L 107  104  103   CO2 22 - 32 mmol/L _1 Calcium 8.9 - 10.3 mg/dL 8.8  8.9  9.4   Total Protein 6.5 - 8.1 g/dL 7.2  6.6  7.4   Total Bilirubin 0.3 - 1.2 mg/dL 0.4  0.5  0.6   Alkaline Phos 38 - 126 U/L 54  67  67   AST 15 - 41 U/L _2 ALT 0 - 44 U/L _3 Lab Results  Component Value Date   WBC 4.5 06/04/2022   HGB 12.6 06/04/2022   HCT 35.7 (L) 06/04/2022   MCV 97.0 06/04/2022   PLT 246 06/04/2022   NEUTROABS 2.3 06/04/2022    ASSESSMENT & PLAN:  Malignant neoplasm of upper-outer quadrant of right female breast ([place]) 11/19/2021:Palpable lump for 3 weeks, diagnostic mammogram: 2.2 cm x 2.2 cm irregular mass with a spiculated margin in the upper outer right breast. 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 HER2 positive, Ki-67 15% MRI Breast 12/09/21:  Right Breast retroareolar mass 6.7 cm (involves nipple areolar complex), Rt axilla 2.1 cm node, Left breast: Several scattered foci left breast indeterminate 0.6 cm mass LIQ left breast and another indeterminate 0.6 cm mass in upper central Left breast    Treatment Plan: 1. Neoadjuvant chemotherapy with TCHP foll by HP vs Kadcyla maintenance 2. 05/05/22: Bilateral lumpectomies: Path CR 3. Followed by adjuvant radiation therapy 4.  Adjuvant antiestrogen therapy ------------------------------------------------------------------------------------------------------------------------------------ Current Treatment: Herceptin maintenance Chemo toxicities: We will monitor closely for toxicities  Echocardiogram 03/17/2022: EF 58% She has CT simulation for radiation coming up.  RTC in 3 weeks for Herceptin maintenance and every 6 weeks for follow-up with me.    Orders Placed This Encounter  Procedures   ECHOCARDIOGRAM COMPLETE    Standing Status:  Future    Standing Expiration Date:   06/05/2023    Order Specific Question:   Where should this test be performed    Answer:   [facility]    Order Specific Question:   Perflutren DEFINITY (image enhancing agent) should be administered unless hypersensitivity or allergy exist    Answer:   Administer Perflutren    Order Specific Question:   Reason for exam-Echo    Answer:   Chemo  Z09    Order Specific Question:   Release to patient    Answer:   Immediate   The patient has a good understanding of the overall plan. she agrees with it. she will call with any problems that may develop before the next visit here. Total time spent: 30 mins including face to face time and time spent for planning, charting and co-ordination of care   Harriette Ohara, MD 06/04/22    I Gardiner Coins am scribing for Dr. Lindi Adie  I have reviewed the above documentation for accuracy and completeness, and I agree with the above.

## 2022-06-04 ENCOUNTER — Other Ambulatory Visit: Payer: Self-pay

## 2022-06-04 ENCOUNTER — Inpatient Hospital Stay: Payer: BC Managed Care – PPO

## 2022-06-04 ENCOUNTER — Inpatient Hospital Stay (HOSPITAL_BASED_OUTPATIENT_CLINIC_OR_DEPARTMENT_OTHER): Payer: BC Managed Care – PPO | Admitting: Hematology and Oncology

## 2022-06-04 VITALS — BP 136/90 | HR 78 | Temp 99.4°F | Resp 18

## 2022-06-04 DIAGNOSIS — C50411 Malignant neoplasm of upper-outer quadrant of right female breast: Secondary | ICD-10-CM | POA: Diagnosis not present

## 2022-06-04 DIAGNOSIS — Z171 Estrogen receptor negative status [ER-]: Secondary | ICD-10-CM | POA: Diagnosis not present

## 2022-06-04 DIAGNOSIS — Z170 Estrogen receptor positive status [ER+]: Secondary | ICD-10-CM

## 2022-06-04 DIAGNOSIS — Z95828 Presence of other vascular implants and grafts: Secondary | ICD-10-CM

## 2022-06-04 DIAGNOSIS — Z87891 Personal history of nicotine dependence: Secondary | ICD-10-CM | POA: Diagnosis not present

## 2022-06-04 DIAGNOSIS — Z5112 Encounter for antineoplastic immunotherapy: Secondary | ICD-10-CM | POA: Diagnosis not present

## 2022-06-04 LAB — CMP (CANCER CENTER ONLY)
ALT: 15 U/L (ref 0–44)
AST: 18 U/L (ref 15–41)
Albumin: 4.2 g/dL (ref 3.5–5.0)
Alkaline Phosphatase: 54 U/L (ref 38–126)
Anion gap: 7 (ref 5–15)
BUN: 10 mg/dL (ref 6–20)
CO2: 25 mmol/L (ref 22–32)
Calcium: 8.8 mg/dL — ABNORMAL LOW (ref 8.9–10.3)
Chloride: 107 mmol/L (ref 98–111)
Creatinine: 0.71 mg/dL (ref 0.44–1.00)
GFR, Estimated: >60 mL/min (ref >60)
Glucose, Bld: 81 mg/dL (ref 70–99)
Potassium: 4 mmol/L (ref 3.5–5.1)
Sodium: 139 mmol/L (ref 135–145)
Total Bilirubin: 0.4 mg/dL (ref 0.3–1.2)
Total Protein: 7.2 g/dL (ref 6.5–8.1)

## 2022-06-04 LAB — CBC WITH DIFFERENTIAL (CANCER CENTER ONLY)
Abs Immature Granulocytes: 0.01 10*3/uL (ref 0.00–0.07)
Basophils Absolute: 0 10*3/uL (ref 0.0–0.1)
Basophils Relative: 1 %
Eosinophils Absolute: 0.1 10*3/uL (ref 0.0–0.5)
Eosinophils Relative: 3 %
HCT: 35.7 % — ABNORMAL LOW (ref 36.0–46.0)
Hemoglobin: 12.6 g/dL (ref 12.0–15.0)
Immature Granulocytes: 0 %
Lymphocytes Relative: 38 %
Lymphs Abs: 1.7 10*3/uL (ref 0.7–4.0)
MCH: 34.2 pg — ABNORMAL HIGH (ref 26.0–34.0)
MCHC: 35.3 g/dL (ref 30.0–36.0)
MCV: 97 fL (ref 80.0–100.0)
Monocytes Absolute: 0.4 10*3/uL (ref 0.1–1.0)
Monocytes Relative: 8 %
Neutro Abs: 2.3 10*3/uL (ref 1.7–7.7)
Neutrophils Relative %: 50 %
Platelet Count: 246 10*3/uL (ref 150–400)
RBC: 3.68 MIL/uL — ABNORMAL LOW (ref 3.87–5.11)
RDW: 11.7 % (ref 11.5–15.5)
WBC Count: 4.5 10*3/uL (ref 4.0–10.5)
nRBC: 0 % (ref 0.0–0.2)

## 2022-06-04 MED ORDER — SODIUM CHLORIDE 0.9% FLUSH
10.0000 mL | Freq: Once | INTRAVENOUS | Status: AC
  Administered 2022-06-04: 10 mL

## 2022-06-04 MED ORDER — TRASTUZUMAB-DKST CHEMO 150 MG IV SOLR
6.0000 mg/kg | Freq: Once | INTRAVENOUS | Status: AC
  Administered 2022-06-04: 483 mg via INTRAVENOUS
  Filled 2022-06-04: qty 23

## 2022-06-04 MED ORDER — LORATADINE 10 MG PO TABS
10.0000 mg | ORAL_TABLET | Freq: Once | ORAL | Status: AC
  Administered 2022-06-04: 10 mg via ORAL
  Filled 2022-06-04: qty 1

## 2022-06-04 MED ORDER — SODIUM CHLORIDE 0.9 % IV SOLN: Freq: Once | INTRAVENOUS | Status: AC

## 2022-06-04 MED ORDER — SODIUM CHLORIDE 0.9% FLUSH
10.0000 mL | INTRAVENOUS | Status: DC | PRN
  Administered 2022-06-04: 10 mL

## 2022-06-04 MED ORDER — HEPARIN SOD (PORK) LOCK FLUSH 100 UNIT/ML IV SOLN
500.0000 [IU] | Freq: Once | INTRAVENOUS | Status: AC | PRN
  Administered 2022-06-04: 500 [IU]

## 2022-06-04 MED ORDER — ACETAMINOPHEN 325 MG PO TABS
650.0000 mg | ORAL_TABLET | Freq: Once | ORAL | Status: AC
  Administered 2022-06-04: 650 mg via ORAL
  Filled 2022-06-04: qty 2

## 2022-06-04 NOTE — Assessment & Plan Note (Addendum)
11/19/2021:Palpable lump for 3 weeks, diagnostic mammogram: 2.2 cm x 2.2 cm irregular mass with a spiculated margin in the upper outer right breast. 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 HER2 positive, Ki-67 15% MRI Breast 12/09/21: Right Breast retroareolar mass 6.7 cm (involves nipple areolar complex), Rt axilla 2.1 cm node, Left breast: Several scattered foci left breast indeterminate 0.6 cm mass LIQ left breast and another indeterminate 0.6 cm mass in upper central Left breast  Treatment Plan: 1. Neoadjuvant chemotherapy withTCHP foll by HP vs Kadcyla maintenance 2. 05/05/22: Bilateral lumpectomies: Path CR 3. Followed by adjuvant radiation therapy 4.  Adjuvant antiestrogen therapy ------------------------------------------------------------------------------------------------------------------------------------ Current Treatment:Herceptin maintenance Chemo toxicities: We will monitor closely for toxicities  Echocardiogram 03/17/2022: EF 58% She has CT simulation for radiation coming up.  RTCin 3 weeks for Herceptin maintenance and every 6 weeks for follow-up with me.

## 2022-06-04 NOTE — Progress Notes (Signed)
Per Dr. Lindi Adie ok to change pre medication of Benadryl to Claritin for Trastuzumab and Pertuzumab for today and future treatments.  Pharmacy notified.

## 2022-06-04 NOTE — Patient Instructions (Signed)
[facility]  Discharge Instructions: Thank you for choosing [facility] to provide your oncology and hematology care.   If you have a lab appointment with the [facility], please go directly to the [facility] and check in at the registration area.   Wear comfortable clothing and clothing appropriate for easy access to any Portacath or PICC line.   We strive to give you quality time with your provider. You may need to reschedule your appointment if you arrive late (15 or more minutes).  Arriving late affects you and other patients whose appointments are after yours.  Also, if you miss three or more appointments without notifying the office, you may be dismissed from the clinic at the provider's discretion.      For prescription refill requests, have your pharmacy contact our office and allow 72 hours for refills to be completed.    Today you received the following chemotherapy and/or immunotherapy agents: [place]ri      To help prevent nausea and vomiting after your treatment, we encourage you to take your nausea medication as directed.  BELOW ARE SYMPTOMS THAT SHOULD BE REPORTED IMMEDIATELY: *FEVER GREATER THAN 100.4 F (38 C) OR HIGHER *CHILLS OR SWEATING *NAUSEA AND VOMITING THAT IS NOT CONTROLLED WITH YOUR NAUSEA MEDICATION *UNUSUAL SHORTNESS OF BREATH *UNUSUAL BRUISING OR BLEEDING *URINARY PROBLEMS (pain or burning when urinating, or frequent urination) *BOWEL PROBLEMS (unusual diarrhea, constipation, pain near the anus) TENDERNESS IN MOUTH AND THROAT WITH OR WITHOUT PRESENCE OF ULCERS (sore throat, sores in mouth, or a toothache) UNUSUAL RASH, SWELLING OR PAIN  UNUSUAL VAGINAL DISCHARGE OR ITCHING   Items with * indicate a potential emergency and should be followed up as soon as possible or go to the Emergency Department if any problems should occur.  Please show the CHEMOTHERAPY ALERT CARD or IMMUNOTHERAPY ALERT CARD at check-in to the  Emergency Department and triage nurse.  Should you have questions after your visit or need to cancel or reschedule your appointment, please contact [facility]  Dept: [phone]  and follow the prompts.  Office hours are 8:00 a.m. to 4:30 p.m. Monday - Friday. Please note that voicemails left after 4:00 p.m. may not be returned until the following business day.  We are closed weekends and major holidays. You have access to a nurse at all times for urgent questions. Please call the main number to the clinic Dept: [phone] and follow the prompts.   For any non-urgent questions, you may also contact your provider using MyChart. We now offer e-Visits for anyone 40 and older to request care online for non-urgent symptoms. For details visit [email or web address].   Also download the MyChart app! Go to the app store, search "MyChart", open the app, select [facility], and log in with your MyChart username and password.  Masks are optional in the cancer centers. If you would like for your care team to wear a mask while they are taking care of you, please let them know. For doctor visits, patients may have with them one support person who is at least 52 years old. At this time, visitors are not allowed in the infusion area.

## 2022-06-08 HISTORY — PX: OTHER SURGICAL HISTORY: SHX169

## 2022-06-10 ENCOUNTER — Encounter: Payer: Self-pay | Admitting: *Deleted

## 2022-06-11 ENCOUNTER — Other Ambulatory Visit (HOSPITAL_COMMUNITY): Payer: BC Managed Care – PPO

## 2022-06-11 ENCOUNTER — Ambulatory Visit (INDEPENDENT_AMBULATORY_CARE_PROVIDER_SITE_OTHER): Payer: BC Managed Care – PPO

## 2022-06-11 DIAGNOSIS — Z0189 Encounter for other specified special examinations: Secondary | ICD-10-CM | POA: Diagnosis not present

## 2022-06-11 DIAGNOSIS — C50411 Malignant neoplasm of upper-outer quadrant of right female breast: Secondary | ICD-10-CM

## 2022-06-11 DIAGNOSIS — Z171 Estrogen receptor negative status [ER-]: Secondary | ICD-10-CM

## 2022-06-11 LAB — ECHOCARDIOGRAM COMPLETE
AR max vel: 2.67 cm2
AV Area VTI: 2.89 cm2
AV Area mean vel: 2.69 cm2
AV Mean grad: 4 mmHg
AV Peak grad: 7.6 mmHg
Ao pk vel: 1.38 m/s
Area-P 1/2: 2.64 cm2
S' Lateral: 2.57 cm

## 2022-06-11 NOTE — Progress Notes (Unsigned)
During exam patient expressed severe anxiety about effects of testing on bandaged area. Specifically infection of incised area. Also, concerns over postioning due to recent breast surgery. Asked patient would she like to discuss testing with surgeon before continuing echo.Patient stated it was okay to continue. I assured patient that I would not touch taped area with transducer. I was able to obtain pictures with limitations.

## 2022-06-15 ENCOUNTER — Encounter: Payer: Self-pay | Admitting: Hematology and Oncology

## 2022-06-15 ENCOUNTER — Encounter: Payer: Self-pay | Admitting: *Deleted

## 2022-06-18 ENCOUNTER — Telehealth: Payer: Self-pay | Admitting: Oncology

## 2022-06-18 NOTE — Telephone Encounter (Signed)
Julie Atkinson called and said she needs to go out of town because her father is sick.  She is wondering if she can reschedule her CT SIM for 06/30/22.  She is also wondering if her radiation appointments can be at 9:30 daily.  Advised her we will call her back with the new appointments.

## 2022-06-22 ENCOUNTER — Telehealth: Payer: Self-pay | Admitting: Hematology and Oncology

## 2022-06-22 NOTE — Telephone Encounter (Signed)
Rescheduled appointment per 8/14 staff message. Left voicemail.

## 2022-06-23 ENCOUNTER — Ambulatory Visit: Payer: BC Managed Care – PPO | Admitting: Radiation Oncology

## 2022-06-25 ENCOUNTER — Inpatient Hospital Stay: Payer: BC Managed Care – PPO

## 2022-06-29 DIAGNOSIS — Z170 Estrogen receptor positive status [ER+]: Secondary | ICD-10-CM | POA: Diagnosis not present

## 2022-06-29 DIAGNOSIS — C50411 Malignant neoplasm of upper-outer quadrant of right female breast: Secondary | ICD-10-CM | POA: Diagnosis not present

## 2022-06-30 ENCOUNTER — Other Ambulatory Visit: Payer: Self-pay

## 2022-06-30 ENCOUNTER — Ambulatory Visit
Admission: RE | Admit: 2022-06-30 | Discharge: 2022-06-30 | Disposition: A | Discharge status: Home / Self Care | Payer: BC Managed Care – PPO | Source: Ambulatory Visit | Attending: Radiation Oncology | Admitting: Radiation Oncology

## 2022-06-30 DIAGNOSIS — C50411 Malignant neoplasm of upper-outer quadrant of right female breast: Secondary | ICD-10-CM | POA: Diagnosis not present

## 2022-06-30 DIAGNOSIS — Z170 Estrogen receptor positive status [ER+]: Secondary | ICD-10-CM | POA: Diagnosis not present

## 2022-07-01 ENCOUNTER — Telehealth: Payer: Self-pay | Admitting: Hematology and Oncology

## 2022-07-01 NOTE — Telephone Encounter (Signed)
Rescheduled appointment per 8/15 staff message. Left voicemail.

## 2022-07-02 ENCOUNTER — Inpatient Hospital Stay: Payer: BC Managed Care – PPO | Attending: Hematology and Oncology

## 2022-07-02 ENCOUNTER — Other Ambulatory Visit: Payer: Self-pay

## 2022-07-02 VITALS — BP 132/86 | HR 88 | Temp 97.9°F | Resp 18

## 2022-07-02 DIAGNOSIS — Z5112 Encounter for antineoplastic immunotherapy: Secondary | ICD-10-CM | POA: Diagnosis not present

## 2022-07-02 DIAGNOSIS — Z171 Estrogen receptor negative status [ER-]: Secondary | ICD-10-CM | POA: Insufficient documentation

## 2022-07-02 DIAGNOSIS — Z923 Personal history of irradiation: Secondary | ICD-10-CM | POA: Diagnosis not present

## 2022-07-02 DIAGNOSIS — C50411 Malignant neoplasm of upper-outer quadrant of right female breast: Secondary | ICD-10-CM | POA: Diagnosis not present

## 2022-07-02 DIAGNOSIS — Z170 Estrogen receptor positive status [ER+]: Secondary | ICD-10-CM

## 2022-07-02 MED ORDER — SODIUM CHLORIDE 0.9 % IV SOLN: Freq: Once | INTRAVENOUS | Status: AC

## 2022-07-02 MED ORDER — TRASTUZUMAB-DKST CHEMO 150 MG IV SOLR
6.0000 mg/kg | Freq: Once | INTRAVENOUS | Status: AC
  Administered 2022-07-02: 483 mg via INTRAVENOUS
  Filled 2022-07-02: qty 23

## 2022-07-02 MED ORDER — ACETAMINOPHEN 325 MG PO TABS
650.0000 mg | ORAL_TABLET | Freq: Once | ORAL | Status: AC
  Administered 2022-07-02: 650 mg via ORAL
  Filled 2022-07-02: qty 2

## 2022-07-02 MED ORDER — SODIUM CHLORIDE 0.9% FLUSH
10.0000 mL | INTRAVENOUS | Status: DC | PRN
  Administered 2022-07-02: 10 mL

## 2022-07-02 MED ORDER — LORATADINE 10 MG PO TABS
10.0000 mg | ORAL_TABLET | Freq: Once | ORAL | Status: AC
  Administered 2022-07-02: 10 mg via ORAL
  Filled 2022-07-02: qty 1

## 2022-07-02 MED ORDER — HEPARIN SOD (PORK) LOCK FLUSH 100 UNIT/ML IV SOLN
500.0000 [IU] | Freq: Once | INTRAVENOUS | Status: AC | PRN
  Administered 2022-07-02: 500 [IU]

## 2022-07-02 NOTE — Patient Instructions (Signed)
[facility] ONCOLOGY  Discharge Instructions: Thank you for choosing [facility] to provide your oncology and hematology care.   If you have a lab appointment with the [facility], please go directly to the [facility] and check in at the registration area.   Wear comfortable clothing and clothing appropriate for easy access to any Portacath or PICC line.   We strive to give you quality time with your provider. You may need to reschedule your appointment if you arrive late (15 or more minutes).  Arriving late affects you and other patients whose appointments are after yours.  Also, if you miss three or more appointments without notifying the office, you may be dismissed from the clinic at the provider's discretion.      For prescription refill requests, have your pharmacy contact our office and allow 72 hours for refills to be completed.    Today you received the following chemotherapy and/or immunotherapy agents: Ogivri      To help prevent nausea and vomiting after your treatment, we encourage you to take your nausea medication as directed.  BELOW ARE SYMPTOMS THAT SHOULD BE REPORTED IMMEDIATELY: *FEVER GREATER THAN 100.4 F (38 C) OR HIGHER *CHILLS OR SWEATING *NAUSEA AND VOMITING THAT IS NOT CONTROLLED WITH YOUR NAUSEA MEDICATION *UNUSUAL SHORTNESS OF BREATH *UNUSUAL BRUISING OR BLEEDING *URINARY PROBLEMS (pain or burning when urinating, or frequent urination) *BOWEL PROBLEMS (unusual diarrhea, constipation, pain near the anus) TENDERNESS IN MOUTH AND THROAT WITH OR WITHOUT PRESENCE OF ULCERS (sore throat, sores in mouth, or a toothache) UNUSUAL RASH, SWELLING OR PAIN  UNUSUAL VAGINAL DISCHARGE OR ITCHING   Items with * indicate a potential emergency and should be followed up as soon as possible or go to the Emergency Department if any problems should occur.  Please show the CHEMOTHERAPY ALERT CARD or IMMUNOTHERAPY ALERT CARD at check-in to the  Emergency Department and triage nurse.  Should you have questions after your visit or need to cancel or reschedule your appointment, please contact [facility]  Dept: [phone]  and follow the prompts.  Office hours are 8:00 a.m. to 4:30 p.m. Monday - Friday. Please note that voicemails left after 4:00 p.m. may not be returned until the following business day.  We are closed weekends and major holidays. You have access to a nurse at all times for urgent questions. Please call the main number to the clinic Dept: [phone] and follow the prompts.   For any non-urgent questions, you may also contact your provider using MyChart. We now offer e-Visits for anyone 35 and older to request care online for non-urgent symptoms. For details visit [email or web address].   Also download the MyChart app! Go to the app store, search "MyChart", open the app, select [facility], and log in with your MyChart username and password.  Masks are optional in the cancer centers. If you would like for your care team to wear a mask while they are taking care of you, please let them know. For doctor visits, patients may have with them one support person who is at least 52 years old. At this time, visitors are not allowed in the infusion area.

## 2022-07-07 ENCOUNTER — Ambulatory Visit: Payer: BC Managed Care – PPO | Admitting: Radiation Oncology

## 2022-07-07 DIAGNOSIS — C50411 Malignant neoplasm of upper-outer quadrant of right female breast: Secondary | ICD-10-CM | POA: Diagnosis not present

## 2022-07-07 DIAGNOSIS — Z170 Estrogen receptor positive status [ER+]: Secondary | ICD-10-CM | POA: Diagnosis not present

## 2022-07-08 ENCOUNTER — Ambulatory Visit: Payer: BC Managed Care – PPO

## 2022-07-08 ENCOUNTER — Ambulatory Visit: Payer: BC Managed Care – PPO | Admitting: Radiation Oncology

## 2022-07-09 ENCOUNTER — Ambulatory Visit: Payer: BC Managed Care – PPO

## 2022-07-13 ENCOUNTER — Ambulatory Visit: Payer: BC Managed Care – PPO

## 2022-07-14 ENCOUNTER — Ambulatory Visit: Payer: BC Managed Care – PPO

## 2022-07-14 ENCOUNTER — Other Ambulatory Visit: Payer: Self-pay | Admitting: Hematology and Oncology

## 2022-07-14 DIAGNOSIS — C50411 Malignant neoplasm of upper-outer quadrant of right female breast: Secondary | ICD-10-CM

## 2022-07-15 ENCOUNTER — Ambulatory Visit: Payer: BC Managed Care – PPO

## 2022-07-15 ENCOUNTER — Other Ambulatory Visit: Payer: Self-pay

## 2022-07-15 ENCOUNTER — Ambulatory Visit
Admission: RE | Admit: 2022-07-15 | Discharge: 2022-07-15 | Disposition: A | Discharge status: Home / Self Care | Payer: BC Managed Care – PPO | Source: Ambulatory Visit | Attending: Radiation Oncology | Admitting: Radiation Oncology

## 2022-07-15 DIAGNOSIS — N6311 Unspecified lump in the right breast, upper outer quadrant: Secondary | ICD-10-CM | POA: Diagnosis not present

## 2022-07-15 DIAGNOSIS — Z170 Estrogen receptor positive status [ER+]: Secondary | ICD-10-CM | POA: Insufficient documentation

## 2022-07-15 DIAGNOSIS — C50411 Malignant neoplasm of upper-outer quadrant of right female breast: Secondary | ICD-10-CM | POA: Diagnosis not present

## 2022-07-15 DIAGNOSIS — Z510 Encounter for antineoplastic radiation therapy: Secondary | ICD-10-CM | POA: Diagnosis not present

## 2022-07-15 LAB — RAD ONC ARIA SESSION SUMMARY
Course Elapsed Days: 0
Plan Fractions Treated to Date: 1
Plan Prescribed Dose Per Fraction: 1.8 Gy
Plan Total Fractions Prescribed: 28
Plan Total Prescribed Dose: 50.4 Gy
Reference Point Dosage Given to Date: 1.8 Gy
Reference Point Session Dosage Given: 1.8 Gy
Session Number: 1

## 2022-07-16 ENCOUNTER — Inpatient Hospital Stay: Payer: BC Managed Care – PPO

## 2022-07-16 ENCOUNTER — Ambulatory Visit
Admission: RE | Admit: 2022-07-16 | Discharge: 2022-07-16 | Disposition: A | Discharge status: Home / Self Care | Payer: BC Managed Care – PPO | Source: Ambulatory Visit | Attending: Radiation Oncology | Admitting: Radiation Oncology

## 2022-07-16 ENCOUNTER — Ambulatory Visit: Payer: BC Managed Care – PPO

## 2022-07-16 ENCOUNTER — Inpatient Hospital Stay: Payer: BC Managed Care – PPO | Admitting: Hematology and Oncology

## 2022-07-16 ENCOUNTER — Other Ambulatory Visit: Payer: Self-pay

## 2022-07-16 DIAGNOSIS — C50411 Malignant neoplasm of upper-outer quadrant of right female breast: Secondary | ICD-10-CM | POA: Diagnosis not present

## 2022-07-16 DIAGNOSIS — Z170 Estrogen receptor positive status [ER+]: Secondary | ICD-10-CM | POA: Diagnosis not present

## 2022-07-16 DIAGNOSIS — N6311 Unspecified lump in the right breast, upper outer quadrant: Secondary | ICD-10-CM | POA: Diagnosis not present

## 2022-07-16 DIAGNOSIS — Z510 Encounter for antineoplastic radiation therapy: Secondary | ICD-10-CM | POA: Diagnosis not present

## 2022-07-16 LAB — RAD ONC ARIA SESSION SUMMARY
Course Elapsed Days: 1
Plan Fractions Treated to Date: 2
Plan Prescribed Dose Per Fraction: 1.8 Gy
Plan Total Fractions Prescribed: 28
Plan Total Prescribed Dose: 50.4 Gy
Reference Point Dosage Given to Date: 3.6 Gy
Reference Point Session Dosage Given: 1.8 Gy
Session Number: 2

## 2022-07-16 MED ORDER — RADIAPLEXRX EX GEL: Freq: Once | CUTANEOUS | Status: AC

## 2022-07-16 MED ORDER — ALRA NON-METALLIC DEODORANT (RAD-ONC)
1.0000 | Freq: Once | TOPICAL | Status: AC
  Administered 2022-07-16: 1 via TOPICAL

## 2022-07-16 NOTE — Progress Notes (Signed)
Pt here for patient teaching.  Pt given Radiation and You booklet, Alra deodorant, and Radiaplex gel.  Reviewed areas of pertinence such as fatigue, skin changes, breast tenderness, and breast swelling . Pt able to give teach back of to pat skin and use unscented/gentle soap,apply Radiaplex bid, avoid applying anything to skin within 4 hours of treatment, and avoid wearing an under wire bra. Pt demonstrated understanding and verbalizes understanding of information given and will contact nursing with any questions or concerns.     [email or web address]

## 2022-07-19 ENCOUNTER — Ambulatory Visit
Admission: RE | Admit: 2022-07-19 | Discharge: 2022-07-19 | Disposition: A | Discharge status: Home / Self Care | Payer: BC Managed Care – PPO | Source: Ambulatory Visit | Attending: Radiation Oncology | Admitting: Radiation Oncology

## 2022-07-19 ENCOUNTER — Encounter: Payer: Self-pay | Admitting: Hematology and Oncology

## 2022-07-19 ENCOUNTER — Ambulatory Visit: Payer: BC Managed Care – PPO

## 2022-07-19 ENCOUNTER — Other Ambulatory Visit: Payer: Self-pay

## 2022-07-19 ENCOUNTER — Other Ambulatory Visit: Payer: Self-pay | Admitting: *Deleted

## 2022-07-19 DIAGNOSIS — Z510 Encounter for antineoplastic radiation therapy: Secondary | ICD-10-CM | POA: Diagnosis not present

## 2022-07-19 DIAGNOSIS — C50411 Malignant neoplasm of upper-outer quadrant of right female breast: Secondary | ICD-10-CM | POA: Diagnosis not present

## 2022-07-19 DIAGNOSIS — N6311 Unspecified lump in the right breast, upper outer quadrant: Secondary | ICD-10-CM | POA: Diagnosis not present

## 2022-07-19 DIAGNOSIS — Z170 Estrogen receptor positive status [ER+]: Secondary | ICD-10-CM | POA: Diagnosis not present

## 2022-07-19 LAB — RAD ONC ARIA SESSION SUMMARY
Course Elapsed Days: 4
Plan Fractions Treated to Date: 3
Plan Prescribed Dose Per Fraction: 1.8 Gy
Plan Total Fractions Prescribed: 28
Plan Total Prescribed Dose: 50.4 Gy
Reference Point Dosage Given to Date: 5.4 Gy
Reference Point Session Dosage Given: 1.8 Gy
Session Number: 3

## 2022-07-19 MED ORDER — LIDOCAINE-PRILOCAINE 2.5-2.5 % EX CREA: 1.0000 | TOPICAL_CREAM | CUTANEOUS | 1 refills | Status: DC | PRN

## 2022-07-19 NOTE — Progress Notes (Signed)
Patient Care Team: Nicholas Lose, MD as PCP - General (Hematology and Oncology) Rockwell Germany, RN as Oncology Nurse Navigator Mauro Kaufmann, RN as Oncology Nurse Navigator Coralie Keens, MD as Consulting Physician (General Surgery) Nicholas Lose, MD as Consulting Physician (Hematology and Oncology) Gery Pray, MD as Consulting Physician (Radiation Oncology)  DIAGNOSIS:  Encounter Diagnosis  Name Primary?   Malignant neoplasm of upper-outer quadrant of right breast in female, estrogen receptor positive ([place])     SUMMARY OF ONCOLOGIC HISTORY: Oncology History  Malignant neoplasm of upper-outer quadrant of right female breast ([place])  11/19/2021 Initial Diagnosis   Palpable lump for 3 weeks, diagnostic mammogram: 2.2 cm x 2.2 cm irregular mass with a spiculated margin in the upper outer right breast. 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 HER2 positive, Ki-67 15%   12/02/2021 Cancer Staging   Staging form: Breast, AJCC 8th Edition - Clinical stage from 12/02/2021: Stage IB (cT2, cN0, cM0, G2, ER+, PR+, HER2+) - Signed by Nicholas Lose, MD on 12/02/2021 Stage prefix: Initial diagnosis Histologic grading system: 3 grade system   12/16/2021 - 05/12/2022 Chemotherapy   Patient is on Treatment Plan : BREAST  Docetaxel + Carboplatin + Trastuzumab + Pertuzumab  (TCHP) q21d      01/22/2022 Genetic Testing   Negative hereditary cancer genetic testing: no pathogenic variants detected in [facility] +RNAinsight Panel.  Report date is 01/22/2022.   The CancerNext gene panel offered by [employer] includes sequencing, rearrangement analysis, and RNA analysis for the following 36 genes:   APC, ATM, AXIN2, BARD1, BMPR1A, BRCA1, BRCA2, BRIP1, CDH1, CDK4, CDKN2A, CHEK2, DICER1, HOXB13, EPCAM, GREM1, MLH1, MSH2, MSH3, MSH6, MUTYH, NBN, NF1, NTHL1, PALB2, PMS2, POLD1, POLE, PTEN, RAD51C, RAD51D, RECQL, SMAD4, SMARCA4, STK11, and TP53.    04/22/2022 -  Chemotherapy   Patient is on  Treatment Plan : BREAST MAINTENANCE Trastuzumab IV (6) or SQ (600) D1 q21d X 11 Cycles     05/05/2022 Surgery   Rt retroareolar lumpectomy and Rt UOQ lumpectomy: No residual cancer, Lt Lumpectomy: Benign Complete Pathological Response   06/04/2022 - 07/02/2022 Chemotherapy   Patient is on Treatment Plan : BREAST Trastuzumab  + Pertuzumab q21d x 13 cycles       CHIEF COMPLIANT: Follow-up right breat cancer herceptin maintenance    INTERVAL HISTORY: Julie Atkinson is a 51 y.o. with above-mentioned history of right breast cancer, currently on chemotherapy with TCHP. She presents to the clinic today for follow-up.  She states that radiation is going ok She reports some issues about her incisions is starting to swell up. It feels like a boil and she is in pain. She also has some radiation dermatitis. She also cut her left hand by accident but is starting to heal. She complains about neuropathy the left hand and her legs feeling tight and swollen and they hurt. Mostly muscle aches and pain. She is tolerating the herceptin. Bowels still the same.   ALLERGIES:  is allergic to iodinated contrast media, latex, and codeine.  MEDICATIONS:  Current Outpatient Medications  Medication Sig Dispense Refill   clonazePAM (KLONOPIN) 0.5 MG disintegrating tablet Take by mouth.     acetaminophen (TYLENOL) 500 MG tablet Take 500 mg by mouth every 6 (six) hours as needed.     Biotin (SUPER BIOTIN) 5 MG TABS by NG-Tube route daily before breakfast.     clindamycin (CLINDAGEL) 1 % gel Apply topically 2 (two) times daily. 30 g 0   clindamycin (CLINDAGEL) 1 %  gel Apply topically. (Patient not taking: Reported on 06/02/2022)     diphenoxylate-atropine (LOMOTIL) 2.5-0.025 MG tablet Take 1 tablet by mouth 3 (three) times daily as needed for diarrhea or loose stools. 60 tablet 3   Lactobacillus (RENEWAFLOR PO) Take by mouth.     lidocaine-prilocaine (EMLA) cream Apply 1 Application topically as needed. 30 g 1    LORazepam (ATIVAN) 0.5 MG tablet Take 1 tablet (0.5 mg total) by mouth at bedtime as needed for anxiety. 30 tablet 0   magic mouthwash SOLN SWISH AND EXPECTORATE FIVE ML THREE TIMES DAILY AS NEEDED MOUTH FOR PAIN     omeprazole (PRILOSEC OTC) 20 MG tablet Take 1 tablet by mouth daily. (Patient not taking: Reported on 06/02/2022)     oxyCODONE (OXY IR/ROXICODONE) 5 MG immediate release tablet Take by mouth.     predniSONE (DELTASONE) 10 MG tablet prednisone 10 mg tablet  take 1 tab three times a day for 2 days, 1 tab twice a day for 5 days, 1 tab daily till finished     traZODone (DESYREL) 50 MG tablet Take 0.5-1 tablets (25-50 mg total) by mouth at bedtime as needed for sleep. (Patient not taking: Reported on 06/02/2022)     No current facility-administered medications for this visit.    PHYSICAL EXAMINATION: ECOG PERFORMANCE STATUS: 1 - Symptomatic but completely ambulatory  Vitals:   07/22/22 0834  BP: 129/81  Pulse: 71  Resp: 17  Temp: (!) 97.2 F (36.2 C)  SpO2: 100%   Filed Weights   07/22/22 0834  Weight: 176 lb 11.2 oz (80.2 kg)      LABORATORY DATA:  I have reviewed the data as listed    Latest Ref Rng & Units 06/04/2022    9:13 AM 04/22/2022    9:42 AM 03/31/2022    9:33 AM  CMP  Glucose 70 - 99 mg/dL 81  80  108   BUN 6 - 20 mg/dL _0 Creatinine 0.44 - 1.00 mg/dL 0.71  0.66  0.67   Sodium 135 - 145 mmol/L 139  137  135   Potassium 3.5 - 5.1 mmol/L 4.0  3.6  4.3   Chloride 98 - 111 mmol/L 107  104  103   CO2 22 - 32 mmol/L _1 Calcium 8.9 - 10.3 mg/dL 8.8  8.9  9.4   Total Protein 6.5 - 8.1 g/dL 7.2  6.6  7.4   Total Bilirubin 0.3 - 1.2 mg/dL 0.4  0.5  0.6   Alkaline Phos 38 - 126 U/L 54  67  67   AST 15 - 41 U/L _2 ALT 0 - 44 U/L _3 Lab Results  Component Value Date   WBC 4.4 07/22/2022   HGB 14.1 07/22/2022   HCT 40.4 07/22/2022   MCV 96.4 07/22/2022   PLT 206 07/22/2022   NEUTROABS 2.0 07/22/2022    ASSESSMENT  & PLAN:  Malignant neoplasm of upper-outer quadrant of right female breast ([place]) 11/19/2021:Palpable lump for 3 weeks, diagnostic mammogram: 2.2 cm x 2.2 cm irregular mass with a spiculated margin in the upper outer right breast. 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 HER2 positive, Ki-67 15% MRI Breast 12/09/21:  Right Breast retroareolar mass 6.7 cm (involves nipple areolar complex), Rt axilla 2.1 cm node, Left breast: Several scattered foci left breast indeterminate 0.6 cm mass LIQ left  breast and another indeterminate 0.6 cm mass in upper central Left breast   Treatment Plan: 1. Neoadjuvant chemotherapy with TCHP foll by HP vs Kadcyla maintenance 2. 05/05/22: Bilateral lumpectomies: Path CR 3. Followed by adjuvant radiation therapy 07/16/2022-08/23/2022 4.  Adjuvant antiestrogen therapy ------------------------------------------------------------------------------------------------------------------------------------ Current Treatment: Herceptin maintenance Chemo toxicities:  Monitoring closely for toxicities  Echocardiogram 03/17/2022: EF 58%  Radiation related fatigue and right surgical scar tenderness and dermatitis: She is working with Dr. Kinard RTC in 3 weeks for Herceptin maintenance and every 6 weeks for follow-up with me.      No orders of the defined types were placed in this encounter.  The patient has a good understanding of the overall plan. she agrees with it. she will call with any problems that may develop before the next visit here. Total time spent: 30 mins including face to face time and time spent for planning, charting and co-ordination of care   Viinay K Gudena, MD 07/22/22    I Deritra, Mcnairy am scribing for Dr. Gudena  I have reviewed the above documentation for accuracy and completeness, and I agree with the above.   

## 2022-07-20 ENCOUNTER — Other Ambulatory Visit: Payer: Self-pay

## 2022-07-20 ENCOUNTER — Ambulatory Visit
Admission: RE | Admit: 2022-07-20 | Discharge: 2022-07-20 | Disposition: A | Discharge status: Home / Self Care | Payer: BC Managed Care – PPO | Source: Ambulatory Visit | Attending: Radiation Oncology | Admitting: Radiation Oncology

## 2022-07-20 ENCOUNTER — Ambulatory Visit: Payer: BC Managed Care – PPO

## 2022-07-20 DIAGNOSIS — C50411 Malignant neoplasm of upper-outer quadrant of right female breast: Secondary | ICD-10-CM | POA: Diagnosis not present

## 2022-07-20 DIAGNOSIS — Z170 Estrogen receptor positive status [ER+]: Secondary | ICD-10-CM | POA: Diagnosis not present

## 2022-07-20 DIAGNOSIS — N6311 Unspecified lump in the right breast, upper outer quadrant: Secondary | ICD-10-CM | POA: Diagnosis not present

## 2022-07-20 DIAGNOSIS — Z510 Encounter for antineoplastic radiation therapy: Secondary | ICD-10-CM | POA: Diagnosis not present

## 2022-07-20 LAB — RAD ONC ARIA SESSION SUMMARY
Course Elapsed Days: 5
Plan Fractions Treated to Date: 4
Plan Prescribed Dose Per Fraction: 1.8 Gy
Plan Total Fractions Prescribed: 28
Plan Total Prescribed Dose: 50.4 Gy
Reference Point Dosage Given to Date: 7.2 Gy
Reference Point Session Dosage Given: 1.8 Gy
Session Number: 4

## 2022-07-20 MED ORDER — SILVER SULFADIAZINE 1 % EX CREA: TOPICAL_CREAM | Freq: Every day | CUTANEOUS | Status: DC

## 2022-07-21 ENCOUNTER — Ambulatory Visit: Payer: BC Managed Care – PPO

## 2022-07-21 ENCOUNTER — Other Ambulatory Visit: Payer: Self-pay

## 2022-07-21 ENCOUNTER — Ambulatory Visit
Admission: RE | Admit: 2022-07-21 | Discharge: 2022-07-21 | Disposition: A | Discharge status: Home / Self Care | Payer: BC Managed Care – PPO | Source: Ambulatory Visit | Attending: Radiation Oncology | Admitting: Radiation Oncology

## 2022-07-21 DIAGNOSIS — Z170 Estrogen receptor positive status [ER+]: Secondary | ICD-10-CM | POA: Diagnosis not present

## 2022-07-21 DIAGNOSIS — N6311 Unspecified lump in the right breast, upper outer quadrant: Secondary | ICD-10-CM | POA: Diagnosis not present

## 2022-07-21 DIAGNOSIS — Z510 Encounter for antineoplastic radiation therapy: Secondary | ICD-10-CM | POA: Diagnosis not present

## 2022-07-21 DIAGNOSIS — C50411 Malignant neoplasm of upper-outer quadrant of right female breast: Secondary | ICD-10-CM | POA: Diagnosis not present

## 2022-07-21 LAB — RAD ONC ARIA SESSION SUMMARY
Course Elapsed Days: 6
Plan Fractions Treated to Date: 5
Plan Prescribed Dose Per Fraction: 1.8 Gy
Plan Total Fractions Prescribed: 28
Plan Total Prescribed Dose: 50.4 Gy
Reference Point Dosage Given to Date: 9 Gy
Reference Point Session Dosage Given: 1.8 Gy
Session Number: 5

## 2022-07-22 ENCOUNTER — Inpatient Hospital Stay: Payer: BC Managed Care – PPO

## 2022-07-22 ENCOUNTER — Ambulatory Visit: Payer: BC Managed Care – PPO

## 2022-07-22 ENCOUNTER — Inpatient Hospital Stay (HOSPITAL_BASED_OUTPATIENT_CLINIC_OR_DEPARTMENT_OTHER): Payer: BC Managed Care – PPO | Admitting: Hematology and Oncology

## 2022-07-22 ENCOUNTER — Other Ambulatory Visit: Payer: Self-pay

## 2022-07-22 ENCOUNTER — Ambulatory Visit
Admission: RE | Admit: 2022-07-22 | Discharge: 2022-07-22 | Disposition: A | Discharge status: Home / Self Care | Payer: BC Managed Care – PPO | Source: Ambulatory Visit | Attending: Radiation Oncology | Admitting: Radiation Oncology

## 2022-07-22 DIAGNOSIS — Z7952 Long term (current) use of systemic steroids: Secondary | ICD-10-CM | POA: Insufficient documentation

## 2022-07-22 DIAGNOSIS — N6311 Unspecified lump in the right breast, upper outer quadrant: Secondary | ICD-10-CM | POA: Diagnosis not present

## 2022-07-22 DIAGNOSIS — Z5112 Encounter for antineoplastic immunotherapy: Secondary | ICD-10-CM | POA: Insufficient documentation

## 2022-07-22 DIAGNOSIS — C50411 Malignant neoplasm of upper-outer quadrant of right female breast: Secondary | ICD-10-CM

## 2022-07-22 DIAGNOSIS — Z95828 Presence of other vascular implants and grafts: Secondary | ICD-10-CM

## 2022-07-22 DIAGNOSIS — Z170 Estrogen receptor positive status [ER+]: Secondary | ICD-10-CM | POA: Insufficient documentation

## 2022-07-22 DIAGNOSIS — Z510 Encounter for antineoplastic radiation therapy: Secondary | ICD-10-CM | POA: Diagnosis not present

## 2022-07-22 DIAGNOSIS — Z79899 Other long term (current) drug therapy: Secondary | ICD-10-CM | POA: Insufficient documentation

## 2022-07-22 LAB — CBC WITH DIFFERENTIAL (CANCER CENTER ONLY)
Abs Immature Granulocytes: 0.01 10*3/uL (ref 0.00–0.07)
Basophils Absolute: 0 10*3/uL (ref 0.0–0.1)
Basophils Relative: 1 %
Eosinophils Absolute: 0.2 10*3/uL (ref 0.0–0.5)
Eosinophils Relative: 4 %
HCT: 40.4 % (ref 36.0–46.0)
Hemoglobin: 14.1 g/dL (ref 12.0–15.0)
Immature Granulocytes: 0 %
Lymphocytes Relative: 42 %
Lymphs Abs: 1.9 10*3/uL (ref 0.7–4.0)
MCH: 33.7 pg (ref 26.0–34.0)
MCHC: 34.9 g/dL (ref 30.0–36.0)
MCV: 96.4 fL (ref 80.0–100.0)
Monocytes Absolute: 0.4 10*3/uL (ref 0.1–1.0)
Monocytes Relative: 8 %
Neutro Abs: 2 10*3/uL (ref 1.7–7.7)
Neutrophils Relative %: 45 %
Platelet Count: 206 10*3/uL (ref 150–400)
RBC: 4.19 MIL/uL (ref 3.87–5.11)
RDW: 12.5 % (ref 11.5–15.5)
WBC Count: 4.4 10*3/uL (ref 4.0–10.5)
nRBC: 0 % (ref 0.0–0.2)

## 2022-07-22 LAB — CMP (CANCER CENTER ONLY)
ALT: 15 U/L (ref 0–44)
AST: 19 U/L (ref 15–41)
Albumin: 4.3 g/dL (ref 3.5–5.0)
Alkaline Phosphatase: 64 U/L (ref 38–126)
Anion gap: 7 (ref 5–15)
BUN: 9 mg/dL (ref 6–20)
CO2: 27 mmol/L (ref 22–32)
Calcium: 9.4 mg/dL (ref 8.9–10.3)
Chloride: 102 mmol/L (ref 98–111)
Creatinine: 0.73 mg/dL (ref 0.44–1.00)
GFR, Estimated: >60 mL/min (ref >60)
Glucose, Bld: 93 mg/dL (ref 70–99)
Potassium: 4.1 mmol/L (ref 3.5–5.1)
Sodium: 136 mmol/L (ref 135–145)
Total Bilirubin: 0.4 mg/dL (ref 0.3–1.2)
Total Protein: 6.9 g/dL (ref 6.5–8.1)

## 2022-07-22 LAB — RAD ONC ARIA SESSION SUMMARY
Course Elapsed Days: 7
Plan Fractions Treated to Date: 6
Plan Prescribed Dose Per Fraction: 1.8 Gy
Plan Total Fractions Prescribed: 28
Plan Total Prescribed Dose: 50.4 Gy
Reference Point Dosage Given to Date: 10.8 Gy
Reference Point Session Dosage Given: 1.8 Gy
Session Number: 6

## 2022-07-22 MED ORDER — LORATADINE 10 MG PO TABS
10.0000 mg | ORAL_TABLET | Freq: Every day | ORAL | Status: DC
  Administered 2022-07-22: 10 mg via ORAL
  Filled 2022-07-22: qty 1

## 2022-07-22 MED ORDER — ACETAMINOPHEN 325 MG PO TABS
650.0000 mg | ORAL_TABLET | Freq: Once | ORAL | Status: AC
  Administered 2022-07-22: 650 mg via ORAL
  Filled 2022-07-22: qty 2

## 2022-07-22 MED ORDER — SODIUM CHLORIDE 0.9 % IV SOLN: Freq: Once | INTRAVENOUS | Status: AC

## 2022-07-22 MED ORDER — SODIUM CHLORIDE 0.9% FLUSH
10.0000 mL | INTRAVENOUS | Status: DC | PRN
  Administered 2022-07-22: 10 mL

## 2022-07-22 MED ORDER — SODIUM CHLORIDE 0.9% FLUSH
10.0000 mL | Freq: Once | INTRAVENOUS | Status: AC
  Administered 2022-07-22: 10 mL

## 2022-07-22 MED ORDER — TRASTUZUMAB-DKST CHEMO 150 MG IV SOLR
6.0000 mg/kg | Freq: Once | INTRAVENOUS | Status: AC
  Administered 2022-07-22: 483 mg via INTRAVENOUS
  Filled 2022-07-22: qty 23

## 2022-07-22 MED ORDER — HEPARIN SOD (PORK) LOCK FLUSH 100 UNIT/ML IV SOLN
500.0000 [IU] | Freq: Once | INTRAVENOUS | Status: AC | PRN
  Administered 2022-07-22: 500 [IU]

## 2022-07-22 NOTE — Patient Instructions (Signed)
[facility]COL[facility]   Discharge Instructions: Thank you for choosing [facility] to provide your oncology and hematology care.   If you have a lab appointment with the Cancer Center, please go directly to the Cancer Center and check in at the registration area.   Wear comfortable clothing and clothing appropriate for easy access to any Portacath or PICC line.   We strive to give you quality time with your provider. You may need to reschedule your appointment if you arrive late (15 or more minutes).  Arriving late affects you and other patients whose appointments are after yours.  Also, if you miss three or more appointments without notifying the office, you may be dismissed from the clinic at the provider's discretion.      For prescription refill requests, have your pharmacy contact our office and allow 72 hours for refills to be completed.    Today you received the following chemotherapy and/or immunotherapy agents: [id number]      To help prevent nausea and vomiting after your treatment, we encourage you to take your nausea medication as directed.  BELOW ARE SYMPTOMS THAT SHOULD BE REPORTED IMMEDIATELY: *FEVER GREATER THAN 100.4 F (38 C) OR HIGHER *CHILLS OR SWEATING *NAUSEA AND VOMITING THAT IS NOT CONTROLLED WITH YOUR NAUSEA MEDICATION *UNUSUAL SHORTNESS OF BREATH *UNUSUAL BRUISING OR BLEEDING *URINARY PROBLEMS (pain or burning when urinating, or frequent urination) *BOWEL PROBLEMS (unusual diarrhea, constipation, pain near the anus) TENDERNESS IN MOUTH AND THROAT WITH OR WITHOUT PRESENCE OF ULCERS (sore throat, sores in mouth, or a toothache) UNUSUAL RASH, SWELLING OR PAIN  UNUSUAL VAGINAL DISCHARGE OR ITCHING   Items with * indicate a potential emergency and should be followed up as soon as possible or go to the Emergency Department if any problems should occur.  Please show the CHEMOTHERAPY ALERT CARD or IMMUNOTHERAPY ALERT CARD at  check-in to the Emergency Department and triage nurse.  Should you have questions after your visit or need to cancel or reschedule your appointment, please contact [facility]COL[facility]  Dept: [phone]  and follow the prompts.  Office hours are 8:00 a.m. to 4:30 p.m. Monday - Friday. Please note that voicemails left after 4:00 p.m. may not be returned until the following business day.  We are closed weekends and major holidays. You have access to a nurse at all times for urgent questions. Please call the main number to the clinic Dept: [phone] and follow the prompts.   For any non-urgent questions, you may also contact your provider using MyChart. We now offer e-Visits for anyone 18 and older to request care online for non-urgent symptoms. For details visit [email or web address].   Also download the MyChart app! Go to the app store, search "MyChart", open the app, select , and log in with your MyChart username and password.  Masks are optional in the cancer centers. If you would like for your care team to wear a mask while they are taking care of you, please let them know. You may have one support person who is at least 52 years old accompany you for your appointments. 

## 2022-07-22 NOTE — Assessment & Plan Note (Signed)
11/19/2021:Palpable lump for 3 weeks, diagnostic mammogram: 2.2 cm x 2.2 cm irregular mass with a spiculated margin in the upper outer right breast. 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 HER2 positive, Ki-67 15% MRI Breast 12/09/21: Right Breast retroareolar mass 6.7 cm (involves nipple areolar complex), Rt axilla 2.1 cm node, Left breast: Several scattered foci left breast indeterminate 0.6 cm mass LIQ left breast and another indeterminate 0.6 cm mass in upper central Left breast  Treatment Plan: 1. Neoadjuvant chemotherapy withTCHP foll by HP vs Kadcyla maintenance 2. 05/05/22: Bilateral lumpectomies: Path CR 3. Followed by adjuvant radiation therapy 07/16/2022-08/23/2022 4.  Adjuvant antiestrogen therapy ------------------------------------------------------------------------------------------------------------------------------------ Current Treatment:Herceptin maintenance Chemo toxicities:  Monitoring closely for toxicities  Echocardiogram 03/17/2022: EF 58%   RTCin 3 weeks for Herceptin maintenanceand every 6 weeks for follow-up with me.

## 2022-07-23 ENCOUNTER — Ambulatory Visit: Payer: BC Managed Care – PPO

## 2022-07-23 ENCOUNTER — Ambulatory Visit
Admission: RE | Admit: 2022-07-23 | Discharge: 2022-07-23 | Disposition: A | Discharge status: Home / Self Care | Payer: BC Managed Care – PPO | Source: Ambulatory Visit | Attending: Radiation Oncology | Admitting: Radiation Oncology

## 2022-07-23 ENCOUNTER — Other Ambulatory Visit: Payer: Self-pay

## 2022-07-23 DIAGNOSIS — N6311 Unspecified lump in the right breast, upper outer quadrant: Secondary | ICD-10-CM | POA: Diagnosis not present

## 2022-07-23 DIAGNOSIS — Z510 Encounter for antineoplastic radiation therapy: Secondary | ICD-10-CM | POA: Diagnosis not present

## 2022-07-23 DIAGNOSIS — Z170 Estrogen receptor positive status [ER+]: Secondary | ICD-10-CM

## 2022-07-23 DIAGNOSIS — C50411 Malignant neoplasm of upper-outer quadrant of right female breast: Secondary | ICD-10-CM | POA: Diagnosis not present

## 2022-07-23 LAB — RAD ONC ARIA SESSION SUMMARY
Course Elapsed Days: 8
Plan Fractions Treated to Date: 7
Plan Prescribed Dose Per Fraction: 1.8 Gy
Plan Total Fractions Prescribed: 28
Plan Total Prescribed Dose: 50.4 Gy
Reference Point Dosage Given to Date: 12.6 Gy
Reference Point Session Dosage Given: 1.8 Gy
Session Number: 7

## 2022-07-23 MED ORDER — RADIAPLEXRX EX GEL: Freq: Once | CUTANEOUS | Status: AC

## 2022-07-25 ENCOUNTER — Encounter: Payer: Self-pay | Admitting: Hematology and Oncology

## 2022-07-26 ENCOUNTER — Other Ambulatory Visit: Payer: Self-pay

## 2022-07-26 ENCOUNTER — Ambulatory Visit
Admission: RE | Admit: 2022-07-26 | Discharge: 2022-07-26 | Disposition: A | Discharge status: Home / Self Care | Payer: BC Managed Care – PPO | Source: Ambulatory Visit | Attending: Radiation Oncology | Admitting: Radiation Oncology

## 2022-07-26 ENCOUNTER — Ambulatory Visit: Payer: BC Managed Care – PPO

## 2022-07-26 DIAGNOSIS — Z170 Estrogen receptor positive status [ER+]: Secondary | ICD-10-CM | POA: Diagnosis not present

## 2022-07-26 DIAGNOSIS — C50411 Malignant neoplasm of upper-outer quadrant of right female breast: Secondary | ICD-10-CM | POA: Diagnosis not present

## 2022-07-26 DIAGNOSIS — N6311 Unspecified lump in the right breast, upper outer quadrant: Secondary | ICD-10-CM | POA: Diagnosis not present

## 2022-07-26 DIAGNOSIS — Z510 Encounter for antineoplastic radiation therapy: Secondary | ICD-10-CM | POA: Diagnosis not present

## 2022-07-26 LAB — RAD ONC ARIA SESSION SUMMARY
Course Elapsed Days: 11
Plan Fractions Treated to Date: 8
Plan Prescribed Dose Per Fraction: 1.8 Gy
Plan Total Fractions Prescribed: 28
Plan Total Prescribed Dose: 50.4 Gy
Reference Point Dosage Given to Date: 14.4 Gy
Reference Point Session Dosage Given: 1.8 Gy
Session Number: 8

## 2022-07-27 ENCOUNTER — Ambulatory Visit: Payer: BC Managed Care – PPO

## 2022-07-27 ENCOUNTER — Other Ambulatory Visit: Payer: Self-pay

## 2022-07-27 ENCOUNTER — Ambulatory Visit
Admission: RE | Admit: 2022-07-27 | Discharge: 2022-07-27 | Disposition: A | Discharge status: Home / Self Care | Payer: BC Managed Care – PPO | Source: Ambulatory Visit | Attending: Radiation Oncology | Admitting: Radiation Oncology

## 2022-07-27 DIAGNOSIS — N6311 Unspecified lump in the right breast, upper outer quadrant: Secondary | ICD-10-CM | POA: Diagnosis not present

## 2022-07-27 DIAGNOSIS — C50411 Malignant neoplasm of upper-outer quadrant of right female breast: Secondary | ICD-10-CM | POA: Diagnosis not present

## 2022-07-27 DIAGNOSIS — Z170 Estrogen receptor positive status [ER+]: Secondary | ICD-10-CM | POA: Diagnosis not present

## 2022-07-27 DIAGNOSIS — Z510 Encounter for antineoplastic radiation therapy: Secondary | ICD-10-CM | POA: Diagnosis not present

## 2022-07-27 LAB — RAD ONC ARIA SESSION SUMMARY
Course Elapsed Days: 12
Plan Fractions Treated to Date: 9
Plan Prescribed Dose Per Fraction: 1.8 Gy
Plan Total Fractions Prescribed: 28
Plan Total Prescribed Dose: 50.4 Gy
Reference Point Dosage Given to Date: 16.2 Gy
Reference Point Session Dosage Given: 1.8 Gy
Session Number: 9

## 2022-07-28 ENCOUNTER — Ambulatory Visit
Admission: RE | Admit: 2022-07-28 | Discharge: 2022-07-28 | Disposition: A | Discharge status: Home / Self Care | Payer: BC Managed Care – PPO | Source: Ambulatory Visit | Attending: Radiation Oncology | Admitting: Radiation Oncology

## 2022-07-28 ENCOUNTER — Ambulatory Visit: Payer: BC Managed Care – PPO

## 2022-07-28 ENCOUNTER — Other Ambulatory Visit: Payer: Self-pay

## 2022-07-28 DIAGNOSIS — Z170 Estrogen receptor positive status [ER+]: Secondary | ICD-10-CM | POA: Diagnosis not present

## 2022-07-28 DIAGNOSIS — N6311 Unspecified lump in the right breast, upper outer quadrant: Secondary | ICD-10-CM | POA: Diagnosis not present

## 2022-07-28 DIAGNOSIS — C50411 Malignant neoplasm of upper-outer quadrant of right female breast: Secondary | ICD-10-CM | POA: Diagnosis not present

## 2022-07-28 DIAGNOSIS — Z510 Encounter for antineoplastic radiation therapy: Secondary | ICD-10-CM | POA: Diagnosis not present

## 2022-07-28 LAB — RAD ONC ARIA SESSION SUMMARY
Course Elapsed Days: 13
Plan Fractions Treated to Date: 10
Plan Prescribed Dose Per Fraction: 1.8 Gy
Plan Total Fractions Prescribed: 28
Plan Total Prescribed Dose: 50.4 Gy
Reference Point Dosage Given to Date: 18 Gy
Reference Point Session Dosage Given: 1.8 Gy
Session Number: 10

## 2022-07-29 ENCOUNTER — Ambulatory Visit: Payer: BC Managed Care – PPO

## 2022-07-29 ENCOUNTER — Other Ambulatory Visit: Payer: Self-pay

## 2022-07-29 ENCOUNTER — Ambulatory Visit
Admission: RE | Admit: 2022-07-29 | Discharge: 2022-07-29 | Disposition: A | Discharge status: Home / Self Care | Payer: BC Managed Care – PPO | Source: Ambulatory Visit | Attending: Radiation Oncology | Admitting: Radiation Oncology

## 2022-07-29 DIAGNOSIS — N6311 Unspecified lump in the right breast, upper outer quadrant: Secondary | ICD-10-CM | POA: Diagnosis not present

## 2022-07-29 DIAGNOSIS — C50411 Malignant neoplasm of upper-outer quadrant of right female breast: Secondary | ICD-10-CM | POA: Diagnosis not present

## 2022-07-29 DIAGNOSIS — Z510 Encounter for antineoplastic radiation therapy: Secondary | ICD-10-CM | POA: Diagnosis not present

## 2022-07-29 DIAGNOSIS — Z170 Estrogen receptor positive status [ER+]: Secondary | ICD-10-CM | POA: Diagnosis not present

## 2022-07-29 LAB — RAD ONC ARIA SESSION SUMMARY
Course Elapsed Days: 14
Plan Fractions Treated to Date: 11
Plan Prescribed Dose Per Fraction: 1.8 Gy
Plan Total Fractions Prescribed: 28
Plan Total Prescribed Dose: 50.4 Gy
Reference Point Dosage Given to Date: 19.8 Gy
Reference Point Session Dosage Given: 1.8 Gy
Session Number: 11

## 2022-07-30 ENCOUNTER — Other Ambulatory Visit: Payer: Self-pay

## 2022-07-30 ENCOUNTER — Ambulatory Visit: Payer: BC Managed Care – PPO

## 2022-07-30 ENCOUNTER — Ambulatory Visit
Admission: RE | Admit: 2022-07-30 | Discharge: 2022-07-30 | Disposition: A | Discharge status: Home / Self Care | Payer: BC Managed Care – PPO | Source: Ambulatory Visit | Attending: Radiation Oncology | Admitting: Radiation Oncology

## 2022-07-30 DIAGNOSIS — N6311 Unspecified lump in the right breast, upper outer quadrant: Secondary | ICD-10-CM | POA: Diagnosis not present

## 2022-07-30 DIAGNOSIS — Z170 Estrogen receptor positive status [ER+]: Secondary | ICD-10-CM | POA: Diagnosis not present

## 2022-07-30 DIAGNOSIS — C50411 Malignant neoplasm of upper-outer quadrant of right female breast: Secondary | ICD-10-CM

## 2022-07-30 DIAGNOSIS — Z510 Encounter for antineoplastic radiation therapy: Secondary | ICD-10-CM | POA: Diagnosis not present

## 2022-07-30 LAB — RAD ONC ARIA SESSION SUMMARY
Course Elapsed Days: 15
Plan Fractions Treated to Date: 12
Plan Prescribed Dose Per Fraction: 1.8 Gy
Plan Total Fractions Prescribed: 28
Plan Total Prescribed Dose: 50.4 Gy
Reference Point Dosage Given to Date: 21.6 Gy
Reference Point Session Dosage Given: 1.8 Gy
Session Number: 12

## 2022-07-30 MED ORDER — RADIAPLEXRX EX GEL: Freq: Once | CUTANEOUS | Status: AC

## 2022-08-02 ENCOUNTER — Encounter: Payer: Self-pay | Admitting: Hematology and Oncology

## 2022-08-02 ENCOUNTER — Other Ambulatory Visit: Payer: Self-pay

## 2022-08-02 ENCOUNTER — Ambulatory Visit: Payer: BC Managed Care – PPO

## 2022-08-02 ENCOUNTER — Ambulatory Visit
Admission: RE | Admit: 2022-08-02 | Discharge: 2022-08-02 | Disposition: A | Discharge status: Home / Self Care | Payer: BC Managed Care – PPO | Source: Ambulatory Visit | Attending: Radiation Oncology | Admitting: Radiation Oncology

## 2022-08-02 DIAGNOSIS — N6311 Unspecified lump in the right breast, upper outer quadrant: Secondary | ICD-10-CM | POA: Diagnosis not present

## 2022-08-02 DIAGNOSIS — Z510 Encounter for antineoplastic radiation therapy: Secondary | ICD-10-CM | POA: Diagnosis not present

## 2022-08-02 DIAGNOSIS — Z170 Estrogen receptor positive status [ER+]: Secondary | ICD-10-CM | POA: Diagnosis not present

## 2022-08-02 DIAGNOSIS — C50411 Malignant neoplasm of upper-outer quadrant of right female breast: Secondary | ICD-10-CM | POA: Diagnosis not present

## 2022-08-02 LAB — RAD ONC ARIA SESSION SUMMARY
Course Elapsed Days: 18
Plan Fractions Treated to Date: 13
Plan Prescribed Dose Per Fraction: 1.8 Gy
Plan Total Fractions Prescribed: 28
Plan Total Prescribed Dose: 50.4 Gy
Reference Point Dosage Given to Date: 23.4 Gy
Reference Point Session Dosage Given: 1.8 Gy
Session Number: 13

## 2022-08-03 ENCOUNTER — Ambulatory Visit: Payer: BC Managed Care – PPO

## 2022-08-03 ENCOUNTER — Other Ambulatory Visit: Payer: Self-pay

## 2022-08-03 ENCOUNTER — Ambulatory Visit
Admission: RE | Admit: 2022-08-03 | Discharge: 2022-08-03 | Disposition: A | Discharge status: Home / Self Care | Payer: BC Managed Care – PPO | Source: Ambulatory Visit | Attending: Radiation Oncology | Admitting: Radiation Oncology

## 2022-08-03 DIAGNOSIS — Z510 Encounter for antineoplastic radiation therapy: Secondary | ICD-10-CM | POA: Diagnosis not present

## 2022-08-03 DIAGNOSIS — N6311 Unspecified lump in the right breast, upper outer quadrant: Secondary | ICD-10-CM | POA: Diagnosis not present

## 2022-08-03 DIAGNOSIS — C50411 Malignant neoplasm of upper-outer quadrant of right female breast: Secondary | ICD-10-CM

## 2022-08-03 DIAGNOSIS — Z170 Estrogen receptor positive status [ER+]: Secondary | ICD-10-CM | POA: Diagnosis not present

## 2022-08-03 LAB — RAD ONC ARIA SESSION SUMMARY
Course Elapsed Days: 19
Plan Fractions Treated to Date: 14
Plan Prescribed Dose Per Fraction: 1.8 Gy
Plan Total Fractions Prescribed: 28
Plan Total Prescribed Dose: 50.4 Gy
Reference Point Dosage Given to Date: 25.2 Gy
Reference Point Session Dosage Given: 1.8 Gy
Session Number: 14

## 2022-08-03 MED ORDER — RADIAPLEXRX EX GEL: Freq: Once | CUTANEOUS | Status: AC

## 2022-08-04 ENCOUNTER — Ambulatory Visit: Payer: BC Managed Care – PPO

## 2022-08-04 ENCOUNTER — Other Ambulatory Visit: Payer: Self-pay

## 2022-08-04 ENCOUNTER — Ambulatory Visit
Admission: RE | Admit: 2022-08-04 | Discharge: 2022-08-04 | Disposition: A | Discharge status: Home / Self Care | Payer: BC Managed Care – PPO | Source: Ambulatory Visit | Attending: Radiation Oncology | Admitting: Radiation Oncology

## 2022-08-04 DIAGNOSIS — Z510 Encounter for antineoplastic radiation therapy: Secondary | ICD-10-CM | POA: Diagnosis not present

## 2022-08-04 DIAGNOSIS — C50411 Malignant neoplasm of upper-outer quadrant of right female breast: Secondary | ICD-10-CM

## 2022-08-04 DIAGNOSIS — Z170 Estrogen receptor positive status [ER+]: Secondary | ICD-10-CM | POA: Diagnosis not present

## 2022-08-04 DIAGNOSIS — N6311 Unspecified lump in the right breast, upper outer quadrant: Secondary | ICD-10-CM | POA: Diagnosis not present

## 2022-08-04 LAB — RAD ONC ARIA SESSION SUMMARY
Course Elapsed Days: 20
Plan Fractions Treated to Date: 15
Plan Prescribed Dose Per Fraction: 1.8 Gy
Plan Total Fractions Prescribed: 28
Plan Total Prescribed Dose: 50.4 Gy
Reference Point Dosage Given to Date: 27 Gy
Reference Point Session Dosage Given: 1.8 Gy
Session Number: 15

## 2022-08-05 ENCOUNTER — Ambulatory Visit: Payer: BC Managed Care – PPO

## 2022-08-05 ENCOUNTER — Other Ambulatory Visit: Payer: Self-pay

## 2022-08-05 ENCOUNTER — Ambulatory Visit
Admission: RE | Admit: 2022-08-05 | Discharge: 2022-08-05 | Disposition: A | Discharge status: Home / Self Care | Payer: BC Managed Care – PPO | Source: Ambulatory Visit | Attending: Radiation Oncology | Admitting: Radiation Oncology

## 2022-08-05 DIAGNOSIS — C50411 Malignant neoplasm of upper-outer quadrant of right female breast: Secondary | ICD-10-CM | POA: Diagnosis not present

## 2022-08-05 DIAGNOSIS — Z170 Estrogen receptor positive status [ER+]: Secondary | ICD-10-CM | POA: Diagnosis not present

## 2022-08-05 DIAGNOSIS — Z510 Encounter for antineoplastic radiation therapy: Secondary | ICD-10-CM | POA: Diagnosis not present

## 2022-08-05 DIAGNOSIS — N6311 Unspecified lump in the right breast, upper outer quadrant: Secondary | ICD-10-CM | POA: Diagnosis not present

## 2022-08-05 LAB — RAD ONC ARIA SESSION SUMMARY
Course Elapsed Days: 21
Plan Fractions Treated to Date: 16
Plan Prescribed Dose Per Fraction: 1.8 Gy
Plan Total Fractions Prescribed: 28
Plan Total Prescribed Dose: 50.4 Gy
Reference Point Dosage Given to Date: 28.8 Gy
Reference Point Session Dosage Given: 1.8 Gy
Session Number: 16

## 2022-08-06 ENCOUNTER — Other Ambulatory Visit: Payer: Self-pay

## 2022-08-06 ENCOUNTER — Inpatient Hospital Stay: Payer: BC Managed Care – PPO

## 2022-08-06 ENCOUNTER — Ambulatory Visit: Payer: BC Managed Care – PPO

## 2022-08-06 ENCOUNTER — Encounter: Payer: Self-pay | Admitting: Hematology and Oncology

## 2022-08-06 ENCOUNTER — Ambulatory Visit
Admission: RE | Admit: 2022-08-06 | Discharge: 2022-08-06 | Disposition: A | Discharge status: Home / Self Care | Payer: BC Managed Care – PPO | Source: Ambulatory Visit | Attending: Radiation Oncology | Admitting: Radiation Oncology

## 2022-08-06 DIAGNOSIS — N6311 Unspecified lump in the right breast, upper outer quadrant: Secondary | ICD-10-CM | POA: Diagnosis not present

## 2022-08-06 DIAGNOSIS — C50411 Malignant neoplasm of upper-outer quadrant of right female breast: Secondary | ICD-10-CM | POA: Diagnosis not present

## 2022-08-06 DIAGNOSIS — Z170 Estrogen receptor positive status [ER+]: Secondary | ICD-10-CM | POA: Diagnosis not present

## 2022-08-06 DIAGNOSIS — Z510 Encounter for antineoplastic radiation therapy: Secondary | ICD-10-CM | POA: Diagnosis not present

## 2022-08-06 LAB — RAD ONC ARIA SESSION SUMMARY
Course Elapsed Days: 22
Plan Fractions Treated to Date: 17
Plan Prescribed Dose Per Fraction: 1.8 Gy
Plan Total Fractions Prescribed: 28
Plan Total Prescribed Dose: 50.4 Gy
Reference Point Dosage Given to Date: 30.6 Gy
Reference Point Session Dosage Given: 1.8 Gy
Session Number: 17

## 2022-08-09 ENCOUNTER — Ambulatory Visit: Payer: BC Managed Care – PPO

## 2022-08-09 ENCOUNTER — Ambulatory Visit
Admission: RE | Admit: 2022-08-09 | Discharge: 2022-08-09 | Disposition: A | Discharge status: Home / Self Care | Payer: BC Managed Care – PPO | Source: Ambulatory Visit | Attending: Radiation Oncology | Admitting: Radiation Oncology

## 2022-08-09 ENCOUNTER — Other Ambulatory Visit: Payer: Self-pay

## 2022-08-09 DIAGNOSIS — Z510 Encounter for antineoplastic radiation therapy: Secondary | ICD-10-CM | POA: Diagnosis not present

## 2022-08-09 DIAGNOSIS — Z79810 Long term (current) use of selective estrogen receptor modulators (SERMs): Secondary | ICD-10-CM | POA: Insufficient documentation

## 2022-08-09 DIAGNOSIS — Z170 Estrogen receptor positive status [ER+]: Secondary | ICD-10-CM | POA: Insufficient documentation

## 2022-08-09 DIAGNOSIS — C50411 Malignant neoplasm of upper-outer quadrant of right female breast: Secondary | ICD-10-CM | POA: Diagnosis not present

## 2022-08-09 DIAGNOSIS — Z923 Personal history of irradiation: Secondary | ICD-10-CM | POA: Diagnosis not present

## 2022-08-09 DIAGNOSIS — Z5112 Encounter for antineoplastic immunotherapy: Secondary | ICD-10-CM | POA: Insufficient documentation

## 2022-08-09 LAB — RAD ONC ARIA SESSION SUMMARY
Course Elapsed Days: 25
Plan Fractions Treated to Date: 18
Plan Prescribed Dose Per Fraction: 1.8 Gy
Plan Total Fractions Prescribed: 28
Plan Total Prescribed Dose: 50.4 Gy
Reference Point Dosage Given to Date: 32.4 Gy
Reference Point Session Dosage Given: 1.8 Gy
Session Number: 18

## 2022-08-10 ENCOUNTER — Ambulatory Visit: Payer: BC Managed Care – PPO

## 2022-08-10 ENCOUNTER — Ambulatory Visit
Admission: RE | Admit: 2022-08-10 | Discharge: 2022-08-10 | Disposition: A | Discharge status: Home / Self Care | Payer: BC Managed Care – PPO | Source: Ambulatory Visit | Attending: Radiation Oncology | Admitting: Radiation Oncology

## 2022-08-10 ENCOUNTER — Other Ambulatory Visit: Payer: Self-pay

## 2022-08-10 DIAGNOSIS — Z170 Estrogen receptor positive status [ER+]: Secondary | ICD-10-CM | POA: Diagnosis not present

## 2022-08-10 DIAGNOSIS — Z79810 Long term (current) use of selective estrogen receptor modulators (SERMs): Secondary | ICD-10-CM | POA: Diagnosis not present

## 2022-08-10 DIAGNOSIS — Z5112 Encounter for antineoplastic immunotherapy: Secondary | ICD-10-CM | POA: Diagnosis not present

## 2022-08-10 DIAGNOSIS — Z510 Encounter for antineoplastic radiation therapy: Secondary | ICD-10-CM | POA: Diagnosis not present

## 2022-08-10 DIAGNOSIS — C50411 Malignant neoplasm of upper-outer quadrant of right female breast: Secondary | ICD-10-CM

## 2022-08-10 DIAGNOSIS — Z923 Personal history of irradiation: Secondary | ICD-10-CM | POA: Diagnosis not present

## 2022-08-10 LAB — RAD ONC ARIA SESSION SUMMARY
Course Elapsed Days: 26
Plan Fractions Treated to Date: 19
Plan Prescribed Dose Per Fraction: 1.8 Gy
Plan Total Fractions Prescribed: 28
Plan Total Prescribed Dose: 50.4 Gy
Reference Point Dosage Given to Date: 34.2 Gy
Reference Point Session Dosage Given: 1.8 Gy
Session Number: 19

## 2022-08-10 MED ORDER — SILVER SULFADIAZINE 1 % EX CREA: TOPICAL_CREAM | Freq: Once | CUTANEOUS | Status: AC

## 2022-08-10 MED ORDER — RADIAPLEXRX EX GEL: Freq: Once | CUTANEOUS | Status: AC

## 2022-08-11 ENCOUNTER — Ambulatory Visit: Payer: BC Managed Care – PPO

## 2022-08-11 ENCOUNTER — Encounter: Payer: Self-pay | Admitting: Hematology and Oncology

## 2022-08-11 ENCOUNTER — Ambulatory Visit
Admission: RE | Admit: 2022-08-11 | Discharge: 2022-08-11 | Disposition: A | Discharge status: Home / Self Care | Payer: BC Managed Care – PPO | Source: Ambulatory Visit | Attending: Radiation Oncology | Admitting: Radiation Oncology

## 2022-08-11 ENCOUNTER — Other Ambulatory Visit: Payer: Self-pay

## 2022-08-11 DIAGNOSIS — Z79810 Long term (current) use of selective estrogen receptor modulators (SERMs): Secondary | ICD-10-CM | POA: Diagnosis not present

## 2022-08-11 DIAGNOSIS — Z923 Personal history of irradiation: Secondary | ICD-10-CM | POA: Diagnosis not present

## 2022-08-11 DIAGNOSIS — Z510 Encounter for antineoplastic radiation therapy: Secondary | ICD-10-CM | POA: Diagnosis not present

## 2022-08-11 DIAGNOSIS — C50411 Malignant neoplasm of upper-outer quadrant of right female breast: Secondary | ICD-10-CM | POA: Diagnosis not present

## 2022-08-11 DIAGNOSIS — Z5112 Encounter for antineoplastic immunotherapy: Secondary | ICD-10-CM | POA: Diagnosis not present

## 2022-08-11 DIAGNOSIS — Z170 Estrogen receptor positive status [ER+]: Secondary | ICD-10-CM | POA: Diagnosis not present

## 2022-08-11 LAB — RAD ONC ARIA SESSION SUMMARY
Course Elapsed Days: 27
Plan Fractions Treated to Date: 20
Plan Prescribed Dose Per Fraction: 1.8 Gy
Plan Total Fractions Prescribed: 28
Plan Total Prescribed Dose: 50.4 Gy
Reference Point Dosage Given to Date: 36 Gy
Reference Point Session Dosage Given: 1.8 Gy
Session Number: 20

## 2022-08-12 ENCOUNTER — Ambulatory Visit: Payer: BC Managed Care – PPO

## 2022-08-12 ENCOUNTER — Other Ambulatory Visit: Payer: Self-pay

## 2022-08-12 ENCOUNTER — Ambulatory Visit
Admission: RE | Admit: 2022-08-12 | Discharge: 2022-08-12 | Disposition: A | Discharge status: Home / Self Care | Payer: BC Managed Care – PPO | Source: Ambulatory Visit | Attending: Radiation Oncology | Admitting: Radiation Oncology

## 2022-08-12 DIAGNOSIS — Z170 Estrogen receptor positive status [ER+]: Secondary | ICD-10-CM | POA: Diagnosis not present

## 2022-08-12 DIAGNOSIS — Z923 Personal history of irradiation: Secondary | ICD-10-CM | POA: Diagnosis not present

## 2022-08-12 DIAGNOSIS — Z510 Encounter for antineoplastic radiation therapy: Secondary | ICD-10-CM | POA: Diagnosis not present

## 2022-08-12 DIAGNOSIS — C50411 Malignant neoplasm of upper-outer quadrant of right female breast: Secondary | ICD-10-CM | POA: Diagnosis not present

## 2022-08-12 DIAGNOSIS — Z5112 Encounter for antineoplastic immunotherapy: Secondary | ICD-10-CM | POA: Diagnosis not present

## 2022-08-12 DIAGNOSIS — Z79810 Long term (current) use of selective estrogen receptor modulators (SERMs): Secondary | ICD-10-CM | POA: Diagnosis not present

## 2022-08-12 LAB — RAD ONC ARIA SESSION SUMMARY
Course Elapsed Days: 28
Plan Fractions Treated to Date: 21
Plan Prescribed Dose Per Fraction: 1.8 Gy
Plan Total Fractions Prescribed: 28
Plan Total Prescribed Dose: 50.4 Gy
Reference Point Dosage Given to Date: 37.8 Gy
Reference Point Session Dosage Given: 1.8 Gy
Session Number: 21

## 2022-08-12 MED ORDER — RADIAPLEXRX EX GEL: Freq: Once | CUTANEOUS | Status: AC

## 2022-08-13 ENCOUNTER — Ambulatory Visit: Payer: BC Managed Care – PPO

## 2022-08-13 ENCOUNTER — Inpatient Hospital Stay: Payer: BC Managed Care – PPO | Attending: Hematology and Oncology

## 2022-08-13 ENCOUNTER — Ambulatory Visit
Admission: RE | Admit: 2022-08-13 | Discharge: 2022-08-13 | Disposition: A | Discharge status: Home / Self Care | Payer: BC Managed Care – PPO | Source: Ambulatory Visit | Attending: Radiation Oncology | Admitting: Radiation Oncology

## 2022-08-13 ENCOUNTER — Inpatient Hospital Stay: Payer: BC Managed Care – PPO

## 2022-08-13 ENCOUNTER — Other Ambulatory Visit: Payer: Self-pay

## 2022-08-13 ENCOUNTER — Encounter: Payer: Self-pay | Admitting: Hematology and Oncology

## 2022-08-13 VITALS — BP 128/78 | HR 72 | Temp 98.2°F | Resp 18

## 2022-08-13 DIAGNOSIS — Z170 Estrogen receptor positive status [ER+]: Secondary | ICD-10-CM | POA: Insufficient documentation

## 2022-08-13 DIAGNOSIS — Z923 Personal history of irradiation: Secondary | ICD-10-CM | POA: Diagnosis not present

## 2022-08-13 DIAGNOSIS — Z510 Encounter for antineoplastic radiation therapy: Secondary | ICD-10-CM | POA: Diagnosis not present

## 2022-08-13 DIAGNOSIS — C50411 Malignant neoplasm of upper-outer quadrant of right female breast: Secondary | ICD-10-CM | POA: Diagnosis not present

## 2022-08-13 DIAGNOSIS — Z79810 Long term (current) use of selective estrogen receptor modulators (SERMs): Secondary | ICD-10-CM | POA: Diagnosis not present

## 2022-08-13 DIAGNOSIS — Z5112 Encounter for antineoplastic immunotherapy: Secondary | ICD-10-CM | POA: Diagnosis not present

## 2022-08-13 LAB — RAD ONC ARIA SESSION SUMMARY
Course Elapsed Days: 29
Plan Fractions Treated to Date: 22
Plan Prescribed Dose Per Fraction: 1.8 Gy
Plan Total Fractions Prescribed: 28
Plan Total Prescribed Dose: 50.4 Gy
Reference Point Dosage Given to Date: 39.6 Gy
Reference Point Session Dosage Given: 1.8 Gy
Session Number: 22

## 2022-08-13 MED ORDER — LORATADINE 10 MG PO TABS
10.0000 mg | ORAL_TABLET | Freq: Once | ORAL | Status: AC
  Administered 2022-08-13: 10 mg via ORAL
  Filled 2022-08-13: qty 1

## 2022-08-13 MED ORDER — TRASTUZUMAB-DKST CHEMO 150 MG IV SOLR
6.0000 mg/kg | Freq: Once | INTRAVENOUS | Status: AC
  Administered 2022-08-13: 483 mg via INTRAVENOUS
  Filled 2022-08-13: qty 23

## 2022-08-13 MED ORDER — SODIUM CHLORIDE 0.9 % IV SOLN: Freq: Once | INTRAVENOUS | Status: DC

## 2022-08-13 MED ORDER — ACETAMINOPHEN 325 MG PO TABS
650.0000 mg | ORAL_TABLET | Freq: Once | ORAL | Status: AC
  Administered 2022-08-13: 650 mg via ORAL
  Filled 2022-08-13: qty 2

## 2022-08-13 NOTE — Progress Notes (Signed)
Claritin 10 mg po and Acetaminophen 650 mg po added as premedications to treatment plan per patient request.  Acquanetta Belling, RPH, BCPS, BCOP 08/13/2022 10:58 AM

## 2022-08-16 ENCOUNTER — Other Ambulatory Visit: Payer: Self-pay

## 2022-08-16 ENCOUNTER — Ambulatory Visit: Payer: BC Managed Care – PPO

## 2022-08-16 ENCOUNTER — Ambulatory Visit
Admission: RE | Admit: 2022-08-16 | Discharge: 2022-08-16 | Disposition: A | Discharge status: Home / Self Care | Payer: BC Managed Care – PPO | Source: Ambulatory Visit | Attending: Radiation Oncology | Admitting: Radiation Oncology

## 2022-08-16 DIAGNOSIS — Z170 Estrogen receptor positive status [ER+]: Secondary | ICD-10-CM | POA: Diagnosis not present

## 2022-08-16 DIAGNOSIS — Z923 Personal history of irradiation: Secondary | ICD-10-CM | POA: Diagnosis not present

## 2022-08-16 DIAGNOSIS — C50411 Malignant neoplasm of upper-outer quadrant of right female breast: Secondary | ICD-10-CM | POA: Diagnosis not present

## 2022-08-16 DIAGNOSIS — Z5112 Encounter for antineoplastic immunotherapy: Secondary | ICD-10-CM | POA: Diagnosis not present

## 2022-08-16 DIAGNOSIS — Z79810 Long term (current) use of selective estrogen receptor modulators (SERMs): Secondary | ICD-10-CM | POA: Diagnosis not present

## 2022-08-16 DIAGNOSIS — Z510 Encounter for antineoplastic radiation therapy: Secondary | ICD-10-CM | POA: Diagnosis not present

## 2022-08-16 LAB — RAD ONC ARIA SESSION SUMMARY
Course Elapsed Days: 32
Plan Fractions Treated to Date: 23
Plan Prescribed Dose Per Fraction: 1.8 Gy
Plan Total Fractions Prescribed: 28
Plan Total Prescribed Dose: 50.4 Gy
Reference Point Dosage Given to Date: 41.4 Gy
Reference Point Session Dosage Given: 1.8 Gy
Session Number: 23

## 2022-08-17 ENCOUNTER — Ambulatory Visit
Admission: RE | Admit: 2022-08-17 | Discharge: 2022-08-17 | Disposition: A | Discharge status: Home / Self Care | Payer: BC Managed Care – PPO | Source: Ambulatory Visit | Attending: Radiation Oncology | Admitting: Radiation Oncology

## 2022-08-17 ENCOUNTER — Other Ambulatory Visit: Payer: Self-pay

## 2022-08-17 ENCOUNTER — Ambulatory Visit: Payer: BC Managed Care – PPO

## 2022-08-17 DIAGNOSIS — Z923 Personal history of irradiation: Secondary | ICD-10-CM | POA: Diagnosis not present

## 2022-08-17 DIAGNOSIS — Z510 Encounter for antineoplastic radiation therapy: Secondary | ICD-10-CM | POA: Diagnosis not present

## 2022-08-17 DIAGNOSIS — Z79810 Long term (current) use of selective estrogen receptor modulators (SERMs): Secondary | ICD-10-CM | POA: Diagnosis not present

## 2022-08-17 DIAGNOSIS — Z5112 Encounter for antineoplastic immunotherapy: Secondary | ICD-10-CM | POA: Diagnosis not present

## 2022-08-17 DIAGNOSIS — Z170 Estrogen receptor positive status [ER+]: Secondary | ICD-10-CM | POA: Diagnosis not present

## 2022-08-17 DIAGNOSIS — C50411 Malignant neoplasm of upper-outer quadrant of right female breast: Secondary | ICD-10-CM | POA: Diagnosis not present

## 2022-08-17 LAB — RAD ONC ARIA SESSION SUMMARY
Course Elapsed Days: 33
Plan Fractions Treated to Date: 24
Plan Prescribed Dose Per Fraction: 1.8 Gy
Plan Total Fractions Prescribed: 28
Plan Total Prescribed Dose: 50.4 Gy
Reference Point Dosage Given to Date: 43.2 Gy
Reference Point Session Dosage Given: 1.8 Gy
Session Number: 24

## 2022-08-18 ENCOUNTER — Ambulatory Visit
Admission: RE | Admit: 2022-08-18 | Discharge: 2022-08-18 | Disposition: A | Discharge status: Home / Self Care | Payer: BC Managed Care – PPO | Source: Ambulatory Visit | Attending: Radiation Oncology | Admitting: Radiation Oncology

## 2022-08-18 ENCOUNTER — Other Ambulatory Visit: Payer: Self-pay

## 2022-08-18 ENCOUNTER — Ambulatory Visit: Payer: BC Managed Care – PPO

## 2022-08-18 DIAGNOSIS — C50411 Malignant neoplasm of upper-outer quadrant of right female breast: Secondary | ICD-10-CM | POA: Diagnosis not present

## 2022-08-18 DIAGNOSIS — Z79810 Long term (current) use of selective estrogen receptor modulators (SERMs): Secondary | ICD-10-CM | POA: Diagnosis not present

## 2022-08-18 DIAGNOSIS — Z5112 Encounter for antineoplastic immunotherapy: Secondary | ICD-10-CM | POA: Diagnosis not present

## 2022-08-18 DIAGNOSIS — Z923 Personal history of irradiation: Secondary | ICD-10-CM | POA: Diagnosis not present

## 2022-08-18 DIAGNOSIS — Z170 Estrogen receptor positive status [ER+]: Secondary | ICD-10-CM | POA: Diagnosis not present

## 2022-08-18 DIAGNOSIS — Z510 Encounter for antineoplastic radiation therapy: Secondary | ICD-10-CM | POA: Diagnosis not present

## 2022-08-18 LAB — RAD ONC ARIA SESSION SUMMARY
Course Elapsed Days: 34
Plan Fractions Treated to Date: 25
Plan Prescribed Dose Per Fraction: 1.8 Gy
Plan Total Fractions Prescribed: 28
Plan Total Prescribed Dose: 50.4 Gy
Reference Point Dosage Given to Date: 45 Gy
Reference Point Session Dosage Given: 1.8 Gy
Session Number: 25

## 2022-08-19 ENCOUNTER — Other Ambulatory Visit: Payer: Self-pay

## 2022-08-19 ENCOUNTER — Ambulatory Visit
Admission: RE | Admit: 2022-08-19 | Discharge: 2022-08-19 | Disposition: A | Discharge status: Home / Self Care | Payer: BC Managed Care – PPO | Source: Ambulatory Visit | Attending: Radiation Oncology | Admitting: Radiation Oncology

## 2022-08-19 ENCOUNTER — Ambulatory Visit: Payer: BC Managed Care – PPO

## 2022-08-19 DIAGNOSIS — Z170 Estrogen receptor positive status [ER+]: Secondary | ICD-10-CM | POA: Diagnosis not present

## 2022-08-19 DIAGNOSIS — Z5112 Encounter for antineoplastic immunotherapy: Secondary | ICD-10-CM | POA: Diagnosis not present

## 2022-08-19 DIAGNOSIS — Z79810 Long term (current) use of selective estrogen receptor modulators (SERMs): Secondary | ICD-10-CM | POA: Diagnosis not present

## 2022-08-19 DIAGNOSIS — Z510 Encounter for antineoplastic radiation therapy: Secondary | ICD-10-CM | POA: Diagnosis not present

## 2022-08-19 DIAGNOSIS — Z923 Personal history of irradiation: Secondary | ICD-10-CM | POA: Diagnosis not present

## 2022-08-19 DIAGNOSIS — C50411 Malignant neoplasm of upper-outer quadrant of right female breast: Secondary | ICD-10-CM | POA: Diagnosis not present

## 2022-08-19 LAB — RAD ONC ARIA SESSION SUMMARY
Course Elapsed Days: 35
Plan Fractions Treated to Date: 26
Plan Prescribed Dose Per Fraction: 1.8 Gy
Plan Total Fractions Prescribed: 28
Plan Total Prescribed Dose: 50.4 Gy
Reference Point Dosage Given to Date: 46.8 Gy
Reference Point Session Dosage Given: 1.8 Gy
Session Number: 26

## 2022-08-20 ENCOUNTER — Ambulatory Visit: Payer: BC Managed Care – PPO

## 2022-08-20 ENCOUNTER — Other Ambulatory Visit: Payer: Self-pay

## 2022-08-20 ENCOUNTER — Ambulatory Visit
Admission: RE | Admit: 2022-08-20 | Discharge: 2022-08-20 | Disposition: A | Discharge status: Home / Self Care | Payer: BC Managed Care – PPO | Source: Ambulatory Visit | Attending: Radiation Oncology | Admitting: Radiation Oncology

## 2022-08-20 DIAGNOSIS — Z510 Encounter for antineoplastic radiation therapy: Secondary | ICD-10-CM | POA: Diagnosis not present

## 2022-08-20 DIAGNOSIS — Z79810 Long term (current) use of selective estrogen receptor modulators (SERMs): Secondary | ICD-10-CM | POA: Diagnosis not present

## 2022-08-20 DIAGNOSIS — C50411 Malignant neoplasm of upper-outer quadrant of right female breast: Secondary | ICD-10-CM | POA: Diagnosis not present

## 2022-08-20 DIAGNOSIS — Z5112 Encounter for antineoplastic immunotherapy: Secondary | ICD-10-CM | POA: Diagnosis not present

## 2022-08-20 DIAGNOSIS — Z170 Estrogen receptor positive status [ER+]: Secondary | ICD-10-CM | POA: Diagnosis not present

## 2022-08-20 DIAGNOSIS — Z923 Personal history of irradiation: Secondary | ICD-10-CM | POA: Diagnosis not present

## 2022-08-20 LAB — RAD ONC ARIA SESSION SUMMARY
Course Elapsed Days: 36
Plan Fractions Treated to Date: 27
Plan Prescribed Dose Per Fraction: 1.8 Gy
Plan Total Fractions Prescribed: 28
Plan Total Prescribed Dose: 50.4 Gy
Reference Point Dosage Given to Date: 48.6 Gy
Reference Point Session Dosage Given: 1.8 Gy
Session Number: 27

## 2022-08-23 ENCOUNTER — Other Ambulatory Visit: Payer: Self-pay

## 2022-08-23 ENCOUNTER — Ambulatory Visit: Admission: RE | Admit: 2022-08-23 | Payer: BC Managed Care – PPO | Source: Ambulatory Visit

## 2022-08-23 ENCOUNTER — Ambulatory Visit
Admission: RE | Admit: 2022-08-23 | Discharge: 2022-08-23 | Disposition: A | Discharge status: Home / Self Care | Payer: BC Managed Care – PPO | Source: Ambulatory Visit | Attending: Radiation Oncology | Admitting: Radiation Oncology

## 2022-08-23 ENCOUNTER — Ambulatory Visit: Payer: BC Managed Care – PPO

## 2022-08-23 ENCOUNTER — Encounter: Payer: Self-pay | Admitting: Hematology and Oncology

## 2022-08-23 ENCOUNTER — Encounter: Payer: Self-pay | Admitting: Radiation Oncology

## 2022-08-23 DIAGNOSIS — Z5112 Encounter for antineoplastic immunotherapy: Secondary | ICD-10-CM | POA: Diagnosis not present

## 2022-08-23 DIAGNOSIS — Z79810 Long term (current) use of selective estrogen receptor modulators (SERMs): Secondary | ICD-10-CM | POA: Diagnosis not present

## 2022-08-23 DIAGNOSIS — C50411 Malignant neoplasm of upper-outer quadrant of right female breast: Secondary | ICD-10-CM | POA: Diagnosis not present

## 2022-08-23 DIAGNOSIS — Z170 Estrogen receptor positive status [ER+]: Secondary | ICD-10-CM

## 2022-08-23 DIAGNOSIS — Z510 Encounter for antineoplastic radiation therapy: Secondary | ICD-10-CM | POA: Diagnosis not present

## 2022-08-23 DIAGNOSIS — Z923 Personal history of irradiation: Secondary | ICD-10-CM | POA: Diagnosis not present

## 2022-08-23 LAB — RAD ONC ARIA SESSION SUMMARY
Course Elapsed Days: 39
Plan Fractions Treated to Date: 28
Plan Prescribed Dose Per Fraction: 1.8 Gy
Plan Total Fractions Prescribed: 28
Plan Total Prescribed Dose: 50.4 Gy
Reference Point Dosage Given to Date: 50.4 Gy
Reference Point Session Dosage Given: 1.8 Gy
Session Number: 28

## 2022-08-24 ENCOUNTER — Other Ambulatory Visit: Payer: Self-pay | Admitting: *Deleted

## 2022-08-24 ENCOUNTER — Encounter: Payer: Self-pay | Admitting: *Deleted

## 2022-08-24 ENCOUNTER — Ambulatory Visit: Payer: BC Managed Care – PPO

## 2022-08-24 ENCOUNTER — Other Ambulatory Visit: Payer: Self-pay

## 2022-08-24 DIAGNOSIS — C50411 Malignant neoplasm of upper-outer quadrant of right female breast: Secondary | ICD-10-CM

## 2022-08-24 DIAGNOSIS — Z170 Estrogen receptor positive status [ER+]: Secondary | ICD-10-CM

## 2022-08-25 ENCOUNTER — Ambulatory Visit: Payer: BC Managed Care – PPO

## 2022-08-25 ENCOUNTER — Telehealth: Payer: Self-pay | Admitting: Hematology and Oncology

## 2022-08-25 NOTE — Telephone Encounter (Signed)
Rescheduled appointments per 10/16 patient advise request per RN Porsche. Patient is aware of the changes made to her upcoming appointments.

## 2022-08-26 ENCOUNTER — Ambulatory Visit: Payer: BC Managed Care – PPO

## 2022-08-27 ENCOUNTER — Ambulatory Visit: Payer: BC Managed Care – PPO

## 2022-08-27 ENCOUNTER — Inpatient Hospital Stay: Payer: BC Managed Care – PPO

## 2022-08-27 ENCOUNTER — Inpatient Hospital Stay: Payer: BC Managed Care – PPO | Admitting: Hematology and Oncology

## 2022-08-30 ENCOUNTER — Ambulatory Visit: Payer: BC Managed Care – PPO

## 2022-09-01 ENCOUNTER — Ambulatory Visit: Payer: BC Managed Care – PPO | Attending: Radiation Oncology | Admitting: Rehabilitation

## 2022-09-01 ENCOUNTER — Encounter: Payer: Self-pay | Admitting: Rehabilitation

## 2022-09-01 DIAGNOSIS — M6281 Muscle weakness (generalized): Secondary | ICD-10-CM | POA: Diagnosis not present

## 2022-09-01 DIAGNOSIS — R293 Abnormal posture: Secondary | ICD-10-CM | POA: Insufficient documentation

## 2022-09-01 DIAGNOSIS — R601 Generalized edema: Secondary | ICD-10-CM | POA: Insufficient documentation

## 2022-09-01 DIAGNOSIS — M79601 Pain in right arm: Secondary | ICD-10-CM | POA: Diagnosis not present

## 2022-09-01 DIAGNOSIS — Z170 Estrogen receptor positive status [ER+]: Secondary | ICD-10-CM | POA: Insufficient documentation

## 2022-09-01 DIAGNOSIS — C50411 Malignant neoplasm of upper-outer quadrant of right female breast: Secondary | ICD-10-CM | POA: Insufficient documentation

## 2022-09-01 NOTE — Therapy (Signed)
OUTPATIENT PHYSICAL THERAPY BREAST CANCER POST OP FOLLOW UP   Patient Name: Julie Atkinson MRN: [id number] DOB:1971/10/12, 51 y.o., female Today's Date: 09/01/2022   PT End of Session - 09/01/22 1003     Visit Number 1    Number of Visits 6    Date for PT Re-Evaluation 11/24/22    PT Start Time 1423    PT Stop Time 1044    PT Time Calculation (min) 49 min    Activity Tolerance Patient tolerated treatment well    Behavior During Therapy [facility] for tasks assessed/performed             Past Medical History:  Diagnosis Date   Depression    Family history of breast cancer 12/04/2021   Family history of ovarian cancer 12/04/2021   Fibroid 01-12-14   8 mm   History of posttraumatic stress disorder (PTSD) 13-Jan-2008   due to maternal death-MI   PTSD (post-traumatic stress disorder)    mother's death 01/13/08; seizure and AMI while driving, patient performed CPR x 45 minutes   Seropositive for herpes simplex 2 infection 2016/01/13   Past Surgical History:  Procedure Laterality Date   BLADDER SURGERY  1978   nocturnal enuresis treatment   CESAREAN SECTION  01/12/06   Dr Quincy Simmonds   DILATATION & CURETTAGE/HYSTEROSCOPY WITH MYOSURE N/A 01/06/2016   Procedure: DILATATION & CURETTAGE/HYSTEROSCOPY WITH Jacklynn Barnacle;  Surgeon: Nunzio Cobbs, MD;  Location: [facility];  Service: Gynecology;  Laterality: N/A;   DILATION AND CURETTAGE OF UTERUS  1990   MAB?   PORTACATH PLACEMENT Left 12/15/2021   Procedure: INSERTION PORT-A-CATH;  Surgeon: Coralie Keens, MD;  Location: [facility];  Service: General;  Laterality: Left;   Patient Active Problem List   Diagnosis Date Noted   Port-A-Cath in place 12/16/2021   Family history of breast cancer 12/04/2021   Family history of ovarian cancer 12/04/2021   Genetic testing 12/04/2021   Malignant neoplasm of upper-outer quadrant of right female breast ([place]) 12/01/2021   Breast lump on right side at 10 o'clock position 10/21/2021   PTSD (post-traumatic  stress disorder)     PCP: [name], NP  REFERRING PROVIDER: Coralie Keens, MD  REFERRING DIAG: Right breast Cancer   THERAPY DIAG:  Malignant neoplasm of upper-outer quadrant of right breast in female, estrogen receptor positive ([place])  Abnormal posture  Muscle weakness (generalized)  Pain in right arm  Generalized edema  Rationale for Evaluation and Treatment Rehabilitation  ONSET DATE: 11/17/2021  SUBJECTIVE:  SUBJECTIVE STATEMENT: She has had two partial mastectomy. She has noticed the swelling in both her breast but the right side and is worse then the left . She stop the radiation last Monday. Was wearing a compression bra till two weeks of radiation because it was irritating her skin. She has been doing cross fit but has stop due to fear.  PERTINENT HISTORY:  Patient was diagnosed on 11/17/2021 with right grade grade II invasive ductal and lobular carcinoma breast cancer.. It measures 2.4 cm and is located in the upper outer quadrant. It is triple positive with a Ki67 of 15%.    05/24/2022- bilateral breast reduction   PATIENT GOALS:  Reassess how my recovery is going related to arm function, pain, and swelling.  PAIN:  Are you having pain? Yes: NPRS scale: 6/10 Pain location: on right breast and side Pain description: burning  Aggravating factors:   Relieving factors:    PRECAUTIONS: Recent Surgery, right UE Lymphedema risk, None  ACTIVITY LEVEL / LEISURE: She goes to the gym and lifts weights and rides a stationary bike for 7 miles (30 min) several times per week   OBJECTIVE:   PATIENT SURVEYS:  QUICK DASH: 68.18% Breast Complaints: Pain in operated breast- 8 A feeling of heaviness in the operate breast-8 A swollen breast at the operated side-8 The skin feels  tensed at the operated breast-8 Redness of the skin at the operated breast-10 A print of my bra is visible at the operated breast-0 The pores of the skin at the operate breast are enlarged-4  The operated breast feels hard at some place- 8  OBSERVATIONS: Right breast has noticeable swelling with enlarged pores and nipple. The scar is healing as expected. Left breast had some hardening in the lower midline quadrant.   POSTURE:   Forward head and rounded shoulders  LYMPHEDEMA ASSESSMENT:     UPPER EXTREMITY AROM/PROM:   A/PROM Right 12/02/2021 Left 12/02/2021 Right 09/01/2022 Left  09/01/2022  Shoulder extension 47 49 60 60  Shoulder flexion 150 146 180 179  Shoulder abduction 157 160 171 167  Shoulder internal rotation 60 60 65 72  Shoulder external rotation 84 85 90 90                          (Blank rows = not tested)       CERVICAL AROM: All within normal limits   UPPER EXTREMITY STRENGTH: WNL     LYMPHEDEMA ASSESSMENTS:    LANDMARK RIGHT 12/02/2021 LEFT 12/02/2021 Right  09/01/2022 Left  09/01/2022  10 cm proximal to olecranon process 31.1 32.8 30 32  Olecranon process 26.3 26.7 25.8 25.8  10 cm proximal to ulnar styloid process 23.6 22.2 21.2 19.9  Just proximal to ulnar styloid process 16.4 16.3 15.6 16.2  Across hand at thumb web space 19.9 19.3 19.4 19  At base of 2nd digit 6.5 6.3 6 6   (Blank rows = not tested)   Surgery type/Date: Right Lumpectomy and Left benign Lumpectomy:05/05/2022 Number of lymph nodes removed: 2/2  Current/past treatment (chemo, radiation, hormone therapy): radiation, chemo  Other symptoms:  Heaviness/tightness Yes Pain Yes Pitting edema No Infections No  PATIENT EDUCATION:  Education details: Right breast MLD, Exercise after surgery, Lymphedema  Person educated: Patient Education method: Explanation, Demonstration, and Handouts Education comprehension: verbalized understanding and returned demonstration  HOME EXERCISE  PROGRAM: Self MLD for right breast   ASSESSMENT:  CLINICAL IMPRESSION: Pt presented to clinic after post  bilateral lumpectomies. She has completed treatment and has noticed some swelling and tenderness in her breast. Patient has regained her ROM for pre-surgery. Patient had some noticeable swelling in the breast and was educated on how to do the self MLD of the breast. She report some tightness and pulling in her arm but no cord was present. Patient would benefit from skilled therapy to work on the breast swelling and to monitor for lymphedema.  Pt will benefit from skilled therapeutic intervention to improve on the following deficits: Decreased knowledge of precautions, impaired UE functional use, pain, decreased ROM, postural dysfunction.   PT treatment/interventions: ADL/Self care home management, Therapeutic exercises, Therapeutic activity, Neuromuscular re-education, Balance training, Gait training, Patient/Family education, Self Care, Joint mobilization, Dry Needling, Cryotherapy, Moist heat, Manual lymph drainage, scar mobilization, Taping, and Manual therapy   GOALS: Goals reviewed with patient? Yes  LONG TERM GOALS:  (STG=LTG)  GOALS Name Target Date  Goal status  1 Pt will demonstrate she has regained full shoulder ROM and function post operatively compared to baselines.  Baseline: 09/01/2022 MET  2  Pt will report a dash score of 4.55% to an increase in functional ability and reduction of pain. 11/24/2021 INITIAL  3 Pt will report 75% reduction of pain due to improvements in posture, strength, and muscle length  11/24/2021 INITIAL  4 Patient will be Independent with advance HEP 11/24/2021 INITIAL     PLAN:  PT FREQUENCY/DURATION: 1x every other week for 12 weeks   PLAN FOR NEXT SESSION:f/u on self MLD of breast,  breast MLD, SOZO,    [facility]  [address], [address]  [place] [address]  [phone]  After Breast Cancer Class It is  recommended you attend the ABC class to be educated on lymphedema risk reduction. This class is free of charge and lasts for 1 hour. It is a 1-time class. You will need to download the Webex app either on your phone or computer. We will send you a link the night before or the morning of the class. You should be able to click on that link to join the class. This is not a confidential class. You don't have to turn your camera on, but other participants may be able to see your email address.  Scar massage You can begin gentle scar massage to you incision sites. Gently place one hand on the incision and move the skin (without sliding on the skin) in various directions. Do this for a few minutes and then you can gently massage either coconut oil or vitamin E cream into the scars.  Compression garment You should continue wearing your compression bra until you feel like you no longer have swelling.  Home exercise Program Continue doing the exercises you were given until you feel like you can do them without feeling any tightness at the end.   Walking Program Studies show that 30 minutes of walking per day (fast enough to elevate your heart rate) can significantly reduce the risk of a cancer recurrence. If you can't walk due to other medical reasons, we encourage you to find another activity you could do (like a stationary bike or water exercise).  Posture After breast cancer surgery, people frequently sit with rounded shoulders posture because it puts their incisions on slack and feels better. If you sit like this and scar tissue forms in that position, you can become very tight and have pain sitting or standing with good posture. Try to be aware of  your posture and sit and stand up tall to heal properly.  Follow up PT: It is recommended you return every 3 months for the first 3 years following surgery to be assessed on the SOZO machine for an L-Dex score. This helps prevent clinically significant  lymphedema in 95% of patients. These follow up screens are 10 minute appointments that you are not billed for.  Yassin Scales, Student-PT 09/01/2022, 12:05 PM

## 2022-09-02 ENCOUNTER — Other Ambulatory Visit: Payer: Self-pay

## 2022-09-02 NOTE — Progress Notes (Signed)
Patient Care Team: Nicholas Lose, MD as PCP - General (Hematology and Oncology) Rockwell [place], RN as Oncology Nurse Navigator Mauro Kaufmann, RN as Oncology Nurse Navigator Coralie Keens, MD as Consulting Physician (General Surgery) Nicholas Lose, MD as Consulting Physician (Hematology and Oncology) Gery Pray, MD as Consulting Physician (Radiation Oncology)  DIAGNOSIS:  Encounter Diagnosis  Name Primary?   Malignant neoplasm of upper-outer quadrant of right breast in female, estrogen receptor positive ([place]) Yes    SUMMARY OF ONCOLOGIC HISTORY: Oncology History  Malignant neoplasm of upper-outer quadrant of right female breast ([place])  11/19/2021 Initial Diagnosis   Palpable lump for 3 weeks, diagnostic mammogram: 2.2 cm x 2.2 cm irregular mass with a spiculated margin in the upper outer right breast. 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 HER2 positive, Ki-67 15%   12/02/2021 Cancer Staging   Staging form: Breast, AJCC 8th Edition - Clinical stage from 12/02/2021: Stage IB (cT2, cN0, cM0, G2, ER+, PR+, HER2+) - Signed by Nicholas Lose, MD on 12/02/2021 Stage prefix: Initial diagnosis Histologic grading system: 3 grade system   12/16/2021 - 05/12/2022 Chemotherapy   Patient is on Treatment Plan : BREAST  Docetaxel + Carboplatin + Trastuzumab + Pertuzumab  (TCHP) q21d      01/22/2022 Genetic Testing   Negative hereditary cancer genetic testing: no pathogenic variants detected in [place] +RNAinsight Panel.  Report date is 01/22/2022.   The CancerNext gene panel offered by [employer] includes sequencing, rearrangement analysis, and RNA analysis for the following 36 genes:   APC, ATM, AXIN2, BARD1, BMPR1A, BRCA1, BRCA2, BRIP1, CDH1, CDK4, CDKN2A, CHEK2, DICER1, HOXB13, EPCAM, GREM1, MLH1, MSH2, MSH3, MSH6, MUTYH, NBN, NF1, NTHL1, PALB2, PMS2, POLD1, POLE, PTEN, RAD51C, RAD51D, RECQL, SMAD4, SMARCA4, STK11, and TP53.    04/22/2022 -  Chemotherapy   Patient is on  Treatment Plan : BREAST MAINTENANCE Trastuzumab IV (6) or SQ (600) D1 q21d X 11 Cycles     05/05/2022 Surgery   Rt retroareolar lumpectomy and Rt UOQ lumpectomy: No residual cancer, Lt Lumpectomy: Benign Complete Pathological Response   06/04/2022 - 07/02/2022 Chemotherapy   Patient is on Treatment Plan : BREAST Trastuzumab  + Pertuzumab q21d x 13 cycles       CHIEF COMPLIANT: Follow-up right breast cancer herceptin maintenance    INTERVAL HISTORY: Julie Atkinson is a 51 y.o. with above-mentioned history of right breast cancer, currently on chemotherapy with TCHP. She presents to the clinic today for follow-up after radiation and treatment. She reports that the radiation dermatitis is going away. She says she is still sore. She did go to the physical therapy and she is doing good. She does feel more tired.     ALLERGIES:  is allergic to iodinated contrast media, latex, and codeine.  MEDICATIONS:  Current Outpatient Medications  Medication Sig Dispense Refill   [START ON 10/04/2022] tamoxifen (NOLVADEX) 20 MG tablet Take 1 tablet (20 mg total) by mouth daily. 90 tablet 3   acetaminophen (TYLENOL) 500 MG tablet Take 500 mg by mouth every 6 (six) hours as needed.     clindamycin (CLINDAGEL) 1 % gel Apply topically.     Lactobacillus (RENEWAFLOR PO) Take by mouth.     lidocaine-prilocaine (EMLA) cream Apply 1 Application topically as needed. 30 g 1   No current facility-administered medications for this visit.    PHYSICAL EXAMINATION: ECOG PERFORMANCE STATUS: 1 - Symptomatic but completely ambulatory  Vitals:   09/03/22 1004  BP: (!) 152/87  Pulse: 83  Resp: 18  Temp: 97.6 F (36.4 C)  SpO2: 99%   Filed Weights   09/03/22 1004  Weight: 178 lb 14.4 oz (81.1 kg)      LABORATORY DATA:  I have reviewed the data as listed    Latest Ref Rng & Units 09/03/2022    9:47 AM 07/22/2022    8:12 AM 06/04/2022    9:13 AM  CMP  Glucose 70 - 99 mg/dL 89  93  81   BUN 6 - 20 mg/dL  15  9  10    Creatinine 0.44 - 1.00 mg/dL 0.79  0.73  0.71   Sodium 135 - 145 mmol/L 140  136  139   Potassium 3.5 - 5.1 mmol/L 4.2  4.1  4.0   Chloride 98 - 111 mmol/L 107  102  107   CO2 22 - 32 mmol/L 26  27  25    Calcium 8.9 - 10.3 mg/dL 9.2  9.4  8.8   Total Protein 6.5 - 8.1 g/dL 7.5  6.9  7.2   Total Bilirubin 0.3 - 1.2 mg/dL 0.6  0.4  0.4   Alkaline Phos 38 - 126 U/L 62  64  54   AST 15 - 41 U/L 16  19  18    ALT 0 - 44 U/L 15  15  15      Lab Results  Component Value Date   WBC 5.4 09/03/2022   HGB 13.9 09/03/2022   HCT 39.9 09/03/2022   MCV 96.8 09/03/2022   PLT 168 09/03/2022   NEUTROABS 3.8 09/03/2022    ASSESSMENT & PLAN:  Malignant neoplasm of upper-outer quadrant of right female breast ([place]) 11/19/2021:Palpable lump for 3 weeks, diagnostic mammogram: 2.2 cm x 2.2 cm irregular mass with a spiculated margin in the upper outer right breast. 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 HER2 positive, Ki-67 15% MRI Breast 12/09/21:  Right Breast retroareolar mass 6.7 cm (involves nipple areolar complex), Rt axilla 2.1 cm node, Left breast: Several scattered foci left breast indeterminate 0.6 cm mass LIQ left breast and another indeterminate 0.6 cm mass in upper central Left breast   Treatment Plan: 1. Neoadjuvant chemotherapy with TCHP foll by Herceptin maintenance   2. 05/05/22: Bilateral lumpectomies: Path CR 3. Followed by adjuvant radiation therapy 07/16/2022-08/23/2022 4.  Adjuvant antiestrogen therapy with tamoxifen to start 10/04/2022 ------------------------------------------------------------------------------------------------------------------------------------ Current Treatment: Herceptin maintenance Chemo toxicities:  Monitoring closely for toxicities  Echocardiogram 03/17/2022: EF 58%  Tamoxifen counseling: We discussed the risks and benefits of tamoxifen. These include but not limited to insomnia, hot flashes, mood changes, vaginal dryness, and weight gain.  Although rare, serious side effects including endometrial cancer, risk of blood clots were also discussed. We strongly believe that the benefits far outweigh the risks. Patient understands these risks and consented to starting treatment. Planned treatment duration is 5-10 years.  We will arrange for her to have survivorship care plan visit in 3 weeks with Mendel Ryder.  Mild back and abdominal pains: Will obtain CT chest abdomen pelvis without contrast.  (She has an allergy to contrast)  RTC in 3 weeks for Herceptin maintenance and every 6 weeks for follow-up with me.      Orders Placed This Encounter  Procedures   CT CHEST ABDOMEN PELVIS WO CONTRAST    Standing Status:   Future    Standing Expiration Date:   09/04/2023    Order Specific Question:   Is patient pregnant?    Answer:   No  Order Specific Question:   Preferred imaging location?    Answer:   [facility]    Order Specific Question:   Release to patient    Answer:   Immediate   The patient has a good understanding of the overall plan. she agrees with it. she will call with any problems that may develop before the next visit here. Total time spent: 30 mins including face to face time and time spent for planning, charting and co-ordination of care   Julie Ohara, MD 09/03/22    I Gardiner Coins am scribing for Dr. Lindi Adie  I have reviewed the above documentation for accuracy and completeness, and I agree with the above.

## 2022-09-03 ENCOUNTER — Inpatient Hospital Stay: Payer: BC Managed Care – PPO

## 2022-09-03 ENCOUNTER — Inpatient Hospital Stay (HOSPITAL_BASED_OUTPATIENT_CLINIC_OR_DEPARTMENT_OTHER): Payer: BC Managed Care – PPO | Admitting: Hematology and Oncology

## 2022-09-03 ENCOUNTER — Inpatient Hospital Stay: Payer: BC Managed Care – PPO | Admitting: Hematology and Oncology

## 2022-09-03 ENCOUNTER — Other Ambulatory Visit: Payer: Self-pay | Admitting: *Deleted

## 2022-09-03 VITALS — BP 152/87 | HR 83 | Temp 97.6°F | Resp 18 | Ht 67.0 in | Wt 178.9 lb

## 2022-09-03 DIAGNOSIS — C50411 Malignant neoplasm of upper-outer quadrant of right female breast: Secondary | ICD-10-CM

## 2022-09-03 DIAGNOSIS — Z170 Estrogen receptor positive status [ER+]: Secondary | ICD-10-CM

## 2022-09-03 DIAGNOSIS — Z5112 Encounter for antineoplastic immunotherapy: Secondary | ICD-10-CM | POA: Diagnosis not present

## 2022-09-03 DIAGNOSIS — Z95828 Presence of other vascular implants and grafts: Secondary | ICD-10-CM

## 2022-09-03 DIAGNOSIS — Z510 Encounter for antineoplastic radiation therapy: Secondary | ICD-10-CM | POA: Diagnosis not present

## 2022-09-03 DIAGNOSIS — Z79810 Long term (current) use of selective estrogen receptor modulators (SERMs): Secondary | ICD-10-CM | POA: Diagnosis not present

## 2022-09-03 DIAGNOSIS — Z923 Personal history of irradiation: Secondary | ICD-10-CM | POA: Diagnosis not present

## 2022-09-03 LAB — CMP (CANCER CENTER ONLY)
ALT: 15 U/L (ref 0–44)
AST: 16 U/L (ref 15–41)
Albumin: 4.3 g/dL (ref 3.5–5.0)
Alkaline Phosphatase: 62 U/L (ref 38–126)
Anion gap: 7 (ref 5–15)
BUN: 15 mg/dL (ref 6–20)
CO2: 26 mmol/L (ref 22–32)
Calcium: 9.2 mg/dL (ref 8.9–10.3)
Chloride: 107 mmol/L (ref 98–111)
Creatinine: 0.79 mg/dL (ref 0.44–1.00)
GFR, Estimated: >60 mL/min (ref >60)
Glucose, Bld: 89 mg/dL (ref 70–99)
Potassium: 4.2 mmol/L (ref 3.5–5.1)
Sodium: 140 mmol/L (ref 135–145)
Total Bilirubin: 0.6 mg/dL (ref 0.3–1.2)
Total Protein: 7.5 g/dL (ref 6.5–8.1)

## 2022-09-03 LAB — CBC WITH DIFFERENTIAL (CANCER CENTER ONLY)
Abs Immature Granulocytes: 0.02 10*3/uL (ref 0.00–0.07)
Basophils Absolute: 0 10*3/uL (ref 0.0–0.1)
Basophils Relative: 1 %
Eosinophils Absolute: 0.1 10*3/uL (ref 0.0–0.5)
Eosinophils Relative: 3 %
HCT: 39.9 % (ref 36.0–46.0)
Hemoglobin: 13.9 g/dL (ref 12.0–15.0)
Immature Granulocytes: 0 %
Lymphocytes Relative: 19 %
Lymphs Abs: 1 10*3/uL (ref 0.7–4.0)
MCH: 33.7 pg (ref 26.0–34.0)
MCHC: 34.8 g/dL (ref 30.0–36.0)
MCV: 96.8 fL (ref 80.0–100.0)
Monocytes Absolute: 0.4 10*3/uL (ref 0.1–1.0)
Monocytes Relative: 7 %
Neutro Abs: 3.8 10*3/uL (ref 1.7–7.7)
Neutrophils Relative %: 70 %
Platelet Count: 168 10*3/uL (ref 150–400)
RBC: 4.12 MIL/uL (ref 3.87–5.11)
RDW: 12.7 % (ref 11.5–15.5)
WBC Count: 5.4 10*3/uL (ref 4.0–10.5)
nRBC: 0 % (ref 0.0–0.2)

## 2022-09-03 MED ORDER — SODIUM CHLORIDE 0.9 % IV SOLN: Freq: Once | INTRAVENOUS | Status: AC

## 2022-09-03 MED ORDER — TAMOXIFEN CITRATE 20 MG PO TABS: 20.0000 mg | ORAL_TABLET | Freq: Every day | ORAL | 3 refills | Status: DC

## 2022-09-03 MED ORDER — LORATADINE 10 MG PO TABS
10.0000 mg | ORAL_TABLET | Freq: Once | ORAL | Status: AC
  Administered 2022-09-03: 10 mg via ORAL
  Filled 2022-09-03: qty 1

## 2022-09-03 MED ORDER — SODIUM CHLORIDE 0.9% FLUSH
10.0000 mL | Freq: Once | INTRAVENOUS | Status: AC
  Administered 2022-09-03: 10 mL

## 2022-09-03 MED ORDER — ACETAMINOPHEN 325 MG PO TABS
650.0000 mg | ORAL_TABLET | Freq: Once | ORAL | Status: AC
  Administered 2022-09-03: 650 mg via ORAL
  Filled 2022-09-03: qty 2

## 2022-09-03 MED ORDER — TRASTUZUMAB-DKST CHEMO 150 MG IV SOLR
6.0000 mg/kg | Freq: Once | INTRAVENOUS | Status: AC
  Administered 2022-09-03: 483 mg via INTRAVENOUS
  Filled 2022-09-03: qty 23

## 2022-09-03 NOTE — Assessment & Plan Note (Addendum)
11/19/2021:Palpable lump for 3 weeks, diagnostic mammogram: 2.2 cm x 2.2 cm irregular mass with a spiculated margin in the upper outer right breast. 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 HER2 positive, Ki-67 15% MRI Breast 12/09/21: Right Breast retroareolar mass 6.7 cm (involves nipple areolar complex), Rt axilla 2.1 cm node, Left breast: Several scattered foci left breast indeterminate 0.6 cm mass LIQ left breast and another indeterminate 0.6 cm mass in upper central Left breast  Treatment Plan: 1. Neoadjuvant chemotherapy withTCHP foll by Herceptin maintenance   2. 05/05/22: Bilateral lumpectomies: Path CR 3. Followed by adjuvant radiation therapy 07/16/2022-08/23/2022 4.Adjuvant antiestrogen therapy ------------------------------------------------------------------------------------------------------------------------------------ Current Treatment:Herceptin maintenance Chemo toxicities: Monitoring closely for toxicities  Echocardiogram 03/17/2022: EF 58%  Radiation related fatigue and right surgical scar tenderness and dermatitis  RTCin 3 weeks for Herceptin maintenanceand every 6 weeks for follow-up with me.

## 2022-09-03 NOTE — Patient Instructions (Signed)
[facility] MEDICAL [facility]COLOGY  Discharge Instructions: Thank you for choosing [facility] to provide your oncology and hematology care.   If you have a lab appointment with the Cancer Center, please go directly to the Cancer Center and check in at the registration area.   Wear comfortable clothing and clothing appropriate for easy access to any Portacath or PICC line.   We strive to give you quality time with your provider. You may need to reschedule your appointment if you arrive late (15 or more minutes).  Arriving late affects you and other patients whose appointments are after yours.  Also, if you miss three or more appointments without notifying the office, you may be dismissed from the clinic at the provider's discretion.      For prescription refill requests, have your pharmacy contact our office and allow 72 hours for refills to be completed.    Today you received the following chemotherapy and/or immunotherapy agents herceptin      To help prevent nausea and vomiting after your treatment, we encourage you to take your nausea medication as directed.  BELOW ARE SYMPTOMS THAT SHOULD BE REPORTED IMMEDIATELY: *FEVER GREATER THAN 100.4 F (38 C) OR HIGHER *CHILLS OR SWEATING *NAUSEA AND VOMITING THAT IS NOT CONTROLLED WITH YOUR NAUSEA MEDICATION *UNUSUAL SHORTNESS OF BREATH *UNUSUAL BRUISING OR BLEEDING *URINARY PROBLEMS (pain or burning when urinating, or frequent urination) *BOWEL PROBLEMS (unusual diarrhea, constipation, pain near the anus) TENDERNESS IN MOUTH AND THROAT WITH OR WITHOUT PRESENCE OF ULCERS (sore throat, sores in mouth, or a toothache) UNUSUAL RASH, SWELLING OR PAIN  UNUSUAL VAGINAL DISCHARGE OR ITCHING   Items with * indicate a potential emergency and should be followed up as soon as possible or go to the Emergency Department if any problems should occur.  Please show the CHEMOTHERAPY ALERT CARD or IMMUNOTHERAPY ALERT CARD at check-in to  the Emergency Department and triage nurse.  Should you have questions after your visit or need to cancel or reschedule your appointment, please contact [facility]COL[facility]  Dept: [phone]  and follow the prompts.  Office hours are 8:00 a.m. to 4:30 p.m. Monday - Friday. Please note that voicemails left after 4:00 p.m. may not be returned until the following business day.  We are closed weekends and major holidays. You have access to a nurse at all times for urgent questions. Please call the main number to the clinic Dept: [phone] and follow the prompts.   For any non-urgent questions, you may also contact your provider using MyChart. We now offer e-Visits for anyone 18 and older to request care online for non-urgent symptoms. For details visit [email or web address].   Also download the MyChart app! Go to the app store, search "MyChart", open the app, select , and log in with your MyChart username and password.  Masks are optional in the cancer centers. If you would like for your care team to wear a mask while they are taking care of you, please let them know. You may have one support person who is at least 52 years old accompany you for your appointments. 

## 2022-09-06 ENCOUNTER — Telehealth: Payer: Self-pay | Admitting: Hematology and Oncology

## 2022-09-06 NOTE — Telephone Encounter (Signed)
Scheduled appointment per 10/27 los. Left voicemail.

## 2022-09-07 DIAGNOSIS — C50411 Malignant neoplasm of upper-outer quadrant of right female breast: Secondary | ICD-10-CM | POA: Diagnosis not present

## 2022-09-09 DIAGNOSIS — C50912 Malignant neoplasm of unspecified site of left female breast: Secondary | ICD-10-CM | POA: Diagnosis not present

## 2022-09-13 ENCOUNTER — Ambulatory Visit: Payer: BC Managed Care – PPO | Attending: Radiation Oncology | Admitting: Rehabilitation

## 2022-09-13 DIAGNOSIS — R601 Generalized edema: Secondary | ICD-10-CM

## 2022-09-13 DIAGNOSIS — R293 Abnormal posture: Secondary | ICD-10-CM

## 2022-09-13 DIAGNOSIS — M6281 Muscle weakness (generalized): Secondary | ICD-10-CM

## 2022-09-13 DIAGNOSIS — Z170 Estrogen receptor positive status [ER+]: Secondary | ICD-10-CM | POA: Insufficient documentation

## 2022-09-13 DIAGNOSIS — M79601 Pain in right arm: Secondary | ICD-10-CM | POA: Diagnosis not present

## 2022-09-13 DIAGNOSIS — C50411 Malignant neoplasm of upper-outer quadrant of right female breast: Secondary | ICD-10-CM | POA: Diagnosis not present

## 2022-09-13 NOTE — Therapy (Addendum)
OUTPATIENT PHYSICAL THERAPY BREAST CANCER POST OP FOLLOW UP   Patient Name: Julie Atkinson MRN: [id number] DOB:January 25, 1971, 51 y.o., female Today's Date: 09/13/2022   PT End of Session - 09/13/22 0957     Visit Number 2    Number of Visits 6    Date for PT Re-Evaluation 11/24/22    PT Start Time 0957    PT Stop Time 8588    PT Time Calculation (min) 59 min    Activity Tolerance Patient tolerated treatment well    Behavior During Therapy [facility] for tasks assessed/performed              Past Medical History:  Diagnosis Date   Depression    Family history of breast cancer 12/04/2021   Family history of ovarian cancer 12/04/2021   Fibroid 12/27/2013   8 mm   History of posttraumatic stress disorder (PTSD) December 28, 2007   due to maternal death-MI   PTSD (post-traumatic stress disorder)    mother's death 28-Dec-2007; seizure and AMI while driving, patient performed CPR x 45 minutes   Seropositive for herpes simplex 2 infection Dec 28, 2015   Past Surgical History:  Procedure Laterality Date   BLADDER SURGERY  1978   nocturnal enuresis treatment   CESAREAN SECTION  12-27-05   Dr Quincy Simmonds   DILATATION & CURETTAGE/HYSTEROSCOPY WITH MYOSURE N/A 01/06/2016   Procedure: DILATATION & CURETTAGE/HYSTEROSCOPY WITH Jacklynn Barnacle;  Surgeon: Nunzio Cobbs, MD;  Location: [facility];  Service: Gynecology;  Laterality: N/A;   DILATION AND CURETTAGE OF UTERUS  1990   MAB?   PORTACATH PLACEMENT Left 12/15/2021   Procedure: INSERTION PORT-A-CATH;  Surgeon: Coralie Keens, MD;  Location: [place] [facility];  Service: General;  Laterality: Left;   Patient Active Problem List   Diagnosis Date Noted   Port-A-Cath in place 12/16/2021   Family history of breast cancer 12/04/2021   Family history of ovarian cancer 12/04/2021   Genetic testing 12/04/2021   Malignant neoplasm of upper-outer quadrant of right female breast ([place]) 12/01/2021   Breast lump on right side at 10 o'clock position 10/21/2021   PTSD (post-traumatic  stress disorder)     PCP: [name], NP  REFERRING PROVIDER: Coralie Keens, MD  REFERRING DIAG: Right breast Cancer   THERAPY DIAG:  Malignant neoplasm of upper-outer quadrant of right breast in female, estrogen receptor positive ([place])  Abnormal posture  Generalized edema  Pain in right arm  Muscle weakness (generalized)  Rationale for Evaluation and Treatment Rehabilitation  ONSET DATE: 11/17/2021  SUBJECTIVE:  SUBJECTIVE STATEMENT: She got fitted for her compression bra. She feels she has the most swelling in her armpit.  PERTINENT HISTORY:  Patient was diagnosed on 11/17/2021 with right grade grade II invasive ductal and lobular carcinoma breast cancer.. It measures 2.4 cm and is located in the upper outer quadrant. It is triple positive with a Ki67 of 15%.    05/24/2022- bilateral breast reduction   PATIENT GOALS:  Reassess how my recovery is going related to arm function, pain, and swelling.  PAIN:  Evaluation: Are you having pain? Yes: NPRS scale: 6/10 Pain location: on right breast and side Pain description: burning  Aggravating factors:   Relieving factors:    PRECAUTIONS: Recent Surgery, right UE Lymphedema risk, None  ACTIVITY LEVEL / LEISURE: She goes to the gym and lifts weights and rides a stationary bike for 7 miles (30 min) several times per week   OBJECTIVE:   PATIENT SURVEYS:  QUICK DASH: 68.18% Breast Complaints: Pain in operated breast- 8 A feeling of heaviness in the operate breast-8 A swollen breast at the operated side-8 The skin feels tensed at the operated breast-8 Redness of the skin at the operated breast-10 A print of my bra is visible at the operated breast-0 The pores of the skin at the operate breast are enlarged-4  The operated breast  feels hard at some place- 8  OBSERVATIONS: Right breast has noticeable swelling with enlarged pores and nipple. The scar is healing as expected. Left breast had some hardening in the lower midline quadrant.   POSTURE:   Forward head and rounded shoulders  LYMPHEDEMA ASSESSMENT:     UPPER EXTREMITY AROM/PROM:   A/PROM Right 12/02/2021 Left 12/02/2021 Right 09/01/2022 Left  09/01/2022  Shoulder extension 47 49 60 60  Shoulder flexion 150 146 180 179  Shoulder abduction 157 160 171 167  Shoulder internal rotation 60 60 65 72  Shoulder external rotation 84 85 90 90                          (Blank rows = not tested)       CERVICAL AROM: All within normal limits   UPPER EXTREMITY STRENGTH: WNL     LYMPHEDEMA ASSESSMENTS:    LANDMARK RIGHT 12/02/2021 LEFT 12/02/2021 Right  09/01/2022 Left  09/01/2022  10 cm proximal to olecranon process 31.1 32.8 30 32  Olecranon process 26.3 26.7 25.8 25.8  10 cm proximal to ulnar styloid process 23.6 22.2 21.2 19.9  Just proximal to ulnar styloid process 16.4 16.3 15.6 16.2  Across hand at thumb web space 19.9 19.3 19.4 19  At base of 2nd digit 6.5 6._0 (Blank rows = not tested)   L-dex assessment  The patient was assessed using the L-Dex machine today to produce a lymphedema index baseline score. The patient will be reassessed on a regular basis (typically every 3 months) to obtain new L-Dex scores. If the score is > 6.5 points away from his/her baseline score indicating onset of subclinical lymphedema, it will be recommended to wear a compression garment for 4 weeks, 12 hours per day and then be reassessed. If the score continues to be > 6.5 points from baseline at reassessment, we will initiate lymphedema treatment. Assessing in this manner has a 95% rate of preventing clinically significant lymphedema.   L-DEX FLOWSHEETS - 09/13/22 1000       L-DEX LYMPHEDEMA SCREENING   Measurement Type Unilateral    L-DEX  MEASUREMENT EXTREMITY  Upper Extremity    POSITION  Standing    DOMINANT SIDE Right    At Risk Side Right    BASELINE SCORE (UNILATERAL) -1.9    L-DEX SCORE (UNILATERAL) 0.1    VALUE CHANGE (UNILAT) 2              Surgery type/Date: Right Lumpectomy and Left benign Lumpectomy:05/05/2022 Number of lymph nodes removed: 2/2  Current/past treatment (chemo, radiation, hormone therapy): radiation, chemo  Other symptoms:  Heaviness/tightness Yes Pain Yes Pitting edema No Infections No   PATIENT EDUCATION:  Education details: Right breast MLD, Exercise after surgery, Lymphedema  Person educated: Patient Education method: Explanation, Demonstration, and Handouts Education comprehension: verbalized understanding and returned demonstration  HOME EXERCISE PROGRAM: Self MLD for right breast   TODAY'S TREATMENT:  09/13/2022: SOZO screening In supine: Short neck, 5 diaphragmatic breaths, L axillary nodes and establishment of interaxillary pathway, R inguinal nodes and establishment of axilloinguinal pathway, then R breast moving fluid towards pathways spending extra time in any areas of fibrosis then retracing all steps.   ASSESSMENT:  CLINICAL IMPRESSION: Patient had some noticeable swelling in the breast and was educated on continuing to do her self breast MLD. Patient was educated when she receives her compression bra to bring it in to the session and we can add foam if needed. Patient had no detectable swelling in arm from he SOZO screening. Patient would benefit from skilled therapy to work on the breast swelling and to monitor for lymphedema.  Pt will benefit from skilled therapeutic intervention to improve on the following deficits: Decreased knowledge of precautions, impaired UE functional use, pain, decreased ROM, postural dysfunction.   PT treatment/interventions: ADL/Self care home management, Therapeutic exercises, Therapeutic activity, Neuromuscular re-education, Balance training, Gait training,  Patient/Family education, Self Care, Joint mobilization, Dry Needling, Cryotherapy, Moist heat, Manual lymph drainage, scar mobilization, Taping, and Manual therapy   GOALS: Goals reviewed with patient? Yes  LONG TERM GOALS:  (STG=LTG)  GOALS Name Target Date  Goal status  1 Pt will demonstrate she has regained full shoulder ROM and function post operatively compared to baselines.  Baseline: 09/01/2022 MET  2  Pt will report a dash score of 4.55% to an increase in functional ability and reduction of pain. 11/24/2021 INITIAL  3 Pt will report 75% reduction of pain due to improvements in posture, strength, and muscle length  11/24/2021 INITIAL  4 Patient will be Independent with advance HEP 11/24/2021 INITIAL     PLAN:  PT FREQUENCY/DURATION: 1x every other week for 12 weeks   PLAN FOR NEXT SESSION:f/u on self MLD of breast,  breast MLD, SOZO,    [facility]  [address], [address]  [place] [address]  [phone]  After Breast Cancer Class It is recommended you attend the ABC class to be educated on lymphedema risk reduction. This class is free of charge and lasts for 1 hour. It is a 1-time class. You will need to download the Webex app either on your phone or computer. We will send you a link the night before or the morning of the class. You should be able to click on that link to join the class. This is not a confidential class. You don't have to turn your camera on, but other participants may be able to see your email address.  Scar massage You can begin gentle scar massage to you incision sites. Gently place one hand on the incision and move the  skin (without sliding on the skin) in various directions. Do this for a few minutes and then you can gently massage either coconut oil or vitamin E cream into the scars.  Compression garment You should continue wearing your compression bra until you feel like you no longer have swelling.  Home exercise  Program Continue doing the exercises you were given until you feel like you can do them without feeling any tightness at the end.   Walking Program Studies show that 30 minutes of walking per day (fast enough to elevate your heart rate) can significantly reduce the risk of a cancer recurrence. If you can't walk due to other medical reasons, we encourage you to find another activity you could do (like a stationary bike or water exercise).  Posture After breast cancer surgery, people frequently sit with rounded shoulders posture because it puts their incisions on slack and feels better. If you sit like this and scar tissue forms in that position, you can become very tight and have pain sitting or standing with good posture. Try to be aware of your posture and sit and stand up tall to heal properly.  Follow up PT: It is recommended you return every 3 months for the first 3 years following surgery to be assessed on the SOZO machine for an L-Dex score. This helps prevent clinically significant lymphedema in 95% of patients. These follow up screens are 10 minute appointments that you are not billed for.   Rosario Jacks, Student-PT 09/13/2022  10:59 AM

## 2022-09-15 ENCOUNTER — Other Ambulatory Visit: Payer: Self-pay | Admitting: *Deleted

## 2022-09-15 ENCOUNTER — Ambulatory Visit (HOSPITAL_COMMUNITY)
Admission: RE | Admit: 2022-09-15 | Discharge: 2022-09-15 | Disposition: A | Discharge status: Home / Self Care | Payer: BC Managed Care – PPO | Source: Ambulatory Visit | Attending: Hematology and Oncology | Admitting: Hematology and Oncology

## 2022-09-15 ENCOUNTER — Encounter: Payer: Self-pay | Admitting: Hematology and Oncology

## 2022-09-15 ENCOUNTER — Telehealth: Payer: Self-pay

## 2022-09-15 DIAGNOSIS — C50919 Malignant neoplasm of unspecified site of unspecified female breast: Secondary | ICD-10-CM | POA: Insufficient documentation

## 2022-09-15 DIAGNOSIS — Z0189 Encounter for other specified special examinations: Secondary | ICD-10-CM | POA: Diagnosis not present

## 2022-09-15 DIAGNOSIS — I351 Nonrheumatic aortic (valve) insufficiency: Secondary | ICD-10-CM | POA: Insufficient documentation

## 2022-09-15 DIAGNOSIS — C50411 Malignant neoplasm of upper-outer quadrant of right female breast: Secondary | ICD-10-CM | POA: Diagnosis not present

## 2022-09-15 DIAGNOSIS — Z170 Estrogen receptor positive status [ER+]: Secondary | ICD-10-CM | POA: Diagnosis not present

## 2022-09-15 DIAGNOSIS — Z09 Encounter for follow-up examination after completed treatment for conditions other than malignant neoplasm: Secondary | ICD-10-CM | POA: Insufficient documentation

## 2022-09-15 LAB — ECHOCARDIOGRAM COMPLETE
Area-P 1/2: 3.97 cm2
S' Lateral: 3.3 cm

## 2022-09-15 NOTE — Progress Notes (Signed)
Received call from pt with complaint of HR 171-210 post echocardiogram.  Pt states this episode lasted 20 minutes and felt lightheaded and anxious.  Per MD pt needing to go to ED for further evaluation.  Pt states she does not wish to go to ED at this time, current HR 120 and she is feeling better.  Per MD pt needing to continue to monitor HR and BP.  If HR becomes elevated again, pt needing to be seen in ED.  Verbal orders also received from MD for pt to be referred to Dr. Harl Bowie with Cardiology for evaluation of tachycardia episode while on Trastuzumab.  Orders placed. Pt educated and verbalized understanding.

## 2022-09-15 NOTE — Telephone Encounter (Signed)
Attempted to call pt in regards to [employer], as MD states he would recommend the pt keep an eye on her pulse, and it it continues to fluctuate or does not improve, she should go to ED. Consult entered for pt to evaluated by cardiology as she is on trastuzumab. Advised pt to call us back via VM.

## 2022-09-15 NOTE — Progress Notes (Signed)
  Echocardiogram 2D Echocardiogram has been performed.  Julie Atkinson M 09/15/2022, 10:44 AM

## 2022-09-17 ENCOUNTER — Inpatient Hospital Stay: Payer: BC Managed Care – PPO

## 2022-09-17 ENCOUNTER — Encounter: Payer: Self-pay | Admitting: Hematology and Oncology

## 2022-09-17 ENCOUNTER — Telehealth: Payer: Self-pay | Admitting: *Deleted

## 2022-09-17 NOTE — Telephone Encounter (Signed)
Called patient to inform of fu appt. being moved to 9:30 am on 09-23-22, spoke with patient and she is aware of this appt. and is good with it

## 2022-09-20 ENCOUNTER — Encounter: Payer: Self-pay | Admitting: Radiation Oncology

## 2022-09-20 ENCOUNTER — Encounter: Payer: Self-pay | Admitting: Internal Medicine

## 2022-09-20 ENCOUNTER — Ambulatory Visit: Payer: BC Managed Care – PPO | Attending: Internal Medicine

## 2022-09-20 ENCOUNTER — Ambulatory Visit: Payer: BC Managed Care – PPO | Attending: Internal Medicine | Admitting: Internal Medicine

## 2022-09-20 VITALS — BP 118/70 | HR 65 | Ht 68.0 in | Wt 182.4 lb

## 2022-09-20 DIAGNOSIS — Z79899 Other long term (current) drug therapy: Secondary | ICD-10-CM | POA: Diagnosis not present

## 2022-09-20 DIAGNOSIS — R002 Palpitations: Secondary | ICD-10-CM | POA: Diagnosis not present

## 2022-09-20 DIAGNOSIS — Z5181 Encounter for therapeutic drug level monitoring: Secondary | ICD-10-CM | POA: Diagnosis not present

## 2022-09-20 MED ORDER — DILTIAZEM HCL 30 MG PO TABS: 30.0000 mg | ORAL_TABLET | Freq: Four times a day (QID) | ORAL | 3 refills | Status: DC | PRN

## 2022-09-20 NOTE — Progress Notes (Signed)
Cardiology Office Note:    Date:  09/20/2022   ID:  GLORIE DOWLEN, DOB [birth year], MRN [id number]  PCP:  Nicholas Lose, MD   [place] Providers Cardiologist:  Janina Mayo, MD     Referring MD: Nicholas Lose, MD   No chief complaint on file. Cardio-Onc; Anti-Her2 therapy  History of Present Illness:    KARYSA HEFT is a 51 y.o. female with a hx of PTSD, Rt breast cancer s/p on TCHP, s/p lumpectomy and adjuvant RT, normal GLS/EF with TTE surveillance for [facility] referral for cardio-oncology clinic  Notes HR went up to 210 BPM SVT on Apple watch.  Notes in March went to 186 after standing.  She notes getting a tea at [employer]. Afterwards she notes significant palpitations. She sat and, her heart rates continued to be elevated. Not correlated with cancer therapy.   Blood pressure is normal.  She denies angina, dyspnea on exertion, lower extremity edema, PND or orthopnea.   Family Hx: mother had a fatal PE. Father - no heart dx. No siblings  Social Hx: quit smoking 10 years ago, ~ 20 years off and on. [place]. In the past did cardio and crossfit. She had significant leg pain with chemo. She walks consistently.  Cardiology Studies: TTE 09/15/2022-EF 55%, -19.6%, RV nl, no valve dx 06/11/2022- EF 60-65%, GLS -19.3%,  RV is nl, no significant valve dx 03/16/2022- EF 55-60%, nl RV fxn, no significant valve dx 12/11/2021- EF 60-65%, GLS -26.5%, nl RV fxn, no sig valve disease  Onc Hx + R breast mass ER/PR+, HER2+ 12/16/2021- 03/31/2022; maintenance still with herceptin , last dose 09/03/2022 until 12-24-22 Docetaxel+Carboplatin+Trastuzumab+Pertuzmab S/p BL lumpectomy 05/05/2022 (L luumpectomy tissue benign) Adjuvant radiation therapy 07/16/2022-08/23/2022 RT Adjuvant antiestrogen therapy to start 10/04/2022 Planned for survivorship care with Wilber Bihari Followed by Dr. Lindi Adie  Past Medical History:  Diagnosis Date   Depression    Family history of breast cancer  12/04/2021   Family history of ovarian cancer 12/04/2021   Fibroid 12/24/2013   8 mm   History of posttraumatic stress disorder (PTSD) 25-Dec-2007   due to maternal death-MI   History of radiation therapy    Right breast- 07/15/22-08/23/22- Dr. Gery Pray   PTSD (post-traumatic stress disorder)    mother's death 12/25/07; seizure and AMI while driving, patient performed CPR x 45 minutes   Seropositive for herpes simplex 2 infection 2015/12/25    Past Surgical History:  Procedure Laterality Date   BLADDER SURGERY  1978   nocturnal enuresis treatment   CESAREAN SECTION  Dec 24, 2005   Dr Quincy Simmonds   DILATATION & CURETTAGE/HYSTEROSCOPY WITH MYOSURE N/A 01/06/2016   Procedure: DILATATION & CURETTAGE/HYSTEROSCOPY WITH MYOSURE;  Surgeon: Nunzio Cobbs, MD;  Location: [facility];  Service: Gynecology;  Laterality: N/A;   DILATION AND CURETTAGE OF UTERUS  1990   MAB?   PORTACATH PLACEMENT Left 12/15/2021   Procedure: INSERTION PORT-A-CATH;  Surgeon: Coralie Keens, MD;  Location: [facility];  Service: General;  Laterality: Left;    Current Medications: Current Outpatient Medications on File Prior to Visit  Medication Sig Dispense Refill   acetaminophen (TYLENOL) 500 MG tablet Take 500 mg by mouth every 6 (six) hours as needed.     clindamycin (CLINDAGEL) 1 % gel Apply topically.     lidocaine-prilocaine (EMLA) cream Apply 1 Application topically as needed. 30 g 1   [START ON 10/04/2022] tamoxifen (NOLVADEX) 20 MG tablet Take 1 tablet (20 mg  total) by mouth daily. 90 tablet 3   No current facility-administered medications on file prior to visit.     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   Lives with her son.   Lost her job suddenly 08/20/2016 with job cuts at work.   Social Determinants of Health   Financial Resource Strain: Not on file  Food Insecurity: Not on file  Transportation Needs: Not on file  Physical Activity: Not on file  Stress: Not on file  Social Connections: Not on file     Family History: The patient's family history includes Bone cancer in her paternal grandmother; Breast cancer in her maternal aunt; Breast cancer (age of onset: 81) in her cousin; Cancer in an other family member; Cervical cancer (age of onset: 20) in her mother; Heart disease in her mother; Lung cancer in her maternal uncle; Ovarian cancer in her cousin; Pulmonary disease in her mother.  ROS:   Please see the history of present illness.     All other systems reviewed and are negative.  EKGs/Labs/Other Studies Reviewed:    The following studies were reviewed today:   EKG:  EKG is  ordered today.  The ekg ordered today demonstrates   09/20/2022- NSR, poor r wave in the septal leads  Recent Labs: 10/08/2021: TSH 2.90 09/03/2022: ALT 15; BUN 15; Creatinine 0.79; Hemoglobin 13.9; Platelet Count 168; Potassium 4.2; Sodium 140   Recent Lipid Panel    Component Value Date/Time   CHOL 197 10/08/2021 1519   CHOL 154 04/19/2019 0826   TRIG 94.0 10/08/2021 1519   HDL 88.50 10/08/2021 1519   HDL 77 04/19/2019 0826   CHOLHDL 2 10/08/2021 1519   VLDL 18.8 10/08/2021 1519    LDLCALC 90 10/08/2021 1519   LDLCALC 57 04/19/2019 0826     Risk Assessment/Calculations:     Physical Exam:    VS:  Vitals:   09/20/22 1603  BP: 118/70  Pulse: 65  SpO2: 99%    Wt Readings from Last 3 Encounters:  09/20/22 182 lb 6.4 oz (82.7 kg)  09/03/22 178 lb 14.4 oz (81.1 kg)  07/22/22 176 lb 11.2 oz (80.2 kg)     GEN:  Well nourished, well developed in no acute distress HEENT: Normal NECK: No JVD; No carotid bruits LYMPHATICS: No lymphadenopathy CARDIAC: RRR, no murmurs, rubs, gallops RESPIRATORY:  Clear to auscultation without rales, wheezing or rhonchi  ABDOMEN: Soft, non-tender, non-distended MUSCULOSKELETAL:  No edema; No deformity  SKIN: Warm and dry NEUROLOGIC:  Alert and oriented x  3 PSYCHIATRIC:  Normal affect   ASSESSMENT:    Palpitations: 7 day ziopatch. Apple watch shows short RP -SVT. Vagal maneuvers, cold water submersion, carotid message. Dilt 30 mg Q6H PRN  Cardio-Onc: on 2 anti-HER2. GLS has been normal. She's continued on therapy. She has low CVD/cardiotoxicity risk. Can do TTE in 3-4 months. Can plan for an additional echo 33M post therapy (based on [facility] Cardio-Oncology guidelines for low-moderate risk patients). BC right sided, lower risk of RT accelerated atherosclerosis or valve dx.  PLAN:    In order of problems listed above:  7 day ziopatch Dilt PRN Follow up 3 months      Medication Adjustments/Labs and Tests Ordered: Current medicines are reviewed at length with the patient today.  Concerns regarding medicines are outlined above.   Orders Placed This Encounter  Procedures   LONG TERM MONITOR (3-14 DAYS)   EKG 12-Lead   No orders of the defined types were placed in this encounter.   Patient Instructions  Medication Instructions:  START: DILTIAZEM 75m EVERY 6 HOURS AS NEEDED FOR PALPITATIONS  *If you need a refill on your cardiac medications before your next appointment, please call your pharmacy*  Lab Work: None  Ordered At This Time.  If you have labs (blood work) drawn today and your tests are completely normal, you will receive your results only by: [place](if you have MyChart) OR A paper copy in the mail If you have any lab test that is abnormal or we need to change your treatment, we will call you to review the results.  Testing/Procedures:  [facility] Long Term Monitor Instructions   Your physician has requested you wear your ZIO patch monitor___7____days.   This is a single patch monitor.  [employer] supplies one patch monitor per enrollment.  Additional stickers are not available.   Please do not apply patch if you will be having a Nuclear Stress Test, Echocardiogram, Cardiac CT, MRI, or Chest Xray during the time frame you would be wearing the monitor. The patch cannot be worn during these tests.  You cannot remove and re-apply the ZIO XT patch monitor.   Your ZIO patch monitor will be [facility] USPS Priority [facility] your home address. The monitor may also be mailed to a PO BOX if home delivery is not available.   It may take 3-5 days to receive your monitor after you have been enrolled.   Once you have received you monitor, please review enclosed instructions.  Your monitor has already been registered assigning a specific monitor serial # to you.   Applying the monitor   Shave hair from upper left chest.   Hold abrader disc by orange tab.  Rub abrader in 40 strokes over left upper chest as indicated in your monitor instructions.   Clean area with 4 enclosed alcohol pads .  Use all pads to assure are is cleaned thoroughly.  Let dry.   Apply patch as indicated in monitor instructions.  Patch will be place under collarbone on left side of chest with arrow pointing upward.   Rub patch adhesive wings for 2 minutes.Remove white label marked "1".  Remove white label marked "2".  Rub patch adhesive wings for 2 additional minutes.   While looking in a mirror,  press and release button in center of patch.  A small green light will flash 3-4 times .  This will be your only indicator the monitor has been turned on.  Do not shower for the first 24 hours.  You may shower after the first 24 hours.   Press button if you feel a symptom. You will hear a small click.  Record Date, Time and Symptom in the Patient Log Book.   When you are ready to remove patch, follow instructions on last 2 pages of Patient Log Book.  Stick patch monitor onto last page of Patient Log Book.   Place Patient Log Book in [place] box.  Use locking tab on box and tape box closed securely.  The [employer] has [employer] on it.  Please place in mailbox as soon as possible.  Your physician should have your test results approximately 7 days after the monitor has been mailed back to [facility].   Call [place] at [phone] if you have questions regarding your ZIO XT patch monitor.  Call them immediately if you see an orange light blinking on your monitor.   If your monitor falls off in less than 4 days contact our Monitor department at [phone].  If your monitor becomes loose or falls off after 4 days call [employer] at [phone] for suggestions on securing your monitor.   Follow-Up: At [facility], you and your health needs are our priority.  As part of our continuing mission to provide you with exceptional heart care, we have created designated Provider Care Teams.  These Care Teams include your primary Cardiologist (physician) and Advanced Practice Providers (APPs -  Physician Assistants and Nurse Practitioners) who all work together to provide you with the care you need, when you need it.  Your next appointment:   3 month(s)  The format for your next appointment:   In Person  Provider:   Janina Mayo, MD          Signed, Janina Mayo, MD  09/20/2022 4:37 PM    [place]

## 2022-09-20 NOTE — Progress Notes (Unsigned)
Enrolled for [employer] to mail a ZIO XT long term holter monitor to the patients address on file.  

## 2022-09-20 NOTE — Patient Instructions (Addendum)
Medication Instructions:  START: DILTIAZEM '30mg'$  EVERY 6 HOURS AS NEEDED FOR PALPITATIONS  *If you need a refill on your cardiac medications before your next appointment, please call your pharmacy*  Lab Work: None Ordered At This Time.  If you have labs (blood work) drawn today and your tests are completely normal, you will receive your results only by: [place] (if you have MyChart) OR A paper copy in the mail If you have any lab test that is abnormal or we need to change your treatment, we will call you to review the results.  Testing/Procedures:  [facility]- Long Term Monitor Instructions   Your physician has requested you wear your ZIO patch monitor___7____days.   This is a single patch monitor.  [employer] supplies one patch monitor per enrollment.  Additional stickers are not available.   Please do not apply patch if you will be having a Nuclear Stress Test, Echocardiogram, Cardiac CT, MRI, or Chest Xray during the time frame you would be wearing the monitor. The patch cannot be worn during these tests.  You cannot remove and re-apply the ZIO XT patch monitor.   Your ZIO patch monitor will be sent USPS Priority mail from [facility] directly to your home address. The monitor may also be mailed to a PO BOX if home delivery is not available.   It may take 3-5 days to receive your monitor after you have been enrolled.   Once you have received you monitor, please review enclosed instructions.  Your monitor has already been registered assigning a specific monitor serial # to you.   Applying the monitor   Shave hair from upper left chest.   Hold abrader disc by orange tab.  Rub abrader in 40 strokes over left upper chest as indicated in your monitor instructions.   Clean area with 4 enclosed alcohol pads .  Use all pads to assure are is cleaned thoroughly.  Let dry.   Apply patch as indicated in monitor instructions.  Patch will be place under collarbone on left side of chest  with arrow pointing upward.   Rub patch adhesive wings for 2 minutes.Remove white label marked "1".  Remove white label marked "2".  Rub patch adhesive wings for 2 additional minutes.   While looking in a mirror, press and release button in center of patch.  A small green light will flash 3-4 times .  This will be your only indicator the monitor has been turned on.     Do not shower for the first 24 hours.  You may shower after the first 24 hours.   Press button if you feel a symptom. You will hear a small click.  Record Date, Time and Symptom in the Patient Log Book.   When you are ready to remove patch, follow instructions on last 2 pages of Patient Log Book.  Stick patch monitor onto last page of Patient Log Book.   Place Patient Log Book in [place] box.  Use locking tab on box and tape box closed securely.  The [employer] has [employer] on it.  Please place in mailbox as soon as possible.  Your physician should have your test results approximately 7 days after the monitor has been mailed back to [facility].   Call [place] at [phone] if you have questions regarding your ZIO XT patch monitor.  Call them immediately if you see an orange light blinking on your monitor.   If your monitor falls  off in less than 4 days contact our Monitor department at [phone].  If your monitor becomes loose or falls off after 4 days call [employer] at [phone] for suggestions on securing your monitor.   Follow-Up: At [facility], you and your health needs are our priority.  As part of our continuing mission to provide you with exceptional heart care, we have created designated Provider Care Teams.  These Care Teams include your primary Cardiologist (physician) and Advanced Practice Providers (APPs -  Physician Assistants and Nurse Practitioners) who all work together to provide you with the care you need, when you need it.  Your next appointment:   3  month(s)  The format for your next appointment:   In Person  Provider:   Janina Mayo, MD

## 2022-09-21 ENCOUNTER — Other Ambulatory Visit: Payer: Self-pay

## 2022-09-21 ENCOUNTER — Telehealth (HOSPITAL_BASED_OUTPATIENT_CLINIC_OR_DEPARTMENT_OTHER): Payer: Self-pay | Admitting: Obstetrics & Gynecology

## 2022-09-21 NOTE — Telephone Encounter (Signed)
Patient called stated her doctor gave her this number to set up appointment with [place] .Patient was schedule for fist available but stated  she needs be seen soon.

## 2022-09-22 ENCOUNTER — Other Ambulatory Visit: Payer: Self-pay

## 2022-09-22 NOTE — Progress Notes (Incomplete)
  Radiation Oncology         [phone] ________________________________  Patient Name: Julie Atkinson MRN: [id number] DOB: 31-May-1971 Referring Physician: Nicholas Lose (Profile Not Attached) Date of Service: 08/23/2022 [facility] [place]                                                        End Of Treatment Note  Diagnoses: C50.411-Malignant neoplasm of upper-outer quadrant of right female breast  Cancer Staging: Stage IB (cT2, cN0, cM0, G2, ER+, PR+, HER2+) -  Malignant neoplasm of upper-outer quadrant of right female breast ([place]) mixed lobular and invasive ductal phenotype  Intent: Curative  Radiation Treatment Dates: 07/15/2022 through 08/23/2022 Site Technique Total Dose (Gy) Dose per Fx (Gy) Completed Fx Beam Energies  Breast, Right: Breast_R 3D 50.4/50.4 1.8 28/28 6X   Narrative: The patient tolerated radiation therapy relatively well. On the date of her final treatment, the patient reported fatigue, hyperpigmentation changes, issues with ROM, swelling around the right nipple, and right breast tenderness. Physical exam performed on that same date revealed hyperpigmentation changes, mild swelling, and erythema to the right breast area. No signs of infection or skin breakdown were appreciated.  Plan: The patient will follow-up with radiation oncology in one month.  ________________________________________________ -----------------------------------  Blair Promise, PhD, MD  This document serves as a record of services personally performed by Gery Pray, MD. It was created on his behalf by Roney Mans, a trained medical scribe. The creation of this record is based on the scribe's personal observations and the provider's statements to them. This document has been checked and approved by the attending provider.

## 2022-09-22 NOTE — Progress Notes (Incomplete)
Radiation Oncology         [phone] ________________________________  Name: Julie Atkinson MRN: [id number]  Date: 09/23/2022  DOB: 1971/01/13  Follow-Up Visit Note  CC: Pcp, No  Nicholas Lose, MD    ICD-10-CM   1. Malignant neoplasm of upper-outer quadrant of right female breast, unspecified estrogen receptor status ([place])  C50.411       Diagnosis:   S/p lumpectomy and radiation  Stage 1B (cT2, cN0, cM0, G2, ER+, PR+, HER2+) Malignant neoplasm of upper-outer quadrant of right female breast  Interval Since Last Radiation:  48month Intent: Curative  Radiation Treatment Dates: 07/16/2022 - 08/23/2022, she completed 50.4 Gray in 28 fractions directed at the right breast.  Narrative:  The patient returns today for routine follow-up.  The patient tolerated treatment relatively well. On the last day of treatment patient reported skin redness which she was using Radiaplex for. She denied any lymphedema or issues with ROM. Physical exam of the right breast showed some hyperpigmentation changes most significant in the inframammary fold region and low axillary region. Breast was also erythematous and displayed some swelling.  Allergies:  is allergic to iodinated contrast media, latex, and codeine.  Meds: Current Outpatient Medications  Medication Sig Dispense Refill   acetaminophen (TYLENOL) 500 MG tablet Take 500 mg by mouth every 6 (six) hours as needed.     lidocaine-prilocaine (EMLA) cream Apply 1 Application topically as needed. 30 g 1   clindamycin (CLINDAGEL) 1 % gel Apply topically. (Patient not taking: Reported on 09/23/2022)     diltiazem (CARDIZEM) 30 MG tablet Take 1 tablet (30 mg total) by mouth every 6 (six) hours as needed (as needed for palpitations). (Patient not taking: Reported on 09/23/2022) 120 tablet 3   [START ON 10/04/2022] tamoxifen (NOLVADEX) 20 MG tablet Take 1 tablet (20 mg total) by mouth daily. 90 tablet 3   No current facility-administered medications for  this encounter.    Physical Findings: The patient is in no acute distress. Patient is alert and oriented.  height is _0  (1.727 m) and weight is 182 lb 6.4 oz (82.7 kg). Her temperature is 97.9 F (36.6 C). Her blood pressure is 130/87 and her pulse is 74. Her respiration is 18 and oxygen saturation is 100%. .  Lungs are clear to auscultation bilaterally. Heart has regular rate and rhythm. No palpable cervical, supraclavicular, or axillary adenopathy. Abdomen soft, non-tender, normal bowel sounds.  Examination of the right breast reveals skin to be well-healed.  Mild erythema and hyperpigmentation changes.  No dominant mass appreciated in the breast nipple discharge or bleeding.   Lab Findings: Lab Results  Component Value Date   WBC 5.4 09/03/2022   HGB 13.9 09/03/2022   HCT 39.9 09/03/2022   MCV 96.8 09/03/2022   PLT 168 09/03/2022    Radiographic Findings: ECHOCARDIOGRAM COMPLETE  Result Date: 09/15/2022    ECHOCARDIOGRAM REPORT   Patient Name:   Julie RESNICKDate of Exam: 09/15/2022 Medical Rec #:  [id number]     Height:       67.0 in Accession #:    [id number]    Weight:       178.9 lb Date of Birth:  5Mar 28, 1972      BSA:          1.929 m Patient Age:    89years       BP:           152/87 mmHg Patient Gender: F  HR:           71 bpm. Exam Location:  Outpatient Procedure: 2D Echo, 3D Echo, Cardiac Doppler, Color Doppler and Strain Analysis Indications:    Chemo Z09  History:        Patient has prior history of Echocardiogram examinations, most                 recent 06/11/2022. Breast Cancer.  Sonographer:    Darlina Sicilian RDCS Referring Phys: [phone] [place]  1. Left ventricular ejection fraction by 3D volume is 55 %. The left ventricle has normal function. The left ventricle has no regional wall motion abnormalities. Left ventricular diastolic parameters were normal. The average left ventricular global longitudinal strain is -19.6 %.  2. Right ventricular  systolic function is normal. The right ventricular size is normal.  3. The mitral valve is grossly normal. No evidence of mitral valve regurgitation. No evidence of mitral stenosis.  4. The aortic valve is grossly normal. Aortic valve regurgitation is trivial. No aortic stenosis is present. FINDINGS  Left Ventricle: Left ventricular ejection fraction by 3D volume is 55 %. The left ventricle has normal function. The left ventricle has no regional wall motion abnormalities. The average left ventricular global longitudinal strain is -19.6 %. The left ventricular internal cavity size was normal in size. There is no left ventricular hypertrophy. Left ventricular diastolic parameters were normal. Right Ventricle: The right ventricular size is normal. No increase in right ventricular wall thickness. Right ventricular systolic function is normal. Left Atrium: Left atrial size was normal in size. Right Atrium: Right atrial size was normal in size. Pericardium: There is no evidence of pericardial effusion. Mitral Valve: The mitral valve is grossly normal. No evidence of mitral valve regurgitation. No evidence of mitral valve stenosis. Tricuspid Valve: The tricuspid valve is grossly normal. Tricuspid valve regurgitation is trivial. No evidence of tricuspid stenosis. Aortic Valve: The aortic valve is grossly normal. Aortic valve regurgitation is trivial. No aortic stenosis is present. Pulmonic Valve: The pulmonic valve was grossly normal. Pulmonic valve regurgitation is not visualized. No evidence of pulmonic stenosis. Aorta: The aortic root and ascending aorta are structurally normal, with no evidence of dilitation. IAS/Shunts: No atrial level shunt detected by color flow Doppler.  LEFT VENTRICLE PLAX 2D LVIDd:         5.00 cm         Diastology LVIDs:         3.30 cm         LV e' medial:    8.94 cm/s LV PW:         0.80 cm         LV E/e' medial:  9.0 LV IVS:        0.70 cm         LV e' lateral:   9.29 cm/s LVOT diam:      2.10 cm         LV E/e' lateral: 8.6 LV SV:         67 LV SV Index:   35              2D LVOT Area:     3.46 cm        Longitudinal                                Strain  2D Strain GLS  -23.6 %                                (A2C):                                2D Strain GLS  -15.7 %                                (A3C):                                2D Strain GLS  -19.6 %                                (A4C):                                2D Strain GLS  -19.6 %                                Avg:                                 3D Volume EF                                LV 3D EF:    Left                                             ventricul                                             ar                                             ejection                                             fraction                                             by 3D                                             volume is  55 %.                                 3D Volume EF:                                3D EF:        55 %                                LV EDV:       121 ml                                LV ESV:       55 ml                                LV SV:        66 ml RIGHT VENTRICLE RV S prime:     13.80 cm/s TAPSE (M-mode): 2.5 cm LEFT ATRIUM             Index        RIGHT ATRIUM           Index LA diam:        2.70 cm 1.40 cm/m   RA Area:     14.30 cm LA Vol (A2C):   41.1 ml 21.31 ml/m  RA Volume:   30.00 ml  15.55 ml/m LA Vol (A4C):   34.8 ml 18.04 ml/m LA Biplane Vol: 38.1 ml 19.75 ml/m  AORTIC VALVE LVOT Vmax:   87.50 cm/s LVOT Vmean:  60.400 cm/s LVOT VTI:    0.193 m  AORTA Ao Root diam: 3.00 cm Ao Asc diam:  2.90 cm MITRAL VALVE MV Area (PHT): 3.97 cm    SHUNTS MV Decel Time: 191 msec    Systemic VTI:  0.19 m MV E velocity: 80.10 cm/s  Systemic Diam: 2.10 cm MV A velocity: 61.70 cm/s MV E/A ratio:  1.30 Kardie Tobb DO Electronically signed by  Berniece Salines DO Signature Date/Time: 09/15/2022/3:33:14 PM    Final     Impression:  Stage 1B (cT2, cN0, cM0, G2, ER+, PR+, HER2+) Malignant neoplasm of upper-outer quadrant of right female breast  She has recovered well from her radiation therapy.  No evidence of recurrence on clinical exam today.  Plan: As needed follow-up in radiation oncology.  She will continue close follow-up in medical oncology and with her surgeons.  ____________________________________  Blair Promise, PhD, MD  This document serves as a record of services personally performed by Gery Pray, MD. It was created on his behalf by Roney Mans, a trained medical scribe. The creation of this record is based on the scribe's personal observations and the provider's statements to them. This document has been checked and approved by the attending provider.

## 2022-09-22 NOTE — Progress Notes (Signed)
Radiation Oncology         [phone] ________________________________  Name: Julie Atkinson MRN: [id number]  Date: 09/23/2022  DOB: 10-15-1971  Follow-Up Visit Note  CC: Pcp, No  Nicholas Lose, MD    ICD-10-CM   1. Malignant neoplasm of upper-outer quadrant of right female breast, unspecified estrogen receptor status ([place])  C50.411       Diagnosis:  Stage IB (cT2, cN0, cM0, G2, ER+, PR+, HER2+) -  Malignant neoplasm of upper-outer quadrant of right female breast ([place]) mixed lobular and invasive ductal phenotype  Interval Since Last Radiation: 1 month  Intent: Curative  Radiation Treatment Dates: 07/15/2022 through 08/23/2022 Site Technique Total Dose (Gy) Dose per Fx (Gy) Completed Fx Beam Energies  Breast, Right: Breast_R 3D 50.4/50.4 1.8 28/28 6X    Narrative:  The patient returns today for routine follow-up.  The patient tolerated radiation therapy relatively well. On the date of her final treatment, the patient reported fatigue, hyperpigmentation changes, issues with ROM, swelling around the right nipple, and right breast tenderness. Physical exam performed on that same date revealed hyperpigmentation changes, mild swelling, and erythema to the right breast area. No signs of infection or skin breakdown were appreciated.      In the interval since the patient was seen in consultation, she has continued on with maintenance treatment consisting of Herceptin.   During her most recent follow-up visit with Dr. 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 She also endorsed some mild ongoing soreness and feeling more tired. The patient also consented to proceeding with antiestrogen therapy consisting of tamoxifen x 5-10 years in duration (start on 10/04/22).          Of note: The patient also recently followed up with plastic surgery on 09/07/22. Physical exam performed at the time  of the visit noted minimal RT changes to the right breast, as well as some expected edema and protrusion of the nipple from edema.         Today she reports to be doing very well. She states she has some in her breast that she is not taking pain medication for. She has some swelling in her right breast but is continuing lymphatic massages and seeing PT. She is continuing lymphatic massage and seeing PT. She will begin to take Tamoxifen on 10/04/22.   Of note, she did experience an episode of SVT on Sunday. She was given cardizem to take PRN.   Allergies:  is allergic to iodinated contrast media, latex, and codeine.  Meds: Current Outpatient Medications  Medication Sig Dispense Refill   acetaminophen (TYLENOL) 500 MG tablet Take 500 mg by mouth every 6 (six) hours as needed.     lidocaine-prilocaine (EMLA) cream Apply 1 Application topically as needed. 30 g 1   clindamycin (CLINDAGEL) 1 % gel Apply topically. (Patient not taking: Reported on 09/23/2022)     diltiazem (CARDIZEM) 30 MG tablet Take 1 tablet (30 mg total) by mouth every 6 (six) hours as needed (as needed for palpitations). (Patient not taking: Reported on 09/23/2022) 120 tablet 3   [START ON 10/04/2022] tamoxifen (NOLVADEX) 20 MG tablet Take 1 tablet (20 mg total) by mouth daily. 90 tablet 3   No current facility-administered medications for this encounter.    Physical Findings: The patient is in no acute distress. Patient is alert and oriented.  height is _0  (1.727 m) and weight is 182  lb 6.4 oz (82.7 kg). Her temperature is 97.9 F (36.6 C). Her blood pressure is 130/87 and her pulse is 74. Her respiration is 18 and oxygen saturation is 100%. .  No significant changes. Lungs are clear to auscultation bilaterally. Heart has regular rate and rhythm. No palpable cervical, supraclavicular, or axillary adenopathy. Abdomen soft, non-tender, normal bowel sounds.  Left Breast: no palpable mass, nipple discharge or bleeding. Right  Breast: Moderate swelling in the right breast. Slight tenderness to palpation. Consistent with lymphatic fluid. No erythema, warmth or other signs of infection noted. No obvious masses palpated. No nipple discharge or bleeding. Skin is slightly hyperpigmented in the field of radiation therapy.   Lab Findings: Lab Results  Component Value Date   WBC 5.4 09/03/2022   HGB 13.9 09/03/2022   HCT 39.9 09/03/2022   MCV 96.8 09/03/2022   PLT 168 09/03/2022    Radiographic Findings: ECHOCARDIOGRAM COMPLETE  Result Date: 09/15/2022    ECHOCARDIOGRAM REPORT   Patient Name:   Julie Atkinson Date of Exam: 09/15/2022 Medical Rec #:  [id number]      Height:       67.0 in Accession #:    [id number]     Weight:       178.9 lb Date of Birth:  28-Nov-1970       BSA:          1.929 m Patient Age:    51 years       BP:           152/87 mmHg Patient Gender: F              HR:           71 bpm. Exam Location:  Outpatient Procedure: 2D Echo, 3D Echo, Cardiac Doppler, Color Doppler and Strain Analysis Indications:    Chemo Z09  History:        Patient has prior history of Echocardiogram examinations, most                 recent 06/11/2022. Breast Cancer.  Sonographer:    Darlina Sicilian RDCS Referring Phys: [phone] [place]  1. Left ventricular ejection fraction by 3D volume is 55 %. The left ventricle has normal function. The left ventricle has no regional wall motion abnormalities. Left ventricular diastolic parameters were normal. The average left ventricular global longitudinal strain is -19.6 %.  2. Right ventricular systolic function is normal. The right ventricular size is normal.  3. The mitral valve is grossly normal. No evidence of mitral valve regurgitation. No evidence of mitral stenosis.  4. The aortic valve is grossly normal. Aortic valve regurgitation is trivial. No aortic stenosis is present. FINDINGS  Left Ventricle: Left ventricular ejection fraction by 3D volume is 55 %. The left ventricle has  normal function. The left ventricle has no regional wall motion abnormalities. The average left ventricular global longitudinal strain is -19.6 %. The left ventricular internal cavity size was normal in size. There is no left ventricular hypertrophy. Left ventricular diastolic parameters were normal. Right Ventricle: The right ventricular size is normal. No increase in right ventricular wall thickness. Right ventricular systolic function is normal. Left Atrium: Left atrial size was normal in size. Right Atrium: Right atrial size was normal in size. Pericardium: There is no evidence of pericardial effusion. Mitral Valve: The mitral valve is grossly normal. No evidence of mitral valve regurgitation. No evidence of mitral valve stenosis. Tricuspid Valve: The tricuspid valve is grossly normal.  Tricuspid valve regurgitation is trivial. No evidence of tricuspid stenosis. Aortic Valve: The aortic valve is grossly normal. Aortic valve regurgitation is trivial. No aortic stenosis is present. Pulmonic Valve: The pulmonic valve was grossly normal. Pulmonic valve regurgitation is not visualized. No evidence of pulmonic stenosis. Aorta: The aortic root and ascending aorta are structurally normal, with no evidence of dilitation. IAS/Shunts: No atrial level shunt detected by color flow Doppler.  LEFT VENTRICLE PLAX 2D LVIDd:         5.00 cm         Diastology LVIDs:         3.30 cm         LV e' medial:    8.94 cm/s LV PW:         0.80 cm         LV E/e' medial:  9.0 LV IVS:        0.70 cm         LV e' lateral:   9.29 cm/s LVOT diam:     2.10 cm         LV E/e' lateral: 8.6 LV SV:         67 LV SV Index:   35              2D LVOT Area:     3.46 cm        Longitudinal                                Strain                                2D Strain GLS  -23.6 %                                (A2C):                                2D Strain GLS  -15.7 %                                (A3C):                                2D Strain GLS   -19.6 %                                (A4C):                                2D Strain GLS  -19.6 %                                Avg:                                 3D Volume EF  LV 3D EF:    Left                                             ventricul                                             ar                                             ejection                                             fraction                                             by 3D                                             volume is                                             55 %.                                 3D Volume EF:                                3D EF:        55 %                                LV EDV:       121 ml                                LV ESV:       55 ml                                LV SV:        66 ml RIGHT VENTRICLE RV S prime:     13.80 cm/s TAPSE (M-mode): 2.5 cm LEFT ATRIUM             Index        RIGHT ATRIUM           Index LA diam:        2.70 cm 1.40 cm/m   RA Area:     14.30 cm LA  Vol [facility]):   41.1 ml 21.31 ml/m  RA Volume:   30.00 ml  15.55 ml/m LA Vol (A4C):   34.8 ml 18.04 ml/m LA Biplane Vol: 38.1 ml 19.75 ml/m  AORTIC VALVE LVOT Vmax:   87.50 cm/s LVOT Vmean:  60.400 cm/s LVOT VTI:    0.193 m  AORTA Ao Root diam: 3.00 cm Ao Asc diam:  2.90 cm MITRAL VALVE MV Area (PHT): 3.97 cm    SHUNTS MV Decel Time: 191 msec    Systemic VTI:  0.19 m MV E velocity: 80.10 cm/s  Systemic Diam: 2.10 cm MV A velocity: 61.70 cm/s MV E/A ratio:  1.30 Kardie Tobb DO Electronically signed by Berniece Salines DO Signature Date/Time: 09/15/2022/3:33:14 PM    Final     Impression:  Stage IB (cT2, cN0, cM0, G2, ER+, PR+, HER2+) -  Malignant neoplasm of upper-outer quadrant of right female breast ([facility]) mixed lobular and invasive ductal phenotype  The patient is recovering well from the effects of radiation. No evidence of disease recurrence on exam today. She is still experiencing some mild  lymphatic swelling in her right breast.   Plan:  As needed follow up with radiation oncology. Continue with lymphatic massages and PT visits to reduce lymphatic swelling. Continue close follow up with medical oncology and begin Tamoxifen therapy on 10/04/22.   15 minutes of total time was spent for this patient encounter, including preparation, face-to-face counseling with the patient and coordination of care, physical exam, and documentation of the encounter. ____________________________________   Leona Singleton, PA   Blair Promise, PhD, MD  This document serves as a record of services personally performed by Gery Pray, MD. It was created on his behalf by Roney Mans, a trained medical scribe. The creation of this record is based on the scribe's personal observations and the provider's statements to them. This document has been checked and approved by the attending provider.

## 2022-09-23 ENCOUNTER — Other Ambulatory Visit: Payer: Self-pay | Admitting: Surgery

## 2022-09-23 ENCOUNTER — Encounter: Payer: Self-pay | Admitting: Adult Health

## 2022-09-23 ENCOUNTER — Inpatient Hospital Stay (HOSPITAL_BASED_OUTPATIENT_CLINIC_OR_DEPARTMENT_OTHER): Payer: BC Managed Care – PPO | Admitting: Adult Health

## 2022-09-23 ENCOUNTER — Encounter: Payer: Self-pay | Admitting: *Deleted

## 2022-09-23 ENCOUNTER — Inpatient Hospital Stay: Payer: BC Managed Care – PPO | Attending: Hematology and Oncology

## 2022-09-23 ENCOUNTER — Other Ambulatory Visit: Payer: Self-pay

## 2022-09-23 ENCOUNTER — Ambulatory Visit
Admission: RE | Admit: 2022-09-23 | Discharge: 2022-09-23 | Disposition: A | Discharge status: Home / Self Care | Payer: BC Managed Care – PPO | Source: Ambulatory Visit | Attending: Radiation Oncology | Admitting: Radiation Oncology

## 2022-09-23 ENCOUNTER — Encounter: Payer: Self-pay | Admitting: Radiation Oncology

## 2022-09-23 VITALS — BP 123/77 | HR 73 | Resp 17

## 2022-09-23 VITALS — BP 130/87 | HR 74 | Temp 97.9°F | Resp 18 | Ht 68.0 in | Wt 182.4 lb

## 2022-09-23 DIAGNOSIS — Z79899 Other long term (current) drug therapy: Secondary | ICD-10-CM | POA: Insufficient documentation

## 2022-09-23 DIAGNOSIS — Z5111 Encounter for antineoplastic chemotherapy: Secondary | ICD-10-CM | POA: Diagnosis not present

## 2022-09-23 DIAGNOSIS — Z923 Personal history of irradiation: Secondary | ICD-10-CM | POA: Insufficient documentation

## 2022-09-23 DIAGNOSIS — C50411 Malignant neoplasm of upper-outer quadrant of right female breast: Secondary | ICD-10-CM | POA: Insufficient documentation

## 2022-09-23 DIAGNOSIS — Z170 Estrogen receptor positive status [ER+]: Secondary | ICD-10-CM | POA: Insufficient documentation

## 2022-09-23 DIAGNOSIS — Z87891 Personal history of nicotine dependence: Secondary | ICD-10-CM | POA: Insufficient documentation

## 2022-09-23 DIAGNOSIS — Z79810 Long term (current) use of selective estrogen receptor modulators (SERMs): Secondary | ICD-10-CM | POA: Diagnosis not present

## 2022-09-23 HISTORY — DX: Personal history of irradiation: Z92.3

## 2022-09-23 MED ORDER — SODIUM CHLORIDE 0.9 % IV SOLN: Freq: Once | INTRAVENOUS | Status: AC

## 2022-09-23 MED ORDER — TRASTUZUMAB-DKST CHEMO 150 MG IV SOLR
6.0000 mg/kg | Freq: Once | INTRAVENOUS | Status: AC
  Administered 2022-09-23: 483 mg via INTRAVENOUS
  Filled 2022-09-23: qty 23

## 2022-09-23 MED ORDER — ACETAMINOPHEN 325 MG PO TABS
650.0000 mg | ORAL_TABLET | Freq: Once | ORAL | Status: AC
  Administered 2022-09-23: 650 mg via ORAL
  Filled 2022-09-23: qty 2

## 2022-09-23 MED ORDER — HEPARIN SOD (PORK) LOCK FLUSH 100 UNIT/ML IV SOLN
500.0000 [IU] | Freq: Once | INTRAVENOUS | Status: AC | PRN
  Administered 2022-09-23: 500 [IU]

## 2022-09-23 MED ORDER — SODIUM CHLORIDE 0.9% FLUSH
10.0000 mL | INTRAVENOUS | Status: DC | PRN
  Administered 2022-09-23: 10 mL

## 2022-09-23 MED ORDER — LORATADINE 10 MG PO TABS
10.0000 mg | ORAL_TABLET | Freq: Once | ORAL | Status: AC
  Administered 2022-09-23: 10 mg via ORAL
  Filled 2022-09-23: qty 1

## 2022-09-23 NOTE — Progress Notes (Signed)
Per Dr. Lindi Adie okay to proceed with trastuzumab with decrease of 5% of echo along with episode of SVT.

## 2022-09-23 NOTE — Progress Notes (Signed)
Julie Atkinson is here today for follow up post radiation to the breast.   Breast Side:right   They completed their radiation on: 08/23/2022   Does the patient complain of any of the following: Post radiation skin issues: reports skin is healed and tight. Breast Tenderness: yes Breast Swelling: yes Lymphadema: no - doing lympatic massage and seeing PT Range of Motion limitations: right arm is tight Fatigue post radiation: yes Appetite good/fair/poor: good  Additional comments if applicable: She will taking Tamoxifen on 10/04/22. She also reports having an episode of SVT on Sunday. She was given cardizem to take PRN.  BP 130/87 (BP Location: Left Arm, Patient Position: Sitting, Cuff Size: Normal)   Pulse 74   Temp 97.9 F (36.6 C)   Resp 18   Ht '5\' 8"'$  (1.727 m)   Wt 182 lb 6.4 oz (82.7 kg)   SpO2 100%   BMI 27.73 kg/m

## 2022-09-23 NOTE — Progress Notes (Signed)
SURVIVORSHIP VISIT:  BRIEF ONCOLOGIC HISTORY:  Oncology History  Malignant neoplasm of upper-outer quadrant of right female breast ([place])  11/19/2021 Initial Diagnosis   Palpable lump for 3 weeks, diagnostic mammogram: 2.2 cm x 2.2 cm irregular mass with a spiculated margin in the upper outer right breast. 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 HER2 positive, Ki-67 15%   12/02/2021 Cancer Staging   Staging form: Breast, AJCC 8th Edition - Clinical stage from 12/02/2021: Stage IB (cT2, cN0, cM0, G2, ER+, PR+, HER2+) - Signed by Nicholas Lose, MD on 12/02/2021 Stage prefix: Initial diagnosis Histologic grading system: 3 grade system   12/16/2021 - 05/12/2022 Chemotherapy   Patient is on Treatment Plan : BREAST  Docetaxel + Carboplatin + Trastuzumab + Pertuzumab  (TCHP) q21d      01/22/2022 Genetic Testing   Negative hereditary cancer genetic testing: no pathogenic variants detected in [facility]RNAinsight Panel.  Report date is 01/22/2022.   The CancerNext gene panel offered by [employer] includes sequencing, rearrangement analysis, and RNA analysis for the following 36 genes:   APC, ATM, AXIN2, BARD1, BMPR1A, BRCA1, BRCA2, BRIP1, CDH1, CDK4, CDKN2A, CHEK2, DICER1, HOXB13, EPCAM, GREM1, MLH1, MSH2, MSH3, MSH6, MUTYH, NBN, NF1, NTHL1, PALB2, PMS2, POLD1, POLE, PTEN, RAD51C, RAD51D, RECQL, SMAD4, SMARCA4, STK11, and TP53.    04/22/2022 -  Chemotherapy   Patient is on Treatment Plan : BREAST MAINTENANCE Trastuzumab IV (6) or SQ (600) D1 q21d X 11 Cycles     05/05/2022 Surgery   Rt retroareolar lumpectomy and Rt UOQ lumpectomy: No residual cancer, Lt Lumpectomy: Benign Complete Pathological Response   06/04/2022 - 07/02/2022 Chemotherapy   Patient is on Treatment Plan : BREAST Trastuzumab  + Pertuzumab q21d x 13 cycles     07/16/2022 - 08/23/2022 Radiation Therapy   50.4 Gy in 28 treatments to Right Breast   09/2022 -  Anti-estrogen oral therapy   Tamoxifen x 5-10 years      INTERVAL HISTORY:  Ms. Eberwein to review her survivorship care plan detailing her treatment course for breast cancer, as well as monitoring long-term side effects of that treatment, education regarding health maintenance, screening, and overall wellness and health promotion.     Overall, Ms. Brien reports feeling moderately well.  She is interested in learning more about healthy diet and is nervous about starting on tamoxifen therapy.  She continues on Herceptin with good tolerance.    REVIEW OF SYSTEMS:  Review of Systems  Constitutional:  Negative for appetite change, chills, fatigue, fever and unexpected weight change.  HENT:   Negative for hearing loss, lump/mass and trouble swallowing.   Eyes:  Negative for eye problems and icterus.  Respiratory:  Negative for chest tightness, cough and shortness of breath.   Cardiovascular:  Negative for chest pain, leg swelling and palpitations.  Gastrointestinal:  Negative for abdominal distention, abdominal pain, constipation, diarrhea, nausea and vomiting.  Endocrine: Negative for hot flashes.  Genitourinary:  Negative for difficulty urinating.   Musculoskeletal:  Negative for arthralgias.  Skin:  Negative for itching and rash.  Neurological:  Negative for dizziness, extremity weakness, headaches and numbness.  Hematological:  Negative for adenopathy. Does not bruise/bleed easily.  Psychiatric/Behavioral:  Negative for depression. The patient is not nervous/anxious.   Breast: Denies any new nodularity, masses, tenderness, nipple changes, or nipple discharge.      ONCOLOGY TREATMENT TEAM:  1. Surgeon:  Dr. Ninfa Linden at [facility] 2. Medical Oncologist: Dr. Lindi Adie  3. Radiation Oncologist: Dr. Sondra Come  PAST MEDICAL/SURGICAL HISTORY:  Past Medical History:  Diagnosis Date   Depression    Family history of breast cancer 12/04/2021   Family history of ovarian cancer 12/04/2021   Fibroid 01/03/2014   8 mm   History of  posttraumatic stress disorder (PTSD) 01-04-08   due to maternal death-MI   History of radiation therapy    Right breast- 07/15/22-08/23/22- Dr. Gery Pray   PTSD (post-traumatic stress disorder)    mother's death 01-04-2008; seizure and AMI while driving, patient performed CPR x 45 minutes   Seropositive for herpes simplex 2 infection January 04, 2016   Past Surgical History:  Procedure Laterality Date   BLADDER SURGERY  1978   nocturnal enuresis treatment   CESAREAN SECTION  2006/01/03   Dr Quincy Simmonds   DILATATION & CURETTAGE/HYSTEROSCOPY WITH MYOSURE N/A 01/06/2016   Procedure: DILATATION & CURETTAGE/HYSTEROSCOPY WITH Jacklynn Barnacle;  Surgeon: Nunzio Cobbs, MD;  Location: [facility];  Service: Gynecology;  Laterality: N/A;   DILATION AND CURETTAGE OF UTERUS  1990   MAB?   PORTACATH PLACEMENT Left 12/15/2021   Procedure: INSERTION PORT-A-CATH;  Surgeon: Coralie Keens, MD;  Location: [facility];  Service: General;  Laterality: Left;     ALLERGIES:  Allergies  Allergen Reactions   Iodinated Contrast Media Other (See Comments)    Mother had anaphylactic shock   Latex Other (See Comments)    Could be adhesive Rash on Port site Needs sensitive skin dressing   Codeine Nausea And Vomiting     CURRENT MEDICATIONS:  Outpatient Encounter Medications as of 09/23/2022  Medication Sig   acetaminophen (TYLENOL) 500 MG tablet Take 500 mg by mouth every 6 (six) hours as needed.   lidocaine-prilocaine (EMLA) cream Apply 1 Application topically as needed.   clindamycin (CLINDAGEL) 1 % gel Apply topically. (Patient not taking: Reported on 09/23/2022)   diltiazem (CARDIZEM) 30 MG tablet Take 1 tablet (30 mg total) by mouth every 6 (six) hours as needed (as needed for palpitations). (Patient not taking: Reported on 09/23/2022)   [START ON 10/04/2022] tamoxifen (NOLVADEX) 20 MG tablet Take 1 tablet (20 mg total) by mouth daily.   [DISCONTINUED] sodium chloride flush (NS) 0.9 % injection 10 mL    No  facility-administered encounter medications on file as of 09/23/2022.     ONCOLOGIC FAMILY HISTORY:  Family History  Problem Relation Age of Onset   Pulmonary disease Mother    Heart disease Mother    Cervical cancer Mother 78   Breast cancer Maternal Aunt        dx late 42s   Lung cancer Maternal Uncle        d. 17   Bone cancer Paternal Grandmother        primary? limited info; d. 24s   Breast cancer Cousin 59       maternal female cousin   Ovarian cancer Cousin        maternal cousin; d. 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   Lives with her son.   Lost her job suddenly 08/20/2016 with job cuts at work.   Social Determinants of Health   Financial Resource Strain: Not on file  Food Insecurity: Not on file  Transportation Needs: Not on file  Physical Activity: Not on file  Stress: Not on file  Social Connections: Not on file  Intimate Partner Violence: Not on file     OBSERVATIONS/OBJECTIVE:  There were no vitals taken for this visit. See vitals in [facility] GENERAL: Patient is a well appearing female in no acute  distress HEENT:  Sclerae anicteric.  Oropharynx clear and moist. No ulcerations or evidence of oropharyngeal candidiasis. Neck is supple.  NODES:  No cervical, supraclavicular, or axillary lymphadenopathy palpated.  BREAST EXAM: Status post right lumpectomy and radiation no sign of local recurrence left breast status postlumpectomy otherwise benign. LUNGS:  Clear to auscultation bilaterally.  No wheezes or rhonchi. HEART:  Regular rate and rhythm. No murmur appreciated. ABDOMEN:  Soft, nontender.  Positive, normoactive bowel sounds. No organomegaly palpated. MSK:  No focal spinal tenderness to palpation. Full range of motion bilaterally in the upper extremities. EXTREMITIES:  No peripheral edema.   SKIN:  Clear with no obvious rashes or skin changes. No nail dyscrasia. NEURO:  Nonfocal. Well oriented.  Appropriate affect.   LABORATORY DATA:  None for this visit.  DIAGNOSTIC IMAGING:  None for this visit.      ASSESSMENT AND PLAN:  Ms.. Rybacki is a pleasant 51 y.o. female with Stage 1B right breast invasive ductal carcinoma, ER+/PR+/HER2+, diagnosed in January 2023, treated with neoadjuvant chemotherapy, lumpectomy, maintenance Trastuzumab, adjuvant radiation therapy, and anti-estrogen therapy with Tamoxifen beginning in today.  She presents to the Survivorship Clinic for our initial meeting and routine follow-up post-completion of treatment for breast cancer.    1. Stage IB right breast cancer:  Ms. Deeds is continuing to recover from definitive treatment for breast cancer. She will continue on her maintenance Trastuzumab.  She and I discussed the Tamoxifen in detail.  She is understandably conscerned about potential side effects.  I suggested that she start taking lower dose of the tamoxifen at 62m per day instead of 273mand then increase after a few weeks. Her mammogram is due 11/2022; orders placed today.  I also sent a message to Dr. MiSabra Hecko see if she could see her sooner to discuss  her gynecologic monitoring and pelvic health.     Today, a comprehensive survivorship care plan and treatment summary was reviewed with the patient today detailing her breast cancer diagnosis, treatment course, potential late/long-term effects of treatment, appropriate follow-up care with recommendations for the future, and patient education resources.  A copy of this summary, along with a letter will be sent to the patient's primary care provider via mail/fax/In Basket message after today's visit.    2. Bone health:  She was given education on specific activities to promote bone health.  3. Cancer  screening:  Due to Ms. Moorhouse's history and her age, she should receive screening for skin cancers, colon cancer, and gynecologic cancers.  The information and recommendations are listed on the patient's comprehensive care plan/treatment summary and were reviewed in detail with the patient.    4. Health maintenance and wellness promotion: Ms. Cutsforth was encouraged to consume 5-7 servings of fruits and vegetables per day. We reviewed the "Nutrition Rainbow" handout.  She was also encouraged to engage in moderate to vigorous exercise for 30 minutes per day most days of the week. We discussed the [employer] fitness program, which is designed for cancer survivors to help them become more physically fit after cancer treatments.  She was instructed to limit her alcohol consumption and continue to abstain from tobacco use.   5. Support services/counseling: It is not uncommon for this period of the patient's cancer care trajectory to be one of many emotions and stressors.  She was given information regarding our available services and encouraged to contact me with any questions or for help enrolling in any of our support group/programs.    Follow up instructions:    -Return to cancer center every 3 weeks to complete Herceptin  -Mammogram due in 11/2022 -She is welcome to return back to the Survivorship Clinic  at any time; no additional follow-up needed at this time.  -Consider referral back to survivorship as a long-term survivor for continued surveillance  The patient was provided an opportunity to ask questions and all were answered. The patient agreed with the plan and demonstrated an understanding of the instructions.   Total encounter time:60 minutes*in face-to-face visit time, chart review, lab review, care coordination, order entry, and documentation of the encounter time.  Wilber Bihari, NP 09/26/22 2:26 PM Medical Oncology and Hematology [facility] [place], [place] [address] Tel. [phone]    Fax. [phone]  *Total Encounter Time as defined by the Centers for Medicare and Medicaid Services includes, in addition to the face-to-face time of a patient visit (documented in the note above) non-face-to-face time: obtaining and reviewing outside history, ordering and reviewing medications, tests or procedures, care coordination (communications with other health care professionals or caregivers) and documentation in the medical record.

## 2022-09-24 ENCOUNTER — Other Ambulatory Visit: Payer: Self-pay | Admitting: Surgery

## 2022-09-24 ENCOUNTER — Inpatient Hospital Stay: Payer: BC Managed Care – PPO

## 2022-09-26 ENCOUNTER — Encounter: Payer: Self-pay | Admitting: Adult Health

## 2022-09-26 ENCOUNTER — Encounter: Payer: Self-pay | Admitting: Hematology and Oncology

## 2022-09-27 ENCOUNTER — Ambulatory Visit: Payer: BC Managed Care – PPO | Admitting: Rehabilitation

## 2022-09-27 ENCOUNTER — Encounter: Payer: Self-pay | Admitting: Rehabilitation

## 2022-09-27 DIAGNOSIS — R601 Generalized edema: Secondary | ICD-10-CM

## 2022-09-27 DIAGNOSIS — M79601 Pain in right arm: Secondary | ICD-10-CM | POA: Diagnosis not present

## 2022-09-27 DIAGNOSIS — M6281 Muscle weakness (generalized): Secondary | ICD-10-CM | POA: Diagnosis not present

## 2022-09-27 DIAGNOSIS — R293 Abnormal posture: Secondary | ICD-10-CM

## 2022-09-27 DIAGNOSIS — Z170 Estrogen receptor positive status [ER+]: Secondary | ICD-10-CM

## 2022-09-27 DIAGNOSIS — C50411 Malignant neoplasm of upper-outer quadrant of right female breast: Secondary | ICD-10-CM | POA: Diagnosis not present

## 2022-09-27 NOTE — Telephone Encounter (Signed)
LMOVM that pt has been placed on cancellation list for new patient appt.

## 2022-09-27 NOTE — Therapy (Signed)
[facility] OUTPATIENT PHYSICAL THERAPY BREAST CANCER POST OP FOLLOW UP   Patient Name: Julie Atkinson MRN: [id number] DOB:[birth year], 51 y.o., female Today's Date: 09/27/2022   PT End of Session - 09/27/22 0951     Visit Number 3    Number of Visits 6    Date for PT Re-Evaluation 11/24/22    PT Start Time 0951    PT Stop Time 1048    PT Time Calculation (min) 57 min    Activity Tolerance Patient tolerated treatment well    Behavior During Therapy [facility] for tasks assessed/performed               Past Medical History:  Diagnosis Date   Depression    Family history of breast cancer 12/04/2021   Family history of ovarian cancer 12/04/2021   Fibroid 12/30/13   8 mm   History of posttraumatic stress disorder (PTSD) December 31, 2007   due to maternal death-MI   History of radiation therapy    Right breast- 07/15/22-08/23/22- Dr. Gery Pray   PTSD (post-traumatic stress disorder)    mother's death 12-31-2007; seizure and AMI while driving, patient performed CPR x 45 minutes   Seropositive for herpes simplex 2 infection 2015/12/31   Past Surgical History:  Procedure Laterality Date   BLADDER SURGERY  1978   nocturnal enuresis treatment   CESAREAN SECTION  2005-12-30   Dr Quincy Simmonds   DILATATION & CURETTAGE/HYSTEROSCOPY WITH MYOSURE N/A 01/06/2016   Procedure: DILATATION & CURETTAGE/HYSTEROSCOPY WITH Jacklynn Barnacle;  Surgeon: Nunzio Cobbs, MD;  Location: [facility];  Service: Gynecology;  Laterality: N/A;   DILATION AND CURETTAGE OF UTERUS  1990   MAB?   PORTACATH PLACEMENT Left 12/15/2021   Procedure: INSERTION PORT-A-CATH;  Surgeon: Coralie Keens, MD;  Location: [facility];  Service: General;  Laterality: Left;   Patient Active Problem List   Diagnosis Date Noted   Port-A-Cath in place 12/16/2021   Family history of breast cancer 12/04/2021   Family history of ovarian cancer 12/04/2021   Genetic testing 12/04/2021   Malignant neoplasm of upper-outer quadrant of right female breast ([place])  12/01/2021   Breast lump on right side at 10 o'clock position 10/21/2021   PTSD (post-traumatic stress disorder)     PCP: [name], NP  REFERRING PROVIDER: Coralie Keens, MD  REFERRING DIAG: Right breast Cancer   THERAPY DIAG:  Malignant neoplasm of upper-outer quadrant of right breast in female, estrogen receptor positive ([place])  Pain in right arm  Abnormal posture  Generalized edema  Muscle weakness (generalized)  Rationale for Evaluation and Treatment Rehabilitation  ONSET DATE: 11/17/2021  SUBJECTIVE:  SUBJECTIVE STATEMENT: She hit her breast and it is a little tender. Other than that there is no changes.  PERTINENT HISTORY:  Patient was diagnosed on 11/17/2021 with right grade grade II invasive ductal and lobular carcinoma breast cancer.. It measures 2.4 cm and is located in the upper outer quadrant. It is triple positive with a Ki67 of 15%.    05/24/2022- bilateral breast reduction   PATIENT GOALS:  Reassess how my recovery is going related to arm function, pain, and swelling.  PAIN:  Evaluation: Are you having pain? Yes: NPRS scale: 6/10 Pain location: on right breast and side Pain description: burning  Aggravating factors:   Relieving factors:    PRECAUTIONS: Recent Surgery, right UE Lymphedema risk, None  ACTIVITY LEVEL / LEISURE: She goes to the gym and lifts weights and rides a stationary bike for 7 miles (30 min) several times per week   OBJECTIVE:   PATIENT SURVEYS:  QUICK DASH: 68.18% Breast Complaints: Pain in operated breast- 8 A feeling of heaviness in the operate breast-8 A swollen breast at the operated side-8 The skin feels tensed at the operated breast-8 Redness of the skin at the operated breast-10 A print of my bra is visible at the operated  breast-0 The pores of the skin at the operate breast are enlarged-4  The operated breast feels hard at some place- 8  OBSERVATIONS: Right breast has noticeable swelling with enlarged pores and nipple. The scar is healing as expected. Left breast had some hardening in the lower midline quadrant.   POSTURE:   Forward head and rounded shoulders  LYMPHEDEMA ASSESSMENT:     UPPER EXTREMITY AROM/PROM:   A/PROM Right 12/02/2021 Left 12/02/2021 Right 09/01/2022 Left  09/01/2022  Shoulder extension 47 49 60 60  Shoulder flexion 150 146 180 179  Shoulder abduction 157 160 171 167  Shoulder internal rotation 60 60 65 72  Shoulder external rotation 84 85 90 90                          (Blank rows = not tested)       CERVICAL AROM: All within normal limits   UPPER EXTREMITY STRENGTH: WNL     LYMPHEDEMA ASSESSMENTS:    LANDMARK RIGHT 12/02/2021 LEFT 12/02/2021 Right  09/01/2022 Left  09/01/2022  10 cm proximal to olecranon process 31.1 32.8 30 32  Olecranon process 26.3 26.7 25.8 25.8  10 cm proximal to ulnar styloid process 23.6 22.2 21.2 19.9  Just proximal to ulnar styloid process 16.4 16.3 15.6 16.2  Across hand at thumb web space 19.9 19.3 19.4 19  At base of 2nd digit 6.5 6._0 (Blank rows = not tested)   L-dex assessment  The patient was assessed using the L-Dex machine today to produce a lymphedema index baseline score. The patient will be reassessed on a regular basis (typically every 3 months) to obtain new L-Dex scores. If the score is > 6.5 points away from his/her baseline score indicating onset of subclinical lymphedema, it will be recommended to wear a compression garment for 4 weeks, 12 hours per day and then be reassessed. If the score continues to be > 6.5 points from baseline at reassessment, we will initiate lymphedema treatment. Assessing in this manner has a 95% rate of preventing clinically significant lymphedema.      Surgery type/Date: Right  Lumpectomy and Left benign Lumpectomy:05/05/2022 Number of lymph nodes removed: 2/2  Current/past treatment (chemo, radiation,  hormone therapy): radiation, chemo  Other symptoms:  Heaviness/tightness Yes Pain Yes Pitting edema No Infections No   PATIENT EDUCATION:  Education details: Right breast MLD, Exercise after surgery, Lymphedema  Person educated: Patient Education method: Explanation, Demonstration, and Handouts Education comprehension: verbalized understanding and returned demonstration  HOME EXERCISE PROGRAM: Self MLD for right breast   TODAY'S TREATMENT:  09/27/2022: In supine: Short neck, 5 diaphragmatic breaths, L axillary nodes and establishment of interaxillary pathway, R inguinal nodes and establishment of axilloinguinal pathway, then R breast moving fluid towards pathways spending extra time in any areas of fibrosis then retracing all steps.   09/13/2022: SOZO screening In supine: Short neck, 5 diaphragmatic breaths, L axillary nodes and establishment of interaxillary pathway, R inguinal nodes and establishment of axilloinguinal pathway, then R breast moving fluid towards pathways spending extra time in any areas of fibrosis then retracing all steps.   ASSESSMENT:  CLINICAL IMPRESSION: Patient responded well to therapy today. Patient had a little more swelling in the axilla and spent a more time in that area with the MLD. Patient states that she feels like the therapy is helping and the swelling is getting better. Patient would benefit from skilled therapy to work on the breast swelling and to monitor for lymphedema.  Pt will benefit from skilled therapeutic intervention to improve on the following deficits: Decreased knowledge of precautions, impaired UE functional use, pain, decreased ROM, postural dysfunction.   PT treatment/interventions: ADL/Self care home management, Therapeutic exercises, Therapeutic activity, Neuromuscular re-education, Balance training, Gait  training, Patient/Family education, Self Care, Joint mobilization, Dry Needling, Cryotherapy, Moist heat, Manual lymph drainage, scar mobilization, Taping, and Manual therapy   GOALS: Goals reviewed with patient? Yes  LONG TERM GOALS:  (STG=LTG)  GOALS Name Target Date  Goal status  1 Pt will demonstrate she has regained full shoulder ROM and function post operatively compared to baselines.  Baseline: 09/01/2022 MET  2  Pt will report a dash score of 4.55% to an increase in functional ability and reduction of pain. 11/24/2021 INITIAL  3 Pt will report 75% reduction of pain due to improvements in posture, strength, and muscle length  11/24/2021 INITIAL  4 Patient will be Independent with advance HEP 11/24/2021 INITIAL     PLAN:  PT FREQUENCY/DURATION: 1x every other week for 12 weeks   PLAN FOR NEXT SESSION:review self MLD,  breast MLD, add gentle stretching   [facility]  [address], [address]  [place] [address]  [phone]  After Breast Cancer Class It is recommended you attend the ABC class to be educated on lymphedema risk reduction. This class is free of charge and lasts for 1 hour. It is a 1-time class. You will need to download the Webex app either on your phone or computer. We will send you a link the night before or the morning of the class. You should be able to click on that link to join the class. This is not a confidential class. You don't have to turn your camera on, but other participants may be able to see your email address.  Scar massage You can begin gentle scar massage to you incision sites. Gently place one hand on the incision and move the skin (without sliding on the skin) in various directions. Do this for a few minutes and then you can gently massage either coconut oil or vitamin E cream into the scars.  Compression garment You should continue wearing your compression bra until you feel like  you no longer have swelling.  Home  exercise Program Continue doing the exercises you were given until you feel like you can do them without feeling any tightness at the end.   Walking Program Studies show that 30 minutes of walking per day (fast enough to elevate your heart rate) can significantly reduce the risk of a cancer recurrence. If you can't walk due to other medical reasons, we encourage you to find another activity you could do (like a stationary bike or water exercise).  Posture After breast cancer surgery, people frequently sit with rounded shoulders posture because it puts their incisions on slack and feels better. If you sit like this and scar tissue forms in that position, you can become very tight and have pain sitting or standing with good posture. Try to be aware of your posture and sit and stand up tall to heal properly.  Follow up PT: It is recommended you return every 3 months for the first 3 years following surgery to be assessed on the SOZO machine for an L-Dex score. This helps prevent clinically significant lymphedema in 95% of patients. These follow up screens are 10 minute appointments that you are not billed for.   Rosario Jacks, Student-PT 09/27/2022  10:48 AM

## 2022-10-05 DIAGNOSIS — R002 Palpitations: Secondary | ICD-10-CM

## 2022-10-07 ENCOUNTER — Telehealth: Payer: Self-pay | Admitting: Hematology and Oncology

## 2022-10-07 NOTE — Telephone Encounter (Signed)
Scheduled appointment per WQ. Left voicemail. 

## 2022-10-11 ENCOUNTER — Encounter: Payer: Self-pay | Admitting: Rehabilitation

## 2022-10-11 ENCOUNTER — Ambulatory Visit: Payer: BC Managed Care – PPO | Attending: Radiation Oncology | Admitting: Rehabilitation

## 2022-10-11 DIAGNOSIS — M79601 Pain in right arm: Secondary | ICD-10-CM | POA: Diagnosis not present

## 2022-10-11 DIAGNOSIS — R293 Abnormal posture: Secondary | ICD-10-CM | POA: Diagnosis not present

## 2022-10-11 DIAGNOSIS — C50411 Malignant neoplasm of upper-outer quadrant of right female breast: Secondary | ICD-10-CM | POA: Diagnosis not present

## 2022-10-11 DIAGNOSIS — R601 Generalized edema: Secondary | ICD-10-CM

## 2022-10-11 DIAGNOSIS — Z170 Estrogen receptor positive status [ER+]: Secondary | ICD-10-CM | POA: Insufficient documentation

## 2022-10-11 DIAGNOSIS — M6281 Muscle weakness (generalized): Secondary | ICD-10-CM | POA: Diagnosis not present

## 2022-10-11 NOTE — Therapy (Signed)
[facility] OUTPATIENT PHYSICAL THERAPY BREAST CANCER POST OP FOLLOW UP   Patient Name: Julie Atkinson MRN: [id number] DOB:[birth year], 51 y.o., female Today's Date: 10/11/2022   PT End of Session - 10/11/22 0958     Visit Number 4    Number of Visits 6    Date for PT Re-Evaluation 11/24/22    PT Start Time 0954    PT Stop Time 1054    PT Time Calculation (min) 60 min    Activity Tolerance Patient tolerated treatment well    Behavior During Therapy [facility] for tasks assessed/performed                Past Medical History:  Diagnosis Date   Depression    Family history of breast cancer 12/04/2021   Family history of ovarian cancer 12/04/2021   Fibroid 2014-01-12   8 mm   History of posttraumatic stress disorder (PTSD) 01/13/08   due to maternal death-MI   History of radiation therapy    Right breast- 07/15/22-08/23/22- Dr. Gery Pray   PTSD (post-traumatic stress disorder)    mother's death 01-13-2008; seizure and AMI while driving, patient performed CPR x 45 minutes   Seropositive for herpes simplex 2 infection 01/13/2016   Past Surgical History:  Procedure Laterality Date   BLADDER SURGERY  1978   nocturnal enuresis treatment   CESAREAN SECTION  01/12/06   Dr Quincy Simmonds   DILATATION & CURETTAGE/HYSTEROSCOPY WITH MYOSURE N/A 01/06/2016   Procedure: DILATATION & CURETTAGE/HYSTEROSCOPY WITH Jacklynn Barnacle;  Surgeon: Nunzio Cobbs, MD;  Location: [facility];  Service: Gynecology;  Laterality: N/A;   DILATION AND CURETTAGE OF UTERUS  1990   MAB?   PORTACATH PLACEMENT Left 12/15/2021   Procedure: INSERTION PORT-A-CATH;  Surgeon: Coralie Keens, MD;  Location: [facility];  Service: General;  Laterality: Left;   Patient Active Problem List   Diagnosis Date Noted   Port-A-Cath in place 12/16/2021   Family history of breast cancer 12/04/2021   Family history of ovarian cancer 12/04/2021   Genetic testing 12/04/2021   Malignant neoplasm of upper-outer quadrant of right female breast ([place])  12/01/2021   Breast lump on right side at 10 o'clock position 10/21/2021   PTSD (post-traumatic stress disorder)     PCP: [name], NP  REFERRING PROVIDER: Coralie Keens, MD  REFERRING DIAG: Right breast Cancer   THERAPY DIAG:  Muscle weakness (generalized)  Pain in right arm  Abnormal posture  Malignant neoplasm of upper-outer quadrant of right breast in female, estrogen receptor positive ([place])  Generalized edema  Rationale for Evaluation and Treatment Rehabilitation  ONSET DATE: 11/17/2021  SUBJECTIVE:  SUBJECTIVE STATEMENT: She is having some right breast tenderness. She is having trouble with the self MLD PERTINENT HISTORY:  Patient was diagnosed on 11/17/2021 with right grade grade II invasive ductal and lobular carcinoma breast cancer.. It measures 2.4 cm and is located in the upper outer quadrant. It is triple positive with a Ki67 of 15%.    05/24/2022- bilateral breast reduction   PATIENT GOALS:  Reassess how my recovery is going related to arm function, pain, and swelling.  PAIN:  Evaluation: Are you having pain? Yes: NPRS scale: 6/10 Pain location: on right breast and side Pain description: burning  Aggravating factors:   Relieving factors:    PRECAUTIONS: Recent Surgery, right UE Lymphedema risk, None  ACTIVITY LEVEL / LEISURE: She goes to the gym and lifts weights and rides a stationary bike for 7 miles (30 min) several times per week   OBJECTIVE:   PATIENT SURVEYS:  QUICK DASH: 68.18% Breast Complaints: Pain in operated breast- 8 A feeling of heaviness in the operate breast-8 A swollen breast at the operated side-8 The skin feels tensed at the operated breast-8 Redness of the skin at the operated breast-10 A print of my bra is visible at the operated  breast-0 The pores of the skin at the operate breast are enlarged-4  The operated breast feels hard at some place- 8  OBSERVATIONS: Right breast has noticeable swelling with enlarged pores and nipple. The scar is healing as expected. Left breast had some hardening in the lower midline quadrant.   POSTURE:   Forward head and rounded shoulders  LYMPHEDEMA ASSESSMENT:     UPPER EXTREMITY AROM/PROM:   A/PROM Right 12/02/2021 Left 12/02/2021 Right 09/01/2022 Left  09/01/2022  Shoulder extension 47 49 60 60  Shoulder flexion 150 146 180 179  Shoulder abduction 157 160 171 167  Shoulder internal rotation 60 60 65 72  Shoulder external rotation 84 85 90 90                          (Blank rows = not tested)       CERVICAL AROM: All within normal limits   UPPER EXTREMITY STRENGTH: WNL     LYMPHEDEMA ASSESSMENTS:    LANDMARK RIGHT 12/02/2021 LEFT 12/02/2021 Right  09/01/2022 Left  09/01/2022  10 cm proximal to olecranon process 31.1 32.8 30 32  Olecranon process 26.3 26.7 25.8 25.8  10 cm proximal to ulnar styloid process 23.6 22.2 21.2 19.9  Just proximal to ulnar styloid process 16.4 16.3 15.6 16.2  Across hand at thumb web space 19.9 19.3 19.4 19  At base of 2nd digit 6.5 6._0 (Blank rows = not tested)   L-dex assessment  The patient was assessed using the L-Dex machine today to produce a lymphedema index baseline score. The patient will be reassessed on a regular basis (typically every 3 months) to obtain new L-Dex scores. If the score is > 6.5 points away from his/her baseline score indicating onset of subclinical lymphedema, it will be recommended to wear a compression garment for 4 weeks, 12 hours per day and then be reassessed. If the score continues to be > 6.5 points from baseline at reassessment, we will initiate lymphedema treatment. Assessing in this manner has a 95% rate of preventing clinically significant lymphedema.      Surgery type/Date: Right  Lumpectomy and Left benign Lumpectomy:05/05/2022 Number of lymph nodes removed: 2/2  Current/past treatment (chemo, radiation, hormone therapy): radiation,  chemo  Other symptoms:  Heaviness/tightness Yes Pain Yes Pitting edema No Infections No   PATIENT EDUCATION:  Education details: Right breast MLD, Exercise after surgery, Lymphedema  Person educated: Patient Education method: Explanation, Demonstration, and Handouts Education comprehension: verbalized understanding and returned demonstration  HOME EXERCISE PROGRAM: Self MLD for right breast   TODAY'S TREATMENT:  10/11/2022: Exercise:Pulleys- flexion and abduction  AAROM ball flexion 1x10 Manual: In supine: Short neck, 5 diaphragmatic breaths, L axillary nodes and establishment of interaxillary pathway, R inguinal nodes and establishment of axilloinguinal pathway, then R breast moving fluid towards pathways spending extra time in any areas of fibrosis then retracing all steps. Working in side lying along the posterior interaxillary pathways.  Review of breast MLD  09/27/2022: In supine: Short neck, 5 diaphragmatic breaths, L axillary nodes and establishment of interaxillary pathway, R inguinal nodes and establishment of axilloinguinal pathway, then R breast moving fluid towards pathways spending extra time in any areas of fibrosis then retracing all steps.   09/13/2022: SOZO screening In supine: Short neck, 5 diaphragmatic breaths, L axillary nodes and establishment of interaxillary pathway, R inguinal nodes and establishment of axilloinguinal pathway, then R breast moving fluid towards pathways spending extra time in any areas of fibrosis then retracing all steps.   ASSESSMENT:  CLINICAL IMPRESSION: Patient responded well to therapy today. Patient had a little more swelling and tenderness in the axilla and spent a more time in that area with the MLD. Patient states that she feels like the therapy is helping and the swelling is  getting better and she can tell a difference after she leaves. Today we review the self breast MLD briefly and will review more indept next session. Patient would benefit from skilled therapy to work on the breast swelling and to monitor for lymphedema.  Pt will benefit from skilled therapeutic intervention to improve on the following deficits: Decreased knowledge of precautions, impaired UE functional use, pain, decreased ROM, postural dysfunction.   PT treatment/interventions: ADL/Self care home management, Therapeutic exercises, Therapeutic activity, Neuromuscular re-education, Balance training, Gait training, Patient/Family education, Self Care, Joint mobilization, Dry Needling, Cryotherapy, Moist heat, Manual lymph drainage, scar mobilization, Taping, and Manual therapy   GOALS: Goals reviewed with patient? Yes  LONG TERM GOALS:  (STG=LTG)  GOALS Name Target Date  Goal status  1 Pt will demonstrate she has regained full shoulder ROM and function post operatively compared to baselines.  Baseline: 09/01/2022 MET  2  Pt will report a dash score of 4.55% to an increase in functional ability and reduction of pain. 11/24/2021 INITIAL  3 Pt will report 75% reduction of pain due to improvements in posture, strength, and muscle length  11/24/2021 INITIAL  4 Patient will be Independent with advance HEP 11/24/2021 INITIAL     PLAN:  PT FREQUENCY/DURATION: 1x every other week for 12 weeks   PLAN FOR NEXT SESSION:review self MLD,  breast MLD, add gentle stretching   [facility]  [address], [address]  [place] [address]  [phone]  After Breast Cancer Class It is recommended you attend the ABC class to be educated on lymphedema risk reduction. This class is free of charge and lasts for 1 hour. It is a 1-time class. You will need to download the Webex app either on your phone or computer. We will send you a link the night before or the morning of the class. You  should be able to click on that link to join the class. This  is not a confidential class. You don't have to turn your camera on, but other participants may be able to see your email address.  Scar massage You can begin gentle scar massage to you incision sites. Gently place one hand on the incision and move the skin (without sliding on the skin) in various directions. Do this for a few minutes and then you can gently massage either coconut oil or vitamin E cream into the scars.  Compression garment You should continue wearing your compression bra until you feel like you no longer have swelling.  Home exercise Program Continue doing the exercises you were given until you feel like you can do them without feeling any tightness at the end.   Walking Program Studies show that 30 minutes of walking per day (fast enough to elevate your heart rate) can significantly reduce the risk of a cancer recurrence. If you can't walk due to other medical reasons, we encourage you to find another activity you could do (like a stationary bike or water exercise).  Posture After breast cancer surgery, people frequently sit with rounded shoulders posture because it puts their incisions on slack and feels better. If you sit like this and scar tissue forms in that position, you can become very tight and have pain sitting or standing with good posture. Try to be aware of your posture and sit and stand up tall to heal properly.  Follow up PT: It is recommended you return every 3 months for the first 3 years following surgery to be assessed on the SOZO machine for an L-Dex score. This helps prevent clinically significant lymphedema in 95% of patients. These follow up screens are 10 minute appointments that you are not billed for.   Rosario Jacks, Student-PT 10/11/2022  10:59 AM

## 2022-10-12 ENCOUNTER — Encounter: Payer: BC Managed Care – PPO | Admitting: Registered Nurse

## 2022-10-14 ENCOUNTER — Inpatient Hospital Stay: Payer: BC Managed Care – PPO | Attending: Hematology and Oncology

## 2022-10-14 VITALS — BP 129/89 | HR 80 | Temp 98.5°F | Resp 18 | Wt 186.0 lb

## 2022-10-14 DIAGNOSIS — C50411 Malignant neoplasm of upper-outer quadrant of right female breast: Secondary | ICD-10-CM | POA: Diagnosis not present

## 2022-10-14 DIAGNOSIS — Z79899 Other long term (current) drug therapy: Secondary | ICD-10-CM | POA: Diagnosis not present

## 2022-10-14 DIAGNOSIS — Z923 Personal history of irradiation: Secondary | ICD-10-CM | POA: Insufficient documentation

## 2022-10-14 DIAGNOSIS — Z5112 Encounter for antineoplastic immunotherapy: Secondary | ICD-10-CM | POA: Diagnosis not present

## 2022-10-14 DIAGNOSIS — Z87891 Personal history of nicotine dependence: Secondary | ICD-10-CM | POA: Insufficient documentation

## 2022-10-14 DIAGNOSIS — Z170 Estrogen receptor positive status [ER+]: Secondary | ICD-10-CM | POA: Insufficient documentation

## 2022-10-14 DIAGNOSIS — Z9221 Personal history of antineoplastic chemotherapy: Secondary | ICD-10-CM | POA: Insufficient documentation

## 2022-10-14 DIAGNOSIS — Z79810 Long term (current) use of selective estrogen receptor modulators (SERMs): Secondary | ICD-10-CM | POA: Insufficient documentation

## 2022-10-14 MED ORDER — SODIUM CHLORIDE 0.9 % IV SOLN: Freq: Once | INTRAVENOUS | Status: AC

## 2022-10-14 MED ORDER — SODIUM CHLORIDE 0.9% FLUSH
10.0000 mL | INTRAVENOUS | Status: DC | PRN
  Administered 2022-10-14: 10 mL

## 2022-10-14 MED ORDER — TRASTUZUMAB-DKST CHEMO 150 MG IV SOLR
6.0000 mg/kg | Freq: Once | INTRAVENOUS | Status: AC
  Administered 2022-10-14: 483 mg via INTRAVENOUS
  Filled 2022-10-14: qty 23

## 2022-10-14 MED ORDER — ACETAMINOPHEN 325 MG PO TABS
650.0000 mg | ORAL_TABLET | Freq: Once | ORAL | Status: AC
  Administered 2022-10-14: 650 mg via ORAL
  Filled 2022-10-14: qty 2

## 2022-10-14 MED ORDER — LORATADINE 10 MG PO TABS
10.0000 mg | ORAL_TABLET | Freq: Once | ORAL | Status: AC
  Administered 2022-10-14: 10 mg via ORAL
  Filled 2022-10-14: qty 1

## 2022-10-14 MED ORDER — HEPARIN SOD (PORK) LOCK FLUSH 100 UNIT/ML IV SOLN
500.0000 [IU] | Freq: Once | INTRAVENOUS | Status: AC | PRN
  Administered 2022-10-14: 500 [IU]

## 2022-10-14 NOTE — Patient Instructions (Signed)
[facility]  Discharge Instructions: Thank you for choosing [facility] to provide your oncology and hematology care.   If you have a lab appointment with the [facility], please go directly to the [facility] and check in at the registration area.   Wear comfortable clothing and clothing appropriate for easy access to any Portacath or PICC line.   We strive to give you quality time with your provider. You may need to reschedule your appointment if you arrive late (15 or more minutes).  Arriving late affects you and other patients whose appointments are after yours.  Also, if you miss three or more appointments without notifying the office, you may be dismissed from the clinic at the provider's discretion.      For prescription refill requests, have your pharmacy contact our office and allow 72 hours for refills to be completed.    Today you received the following chemotherapy and/or immunotherapy agents: [id number]      To help prevent nausea and vomiting after your treatment, we encourage you to take your nausea medication as directed.  BELOW ARE SYMPTOMS THAT SHOULD BE REPORTED IMMEDIATELY: *FEVER GREATER THAN 100.4 F (38 C) OR HIGHER *CHILLS OR SWEATING *NAUSEA AND VOMITING THAT IS NOT CONTROLLED WITH YOUR NAUSEA MEDICATION *UNUSUAL SHORTNESS OF BREATH *UNUSUAL BRUISING OR BLEEDING *URINARY PROBLEMS (pain or burning when urinating, or frequent urination) *BOWEL PROBLEMS (unusual diarrhea, constipation, pain near the anus) TENDERNESS IN MOUTH AND THROAT WITH OR WITHOUT PRESENCE OF ULCERS (sore throat, sores in mouth, or a toothache) UNUSUAL RASH, SWELLING OR PAIN  UNUSUAL VAGINAL DISCHARGE OR ITCHING   Items with * indicate a potential emergency and should be followed up as soon as possible or go to the Emergency Department if any problems should occur.  Please show the CHEMOTHERAPY ALERT CARD or IMMUNOTHERAPY ALERT CARD at  check-in to the Emergency Department and triage nurse.  Should you have questions after your visit or need to cancel or reschedule your appointment, please contact [facility]  Dept: [phone]  and follow the prompts.  Office hours are 8:00 a.m. to 4:30 p.m. Monday - Friday. Please note that voicemails left after 4:00 p.m. may not be returned until the following business day.  We are closed weekends and major holidays. You have access to a nurse at all times for urgent questions. Please call the main number to the clinic Dept: [phone] and follow the prompts.   For any non-urgent questions, you may also contact your provider using MyChart. We now offer e-Visits for anyone 50 and older to request care online for non-urgent symptoms. For details visit [email or web address].   Also download the MyChart app! Go to the app store, search "MyChart", open the app, select [facility], and log in with your MyChart username and password.  Masks are optional in the cancer centers. If you would like for your care team to wear a mask while they are taking care of you, please let them know. You may have one support person who is at least 52 years old accompany you for your appointments.

## 2022-10-19 DIAGNOSIS — R002 Palpitations: Secondary | ICD-10-CM | POA: Diagnosis not present

## 2022-10-21 ENCOUNTER — Encounter: Payer: Self-pay | Admitting: Internal Medicine

## 2022-10-22 DIAGNOSIS — C50912 Malignant neoplasm of unspecified site of left female breast: Secondary | ICD-10-CM | POA: Diagnosis not present

## 2022-10-22 DIAGNOSIS — C50911 Malignant neoplasm of unspecified site of right female breast: Secondary | ICD-10-CM | POA: Diagnosis not present

## 2022-10-25 ENCOUNTER — Encounter: Payer: Self-pay | Admitting: Rehabilitation

## 2022-10-25 ENCOUNTER — Ambulatory Visit: Payer: BC Managed Care – PPO | Admitting: Rehabilitation

## 2022-10-25 DIAGNOSIS — R293 Abnormal posture: Secondary | ICD-10-CM | POA: Diagnosis not present

## 2022-10-25 DIAGNOSIS — M6281 Muscle weakness (generalized): Secondary | ICD-10-CM

## 2022-10-25 DIAGNOSIS — R601 Generalized edema: Secondary | ICD-10-CM | POA: Diagnosis not present

## 2022-10-25 DIAGNOSIS — Z170 Estrogen receptor positive status [ER+]: Secondary | ICD-10-CM | POA: Diagnosis not present

## 2022-10-25 DIAGNOSIS — M79601 Pain in right arm: Secondary | ICD-10-CM

## 2022-10-25 DIAGNOSIS — C50411 Malignant neoplasm of upper-outer quadrant of right female breast: Secondary | ICD-10-CM

## 2022-10-25 NOTE — Therapy (Signed)
[facility] OUTPATIENT PHYSICAL THERAPY BREAST CANCER TREATMENT   Patient Name: Julie Atkinson MRN: [id number] DOB:July 09, 1971, 51 y.o., female Today's Date: 10/25/2022   PT End of Session - 10/25/22 0947     Visit Number 5    Number of Visits 6    Date for PT Re-Evaluation 12/06/22    PT Start Time 0957    PT Stop Time 1050    PT Time Calculation (min) 53 min    Activity Tolerance Patient tolerated treatment well    Behavior During Therapy [facility] for tasks assessed/performed                Past Medical History:  Diagnosis Date   Depression    Family history of breast cancer 12/04/2021   Family history of ovarian cancer 12/04/2021   Fibroid January 07, 2014   8 mm   History of posttraumatic stress disorder (PTSD) January 08, 2008   due to maternal death-MI   History of radiation therapy    Right breast- 07/15/22-08/23/22- Dr. Gery Pray   PTSD (post-traumatic stress disorder)    mother's death January 08, 2008; seizure and AMI while driving, patient performed CPR x 45 minutes   Seropositive for herpes simplex 2 infection Jan 08, 2016   Past Surgical History:  Procedure Laterality Date   BLADDER SURGERY  1978   nocturnal enuresis treatment   CESAREAN SECTION  01/07/2006   Dr Quincy [facility]   DILATATION & CURETTAGE/HYSTEROSCOPY WITH MYOSURE N/A 01/06/2016   Procedure: DILATATION & CURETTAGE/HYSTEROSCOPY WITH Jacklynn Barnacle;  Surgeon: Nunzio Cobbs, MD;  Location: [facility];  Service: Gynecology;  Laterality: N/A;   DILATION AND CURETTAGE OF UTERUS  1990   MAB?   PORTACATH PLACEMENT Left 12/15/2021   Procedure: INSERTION PORT-A-CATH;  Surgeon: Coralie Keens, MD;  Location: [place];  Service: General;  Laterality: Left;   Patient Active Problem List   Diagnosis Date Noted   Port-A-Cath in place 12/16/2021   Family history of breast cancer 12/04/2021   Family history of ovarian cancer 12/04/2021   Genetic testing 12/04/2021   Malignant neoplasm of upper-outer quadrant of right female breast ([place]) 12/01/2021    Breast lump on right side at 10 o'clock position 10/21/2021   PTSD (post-traumatic stress disorder)     PCP: [name], NP  REFERRING PROVIDER: Coralie Keens, MD  REFERRING DIAG: Right breast Cancer   THERAPY DIAG:  Muscle weakness (generalized)  Abnormal posture  Pain in right arm  Malignant neoplasm of upper-outer quadrant of right breast in female, estrogen receptor positive ([place])  Generalized edema  Rationale for Evaluation and Treatment Rehabilitation  ONSET DATE: 11/17/2021  SUBJECTIVE:  SUBJECTIVE STATEMENT: It has really been feeling bad this week.  I feel like it is getting more and not less.  I am doing my compression bra and massage every day.    PERTINENT HISTORY:  Patient was diagnosed on 11/17/2021 with right grade grade II invasive ductal and lobular carcinoma breast cancer.. It measures 2.4 cm and is located in the upper outer quadrant. It is triple positive with a Ki67 of 15%. Radiation completed end of October.     05/24/2022- bilateral breast reduction   PATIENT GOALS:  Reassess how my recovery is going related to arm function, pain, and swelling.  PAIN:  Evaluation: Are you having pain? Yes: NPRS scale: 7/10 Pain location: on right breast and side Pain description: burning  Aggravating factors: nothing  Relieving factors: PT visits  PRECAUTIONS: Recent Surgery, right UE Lymphedema risk, None  ACTIVITY LEVEL / LEISURE: She goes to the gym and lifts weights and rides a stationary bike for 7 miles (30 min) several times per week   OBJECTIVE:   PATIENT SURVEYS:   Breast Complaints: Pain in operated breast- 8 A feeling of heaviness in the operate breast-9 A swollen breast at the operated side-9 The skin feels tensed at the operated breast-10 Redness of  the skin at the operated breast-6 A print of my bra is visible at the operated breast-0 The pores of the skin at the operate breast are enlarged-4  The operated breast feels hard at some place- 10 56/80 on 10/25/22  OBSERVATIONS: Right breast has noticeable swelling with enlarged pores and nipple. The scar is healing as expected. Left breast had some hardening in the lower midline quadrant.   POSTURE:   Forward head and rounded shoulders  LYMPHEDEMA ASSESSMENT:     UPPER EXTREMITY AROM/PROM:   A/PROM Right 12/02/2021 Left 12/02/2021 Right 09/01/2022 Left  09/01/2022  Shoulder extension 47 49 60 60  Shoulder flexion 150 146 180 179  Shoulder abduction 157 160 171 167  Shoulder internal rotation 60 60 65 72  Shoulder external rotation 84 85 90 90                          (Blank rows = not tested)       CERVICAL AROM: All within normal limits   UPPER EXTREMITY STRENGTH: WNL     LYMPHEDEMA ASSESSMENTS:    LANDMARK RIGHT 12/02/2021 LEFT 12/02/2021 Right  09/01/2022 Left  09/01/2022  10 cm proximal to olecranon process 31.1 32.8 30 32  Olecranon process 26.3 26.7 25.8 25.8  10 cm proximal to ulnar styloid process 23.6 22.2 21.2 19.9  Just proximal to ulnar styloid process 16.4 16.3 15.6 16.2  Across hand at thumb web space 19.9 19.3 19.4 19  At base of 2nd digit 6.5 6._0 (Blank rows = not tested)   Surgery type/Date: Right Lumpectomy and Left benign Lumpectomy:05/05/2022 Number of lymph nodes removed: 2/2  Current/past treatment (chemo, radiation, hormone therapy): radiation, chemo  Other symptoms:  Heaviness/tightness Yes Pain Yes Pitting edema No Infections No  PATIENT EDUCATION:  Education details: Right breast MLD, Exercise after surgery, Lymphedema  Person educated: Patient Education method: Explanation, Demonstration, and Handouts Education comprehension: verbalized understanding and returned demonstration  HOME EXERCISE PROGRAM: Self MLD for right  breast   TODAY'S TREATMENT: 10/25/2022: Manual: In supine: Short neck, 5 diaphragmatic breaths, L axillary nodes and establishment of interaxillary pathway, R inguinal nodes and establishment of axilloinguinal pathway, then R breast  moving fluid towards pathways spending extra time in any areas of fibrosis then retracing all steps. Working in side lying along the posterior interaxillary pathways.  Review of breast MLD pressures as pt shows demo a bit heavy handed.   Will extend POC due to improvements of breast status with in person visits.   10/11/2022: Exercise:Pulleys- flexion and abduction  AAROM ball flexion 1x10 Manual: In supine: Short neck, 5 diaphragmatic breaths, L axillary nodes and establishment of interaxillary pathway, R inguinal nodes and establishment of axilloinguinal pathway, then R breast moving fluid towards pathways spending extra time in any areas of fibrosis then retracing all steps. Working in side lying along the posterior interaxillary pathways.  Review of breast MLD  09/27/2022: In supine: Short neck, 5 diaphragmatic breaths, L axillary nodes and establishment of interaxillary pathway, R inguinal nodes and establishment of axilloinguinal pathway, then R breast moving fluid towards pathways spending extra time in any areas of fibrosis then retracing all steps.   09/13/2022: SOZO screening In supine: Short neck, 5 diaphragmatic breaths, L axillary nodes and establishment of interaxillary pathway, R inguinal nodes and establishment of axilloinguinal pathway, then R breast moving fluid towards pathways spending extra time in any areas of fibrosis then retracing all steps.   ASSESSMENT: CLINICAL IMPRESSION: Pt shows no improvements in BCQ so far and feels improvements with PT visits but not with self massage.  Reviewed scar and self massage, compression, and HEP.  Pt is doing everything very well except maybe a bit heavy handed with MLD.  Pt will extend visits 1-2 x per  week up to 6 weeks.   Pt will benefit from skilled therapeutic intervention to improve on the following deficits: Decreased knowledge of precautions, impaired UE functional use, pain, decreased ROM, postural dysfunction.   PT treatment/interventions: ADL/Self care home management, Therapeutic exercises, Therapeutic activity, Neuromuscular re-education, Balance training, Gait training, Patient/Family education, Self Care, Joint mobilization, Dry Needling, Cryotherapy, Moist heat, Manual lymph drainage, scar mobilization, Taping, and Manual therapy   GOALS: Goals reviewed with patient? Yes  LONG TERM GOALS:  (STG=LTG)  GOALS Name Target Date  Goal status  1 Pt will demonstrate she has regained full shoulder ROM and function post operatively compared to baselines.  Baseline: 09/01/2022 MET  2  Pt will report a dash score of 4.55% to an increase in functional ability and reduction of pain. 11/24/2021 INITIAL  3 Pt will report 75% reduction of pain due to improvements in posture, strength, and muscle length  11/24/2021 INITIAL  4 Patient will be Independent with advance HEP 11/24/2021 INITIAL     PLAN:  PT FREQUENCY/DURATION: 1-2x per week x 6 weeks   PLAN FOR NEXT SESSION:review self MLD, scar work,  breast MLD, gentle stretching   [facility]  [address], [address]  [place] [address]  [phone]  After Breast Cancer Class It is recommended you attend the ABC class to be educated on lymphedema risk reduction. This class is free of charge and lasts for 1 hour. It is a 1-time class. You will need to download the Webex app either on your phone or computer. We will send you a link the night before or the morning of the class. You should be able to click on that link to join the class. This is not a confidential class. You don't have to turn your camera on, but other participants may be able to see your email address.  Scar massage You can begin gentle scar  massage to you incision sites. Gently place one hand on the incision and move the skin (without sliding on the skin) in various directions. Do this for a few minutes and then you can gently massage either coconut oil or vitamin E cream into the scars.  Compression garment You should continue wearing your compression bra until you feel like you no longer have swelling.  Home exercise Program Continue doing the exercises you were given until you feel like you can do them without feeling any tightness at the end.   Walking Program Studies show that 30 minutes of walking per day (fast enough to elevate your heart rate) can significantly reduce the risk of a cancer recurrence. If you can't walk due to other medical reasons, we encourage you to find another activity you could do (like a stationary bike or water exercise).  Posture After breast cancer surgery, people frequently sit with rounded shoulders posture because it puts their incisions on slack and feels better. If you sit like this and scar tissue forms in that position, you can become very tight and have pain sitting or standing with good posture. Try to be aware of your posture and sit and stand up tall to heal properly.  Follow up PT: It is recommended you return every 3 months for the first 3 years following surgery to be assessed on the SOZO machine for an L-Dex score. This helps prevent clinically significant lymphedema in 95% of patients. These follow up screens are 10 minute appointments that you are not billed for.   Shan Levans, PT  10/25/2022  10:57 AM

## 2022-11-02 NOTE — Progress Notes (Signed)
Julie Atkinson Cancer Follow up:    Pcp, No No address on file   DIAGNOSIS:  Cancer Staging  Malignant neoplasm of upper-outer quadrant of right female breast [facility]) Staging form: Breast, AJCC 8th Edition - Clinical stage from 12/02/2021: Stage IB (cT2, cN0, cM0, G2, ER+, PR+, HER2+) - Signed by Nicholas Lose, MD on 12/02/2021 Stage prefix: Initial diagnosis Histologic grading system: 3 grade system   SUMMARY OF ONCOLOGIC HISTORY: Oncology History  Malignant neoplasm of upper-outer quadrant of right female breast ([place])  11/19/2021 Initial Diagnosis   Palpable lump for 3 weeks, diagnostic mammogram: 2.2 cm x 2.2 cm irregular mass with a spiculated margin in the upper outer right breast. 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 HER2 positive, Ki-67 15%   12/02/2021 Cancer Staging   Staging form: Breast, AJCC 8th Edition - Clinical stage from 12/02/2021: Stage IB (cT2, cN0, cM0, G2, ER+, PR+, HER2+) - Signed by Nicholas Lose, MD on 12/02/2021 Stage prefix: Initial diagnosis Histologic grading system: 3 grade system   12/16/2021 - 05/12/2022 Chemotherapy   Patient is on Treatment Plan : BREAST  Docetaxel + Carboplatin + Trastuzumab + Pertuzumab  (TCHP) q21d      01/22/2022 Genetic Testing   Negative hereditary cancer genetic testing: no pathogenic variants detected in [place] +RNAinsight Panel.  Report date is 01/22/2022.   The CancerNext gene panel offered by [employer] includes sequencing, rearrangement analysis, and RNA analysis for the following 36 genes:   APC, ATM, AXIN2, BARD1, BMPR1A, BRCA1, BRCA2, BRIP1, CDH1, CDK4, CDKN2A, CHEK2, DICER1, HOXB13, EPCAM, GREM1, MLH1, MSH2, MSH3, MSH6, MUTYH, NBN, NF1, NTHL1, PALB2, PMS2, POLD1, POLE, PTEN, RAD51C, RAD51D, RECQL, SMAD4, SMARCA4, STK11, and TP53.    04/22/2022 -  Chemotherapy   Patient is on Treatment Plan : BREAST MAINTENANCE Trastuzumab IV (6) or SQ (600) D1 q21d X 11 Cycles     05/05/2022 Surgery   Rt  retroareolar lumpectomy and Rt UOQ lumpectomy: No residual cancer, Lt Lumpectomy: Benign Complete Pathological Response   06/04/2022 - 07/02/2022 Chemotherapy   Patient is on Treatment Plan : BREAST Trastuzumab  + Pertuzumab q21d x 13 cycles     07/16/2022 - 08/23/2022 Radiation Therapy   50.4 Gy in 28 treatments to Right Breast   09/2022 -  Anti-estrogen oral therapy   Tamoxifen x 5-10 years     CURRENT THERAPY: Herceptin; Tamoxifen  INTERVAL HISTORY: Julie Atkinson 51 y.o. female returns for f/u on treatment with herceptin/perjeta given every 3 weeks to complete one year of HER2 therapy and Tamoxifen oral therapy daily.  She has not yet started Tamoxifen.    Julie Atkinson underwent echocardiogram on 09/15/2022 that demonstrated a normal EF of 55%.  She is experiencing bilateral lower extremity swelling that gets worse as the day goes on.  She is hoping for remedies to help improve this.  She says that with the swelling she also is experiencing pain in her legs and the feeling as if her legs or not her own.   Patient Active Problem List   Diagnosis Date Noted   Port-A-Cath in place 12/16/2021   Family history of breast cancer 12/04/2021   Family history of ovarian cancer 12/04/2021   Genetic testing 12/04/2021   Malignant neoplasm of upper-outer quadrant of right female breast ([place]) 12/01/2021   Breast lump on right side at 10 o'clock position 10/21/2021   PTSD (post-traumatic stress disorder)     is allergic to iodinated contrast media, latex, and codeine.  MEDICAL  HISTORY: Past Medical History:  Diagnosis Date   Depression    Family history of breast cancer 12/04/2021   Family history of ovarian cancer 12/04/2021   Fibroid Jan 08, 2014   8 mm   History of posttraumatic stress disorder (PTSD) 01/09/08   due to maternal death-MI   History of radiation therapy    Right breast- 07/15/22-08/23/22- Dr. Gery Pray   PTSD (post-traumatic stress disorder)    mother's death 01-09-2008; seizure and AMI  while driving, patient performed CPR x 45 minutes   Seropositive for herpes simplex 2 infection January 09, 2016    SURGICAL HISTORY: Past Surgical History:  Procedure Laterality Date   BLADDER SURGERY  1978   nocturnal enuresis treatment   CESAREAN SECTION  2006/01/08   Dr Quincy Sim[facility]   DILATATION & CURETTAGE/HYSTEROSCOPY WITH MYOSURE N/A 01/06/2016   Procedure: DILATATION & CURETTAGE/HYSTEROSCOPY WITH Jacklynn Barnacle;  Surgeon: Nunzio Cobbs, MD;  Location: [facility];  Service: Gynecology;  Laterality: N/A;   DILATION AND CURETTAGE OF UTERUS  1990   MAB?   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   Lives with her son.   Lost her job suddenly 08/20/2016 with job cuts at work.   Social Determinants of Health   Financial Resource Strain: Not on file  Food Insecurity: Not on file  Transportation Needs: Not on file  Physical Activity: Not on file  Stress: Not on file  Social Connections: Not on file  Intimate Partner Violence: Not on file    FAMILY HISTORY: Family  History  Problem Relation Age of Onset   Pulmonary disease Mother    Heart disease Mother    Cervical cancer Mother 28   Breast cancer Maternal Aunt        dx late 43s   Lung cancer Maternal Uncle        d. 24   Bone cancer Paternal Grandmother        primary? limited info; d. 6s   Breast cancer Cousin 56       maternal female cousin   Ovarian cancer Cousin        maternal cousin; d. 8   Cancer Other        cervical or other GYN cancer in several maternal relatives    Review of Systems  Constitutional:  Negative for appetite change, chills, fatigue, fever and unexpected weight change.  HENT:   Negative for hearing loss,  lump/mass and trouble swallowing.   Eyes:  Negative for eye problems and icterus.  Respiratory:  Negative for chest tightness, cough and shortness of breath.   Cardiovascular:  Positive for leg swelling. Negative for chest pain and palpitations.  Gastrointestinal:  Negative for abdominal distention, abdominal pain, constipation, diarrhea, nausea and vomiting.  Endocrine: Negative for hot flashes.  Genitourinary:  Negative for difficulty urinating.   Musculoskeletal:  Negative for arthralgias.  Skin:  Negative for itching and rash.  Neurological:  Negative for dizziness, extremity weakness, headaches and numbness.  Hematological:  Negative for adenopathy. Does not bruise/bleed easily.  Psychiatric/Behavioral:  Negative for depression. The patient is not nervous/anxious.       PHYSICAL EXAMINATION  ECOG PERFORMANCE STATUS: 1 - Symptomatic but completely ambulatory  Vitals:   11/03/22 1212  BP: 138/88  Pulse: 70  Resp: 16  Temp: 98.1 F (36.7 C)  SpO2: 98%    Physical Exam Constitutional:      General: She is not in acute distress.    Appearance: Normal appearance. She is not toxic-appearing.  HENT:     Head: Normocephalic and atraumatic.  Eyes:     General: No scleral icterus. Cardiovascular:     Rate and Rhythm: Normal rate and regular  rhythm.     Pulses: Normal pulses.     Heart sounds: Normal heart sounds.  Pulmonary:     Effort: Pulmonary effort is normal.     Breath sounds: Normal breath sounds.  Abdominal:     General: Abdomen is flat. Bowel sounds are normal. There is no distension.     Palpations: Abdomen is soft.     Tenderness: There is no abdominal tenderness.  Musculoskeletal:        General: Swelling present.     Cervical back: Neck supple.  Lymphadenopathy:     Cervical: No cervical adenopathy.  Skin:    General: Skin is warm and dry.     Findings: No rash.  Neurological:     General: No focal deficit present.     Mental Status: She is alert.  Psychiatric:        Mood and Affect: Mood normal.        Behavior: Behavior normal.     LABORATORY DATA:  CBC    Component Value Date/Time   WBC 5.2 11/03/2022 1404   WBC 7.9 10/08/2021 1519   RBC 3.61 (L) 11/03/2022 1404   HGB 12.4 11/03/2022 1404   HGB 13.4 04/19/2019 0826   HGB 13.4 07/14/2016 1220   HCT 35.1 (L) 11/03/2022 1404   HCT 39.5 04/19/2019 0826   PLT 183 11/03/2022 1404   PLT 195 04/19/2019 0826   MCV 97.2 11/03/2022 1404   MCV 95 04/19/2019 0826   MCH 34.3 (H) 11/03/2022 1404   MCHC 35.3 11/03/2022 1404   RDW 12.0 11/03/2022 1404   RDW 11.7 04/19/2019 0826   LYMPHSABS 1.4 11/03/2022 1404   LYMPHSABS 2.6 04/19/2019 0826   MONOABS 0.5 11/03/2022 1404   EOSABS 0.2 11/03/2022 1404   EOSABS 0.2 04/19/2019 0826   BASOSABS 0.0 11/03/2022 1404   BASOSABS 0.1 04/19/2019 0826    CMP     Component Value Date/Time   NA 139 11/03/2022 1404   NA 136 04/19/2019 0826   K 3.5 11/03/2022 1404   CL 110 11/03/2022 1404   CO2 22 11/03/2022 1404   GLUCOSE 129 (H) 11/03/2022 1404   BUN 13 11/03/2022 1404   BUN 11 04/19/2019 0826   CREATININE  0.62 11/03/2022 1404   CREATININE 0.76 07/14/2016 1221   CALCIUM 9.0 11/03/2022 1404   PROT 7.0 11/03/2022 1404   PROT 6.7 04/19/2019 0826   ALBUMIN 3.9 11/03/2022 1404   ALBUMIN 4.2  04/19/2019 0826   AST 17 11/03/2022 1404   ALT 14 11/03/2022 1404   ALKPHOS 57 11/03/2022 1404   BILITOT 0.6 11/03/2022 1404   GFRNONAA >60 11/03/2022 1404   GFRAA 107 04/19/2019 0826      ASSESSMENT and THERAPY PLAN:   Malignant neoplasm of upper-outer quadrant of right female breast ([place]) Landy is a 51 year old woman with history of stage Ib triple positive breast cancer in the right breast diagnosed in January 2023.  She is status post neoadjuvant chemotherapy, lumpectomy, maintenance trastuzumab, adjuvant radiation, and antiestrogen therapy was recommended however she has not yet started it.  Crystale has no signs of breast cancer recurrence today.  She will continue on Herceptin therapy and will proceed with this today.  Her most recent echo was normal and she had follow-up with cardiology recently as well.  I discussed with Tikisha that her leg swelling is likely multifactorial.  I suggested that she monitor her sodium intake and wear compression stockings to see if that will help.  She has undergone cardiology consultation.  Another consultation to consider due to the severity and persistence of her symptoms is vascular surgery to evaluate the presence of peripheral vascular disease.   In regard to her legs not feeling like her own we will get some lab testing today to evaluate her electrolytes including her magnesium level.  In the meantime I did suggest that she go ahead and start a magnesium supplement at 400 mg a day.  I also asked offered her the option to see our neuro oncologist Dr. Mickeal Skinner to determine if an underlying neurologic concern is at play considering her concerns and issues, which she would like to hold off on.    Sheril and I discussed the tamoxifen again today and risks and benefits of whether to take the antiestrogen therapy.  She is still considering this option.  Apurva has labs and f/u with Dr. Lindi Adie on 11/25/2022 prior to her final Herceptin treatment.      All  questions were answered. The patient knows to call the clinic with any problems, questions or concerns. We can certainly see the patient much sooner if necessary.  Total encounter time:30 minutes*in face-to-face visit time, chart review, lab review, care coordination, order entry, and documentation of the encounter time.    Wilber Bihari, NP 11/08/22 2:24 PM Medical Oncology and Hematology [facility] [address], [place] [address] Tel. [phone]    Fax. [phone]  *Total Encounter Time as defined by the Centers for Medicare and Medicaid Services includes, in addition to the face-to-face time of a patient visit (documented in the note above) non-face-to-face time: obtaining and reviewing outside history, ordering and reviewing medications, tests or procedures, care coordination (communications with other health care professionals or caregivers) and documentation in the medical record.

## 2022-11-03 ENCOUNTER — Inpatient Hospital Stay: Payer: BC Managed Care – PPO

## 2022-11-03 ENCOUNTER — Inpatient Hospital Stay (HOSPITAL_BASED_OUTPATIENT_CLINIC_OR_DEPARTMENT_OTHER): Payer: BC Managed Care – PPO | Admitting: Adult Health

## 2022-11-03 ENCOUNTER — Other Ambulatory Visit: Payer: Self-pay

## 2022-11-03 VITALS — BP 138/88 | HR 70 | Temp 98.1°F | Resp 16 | Ht 68.0 in | Wt 187.6 lb

## 2022-11-03 DIAGNOSIS — R399 Unspecified symptoms and signs involving the genitourinary system: Secondary | ICD-10-CM | POA: Diagnosis not present

## 2022-11-03 DIAGNOSIS — Z79899 Other long term (current) drug therapy: Secondary | ICD-10-CM | POA: Diagnosis not present

## 2022-11-03 DIAGNOSIS — C50411 Malignant neoplasm of upper-outer quadrant of right female breast: Secondary | ICD-10-CM | POA: Diagnosis not present

## 2022-11-03 DIAGNOSIS — Z923 Personal history of irradiation: Secondary | ICD-10-CM | POA: Diagnosis not present

## 2022-11-03 DIAGNOSIS — Z170 Estrogen receptor positive status [ER+]: Secondary | ICD-10-CM

## 2022-11-03 DIAGNOSIS — Z5112 Encounter for antineoplastic immunotherapy: Secondary | ICD-10-CM | POA: Diagnosis not present

## 2022-11-03 DIAGNOSIS — Z79810 Long term (current) use of selective estrogen receptor modulators (SERMs): Secondary | ICD-10-CM | POA: Diagnosis not present

## 2022-11-03 DIAGNOSIS — Z9221 Personal history of antineoplastic chemotherapy: Secondary | ICD-10-CM | POA: Diagnosis not present

## 2022-11-03 DIAGNOSIS — Z87891 Personal history of nicotine dependence: Secondary | ICD-10-CM | POA: Diagnosis not present

## 2022-11-03 LAB — CMP (CANCER CENTER ONLY)
ALT: 14 U/L (ref 0–44)
AST: 17 U/L (ref 15–41)
Albumin: 3.9 g/dL (ref 3.5–5.0)
Alkaline Phosphatase: 57 U/L (ref 38–126)
Anion gap: 7 (ref 5–15)
BUN: 13 mg/dL (ref 6–20)
CO2: 22 mmol/L (ref 22–32)
Calcium: 9 mg/dL (ref 8.9–10.3)
Chloride: 110 mmol/L (ref 98–111)
Creatinine: 0.62 mg/dL (ref 0.44–1.00)
GFR, Estimated: >60 mL/min (ref >60)
Glucose, Bld: 129 mg/dL — ABNORMAL HIGH (ref 70–99)
Potassium: 3.5 mmol/L (ref 3.5–5.1)
Sodium: 139 mmol/L (ref 135–145)
Total Bilirubin: 0.6 mg/dL (ref 0.3–1.2)
Total Protein: 7 g/dL (ref 6.5–8.1)

## 2022-11-03 LAB — CBC WITH DIFFERENTIAL (CANCER CENTER ONLY)
Abs Immature Granulocytes: 0 10*3/uL (ref 0.00–0.07)
Basophils Absolute: 0 10*3/uL (ref 0.0–0.1)
Basophils Relative: 1 %
Eosinophils Absolute: 0.2 10*3/uL (ref 0.0–0.5)
Eosinophils Relative: 3 %
HCT: 35.1 % — ABNORMAL LOW (ref 36.0–46.0)
Hemoglobin: 12.4 g/dL (ref 12.0–15.0)
Immature Granulocytes: 0 %
Lymphocytes Relative: 26 %
Lymphs Abs: 1.4 10*3/uL (ref 0.7–4.0)
MCH: 34.3 pg — ABNORMAL HIGH (ref 26.0–34.0)
MCHC: 35.3 g/dL (ref 30.0–36.0)
MCV: 97.2 fL (ref 80.0–100.0)
Monocytes Absolute: 0.5 10*3/uL (ref 0.1–1.0)
Monocytes Relative: 9 %
Neutro Abs: 3.1 10*3/uL (ref 1.7–7.7)
Neutrophils Relative %: 61 %
Platelet Count: 183 10*3/uL (ref 150–400)
RBC: 3.61 MIL/uL — ABNORMAL LOW (ref 3.87–5.11)
RDW: 12 % (ref 11.5–15.5)
WBC Count: 5.2 10*3/uL (ref 4.0–10.5)
nRBC: 0 % (ref 0.0–0.2)

## 2022-11-03 LAB — URINALYSIS, COMPLETE (UACMP) WITH MICROSCOPIC
Bacteria, UA: NONE SEEN
Bilirubin Urine: NEGATIVE
Glucose, UA: NEGATIVE mg/dL
Hgb urine dipstick: NEGATIVE
Ketones, ur: NEGATIVE mg/dL
Leukocytes,Ua: NEGATIVE
Nitrite: NEGATIVE
Protein, ur: NEGATIVE mg/dL
Specific Gravity, Urine: 1.012 (ref 1.005–1.030)
pH: 5 (ref 5.0–8.0)

## 2022-11-03 LAB — MAGNESIUM: Magnesium: 1.5 mg/dL — ABNORMAL LOW (ref 1.7–2.4)

## 2022-11-03 MED ORDER — SODIUM CHLORIDE 0.9 % IV SOLN: Freq: Once | INTRAVENOUS | Status: AC

## 2022-11-03 MED ORDER — ACETAMINOPHEN 325 MG PO TABS
650.0000 mg | ORAL_TABLET | Freq: Once | ORAL | Status: AC
  Administered 2022-11-03: 650 mg via ORAL
  Filled 2022-11-03: qty 2

## 2022-11-03 MED ORDER — HEPARIN SOD (PORK) LOCK FLUSH 100 UNIT/ML IV SOLN
500.0000 [IU] | Freq: Once | INTRAVENOUS | Status: AC | PRN
  Administered 2022-11-03: 500 [IU]

## 2022-11-03 MED ORDER — SODIUM CHLORIDE 0.9% FLUSH
10.0000 mL | INTRAVENOUS | Status: DC | PRN
  Administered 2022-11-03: 10 mL

## 2022-11-03 MED ORDER — TRASTUZUMAB-DKST CHEMO 150 MG IV SOLR
6.0000 mg/kg | Freq: Once | INTRAVENOUS | Status: AC
  Administered 2022-11-03: 483 mg via INTRAVENOUS
  Filled 2022-11-03: qty 23

## 2022-11-03 MED ORDER — LORATADINE 10 MG PO TABS
10.0000 mg | ORAL_TABLET | Freq: Once | ORAL | Status: AC
  Administered 2022-11-03: 10 mg via ORAL
  Filled 2022-11-03: qty 1

## 2022-11-03 NOTE — Patient Instructions (Signed)
[facility]  Discharge Instructions: Thank you for choosing [facility] to provide your oncology and hematology care.   If you have a lab appointment with the [facility], please go directly to the [facility] and check in at the registration area.   Wear comfortable clothing and clothing appropriate for easy access to any Portacath or PICC line.   We strive to give you quality time with your provider. You may need to reschedule your appointment if you arrive late (15 or more minutes).  Arriving late affects you and other patients whose appointments are after yours.  Also, if you miss three or more appointments without notifying the office, you may be dismissed from the clinic at the provider's discretion.      For prescription refill requests, have your pharmacy contact our office and allow 72 hours for refills to be completed.    Today you received the following chemotherapy and/or immunotherapy agents herceptin      To help prevent nausea and vomiting after your treatment, we encourage you to take your nausea medication as directed.  BELOW ARE SYMPTOMS THAT SHOULD BE REPORTED IMMEDIATELY: *FEVER GREATER THAN 100.4 F (38 C) OR HIGHER *CHILLS OR SWEATING *NAUSEA AND VOMITING THAT IS NOT CONTROLLED WITH YOUR NAUSEA MEDICATION *UNUSUAL SHORTNESS OF BREATH *UNUSUAL BRUISING OR BLEEDING *URINARY PROBLEMS (pain or burning when urinating, or frequent urination) *BOWEL PROBLEMS (unusual diarrhea, constipation, pain near the anus) TENDERNESS IN MOUTH AND THROAT WITH OR WITHOUT PRESENCE OF ULCERS (sore throat, sores in mouth, or a toothache) UNUSUAL RASH, SWELLING OR PAIN  UNUSUAL VAGINAL DISCHARGE OR ITCHING   Items with * indicate a potential emergency and should be followed up as soon as possible or go to the Emergency Department if any problems should occur.  Please show the CHEMOTHERAPY ALERT CARD or IMMUNOTHERAPY ALERT CARD at check-in to  the Emergency Department and triage nurse.  Should you have questions after your visit or need to cancel or reschedule your appointment, please contact [facility]  Dept: [phone]  and follow the prompts.  Office hours are 8:00 a.m. to 4:30 p.m. Monday - Friday. Please note that voicemails left after 4:00 p.m. may not be returned until the following business day.  We are closed weekends and major holidays. You have access to a nurse at all times for urgent questions. Please call the main number to the clinic Dept: [phone] and follow the prompts.   For any non-urgent questions, you may also contact your provider using MyChart. We now offer e-Visits for anyone 60 and older to request care online for non-urgent symptoms. For details visit [email or web address].   Also download the MyChart app! Go to the app store, search "MyChart", open the app, select [facility], and log in with your MyChart username and password.

## 2022-11-03 NOTE — Assessment & Plan Note (Addendum)
Julie Atkinson is a 51 year old woman with history of stage Ib triple positive breast cancer in the right breast diagnosed in January 2023.  She is status post neoadjuvant chemotherapy, lumpectomy, maintenance trastuzumab, adjuvant radiation, and antiestrogen therapy was recommended however she has not yet started it.  Julie Atkinson has no signs of breast cancer recurrence today.  She will continue on Herceptin therapy and will proceed with this today.  Her most recent echo was normal and she had follow-up with cardiology recently as well.  I discussed with Julie Atkinson that her leg swelling is likely multifactorial.  I suggested that she monitor her sodium intake and wear compression stockings to see if that will help.  She has undergone cardiology consultation.  Another consultation to consider due to the severity and persistence of her symptoms is vascular surgery to evaluate the presence of peripheral vascular disease.   In regard to her legs not feeling like her own we will get some lab testing today to evaluate her electrolytes including her magnesium level.  In the meantime I did suggest that she go ahead and start a magnesium supplement at 400 mg a day.  I also asked offered her the option to see our neuro oncologist Julie Atkinson to determine if an underlying neurologic concern is at play considering her concerns and issues, which she would like to hold off on.    Julie Atkinson and I discussed the tamoxifen again today and risks and benefits of whether to take the antiestrogen therapy.  She is still considering this option.  Julie Atkinson has labs and f/u with Julie Atkinson on 11/25/2022 prior to her final Herceptin treatment.

## 2022-11-04 ENCOUNTER — Other Ambulatory Visit: Payer: Self-pay

## 2022-11-04 ENCOUNTER — Telehealth: Payer: Self-pay | Admitting: Hematology and Oncology

## 2022-11-04 ENCOUNTER — Encounter: Payer: Self-pay | Admitting: Rehabilitation

## 2022-11-04 ENCOUNTER — Telehealth: Payer: Self-pay

## 2022-11-04 ENCOUNTER — Ambulatory Visit: Payer: BC Managed Care – PPO | Admitting: Rehabilitation

## 2022-11-04 DIAGNOSIS — R601 Generalized edema: Secondary | ICD-10-CM

## 2022-11-04 DIAGNOSIS — M6281 Muscle weakness (generalized): Secondary | ICD-10-CM | POA: Diagnosis not present

## 2022-11-04 DIAGNOSIS — M79601 Pain in right arm: Secondary | ICD-10-CM | POA: Diagnosis not present

## 2022-11-04 DIAGNOSIS — Z170 Estrogen receptor positive status [ER+]: Secondary | ICD-10-CM | POA: Diagnosis not present

## 2022-11-04 DIAGNOSIS — C50411 Malignant neoplasm of upper-outer quadrant of right female breast: Secondary | ICD-10-CM | POA: Diagnosis not present

## 2022-11-04 DIAGNOSIS — R293 Abnormal posture: Secondary | ICD-10-CM

## 2022-11-04 LAB — URINE CULTURE: Culture: NO GROWTH
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
## 2022-11-04 NOTE — Telephone Encounter (Signed)
-----   Message from Gardenia Phlegm, NP sent at 11/03/2022  9:52 PM EST ----- Please let Julie Atkinson know her magnesium is low.  I recommend that she get the magnesium supplement we discussed and take '400mg'$  daily.  Recheck in 3 weeks with CMP and CBC prior to treatment.    Thanks, LC ----- Message ----- From: Buel Ream, [facility] Sent: 11/03/2022   2:32 PM EST To: Gardenia Phlegm, NP

## 2022-11-04 NOTE — Telephone Encounter (Signed)
Scheduled appointment per WQ. Patient is aware. 

## 2022-11-04 NOTE — Telephone Encounter (Addendum)
Called Pt and given below message. Pt expressed concern with CBC results and asked to be seen by Gudena on day of lab redraw. Advised Pt that Gudena appt may not be possible on that day and that new lab appt would help monitor CBC. Pt stated "I am not asking, I want to see Dr. Lindi Adie with my next treatment. Why can I not see my own oncologist?" Explained to Pt that this RN will need to call back regarding MD appt in order to review openings. Pt hung up before confirming lab recheck in 3 weeks.

## 2022-11-05 ENCOUNTER — Telehealth: Payer: Self-pay

## 2022-11-05 NOTE — Telephone Encounter (Signed)
-----   Message from Gardenia Phlegm, NP sent at 11/05/2022  9:13 AM EST ----- Please let Linzey know that her urine looks good.  :)  There is no infection and the culture didn't grow anything.  ----- Message ----- From: Buel Ream, [facility] [place] Sent: 11/03/2022   2:32 PM EST To: Gardenia Phlegm, NP

## 2022-11-05 NOTE — Telephone Encounter (Signed)
Called Pt with below message who verbalized understanding and was appreciative of call.

## 2022-11-08 ENCOUNTER — Encounter: Payer: Self-pay | Admitting: Hematology and Oncology

## 2022-11-08 ENCOUNTER — Encounter: Payer: Self-pay | Admitting: Adult Health

## 2022-11-09 ENCOUNTER — Encounter (HOSPITAL_BASED_OUTPATIENT_CLINIC_OR_DEPARTMENT_OTHER): Payer: Self-pay | Admitting: Obstetrics & Gynecology

## 2022-11-09 ENCOUNTER — Ambulatory Visit: Payer: BC Managed Care – PPO | Admitting: Rehabilitation

## 2022-11-09 ENCOUNTER — Ambulatory Visit (INDEPENDENT_AMBULATORY_CARE_PROVIDER_SITE_OTHER): Payer: BC Managed Care – PPO | Admitting: Obstetrics & Gynecology

## 2022-11-09 ENCOUNTER — Other Ambulatory Visit (HOSPITAL_COMMUNITY)
Admission: RE | Admit: 2022-11-09 | Discharge: 2022-11-09 | Disposition: A | Discharge status: Home / Self Care | Payer: BC Managed Care – PPO | Source: Ambulatory Visit | Attending: Obstetrics & Gynecology | Admitting: Obstetrics & Gynecology

## 2022-11-09 VITALS — BP 120/73 | HR 67 | Ht 67.75 in | Wt 183.5 lb

## 2022-11-09 DIAGNOSIS — Z124 Encounter for screening for malignant neoplasm of cervix: Secondary | ICD-10-CM | POA: Insufficient documentation

## 2022-11-09 DIAGNOSIS — Z0000 Encounter for general adult medical examination without abnormal findings: Secondary | ICD-10-CM

## 2022-11-09 DIAGNOSIS — Z13820 Encounter for screening for osteoporosis: Secondary | ICD-10-CM

## 2022-11-09 DIAGNOSIS — Z0184 Encounter for antibody response examination: Secondary | ICD-10-CM | POA: Diagnosis not present

## 2022-11-09 DIAGNOSIS — Z1211 Encounter for screening for malignant neoplasm of colon: Secondary | ICD-10-CM | POA: Diagnosis not present

## 2022-11-09 DIAGNOSIS — Z01419 Encounter for gynecological examination (general) (routine) without abnormal findings: Secondary | ICD-10-CM

## 2022-11-09 DIAGNOSIS — C50411 Malignant neoplasm of upper-outer quadrant of right female breast: Secondary | ICD-10-CM

## 2022-11-09 DIAGNOSIS — Z9189 Other specified personal risk factors, not elsewhere classified: Secondary | ICD-10-CM

## 2022-11-09 DIAGNOSIS — Z7689 Persons encountering health services in other specified circumstances: Secondary | ICD-10-CM

## 2022-11-09 NOTE — Progress Notes (Unsigned)
52 y.o. [id number] Single White or Caucasian female here for annual exam/new patient exam.  Has some questions about fertility and egg retrieval.  Stopped cycles last year in 02-16-23 after starting chemotherapy.  Tamoxifen as adjuvant therapy recommended.  Has taken only two doses of Tamoxifen.  Very nervous about this and hx of mother with who died of PE/DVT.  Hasn't discussed this with Dr. Lindi Adie.  Would like to consider other options.  Questions answered.   Needs PCP.  Has not done colonoscopy.  When reviewing vaccines, pt states she's never had chicken pox.  Advised shingrix is safe.  She would like to know if should get varicella vaccine instead.  Antibody testing can be done and she desires it so ordered with labs.  No LMP recorded. (Menstrual status: Chemotherapy).          Sexually active: No.  The current method of family planning is abstinence.    Smoker:  no  Health Maintenance: Pap:  last done 02/16/19 History of abnormal Pap:  no MMG:  04/2022 Colonoscopy:  has not done BMD:   ordered Screening Labs: ordered   reports that she quit smoking about 8 years ago. Her smoking use included cigarettes. She has quit using smokeless tobacco. She reports current alcohol use of about 4.0 standard drinks of alcohol per week. She reports that she does not use drugs.  Past Medical History:  Diagnosis Date   Depression    Family history of breast cancer 12/04/2021   Family history of ovarian cancer 12/04/2021   Fibroid February 15, 2014   8 mm   History of posttraumatic stress disorder (PTSD) 02/16/2008   due to maternal death-MI   History of radiation therapy    Right breast- 07/15/22-08/23/22- Dr. Gery Pray   PTSD (post-traumatic stress disorder)    mother's death February 16, 2008; seizure and AMI while driving, patient performed CPR x 45 minutes   Seropositive for herpes simplex 2 infection 02-16-2016    Past Surgical History:  Procedure Laterality Date   BLADDER SURGERY  1978   nocturnal enuresis treatment   CESAREAN  SECTION  Feb 15, 2006   Dr Quincy Simmonds   DILATATION & CURETTAGE/HYSTEROSCOPY WITH MYOSURE N/A 01/06/2016   Procedure: DILATATION & CURETTAGE/HYSTEROSCOPY WITH Jacklynn Barnacle;  Surgeon: Nunzio Cobbs, MD;  Location: [facility];  Service: Gynecology;  Laterality: N/A;   DILATION AND CURETTAGE OF UTERUS  1990   MAB?   PORTACATH PLACEMENT Left 12/15/2021   Procedure: INSERTION PORT-A-CATH;  Surgeon: Coralie Keens, MD;  Location: [facility];  Service: General;  Laterality: Left;    Current Outpatient Medications  Medication Sig Dispense Refill   acetaminophen (TYLENOL) 500 MG tablet Take 500 mg by mouth every 6 (six) hours as needed.     Ascorbic Acid (VITAMIN C) 100 MG tablet Take 100 mg by mouth daily.     lidocaine-prilocaine (EMLA) cream Apply 1 Application topically as needed. 30 g 1   tamoxifen (NOLVADEX) 20 MG tablet Take 1 tablet (20 mg total) by mouth daily. 90 tablet 3   thiamine (VITAMIN B-1) 50 MG tablet Take 50 mg by mouth daily.     Biotin 5000 MCG TABS Take 5,000 mcg by mouth daily. (Patient not taking: Reported on 11/09/2022)     clindamycin (CLINDAGEL) 1 % gel Apply topically. (Patient not taking: Reported on 09/23/2022)     diltiazem (CARDIZEM) 30 MG tablet Take 1 tablet (30 mg total) by mouth every 6 (six) hours as needed (as needed for palpitations). (Patient  not taking: Reported on 09/23/2022) 120 tablet 3   LYSINE PO Take 1 Dose by mouth daily. (Patient not taking: Reported on 11/09/2022)     No current facility-administered medications for this visit.    Family History  Problem Relation Age of Onset   Pulmonary disease Mother    Heart disease Mother    Cervical cancer Mother 38   Breast cancer Maternal Aunt        dx late 73s   Lung cancer Maternal Uncle        d. 92   Bone cancer Paternal Grandmother        primary? limited info; d. 76s   Breast cancer Cousin 50       maternal female cousin   Ovarian cancer Cousin        maternal cousin; d. 58   Cancer Other         cervical or other GYN cancer in several maternal relatives    ROS: Constitutional: negative Genitourinary:negative  Exam:   BP 120/73 (BP Location: Left Arm, Patient Position: Sitting, Cuff Size: Large)   Pulse 67   Ht 5' 7.75" (1.721 m) Comment: Reported  Wt 183 lb 8 oz (83.2 kg)   BMI 28.11 kg/m   Height: 5' 7.75" (172.1 cm) (Reported)  General appearance: alert, cooperative and appears stated age Head: Normocephalic, without obvious abnormality, atraumatic Neck: no adenopathy, supple, symmetrical, trachea midline and thyroid normal to inspection and palpation Lungs: clear to auscultation bilaterally Breasts: normal appearance, no masses or tenderness Heart: regular rate and rhythm Abdomen: soft, non-tender; bowel sounds normal; no masses,  no organomegaly Extremities: extremities normal, atraumatic, no cyanosis or edema Skin: Skin color, texture, turgor normal. No rashes or lesions Lymph nodes: Cervical, supraclavicular, and axillary nodes normal. No abnormal inguinal nodes palpated Neurologic: Grossly normal   Pelvic: External genitalia:  no lesions              Urethra:  normal appearing urethra with no masses, tenderness or lesions              Bartholins and Skenes: normal                 Vagina: normal appearing vagina with normal color and no discharge, no lesions              Cervix: no lesions              Pap taken: Yes.   Bimanual Exam:  Uterus:  normal size, contour, position, consistency, mobility, non-tender              Adnexa: normal adnexa and no mass, fullness, tenderness               Rectovaginal: Confirms               Anus:  normal sphincter tone, no lesions  Chaperone, Octaviano Batty, CMA, was present for exam.  Assessment/Plan: 1. Well woman exam with routine gynecological exam - Pap smear obtained today with HR HPV - Mammogram ordered for [facility] mammography and will be faxed. - Colonoscopy referral placed.  Feel she should do a colonoscopy  given breast cancer hx instead of cologuard - Bone mineral density ordered to be done at [facility] - PCP options discussed - vaccines reviewed/updated  2. Cervical cancer screening - Cytology - PAP( Colon)  3. Screen for colon cancer - Ambulatory referral to Gastroenterology  4. Osteoporosis screening - DG BONE DENSITY (DXA); Future  5. Encounter to establish care - referral placed - Ambulatory Referral to Primary Care  6. Blood tests for routine general physical examination - Hemoglobin A1c - Lipid panel - TSH  7. Antibody response exam - Varicella zoster antibody, IgG - once results back can recommend shingrix vs varicella vaccinaton  8. At risk for fertility problems - will check with [facility] about age limits for IVF consideration but I feel she is likely above the age limit.  If not, she will need to return for [facility]  9. Malignant neoplasm of upper-outer quadrant of right female breast, unspecified estrogen receptor status ([place]) - MM DIAG BREAST TOMO BILATERAL; Future

## 2022-11-09 NOTE — Patient Instructions (Addendum)
[address], [place], [place] [address] Phone: [phone]  Call [phone] to schedule an appointment at [facility].  This is for your bone density testing.

## 2022-11-10 LAB — VARICELLA ZOSTER ANTIBODY, IGG: Varicella zoster IgG: 533 index (ref >165)

## 2022-11-10 LAB — LIPID PANEL
Chol/HDL Ratio: 2.2 ratio (ref 0.0–4.4)
Cholesterol, Total: 227 mg/dL — ABNORMAL HIGH (ref 100–199)
HDL: 103 mg/dL (ref >39)
LDL Chol Calc (NIH): 110 mg/dL — ABNORMAL HIGH (ref 0–99)
Triglycerides: 80 mg/dL (ref 0–149)
VLDL Cholesterol Cal: 14 mg/dL (ref 5–40)

## 2022-11-10 LAB — HEMOGLOBIN A1C
Est. average glucose Bld gHb Est-mCnc: 97 mg/dL
Hgb A1c MFr Bld: 5 % (ref 4.8–5.6)

## 2022-11-10 LAB — TSH: TSH: 2.36 u[IU]/mL (ref 0.450–4.500)

## 2022-11-11 LAB — CYTOLOGY - PAP
Comment: NEGATIVE
Diagnosis: NEGATIVE
High risk HPV: NEGATIVE

## 2022-11-15 ENCOUNTER — Encounter: Payer: Self-pay | Admitting: Rehabilitation

## 2022-11-15 ENCOUNTER — Ambulatory Visit: Payer: BC Managed Care – PPO | Attending: Radiation Oncology | Admitting: Rehabilitation

## 2022-11-15 DIAGNOSIS — M79601 Pain in right arm: Secondary | ICD-10-CM | POA: Diagnosis not present

## 2022-11-15 DIAGNOSIS — Z170 Estrogen receptor positive status [ER+]: Secondary | ICD-10-CM | POA: Diagnosis not present

## 2022-11-15 DIAGNOSIS — R293 Abnormal posture: Secondary | ICD-10-CM | POA: Diagnosis not present

## 2022-11-15 DIAGNOSIS — R601 Generalized edema: Secondary | ICD-10-CM | POA: Diagnosis not present

## 2022-11-15 DIAGNOSIS — M6281 Muscle weakness (generalized): Secondary | ICD-10-CM | POA: Diagnosis not present

## 2022-11-15 DIAGNOSIS — C50411 Malignant neoplasm of upper-outer quadrant of right female breast: Secondary | ICD-10-CM | POA: Insufficient documentation

## 2022-11-15 NOTE — Therapy (Signed)
[facility] OUTPATIENT PHYSICAL THERAPY BREAST CANCER TREATMENT   Patient Name: Julie Atkinson MRN: [id number] DOB:08-14-1971, 52 y.o., female Today's Date: 11/15/2022   PT End of Session - 11/15/22 1112     Visit Number 7    Number of Visits 13    Date for PT Re-Evaluation 12/06/22    PT Start Time 1000    PT Stop Time 1046    PT Time Calculation (min) 46 min    Activity Tolerance Patient tolerated treatment well    Behavior During Therapy [facility] for tasks assessed/performed                  Past Medical History:  Diagnosis Date   Depression    Family history of breast cancer 12/04/2021   Family history of ovarian cancer 12/04/2021   Fibroid Feb 04, 2014   8 mm   History of posttraumatic stress disorder (PTSD) 02-05-2008   due to maternal death-MI   History of radiation therapy    Right breast- 07/15/22-08/23/22- Dr. Gery Pray   PTSD (post-traumatic stress disorder)    mother's death 02-05-08; seizure and AMI while driving, patient performed CPR x 45 minutes   Seropositive for herpes simplex 2 infection 02-05-16   Past Surgical History:  Procedure Laterality Date   BLADDER SURGERY  1978   nocturnal enuresis treatment   CESAREAN SECTION  02/04/06   Dr Quincy Simmonds   DILATATION & CURETTAGE/HYSTEROSCOPY WITH MYOSURE N/A 01/06/2016   Procedure: DILATATION & CURETTAGE/HYSTEROSCOPY WITH Jacklynn Barnacle;  Surgeon: Nunzio Cobbs, MD;  Location: [facility];  Service: Gynecology;  Laterality: N/A;   DILATION AND CURETTAGE OF UTERUS  1990   MAB?   PORTACATH PLACEMENT Left 12/15/2021   Procedure: INSERTION PORT-A-CATH;  Surgeon: Coralie Keens, MD;  Location: [facility];  Service: General;  Laterality: Left;   Patient Active Problem List   Diagnosis Date Noted   Port-A-Cath in place 12/16/2021   Family history of breast cancer 12/04/2021   Family history of ovarian cancer 12/04/2021   Genetic testing 12/04/2021   Malignant neoplasm of upper-outer quadrant of right female breast ([employer]) 12/01/2021    Breast lump on right side at 10 o'clock position 10/21/2021   PTSD (post-traumatic stress disorder)     PCP: [name], NP  REFERRING PROVIDER: Coralie Keens, MD  REFERRING DIAG: Right breast Cancer   THERAPY DIAG:  Muscle weakness (generalized)  Abnormal posture  Pain in right arm  Malignant neoplasm of upper-outer quadrant of right breast in female, estrogen receptor positive ([place])  Generalized edema  Rationale for Evaluation and Treatment Rehabilitation  ONSET DATE: 11/17/2021  SUBJECTIVE:  SUBJECTIVE STATEMENT: I started taking the tamoxifen.  I started feeling better from the driveway fall as of yesterday.    PERTINENT HISTORY:  Patient was diagnosed on 11/17/2021 with right grade grade II invasive ductal and lobular carcinoma breast cancer.. It measures 2.4 cm and is located in the upper outer quadrant. It is triple positive with a Ki67 of 15%. Radiation completed end of October.     05/24/2022- bilateral breast reduction   PATIENT GOALS:  Reassess how my recovery is going related to arm function, pain, and swelling.  PAIN:  Evaluation: Are you having pain? Yes: NPRS scale: 4/10 Pain location: on both breasts and left side Pain description: burning  Aggravating factors: nothing  Relieving factors: PT visits  PRECAUTIONS: Recent Surgery, right UE Lymphedema risk, None  ACTIVITY LEVEL / LEISURE: She goes to the gym and lifts weights and rides a stationary bike for 7 miles (30 min) several times per week   OBJECTIVE:   PATIENT SURVEYS:  Breast Complaints: Pain in operated breast- 8 A feeling of heaviness in the operate breast-9 A swollen breast at the operated side-9 The skin feels tensed at the operated breast-10 Redness of the skin at the operated breast-6 A  print of my bra is visible at the operated breast-0 The pores of the skin at the operate breast are enlarged-4  The operated breast feels hard at some place- 10 56/80 on 10/25/22  OBSERVATIONS: Right breast has noticeable swelling with enlarged pores and nipple. The scar is healing as expected. Left breast had some hardening in the lower midline quadrant.   POSTURE:   Forward head and rounded shoulders  LYMPHEDEMA ASSESSMENT:     UPPER EXTREMITY AROM/PROM:   A/PROM Right 12/02/2021 Left 12/02/2021 Right 09/01/2022 Left  09/01/2022  Shoulder extension 47 49 60 60  Shoulder flexion 150 146 180 179  Shoulder abduction 157 160 171 167  Shoulder internal rotation 60 60 65 72  Shoulder external rotation 84 85 90 90                          (Blank rows = not tested)    CERVICAL AROM: All within normal limits   UPPER EXTREMITY STRENGTH: WNL    LYMPHEDEMA ASSESSMENTS:    LANDMARK RIGHT 12/02/2021 LEFT 12/02/2021 Right  09/01/2022 Left  09/01/2022  10 cm proximal to olecranon process 31.1 32.8 30 32  Olecranon process 26.3 26.7 25.8 25.8  10 cm proximal to ulnar styloid process 23.6 22.2 21.2 19.9  Just proximal to ulnar styloid process 16.4 16.3 15.6 16.2  Across hand at thumb web space 19.9 19.3 19.4 19  At base of 2nd digit 6.5 6.'3 6 6  '$ (Blank rows = not tested)   Surgery type/Date: Right Lumpectomy and Left benign Lumpectomy:05/05/2022 Number of lymph nodes removed: 2/2  Current/past treatment (chemo, radiation, hormone therapy): radiation, chemo  Other symptoms:  Heaviness/tightness Yes Pain Yes Pitting edema No Infections No  PATIENT EDUCATION:  Education details: Right breast MLD, Exercise after surgery, Lymphedema  Person educated: Patient Education method: Explanation, Demonstration, and Handouts Education comprehension: verbalized understanding and returned demonstration  HOME EXERCISE PROGRAM: Self MLD for right breast   TODAY'S TREATMENT: Pt permission  and consent throughout each step of examination and treatment with modification and draping if requested when working on sensitive areas  11/15/22 TE: Pulleys x 62mn flexion and abduction Supine snow angels easy Sidleying open book 3x15" bil Doorway easy Mermaid stretch 2x20"  left only  Manual: In supine: Short neck, 5 diaphragmatic breaths, L axillary nodes and establishment of interaxillary pathway, R inguinal nodes and establishment of axilloinguinal pathway, then R breast moving fluid towards pathways spending extra time in any areas of fibrosis then retracing all steps. Working in side lying along the posterior interaxillary pathways.   11/04/2022: Manual: In supine: Short neck, 5 diaphragmatic breaths, L axillary nodes and establishment of interaxillary pathway, R inguinal nodes and establishment of axilloinguinal pathway, then R breast moving fluid towards pathways spending extra time in any areas of fibrosis then retracing all steps. Working in side lying along the posterior interaxillary pathways.  Discussed onset of lower extremity swelling could be related to herceptin and CVI and that the treatment is compression stockings, elevation and exercise.   Measured calf: 38.5cm, ankle: 22cm and length 43cm and emailed [facility] to make sure which stockings come in pairs.  Will message pt with instruction but will use her chemo socks for now.   10/25/2022: Manual: In supine: Short neck, 5 diaphragmatic breaths, L axillary nodes and establishment of interaxillary pathway, R inguinal nodes and establishment of axilloinguinal pathway, then R breast moving fluid towards pathways spending extra time in any areas of fibrosis then retracing all steps. Working in side lying along the posterior interaxillary pathways.  Review of breast MLD pressures as pt shows demo a bit heavy handed.   Will extend POC due to improvements of breast status with in person visits.   10/11/2022: Exercise:Pulleys- flexion  and abduction  AAROM ball flexion 1x10 Manual: In supine: Short neck, 5 diaphragmatic breaths, L axillary nodes and establishment of interaxillary pathway, R inguinal nodes and establishment of axilloinguinal pathway, then R breast moving fluid towards pathways spending extra time in any areas of fibrosis then retracing all steps. Working in side lying along the posterior interaxillary pathways.  Review of breast MLD  ASSESSMENT: CLINICAL IMPRESSION:  Pt is feeling much better today after starting magnesium and vitamin D.  She is still sore from the fall and feels like both breasts are tight and heavy.   Pt will benefit from skilled therapeutic intervention to improve on the following deficits: Decreased knowledge of precautions, impaired UE functional use, pain, decreased ROM, postural dysfunction.   PT treatment/interventions: ADL/Self care home management, Therapeutic exercises, Therapeutic activity, Neuromuscular re-education, Balance training, Gait training, Patient/Family education, Self Care, Joint mobilization, Dry Needling, Cryotherapy, Moist heat, Manual lymph drainage, scar mobilization, Taping, and Manual therapy   GOALS: Goals reviewed with patient? Yes  LONG TERM GOALS:  (STG=LTG)  GOALS Name Target Date  Goal status  1 Pt will demonstrate she has regained full shoulder ROM and function post operatively compared to baselines.  Baseline: 09/01/2022 MET  2  Pt will report a dash score of 4.55% to an increase in functional ability and reduction of pain. 11/24/2021 INITIAL  3 Pt will report 75% reduction of pain due to improvements in posture, strength, and muscle length  11/24/2021 INITIAL  4 Patient will be Independent with advance HEP 11/24/2021 INITIAL     PLAN:  PT FREQUENCY/DURATION: 1-2x per week x 6 weeks   PLAN FOR NEXT SESSION:review self MLD, scar work,  breast MLD, gentle stretching   [facility]  [address], [address]  [place]  [address]  [phone]  After Breast Cancer Class It is recommended you attend the ABC class to be educated on lymphedema risk reduction. This class is free of charge and lasts for 1  hour. It is a 1-time class. You will need to download the Webex app either on your phone or computer. We will send you a link the night before or the morning of the class. You should be able to click on that link to join the class. This is not a confidential class. You don't have to turn your camera on, but other participants may be able to see your email address.  Scar massage You can begin gentle scar massage to you incision sites. Gently place one hand on the incision and move the skin (without sliding on the skin) in various directions. Do this for a few minutes and then you can gently massage either coconut oil or vitamin E cream into the scars.  Compression garment You should continue wearing your compression bra until you feel like you no longer have swelling.  Home exercise Program Continue doing the exercises you were given until you feel like you can do them without feeling any tightness at the end.   Walking Program Studies show that 30 minutes of walking per day (fast enough to elevate your heart rate) can significantly reduce the risk of a cancer recurrence. If you can't walk due to other medical reasons, we encourage you to find another activity you could do (like a stationary bike or water exercise).  Posture After breast cancer surgery, people frequently sit with rounded shoulders posture because it puts their incisions on slack and feels better. If you sit like this and scar tissue forms in that position, you can become very tight and have pain sitting or standing with good posture. Try to be aware of your posture and sit and stand up tall to heal properly.  Follow up PT: It is recommended you return every 3 months for the first 3 years following surgery to be assessed on the SOZO machine for an  L-Dex score. This helps prevent clinically significant lymphedema in 95% of patients. These follow up screens are 10 minute appointments that you are not billed for.   Shan Levans, PT  11/15/2022  11:12 AM

## 2022-11-16 DIAGNOSIS — N6489 Other specified disorders of breast: Secondary | ICD-10-CM | POA: Diagnosis not present

## 2022-11-16 DIAGNOSIS — Z853 Personal history of malignant neoplasm of breast: Secondary | ICD-10-CM | POA: Diagnosis not present

## 2022-11-16 DIAGNOSIS — R928 Other abnormal and inconclusive findings on diagnostic imaging of breast: Secondary | ICD-10-CM | POA: Diagnosis not present

## 2022-11-18 ENCOUNTER — Other Ambulatory Visit: Payer: Self-pay

## 2022-11-22 ENCOUNTER — Ambulatory Visit: Payer: BC Managed Care – PPO | Admitting: Rehabilitation

## 2022-11-22 ENCOUNTER — Encounter: Payer: Self-pay | Admitting: Gastroenterology

## 2022-11-22 ENCOUNTER — Encounter: Payer: Self-pay | Admitting: Rehabilitation

## 2022-11-22 ENCOUNTER — Telehealth: Payer: Self-pay

## 2022-11-22 ENCOUNTER — Ambulatory Visit (AMBULATORY_SURGERY_CENTER): Payer: BC Managed Care – PPO

## 2022-11-22 VITALS — Ht 68.4 in | Wt 181.0 lb

## 2022-11-22 DIAGNOSIS — C50411 Malignant neoplasm of upper-outer quadrant of right female breast: Secondary | ICD-10-CM | POA: Diagnosis not present

## 2022-11-22 DIAGNOSIS — R293 Abnormal posture: Secondary | ICD-10-CM | POA: Diagnosis not present

## 2022-11-22 DIAGNOSIS — M79601 Pain in right arm: Secondary | ICD-10-CM

## 2022-11-22 DIAGNOSIS — R601 Generalized edema: Secondary | ICD-10-CM

## 2022-11-22 DIAGNOSIS — Z1211 Encounter for screening for malignant neoplasm of colon: Secondary | ICD-10-CM

## 2022-11-22 DIAGNOSIS — Z170 Estrogen receptor positive status [ER+]: Secondary | ICD-10-CM | POA: Diagnosis not present

## 2022-11-22 DIAGNOSIS — M6281 Muscle weakness (generalized): Secondary | ICD-10-CM | POA: Diagnosis not present

## 2022-11-22 MED ORDER — NA SULFATE-K SULFATE-MG SULF 17.5-3.13-1.6 GM/177ML PO SOLN: 1.0000 | Freq: Once | ORAL | 0 refills | Status: AC

## 2022-11-22 NOTE — Therapy (Signed)
[facility] OUTPATIENT PHYSICAL THERAPY BREAST CANCER TREATMENT   Patient Name: Julie Atkinson MRN: [id number] DOB:11-27-1970, 52 y.o., female Today's Date: 11/22/2022   PT End of Session - 11/22/22 0901     Visit Number 8    Number of Visits 13    Date for PT Re-Evaluation 12/06/22    PT Start Time 0900    PT Stop Time 0954    PT Time Calculation (min) 54 min    Activity Tolerance Patient tolerated treatment well    Behavior During Therapy [facility] for tasks assessed/performed                  Past Medical History:  Diagnosis Date   Depression    Family history of breast cancer 12/04/2021   Family history of ovarian cancer 12/04/2021   Fibroid 02-03-14   8 mm   History of posttraumatic stress disorder (PTSD) 02-04-2008   due to maternal death-MI   History of radiation therapy    Right breast- 07/15/22-08/23/22- Dr. Gery Pray   PTSD (post-traumatic stress disorder)    mother's death Feb 04, 2008; seizure and AMI while driving, patient performed CPR x 45 minutes   Seropositive for herpes simplex 2 infection 02-04-16   Past Surgical History:  Procedure Laterality Date   BLADDER SURGERY  1978   nocturnal enuresis treatment   CESAREAN SECTION  02-03-06   Dr Quincy Simmonds   DILATATION & CURETTAGE/HYSTEROSCOPY WITH MYOSURE N/A 01/06/2016   Procedure: DILATATION & CURETTAGE/HYSTEROSCOPY WITH Jacklynn Barnacle;  Surgeon: Nunzio Cobbs, MD;  Location: [facility];  Service: Gynecology;  Laterality: N/A;   DILATION AND CURETTAGE OF UTERUS  1990   MAB?   PORTACATH PLACEMENT Left 12/15/2021   Procedure: INSERTION PORT-A-CATH;  Surgeon: Coralie Keens, MD;  Location: [facility];  Service: General;  Laterality: Left;   Patient Active Problem List   Diagnosis Date Noted   Port-A-Cath in place 12/16/2021   Family history of breast cancer 12/04/2021   Family history of ovarian cancer 12/04/2021   Genetic testing 12/04/2021   Malignant neoplasm of upper-outer quadrant of right female breast ([place]) 12/01/2021    Breast lump on right side at 10 o'clock position 10/21/2021   PTSD (post-traumatic stress disorder)     PCP: [name], NP  REFERRING PROVIDER: Coralie Keens, MD  REFERRING DIAG: Right breast Cancer   THERAPY DIAG:  Muscle weakness (generalized)  Abnormal posture  Pain in right arm  Generalized edema  Malignant neoplasm of upper-outer quadrant of right breast in female, estrogen receptor positive ([place])  Rationale for Evaluation and Treatment Rehabilitation  ONSET DATE: 11/17/2021  SUBJECTIVE:  SUBJECTIVE STATEMENT: I have been working on my diet and it seems to be making me feel better.  I thought we were just doing the screen today.  PERTINENT HISTORY:  Patient was diagnosed on 11/17/2021 with right grade grade II invasive ductal and lobular carcinoma breast cancer.. It measures 2.4 cm and is located in the upper outer quadrant. It is triple positive with a Ki67 of 15%. Radiation completed end of October.     05/24/2022- bilateral breast reduction   PATIENT GOALS:  Reassess how my recovery is going related to arm function, pain, and swelling.  PAIN:  Evaluation: Are you having pain? Yes: NPRS scale: 4/10 Pain location: Rt lateral trunk  Pain description: burning  Aggravating factors: nothing  Relieving factors: PT visits  PRECAUTIONS: Recent Surgery, right UE Lymphedema risk, None  ACTIVITY LEVEL / LEISURE: She goes to the gym and lifts weights and rides a stationary bike for 7 miles (30 min) several times per week   OBJECTIVE:   PATIENT SURVEYS:  Breast Complaints: Pain in operated breast- 8 A feeling of heaviness in the operate breast-9 A swollen breast at the operated side-9 The skin feels tensed at the operated breast-10 Redness of the skin at the operated  breast-6 A print of my bra is visible at the operated breast-0 The pores of the skin at the operate breast are enlarged-4  The operated breast feels hard at some place- 10 56/80 on 10/25/22  OBSERVATIONS: Right breast has noticeable swelling with enlarged pores and nipple. The scar is healing as expected. Left breast had some hardening in the lower midline quadrant.   POSTURE:   Forward head and rounded shoulders  LYMPHEDEMA ASSESSMENT:     UPPER EXTREMITY AROM/PROM:   A/PROM Right 12/02/2021 Left 12/02/2021 Right 09/01/2022 Left  09/01/2022  Shoulder extension 47 49 60 60  Shoulder flexion 150 146 180 179  Shoulder abduction 157 160 171 167  Shoulder internal rotation 60 60 65 72  Shoulder external rotation 84 85 90 90                          (Blank rows = not tested)    CERVICAL AROM: All within normal limits   UPPER EXTREMITY STRENGTH: WNL    LYMPHEDEMA ASSESSMENTS:    LANDMARK RIGHT 12/02/2021 LEFT 12/02/2021 Right  09/01/2022 Left  09/01/2022  10 cm proximal to olecranon process 31.1 32.8 30 32  Olecranon process 26.3 26.7 25.8 25.8  10 cm proximal to ulnar styloid process 23.6 22.2 21.2 19.9  Just proximal to ulnar styloid process 16.4 16.3 15.6 16.2  Across hand at thumb web space 19.9 19.3 19.4 19  At base of 2nd digit 6.5 6.'3 6 6  '$ (Blank rows = not tested)   Surgery type/Date: Right Lumpectomy and Left benign Lumpectomy:05/05/2022 Number of lymph nodes removed: 2/2  Current/past treatment (chemo, radiation, hormone therapy): radiation, chemo  Other symptoms:  Heaviness/tightness Yes Pain Yes Pitting edema No Infections No  PATIENT EDUCATION:  Education details: Right breast MLD, Exercise after surgery, Lymphedema  Person educated: Patient Education method: Explanation, Demonstration, and Handouts Education comprehension: verbalized understanding and returned demonstration  HOME EXERCISE PROGRAM: Self MLD for right breast   TODAY'S  TREATMENT: Pt permission and consent throughout each step of examination and treatment with modification and draping if requested when working on sensitive areas  11/22/22 Redid SOZO and rescheduled next SOZO TE: Pulleys x 19mn abduction Sidelying open book 3x15" bil  Mermaid stretch 2x20" left only  Discussed getting up from floor strengthening and gave pt green band: sit to stand, wall slides, bridge, side steps and monster walks with green band with performance of each x 3 Manual: In supine: Short neck, 5 diaphragmatic breaths, L axillary nodes and establishment of interaxillary pathway, R inguinal nodes and establishment of axilloinguinal pathway, then R breast moving fluid towards pathways spending extra time in any areas of fibrosis then retracing all steps. Working in side lying along the posterior interaxillary pathways.   11/15/22 TE: Pulleys x 33mn flexion and abduction Supine snow angels easy Sidleying open book 3x15" bil Doorway easy Mermaid stretch 2x20" left only  Manual: In supine: Short neck, 5 diaphragmatic breaths, L axillary nodes and establishment of interaxillary pathway, R inguinal nodes and establishment of axilloinguinal pathway, then R breast moving fluid towards pathways spending extra time in any areas of fibrosis then retracing all steps. Working in side lying along the posterior interaxillary pathways.   11/04/2022: Manual: In supine: Short neck, 5 diaphragmatic breaths, L axillary nodes and establishment of interaxillary pathway, R inguinal nodes and establishment of axilloinguinal pathway, then R breast moving fluid towards pathways spending extra time in any areas of fibrosis then retracing all steps. Working in side lying along the posterior interaxillary pathways.  Discussed onset of lower extremity swelling could be related to herceptin and CVI and that the treatment is compression stockings, elevation and exercise.   Measured calf: 38.5cm, ankle: 22cm and  length 43cm and emailed [facility] to make sure which stockings come in pairs.  Will message pt with instruction but will use her chemo socks for now.    ASSESSMENT: CLINICAL IMPRESSION: Pt reports she thought she was just here for a screening today but was agreeable to treatment after discussion.  Rescheduled SOZO out appropriately.  Pt is now ind with MLD, using compression, feels no real pull with stretches except for open book, and continues to have no real change in the burning feeling in the lateral breast.  Discussed how it may be more of a time component for healing as she is feeling fine otherwise.  Pt will return in 2 weeks to see if it changed at all over 2 weeks.   Pt will benefit from skilled therapeutic intervention to improve on the following deficits: Decreased knowledge of precautions, impaired UE functional use, pain, decreased ROM, postural dysfunction.   PT treatment/interventions: ADL/Self care home management, Therapeutic exercises, Therapeutic activity, Neuromuscular re-education, Balance training, Gait training, Patient/Family education, Self Care, Joint mobilization, Dry Needling, Cryotherapy, Moist heat, Manual lymph drainage, scar mobilization, Taping, and Manual therapy   GOALS: Goals reviewed with patient? Yes  LONG TERM GOALS:  (STG=LTG)  GOALS Name Target Date  Goal status  1 Pt will demonstrate she has regained full shoulder ROM and function post operatively compared to baselines.  Baseline: 09/01/2022 MET  2  Pt will report a dash score of 4.55% to an increase in functional ability and reduction of pain. 11/24/2021 INITIAL  3 Pt will report 75% reduction of pain due to improvements in posture, strength, and muscle length  11/24/2021 INITIAL  4 Patient will be Independent with advance HEP 11/24/2021 INITIAL     PLAN:  PT FREQUENCY/DURATION: 1-2x per week x 6 weeks   PLAN FOR NEXT SESSION:review self MLD, scar work,  breast MLD, gentle stretching   [facility]  [address]  [place] [id number] ([phone] After Breast Cancer  Class It is recommended you attend the ABC class to be educated on lymphedema risk reduction. This class is free of charge and lasts for 1 hour. It is a 1-time class. You will need to download the Webex app either on your phone or computer. We will send you a link the night before or the morning of the class. You should be able to click on that link to join the class. This is not a confidential class. You don't have to turn your camera on, but other participants may be able to see your email address.  Scar massage You can begin gentle scar massage to you incision sites. Gently place one hand on the incision and move the skin (without sliding on the skin) in various directions. Do this for a few minutes and then you can gently massage either coconut oil or vitamin E cream into the scars.  Compression garment You should continue wearing your compression bra until you feel like you no longer have swelling.  Home exercise Program Continue doing the exercises you were given until you feel like you can do them without feeling any tightness at the end.   Walking Program Studies show that 30 minutes of walking per day (fast enough to elevate your heart rate) can significantly reduce the risk of a cancer recurrence. If you can't walk due to other medical reasons, we encourage you to find another activity you could do (like a stationary bike or water exercise).  Posture After breast cancer surgery, people frequently sit with rounded shoulders posture because it puts their incisions on slack and feels better. If you sit like this and scar tissue forms in that position, you can become very tight and have pain sitting or standing with good posture. Try to be aware of your posture and sit and stand up tall to heal properly.  Follow up PT: It is recommended you return every 3 months for the first 3 years  following surgery to be assessed on the SOZO machine for an L-Dex score. This helps prevent clinically significant lymphedema in 95% of patients. These follow up screens are 10 minute appointments that you are not billed for.   Shan Levans, PT  11/22/2022  10:42 AM

## 2022-11-22 NOTE — Progress Notes (Signed)

## 2022-11-22 NOTE — Telephone Encounter (Signed)
Spoke with Patient regarding Breat cancer diagnosis. Breast surgical procedures in 2023, And 6 weeks of Chemotherapy that ended in October 2023.  Patient is currently taking Tamoxifen to treat her breast cancer.  She has 2 upcoming treatments on 11/25/22 & 12/16/22.  Her ECHO from 09/15/22 are the following results, EF=55% & No Aortic stenosis.

## 2022-11-22 NOTE — Telephone Encounter (Signed)
Per Ezzard Flax who spoke with PV nurse, patient was reached for her pre visit appointment.

## 2022-11-23 ENCOUNTER — Encounter: Payer: Self-pay | Admitting: Hematology and Oncology

## 2022-11-23 ENCOUNTER — Other Ambulatory Visit: Payer: Self-pay

## 2022-11-25 ENCOUNTER — Inpatient Hospital Stay: Payer: BC Managed Care – PPO | Attending: Hematology and Oncology

## 2022-11-25 ENCOUNTER — Other Ambulatory Visit: Payer: Self-pay | Admitting: Surgery

## 2022-11-25 ENCOUNTER — Inpatient Hospital Stay: Payer: BC Managed Care – PPO

## 2022-11-25 ENCOUNTER — Inpatient Hospital Stay (HOSPITAL_BASED_OUTPATIENT_CLINIC_OR_DEPARTMENT_OTHER): Payer: BC Managed Care – PPO | Admitting: Hematology and Oncology

## 2022-11-25 VITALS — BP 133/87 | HR 79 | Temp 97.7°F | Resp 16 | Wt 186.0 lb

## 2022-11-25 VITALS — BP 127/79 | HR 91 | Resp 18

## 2022-11-25 DIAGNOSIS — Z9221 Personal history of antineoplastic chemotherapy: Secondary | ICD-10-CM | POA: Insufficient documentation

## 2022-11-25 DIAGNOSIS — C50411 Malignant neoplasm of upper-outer quadrant of right female breast: Secondary | ICD-10-CM | POA: Diagnosis not present

## 2022-11-25 DIAGNOSIS — Z170 Estrogen receptor positive status [ER+]: Secondary | ICD-10-CM | POA: Diagnosis not present

## 2022-11-25 DIAGNOSIS — Z923 Personal history of irradiation: Secondary | ICD-10-CM | POA: Insufficient documentation

## 2022-11-25 DIAGNOSIS — Z5112 Encounter for antineoplastic immunotherapy: Secondary | ICD-10-CM | POA: Insufficient documentation

## 2022-11-25 DIAGNOSIS — Z79810 Long term (current) use of selective estrogen receptor modulators (SERMs): Secondary | ICD-10-CM | POA: Diagnosis not present

## 2022-11-25 DIAGNOSIS — Z95828 Presence of other vascular implants and grafts: Secondary | ICD-10-CM

## 2022-11-25 LAB — CBC WITH DIFFERENTIAL (CANCER CENTER ONLY)
Abs Immature Granulocytes: 0.01 10*3/uL (ref 0.00–0.07)
Basophils Absolute: 0 10*3/uL (ref 0.0–0.1)
Basophils Relative: 1 %
Eosinophils Absolute: 0.1 10*3/uL (ref 0.0–0.5)
Eosinophils Relative: 3 %
HCT: 36.8 % (ref 36.0–46.0)
Hemoglobin: 13.1 g/dL (ref 12.0–15.0)
Immature Granulocytes: 0 %
Lymphocytes Relative: 37 %
Lymphs Abs: 1.4 10*3/uL (ref 0.7–4.0)
MCH: 34.5 pg — ABNORMAL HIGH (ref 26.0–34.0)
MCHC: 35.6 g/dL (ref 30.0–36.0)
MCV: 96.8 fL (ref 80.0–100.0)
Monocytes Absolute: 0.3 10*3/uL (ref 0.1–1.0)
Monocytes Relative: 7 %
Neutro Abs: 2 10*3/uL (ref 1.7–7.7)
Neutrophils Relative %: 52 %
Platelet Count: 200 10*3/uL (ref 150–400)
RBC: 3.8 MIL/uL — ABNORMAL LOW (ref 3.87–5.11)
RDW: 11.5 % (ref 11.5–15.5)
WBC Count: 3.8 10*3/uL — ABNORMAL LOW (ref 4.0–10.5)
nRBC: 0 % (ref 0.0–0.2)

## 2022-11-25 LAB — CMP (CANCER CENTER ONLY)
ALT: 12 U/L (ref 0–44)
AST: 15 U/L (ref 15–41)
Albumin: 4 g/dL (ref 3.5–5.0)
Alkaline Phosphatase: 63 U/L (ref 38–126)
Anion gap: 8 (ref 5–15)
BUN: 12 mg/dL (ref 6–20)
CO2: 24 mmol/L (ref 22–32)
Calcium: 9 mg/dL (ref 8.9–10.3)
Chloride: 108 mmol/L (ref 98–111)
Creatinine: 0.71 mg/dL (ref 0.44–1.00)
GFR, Estimated: >60 mL/min (ref >60)
Glucose, Bld: 78 mg/dL (ref 70–99)
Potassium: 4.1 mmol/L (ref 3.5–5.1)
Sodium: 140 mmol/L (ref 135–145)
Total Bilirubin: 0.3 mg/dL (ref 0.3–1.2)
Total Protein: 6.7 g/dL (ref 6.5–8.1)

## 2022-11-25 MED ORDER — ACETAMINOPHEN 325 MG PO TABS
650.0000 mg | ORAL_TABLET | Freq: Once | ORAL | Status: AC
  Administered 2022-11-25: 650 mg via ORAL
  Filled 2022-11-25: qty 2

## 2022-11-25 MED ORDER — HEPARIN SOD (PORK) LOCK FLUSH 100 UNIT/ML IV SOLN
500.0000 [IU] | Freq: Once | INTRAVENOUS | Status: AC | PRN
  Administered 2022-11-25: 500 [IU]

## 2022-11-25 MED ORDER — TRASTUZUMAB-DKST CHEMO 150 MG IV SOLR
6.0000 mg/kg | Freq: Once | INTRAVENOUS | Status: AC
  Administered 2022-11-25: 483 mg via INTRAVENOUS
  Filled 2022-11-25: qty 23

## 2022-11-25 MED ORDER — SODIUM CHLORIDE 0.9% FLUSH
10.0000 mL | Freq: Once | INTRAVENOUS | Status: AC
  Administered 2022-11-25: 10 mL

## 2022-11-25 MED ORDER — SODIUM CHLORIDE 0.9% FLUSH
10.0000 mL | INTRAVENOUS | Status: DC | PRN
  Administered 2022-11-25: 10 mL

## 2022-11-25 MED ORDER — LORATADINE 10 MG PO TABS
10.0000 mg | ORAL_TABLET | Freq: Once | ORAL | Status: AC
  Administered 2022-11-25: 10 mg via ORAL
  Filled 2022-11-25: qty 1

## 2022-11-25 MED ORDER — SODIUM CHLORIDE 0.9 % IV SOLN: Freq: Once | INTRAVENOUS | Status: AC

## 2022-11-25 NOTE — Progress Notes (Signed)
Patient Care Team: Pcp, No as PCP - General Branch, Royetta Crochet, MD as PCP - Cardiology (Cardiology) Coralie Keens, MD as Consulting Physician (General Surgery) Nicholas Lose, MD as Consulting Physician (Hematology and Oncology) Gery Pray, MD as Consulting Physician (Radiation Oncology)  DIAGNOSIS:  Encounter Diagnosis  Name Primary?   Malignant neoplasm of upper-outer quadrant of right breast in female, estrogen receptor positive ([place]) Yes    SUMMARY OF ONCOLOGIC HISTORY: Oncology History  Malignant neoplasm of upper-outer quadrant of right female breast ([place])  11/19/2021 Initial Diagnosis   Palpable lump for 3 weeks, diagnostic mammogram: 2.2 cm x 2.2 cm irregular mass with a spiculated margin in the upper outer right breast. 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 HER2 positive, Ki-67 15%   12/02/2021 Cancer Staging   Staging form: Breast, AJCC 8th Edition - Clinical stage from 12/02/2021: Stage IB (cT2, cN0, cM0, G2, ER+, PR+, HER2+) - Signed by Nicholas Lose, MD on 12/02/2021 Stage prefix: Initial diagnosis Histologic grading system: 3 grade system   12/16/2021 - 05/12/2022 Chemotherapy   Patient is on Treatment Plan : BREAST  Docetaxel + Carboplatin + Trastuzumab + Pertuzumab  (TCHP) q21d      01/22/2022 Genetic Testing   Negative hereditary cancer genetic testing: no pathogenic variants detected in [place] +RNAinsight Panel.  Report date is 01/22/2022.   The CancerNext gene panel offered by [employer] includes sequencing, rearrangement analysis, and RNA analysis for the following 36 genes:   APC, ATM, AXIN2, BARD1, BMPR1A, BRCA1, BRCA2, BRIP1, CDH1, CDK4, CDKN2A, CHEK2, DICER1, HOXB13, EPCAM, GREM1, MLH1, MSH2, MSH3, MSH6, MUTYH, NBN, NF1, NTHL1, PALB2, PMS2, POLD1, POLE, PTEN, RAD51C, RAD51D, RECQL, SMAD4, SMARCA4, STK11, and TP53.    04/22/2022 -  Chemotherapy   Patient is on Treatment Plan : BREAST MAINTENANCE Trastuzumab IV (6) or SQ (600) D1 q21d X 11  Cycles     05/05/2022 Surgery   Rt retroareolar lumpectomy and Rt UOQ lumpectomy: No residual cancer, Lt Lumpectomy: Benign Complete Pathological Response   06/04/2022 - 07/02/2022 Chemotherapy   Patient is on Treatment Plan : BREAST Trastuzumab  + Pertuzumab q21d x 13 cycles     07/16/2022 - 08/23/2022 Radiation Therapy   50.4 Gy in 28 treatments to Right Breast   09/2022 -  Anti-estrogen oral therapy   Tamoxifen x 5-10 years     CHIEF COMPLIANT: Follow-up right breast cancer herceptin maintenance    INTERVAL HISTORY: Julie Atkinson is a 52 y.o. with above-mentioned history of right breast cancer, currently on chemotherapy with TCHP and tamoxifen. She presents to the clinic today for follow-up. She reports that she wiped out twice. She fell last night and she tore her muscle. She believes it was her shoes. Denies hot flashes or any other symptoms. She is tolerating the tamoxifen extremely well with no complaints or concerns.   ALLERGIES:  is allergic to iodinated contrast media, latex, and codeine.  MEDICATIONS:  Current Outpatient Medications  Medication Sig Dispense Refill   acetaminophen (TYLENOL) 500 MG tablet Take 500 mg by mouth every 6 (six) hours as needed.     Ascorbic Acid (VITAMIN C) 100 MG tablet Take 100 mg by mouth daily.     clindamycin (CLINDAGEL) 1 % gel Apply topically 2 (two) times daily. As needed     diltiazem (CARDIZEM) 30 MG tablet Take 1 tablet (30 mg total) by mouth every 6 (six) hours as needed (as needed for palpitations). 120 tablet 3   lidocaine-prilocaine (EMLA) cream Apply  1 Application topically as needed. 30 g 1   tamoxifen (NOLVADEX) 20 MG tablet Take 1 tablet (20 mg total) by mouth daily. 90 tablet 3   thiamine (VITAMIN B-1) 50 MG tablet Take 50 mg by mouth daily.     No current facility-administered medications for this visit.    PHYSICAL EXAMINATION: ECOG PERFORMANCE STATUS: 1 - Symptomatic but completely ambulatory  Vitals:   11/25/22 1010   BP: 133/87  Pulse: 79  Resp: 16  Temp: 97.7 F (36.5 C)  SpO2: 99%   Filed Weights   11/25/22 1010  Weight: 186 lb (84.4 kg)      LABORATORY DATA:  I have reviewed the data as listed    Latest Ref Rng & Units 11/25/2022    9:23 AM 11/03/2022    2:04 PM 09/03/2022    9:47 AM  CMP  Glucose 70 - 99 mg/dL 78  129  89   BUN 6 - 20 mg/dL '12  13  15   '$ Creatinine 0.44 - 1.00 mg/dL 0.71  0.62  0.79   Sodium 135 - 145 mmol/L 140  139  140   Potassium 3.5 - 5.1 mmol/L 4.1  3.5  4.2   Chloride 98 - 111 mmol/L 108  110  107   CO2 22 - 32 mmol/L '24  22  26   '$ Calcium 8.9 - 10.3 mg/dL 9.0  9.0  9.2   Total Protein 6.5 - 8.1 g/dL 6.7  7.0  7.5   Total Bilirubin 0.3 - 1.2 mg/dL 0.3  0.6  0.6   Alkaline Phos 38 - 126 U/L 63  57  62   AST 15 - 41 U/L '15  17  16   '$ ALT 0 - 44 U/L '12  14  15     '$ Lab Results  Component Value Date   WBC 3.8 (L) 11/25/2022   HGB 13.1 11/25/2022   HCT 36.8 11/25/2022   MCV 96.8 11/25/2022   PLT 200 11/25/2022   NEUTROABS 2.0 11/25/2022    ASSESSMENT & PLAN:  Malignant neoplasm of upper-outer quadrant of right female breast ([place]) 11/19/2021:Palpable lump for 3 weeks, diagnostic mammogram: 2.2 cm x 2.2 cm irregular mass with a spiculated margin in the upper outer right breast. 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 HER2 positive, Ki-67 15% MRI Breast 12/09/21:  Right Breast retroareolar mass 6.7 cm (involves nipple areolar complex), Rt axilla 2.1 cm node, Left breast: Several scattered foci left breast indeterminate 0.6 cm mass LIQ left breast and another indeterminate 0.6 cm mass in upper central Left breast   Treatment Plan: 1. Neoadjuvant chemotherapy with TCHP foll by Herceptin maintenance   2. 05/05/22: Bilateral lumpectomies: Path CR 3. Followed by adjuvant radiation therapy 07/16/2022-08/23/2022 4.  Adjuvant antiestrogen therapy with tamoxifen started  10/04/2022 ------------------------------------------------------------------------------------------------------------------------------------ Current Treatment: Herceptin maintenance (today is her last treatment) Herceptin toxicities:  Monitoring closely for toxicities  Echocardiogram 09/15/2022: EF 55%   Tamoxifen toxicities: Tolerating it extremely well without any problems or concerns.  Breast cancer surveillance: Mammogram [facility]/16/2022 [facility]: Postoperative changes of the bilateral breasts ultrasound performed to evaluate hardening and soreness of breast: Benign, density category B   Return to clinic in 6 months for follow-up   No orders of the defined types were placed in this encounter.  The patient has a good understanding of the overall plan. she agrees with it. she will call with any problems that may develop before the next visit here. Total time spent: 30  mins including face to face time and time spent for planning, charting and co-ordination of care   Harriette Ohara, MD 11/25/22    I Gardiner Coins am acting as a Education administrator for Dr.Braylin Xu  I have reviewed the above documentation for accuracy and completeness, and I agree with the above.

## 2022-11-25 NOTE — Assessment & Plan Note (Addendum)
11/19/2021:Palpable lump for 3 weeks, diagnostic mammogram: 2.2 cm x 2.2 cm irregular mass with a spiculated margin in the upper outer right breast. 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 HER2 positive, Ki-67 15% MRI Breast 12/09/21:  Right Breast retroareolar mass 6.7 cm (involves nipple areolar complex), Rt axilla 2.1 cm node, Left breast: Several scattered foci left breast indeterminate 0.6 cm mass LIQ left breast and another indeterminate 0.6 cm mass in upper central Left breast   Treatment Plan: 1. Neoadjuvant chemotherapy with TCHP foll by Herceptin maintenance   2. 05/05/22: Bilateral lumpectomies: Path CR 3. Followed by adjuvant radiation therapy 07/16/2022-08/23/2022 4.  Adjuvant antiestrogen therapy with tamoxifen started 10/04/2022 ------------------------------------------------------------------------------------------------------------------------------------ Current Treatment: Herceptin maintenance (today is her last treatment) Herceptin toxicities:  Monitoring closely for toxicities  Echocardiogram 09/15/2022: EF 55%   Tamoxifen toxicities: Tolerating it extremely well without any problems or concerns.  Breast cancer surveillance: Mammogram 11/16/2022 at [facility]: Postoperative changes of the bilateral breasts ultrasound performed to evaluate hardening and soreness of breast: Benign, density category B   Return to clinic in 6 months for follow-up

## 2022-11-25 NOTE — Patient Instructions (Signed)
[facility]  Discharge Instructions: Thank you for choosing [facility] to provide your oncology and hematology care.   If you have a lab appointment with the [facility], please go directly to the [facility] and check in at the registration area.   Wear comfortable clothing and clothing appropriate for easy access to any Portacath or PICC line.   We strive to give you quality time with your provider. You may need to reschedule your appointment if you arrive late (15 or more minutes).  Arriving late affects you and other patients whose appointments are after yours.  Also, if you miss three or more appointments without notifying the office, you may be dismissed from the clinic at the provider's discretion.      For prescription refill requests, have your pharmacy contact our office and allow 72 hours for refills to be completed.    Today you received the following chemotherapy and/or immunotherapy agents herceptin      To help prevent nausea and vomiting after your treatment, we encourage you to take your nausea medication as directed.  BELOW ARE SYMPTOMS THAT SHOULD BE REPORTED IMMEDIATELY: *FEVER GREATER THAN 100.4 F (38 C) OR HIGHER *CHILLS OR SWEATING *NAUSEA AND VOMITING THAT IS NOT CONTROLLED WITH YOUR NAUSEA MEDICATION *UNUSUAL SHORTNESS OF BREATH *UNUSUAL BRUISING OR BLEEDING *URINARY PROBLEMS (pain or burning when urinating, or frequent urination) *BOWEL PROBLEMS (unusual diarrhea, constipation, pain near the anus) TENDERNESS IN MOUTH AND THROAT WITH OR WITHOUT PRESENCE OF ULCERS (sore throat, sores in mouth, or a toothache) UNUSUAL RASH, SWELLING OR PAIN  UNUSUAL VAGINAL DISCHARGE OR ITCHING   Items with * indicate a potential emergency and should be followed up as soon as possible or go to the Emergency Department if any problems should occur.  Please show the CHEMOTHERAPY ALERT CARD or IMMUNOTHERAPY ALERT CARD at check-in to  the Emergency Department and triage nurse.  Should you have questions after your visit or need to cancel or reschedule your appointment, please contact [facility]  Dept: [phone]  and follow the prompts.  Office hours are 8:00 a.m. to 4:30 p.m. Monday - Friday. Please note that voicemails left after 4:00 p.m. may not be returned until the following business day.  We are closed weekends and major holidays. You have access to a nurse at all times for urgent questions. Please call the main number to the clinic Dept: [phone] and follow the prompts.   For any non-urgent questions, you may also contact your provider using MyChart. We now offer e-Visits for anyone 84 and older to request care online for non-urgent symptoms. For details visit [email or web address].   Also download the MyChart app! Go to the app store, search "MyChart", open the app, select [facility], and log in with your MyChart username and password.

## 2022-11-29 ENCOUNTER — Telehealth: Payer: Self-pay | Admitting: Hematology and Oncology

## 2022-11-29 NOTE — Telephone Encounter (Signed)
Scheduled appointment per 1/18 los. Left voicemail.

## 2022-11-30 ENCOUNTER — Other Ambulatory Visit: Payer: Self-pay

## 2022-11-30 ENCOUNTER — Encounter: Payer: BC Managed Care – PPO | Admitting: Rehabilitation

## 2022-11-30 ENCOUNTER — Ambulatory Visit (HOSPITAL_BASED_OUTPATIENT_CLINIC_OR_DEPARTMENT_OTHER)
Admission: RE | Admit: 2022-11-30 | Discharge: 2022-11-30 | Disposition: A | Discharge status: Home / Self Care | Payer: BC Managed Care – PPO | Source: Ambulatory Visit | Attending: Obstetrics & Gynecology | Admitting: Obstetrics & Gynecology

## 2022-11-30 DIAGNOSIS — Z13820 Encounter for screening for osteoporosis: Secondary | ICD-10-CM | POA: Insufficient documentation

## 2022-11-30 DIAGNOSIS — M85851 Other specified disorders of bone density and structure, right thigh: Secondary | ICD-10-CM | POA: Diagnosis not present

## 2022-12-06 ENCOUNTER — Encounter: Payer: Self-pay | Admitting: Rehabilitation

## 2022-12-06 ENCOUNTER — Encounter: Payer: Self-pay | Admitting: Gastroenterology

## 2022-12-06 ENCOUNTER — Ambulatory Visit: Payer: BC Managed Care – PPO | Admitting: Rehabilitation

## 2022-12-06 DIAGNOSIS — M6281 Muscle weakness (generalized): Secondary | ICD-10-CM | POA: Diagnosis not present

## 2022-12-06 DIAGNOSIS — M79601 Pain in right arm: Secondary | ICD-10-CM

## 2022-12-06 DIAGNOSIS — R601 Generalized edema: Secondary | ICD-10-CM | POA: Diagnosis not present

## 2022-12-06 DIAGNOSIS — C50411 Malignant neoplasm of upper-outer quadrant of right female breast: Secondary | ICD-10-CM

## 2022-12-06 DIAGNOSIS — R293 Abnormal posture: Secondary | ICD-10-CM | POA: Diagnosis not present

## 2022-12-06 DIAGNOSIS — Z170 Estrogen receptor positive status [ER+]: Secondary | ICD-10-CM | POA: Diagnosis not present

## 2022-12-06 NOTE — Therapy (Signed)
[facility] OUTPATIENT PHYSICAL THERAPY BREAST CANCER TREATMENT   Patient Name: Julie Atkinson MRN: [id number] DOB:[birth year], 52 y.o., female Today's Date: 12/06/2022   PT End of Session - 12/06/22 0955     Visit Number 9    Number of Visits 13    Date for PT Re-Evaluation 12/06/22    PT Start Time 1000    PT Stop Time 1046    PT Time Calculation (min) 46 min    Activity Tolerance Patient tolerated treatment well    Behavior During Therapy [facility] for tasks assessed/performed                  Past Medical History:  Diagnosis Date   Cancer ([place])    Clotting disorder ([place])    Family history of breast cancer 12/04/2021   Family history of ovarian cancer 12/04/2021   Fibroid Feb 08, 2014   8 mm   History of posttraumatic stress disorder (PTSD) 02-09-2008   due to maternal death-MI   History of radiation therapy    Right breast- 07/15/22-08/23/22- Dr. Gery Pray   Hyperlipidemia    PTSD (post-traumatic stress disorder)    mother's death 02-09-08; seizure and AMI while driving, patient performed CPR x 45 minutes   Seropositive for herpes simplex 2 infection Feb 09, 2016   Past Surgical History:  Procedure Laterality Date   BLADDER SURGERY  11/08/1976   nocturnal enuresis treatment   Breast biopsies  08-Feb-2022   BREAST LUMPECTOMY WITH RADIOACTIVE SEED AND AXILLARY LYMPH NODE DISSECTION  05/03/2022   CESAREAN SECTION  11/08/2005   Dr Quincy Simmonds   DILATATION & CURETTAGE/HYSTEROSCOPY WITH MYOSURE N/A 01/06/2016   Procedure: DILATATION & CURETTAGE/HYSTEROSCOPY WITH Jacklynn Barnacle;  Surgeon: Nunzio Cobbs, MD;  Location: [facility];  Service: Gynecology;  Laterality: N/A;   DILATION AND CURETTAGE OF UTERUS  11/08/1988   MAB?   Lumpectomies     X 6   PORTACATH PLACEMENT Left 12/15/2021   Procedure: INSERTION PORT-A-CATH;  Surgeon: Coralie Keens, MD;  Location: [facility];  Service: General;  Laterality: Left;   Total Breast reduction  06/2022   Patient Active Problem List   Diagnosis Date Noted    Port-A-Cath in place 12/16/2021   Family history of breast cancer 12/04/2021   Family history of ovarian cancer 12/04/2021   Genetic testing 12/04/2021   Malignant neoplasm of upper-outer quadrant of right female breast ([place]) 12/01/2021   Breast lump on right side at 10 o'clock position 10/21/2021   PTSD (post-traumatic stress disorder)     PCP: [name], NP  REFERRING PROVIDER: Coralie Keens, MD  REFERRING DIAG: Right breast Cancer   THERAPY DIAG:  Muscle weakness (generalized)  Abnormal posture  Pain in right arm  Malignant neoplasm of upper-outer quadrant of right breast in female, estrogen receptor positive ([place])  Generalized edema  Rationale for Evaluation and Treatment Rehabilitation  ONSET DATE: 11/17/2021  SUBJECTIVE:  SUBJECTIVE STATEMENT: It feels about the same.  Back to the gym.    PERTINENT HISTORY:  Patient was diagnosed on 11/17/2021 with right grade grade II invasive ductal and lobular carcinoma breast cancer.. It measures 2.4 cm and is located in the upper outer quadrant. It is triple positive with a Ki67 of 15%. Radiation completed end of October.     05/24/2022- bilateral breast reduction   PATIENT GOALS:  Reassess how my recovery is going related to arm function, pain, and swelling.  PAIN:  Evaluation: Are you having pain? Yes: NPRS scale: 6/10 Pain location: Rt lateral trunk  Pain description: burning  Aggravating factors: nothing  Relieving factors: PT visits  PRECAUTIONS: Recent Surgery, right UE Lymphedema risk, None  ACTIVITY LEVEL / LEISURE: She goes to the gym and lifts weights and rides a stationary bike for 7 miles (30 min) several times per week   OBJECTIVE:   PATIENT SURVEYS:  Breast Complaints: Pain in operated breast- 8 - 7 A  feeling of heaviness in the operate breast-9 - 7 A swollen breast at the operated side-9 - 7 The skin feels tensed at the operated breast-10 - 10 Redness of the skin at the operated breast-6 - 7 A print of my bra is visible at the operated breast-0 - 0 The pores of the skin at the operate breast are enlarged-4 - 2 The operated breast feels hard at some place- 10 - 10 56/80 on 10/25/22 -  50/80 on 12/06/22  OBSERVATIONS: Right breast has noticeable swelling with enlarged pores and nipple. The scar is healing as expected. Left breast had some hardening in the lower midline quadrant.   POSTURE:   Forward head and rounded shoulders  LYMPHEDEMA ASSESSMENT:     UPPER EXTREMITY AROM/PROM:   A/PROM Right 12/02/2021 Left 12/02/2021 Right 09/01/2022 Left  09/01/2022  Shoulder extension 47 49 60 60  Shoulder flexion 150 146 180 179  Shoulder abduction 157 160 171 167  Shoulder internal rotation 60 60 65 72  Shoulder external rotation 84 85 90 90                          (Blank rows = not tested)    CERVICAL AROM: All within normal limits   UPPER EXTREMITY STRENGTH: WNL    LYMPHEDEMA ASSESSMENTS:    LANDMARK RIGHT 12/02/2021 LEFT 12/02/2021 Right  09/01/2022 Left  09/01/2022  10 cm proximal to olecranon process 31.1 32.8 30 32  Olecranon process 26.3 26.7 25.8 25.8  10 cm proximal to ulnar styloid process 23.6 22.2 21.2 19.9  Just proximal to ulnar styloid process 16.4 16.3 15.6 16.2  Across hand at thumb web space 19.9 19.3 19.4 19  At base of 2nd digit 6.5 6.'3 6 6  '$ (Blank rows = not tested)   Surgery type/Date: Right Lumpectomy and Left benign Lumpectomy:05/05/2022 Number of lymph nodes removed: 2/2  Current/past treatment (chemo, radiation, hormone therapy): radiation, chemo  Other symptoms:  Heaviness/tightness Yes Pain Yes Pitting edema No Infections No  PATIENT EDUCATION:  Education details: Right breast MLD, Exercise after surgery, Lymphedema  Person educated:  Patient Education method: Explanation, Demonstration, and Handouts Education comprehension: verbalized understanding and returned demonstration  HOME EXERCISE PROGRAM: Self MLD for right breast   TODAY'S TREATMENT: Pt permission and consent throughout each step of examination and treatment with modification and draping if requested when working on sensitive areas  12/06/22 Redid goals and Breast edema questionnaire.   Manual: In  supine: Short neck, 5 diaphragmatic breaths, L axillary nodes and establishment of interaxillary pathway, R inguinal nodes and establishment of axilloinguinal pathway, then R breast moving fluid towards pathways spending extra time in any areas of fibrosis then retracing all steps.   11/22/22 Redid SOZO and rescheduled next SOZO TE: Pulleys x 59mn abduction Sidelying open book 3x15" bil Mermaid stretch 2x20" left only  Discussed getting up from floor strengthening and gave pt green band: sit to stand, wall slides, bridge, side steps and monster walks with green band with performance of each x 3 Manual: In supine: Short neck, 5 diaphragmatic breaths, L axillary nodes and establishment of interaxillary pathway, R inguinal nodes and establishment of axilloinguinal pathway, then R breast moving fluid towards pathways spending extra time in any areas of fibrosis then retracing all steps. Working in side lying along the posterior interaxillary pathways.   11/15/22 TE: Pulleys x 253m flexion and abduction Supine snow angels easy Sidleying open book 3x15" bil Doorway easy Mermaid stretch 2x20" left only  Manual: In supine: Short neck, 5 diaphragmatic breaths, L axillary nodes and establishment of interaxillary pathway, R inguinal nodes and establishment of axilloinguinal pathway, then R breast moving fluid towards pathways spending extra time in any areas of fibrosis then retracing all steps. Working in side lying along the posterior interaxillary pathways.    ASSESSMENT: CLINICAL IMPRESSION: Pt has decreased breast edema questionnaire by 6 points but is still high at a 50/80.  She does not feel much change overall but still feels much better after PT visits for a few days.  We will extend POC to have appts every 2 weeks x 8 weeks.    Pt will benefit from skilled therapeutic intervention to improve on the following deficits: Decreased knowledge of precautions, impaired UE functional use, pain, decreased ROM, postural dysfunction.   PT treatment/interventions: ADL/Self care home management, Therapeutic exercises, Therapeutic activity, Neuromuscular re-education, Balance training, Gait training, Patient/Family education, Self Care, Joint mobilization, Dry Needling, Cryotherapy, Moist heat, Manual lymph drainage, scar mobilization, Taping, and Manual therapy   GOALS: Goals reviewed with patient? Yes  LONG TERM GOALS:  (STG=LTG)  GOALS Name Target Date  Goal status  1 Pt will demonstrate she has regained full shoulder ROM and function post operatively compared to baselines.  Baseline: 09/01/2022 MET  2  Pt will report a dash score of 4.55% to an increase in functional ability and reduction of pain. 11/24/2021 MET  3 Pt will report 75% reduction of pain due to improvements in posture, strength, and muscle length  11/24/2021 MET in regards to the shoulders  4 Patient will be Independent with advance HEP 11/24/2021 ONGOING  5 Pt will be ind with self MLD and compression for the left breast 01/31/23 NEW  6 Pt will decrease breast edema questionnaire to 40/50 to demonstrate decreased symptoms of lymphedema 01/31/23 NEW     PLAN:  PT FREQUENCY/DURATION: 1-2x per week x 6 weeks   PLAN FOR NEXT SESSION:review self MLD, scar work,  breast MLD, gentle stretching   [facility]  [address]  [place] [id number]([phone] Breast Cancer Class It is recommended you attend the ABC class to be educated on  lymphedema risk reduction. This class is free of charge and lasts for 1 hour. It is a 1-time class. You will need to download the Webex app either on your phone or computer. We will send you a link the night before or the morning  of the class. You should be able to click on that link to join the class. This is not a confidential class. You don't have to turn your camera on, but other participants may be able to see your email address.  Scar massage You can begin gentle scar massage to you incision sites. Gently place one hand on the incision and move the skin (without sliding on the skin) in various directions. Do this for a few minutes and then you can gently massage either coconut oil or vitamin E cream into the scars.  Compression garment You should continue wearing your compression bra until you feel like you no longer have swelling.  Home exercise Program Continue doing the exercises you were given until you feel like you can do them without feeling any tightness at the end.   Walking Program Studies show that 30 minutes of walking per day (fast enough to elevate your heart rate) can significantly reduce the risk of a cancer recurrence. If you can't walk due to other medical reasons, we encourage you to find another activity you could do (like a stationary bike or water exercise).  Posture After breast cancer surgery, people frequently sit with rounded shoulders posture because it puts their incisions on slack and feels better. If you sit like this and scar tissue forms in that position, you can become very tight and have pain sitting or standing with good posture. Try to be aware of your posture and sit and stand up tall to heal properly.  Follow up PT: It is recommended you return every 3 months for the first 3 years following surgery to be assessed on the SOZO machine for an L-Dex score. This helps prevent clinically significant lymphedema in 95% of patients. These follow up screens are 10  minute appointments that you are not billed for.   Shan Levans, PT  12/06/2022  11:33 AM

## 2022-12-08 DIAGNOSIS — Z803 Family history of malignant neoplasm of breast: Secondary | ICD-10-CM | POA: Diagnosis not present

## 2022-12-08 DIAGNOSIS — Z9013 Acquired absence of bilateral breasts and nipples: Secondary | ICD-10-CM | POA: Diagnosis not present

## 2022-12-08 DIAGNOSIS — Z48817 Encounter for surgical aftercare following surgery on the skin and subcutaneous tissue: Secondary | ICD-10-CM | POA: Diagnosis not present

## 2022-12-09 DIAGNOSIS — C50811 Malignant neoplasm of overlapping sites of right female breast: Secondary | ICD-10-CM | POA: Diagnosis not present

## 2022-12-09 DIAGNOSIS — Z170 Estrogen receptor positive status [ER+]: Secondary | ICD-10-CM | POA: Diagnosis not present

## 2022-12-13 ENCOUNTER — Ambulatory Visit: Payer: BC Managed Care – PPO | Admitting: Rehabilitation

## 2022-12-14 ENCOUNTER — Encounter: Payer: Self-pay | Admitting: Gastroenterology

## 2022-12-14 ENCOUNTER — Ambulatory Visit (AMBULATORY_SURGERY_CENTER): Payer: BC Managed Care – PPO | Admitting: Gastroenterology

## 2022-12-14 VITALS — BP 126/71 | HR 57 | Temp 97.1°F | Resp 17 | Ht 67.5 in | Wt 181.0 lb

## 2022-12-14 DIAGNOSIS — K635 Polyp of colon: Secondary | ICD-10-CM

## 2022-12-14 DIAGNOSIS — Z1211 Encounter for screening for malignant neoplasm of colon: Secondary | ICD-10-CM

## 2022-12-14 MED ORDER — SODIUM CHLORIDE 0.9 % IV SOLN: 500.0000 mL | Freq: Once | INTRAVENOUS | Status: DC

## 2022-12-14 NOTE — Progress Notes (Signed)
Called to room to assist during endoscopic procedure.  Patient ID and intended procedure confirmed with present staff. Received instructions for my participation in the procedure from the performing physician.  

## 2022-12-14 NOTE — Progress Notes (Signed)
Referring Provider: Thornton Park, MD Primary Care Physician:  Pcp, No  Indication for Colonoscopy:  Colon cancer screening   IMPRESSION:  Need for colon cancer screening Appropriate candidate for monitored anesthesia care  PLAN: Colonoscopy in the [facility] today   HPI: Julie Atkinson is a 52 y.o. female presents for screening colonoscopy.  No prior colonoscopy or colon cancer screening.  No known family history of colon cancer or polyps. No family history of uterine/endometrial cancer, pancreatic cancer or gastric/stomach cancer.   Past Medical History:  Diagnosis Date   Cancer [facility])    Clotting disorder ([place])    Family history of breast cancer 12/04/2021   Family history of ovarian cancer 12/04/2021   Fibroid 01/30/2014   8 mm   History of posttraumatic stress disorder (PTSD) 01-31-08   due to maternal death-MI   History of radiation therapy    Right breast- 07/15/22-08/23/22- Dr. Gery Pray   Hyperlipidemia    PTSD (post-traumatic stress disorder)    mother's death Jan 31, 2008; seizure and AMI while driving, patient performed CPR x 45 minutes   Seropositive for herpes simplex 2 infection 01/31/16    Past Surgical History:  Procedure Laterality Date   BLADDER SURGERY  11/08/1976   nocturnal enuresis treatment   Breast biopsies  01-30-22   BREAST LUMPECTOMY WITH RADIOACTIVE SEED AND AXILLARY LYMPH NODE DISSECTION  05/03/2022   CESAREAN SECTION  11/08/2005   Dr Quincy Simmonds   DILATATION & CURETTAGE/HYSTEROSCOPY WITH MYOSURE N/A 01/06/2016   Procedure: DILATATION & CURETTAGE/HYSTEROSCOPY WITH Jacklynn Barnacle;  Surgeon: Nunzio Cobbs, MD;  Location: [facility];  Service: Gynecology;  Laterality: N/A;   DILATION AND CURETTAGE OF UTERUS  11/08/1988   MAB?   Lumpectomies     X 6   PORTACATH PLACEMENT Left 12/15/2021   Procedure: INSERTION PORT-A-CATH;  Surgeon: Coralie Keens, MD;  Location: [facility];  Service: General;  Laterality: Left;   Total Breast reduction  06/2022     Current Outpatient Medications  Medication Sig Dispense Refill   acetaminophen (TYLENOL) 500 MG tablet Take 500 mg by mouth every 6 (six) hours as needed.     Ascorbic Acid (VITAMIN C) 100 MG tablet Take 100 mg by mouth daily.     tamoxifen (NOLVADEX) 20 MG tablet Take 1 tablet (20 mg total) by mouth daily. 90 tablet 3   thiamine (VITAMIN B-1) 50 MG tablet Take 50 mg by mouth daily.     clindamycin (CLINDAGEL) 1 % gel Apply topically 2 (two) times daily. As needed     diltiazem (CARDIZEM) 30 MG tablet Take 1 tablet (30 mg total) by mouth every 6 (six) hours as needed (as needed for palpitations). (Patient not taking: Reported on 12/14/2022) 120 tablet 3   lidocaine-prilocaine (EMLA) cream Apply 1 Application topically as needed. 30 g 1   Current Facility-Administered Medications  Medication Dose Route Frequency Provider Last Rate Last Admin   0.9 %  sodium chloride infusion  500 mL Intravenous Once Thornton Park, MD        Allergies as of 12/14/2022 - Review Complete 12/14/2022  Allergen Reaction Noted   Iodinated contrast media Other (See Comments) 04/28/2022   Latex Other (See Comments) 04/28/2022   Codeine Nausea And Vomiting 03/10/2012    Family History  Problem Relation Age of Onset   Pulmonary disease Mother    Heart disease Mother    Cervical cancer Mother 38   Breast cancer Maternal Aunt  dx late 48s   Lung cancer Maternal Uncle        d. 61   Bone cancer Paternal Grandmother        primary? limited info; d. 73s   Breast cancer Cousin 71       maternal female cousin   Ovarian cancer Cousin        maternal cousin; d. 53   Stomach cancer Other    Rectal cancer Other    Esophageal cancer Other    Colon polyps Other    Colon cancer Other    Cancer Other        cervical or other GYN cancer in several maternal relatives     Physical Exam: General:   Alert,  well-nourished, pleasant and cooperative in NAD Head:  Normocephalic and atraumatic. Eyes:   Sclera clear, no icterus.   Conjunctiva pink. Mouth:  No deformity or lesions.   Neck:  Supple; no masses or thyromegaly. Lungs:  Clear throughout to auscultation.   No wheezes. Heart:  Regular rate and rhythm; no murmurs. Abdomen:  Soft, non-tender, nondistended, normal bowel sounds, no rebound or guarding.  Msk:  Symmetrical. No boney deformities LAD: No inguinal or umbilical LAD Extremities:  No clubbing or edema. Neurologic:  Alert and  oriented x4;  grossly nonfocal Skin:  No obvious rash or bruise. Psych:  Alert and cooperative. Normal mood and affect.     Studies/Results: No results found.    Kylor Valverde L. Tarri Glenn, MD, MPH 12/14/2022, 9:31 AM

## 2022-12-14 NOTE — Progress Notes (Signed)
Pt's states no medical or surgical changes since previsit or office visit.  Patient has completed chemo and is now taking Tamoxifin

## 2022-12-14 NOTE — Progress Notes (Signed)
A and O x3. Report to RN. Tolerated MAC anesthesia well. 

## 2022-12-14 NOTE — Op Note (Signed)
[facility] Patient Name: Julie Atkinson Procedure Date: 12/14/2022 9:38 AM MRN: [id number] Endoscopist: Thornton Park MD, MD, [id number] Age: 52 Referring MD:  Date of Birth: 1971/10/20 Gender: Female Account #: [id number] Procedure:                Colonoscopy Indications:              Screening for colorectal malignant neoplasm, This                            is the patient's first colonoscopy Medicines:                Monitored Anesthesia Care Procedure:                Pre-Anesthesia Assessment:                           - Prior to the procedure, a History and Physical                            was performed, and patient medications and                            allergies were reviewed. The patient's tolerance of                            previous anesthesia was also reviewed. The risks                            and benefits of the procedure and the sedation                            options and risks were discussed with the patient.                            All questions were answered, and informed consent                            was obtained. Prior Anticoagulants: The patient has                            taken no anticoagulant or antiplatelet agents. ASA                            Grade Assessment: II - A patient with mild systemic                            disease. After reviewing the risks and benefits,                            the patient was deemed in satisfactory condition to                            undergo the procedure.  After obtaining informed consent, the colonoscope                            was passed under direct vision. Throughout the                            procedure, the patient's blood pressure, pulse, and                            oxygen saturations were monitored continuously. The                            Olympus CF-HQ190L ([id number]) Colonoscope was                            introduced through the  anus and advanced to the 3                            cm into the ileum. A second forward view of the                            right colon was performed. The colonoscopy was                            performed without difficulty. The patient tolerated                            the procedure well. The quality of the bowel                            preparation was good. The terminal ileum, ileocecal                            valve, appendiceal orifice, and rectum were                            photographed. Scope In: 9:48:36 AM Scope Out: 9:59:54 AM Scope Withdrawal Time: 0 hours 9 minutes 8 seconds  Total Procedure Duration: 0 hours 11 minutes 18 seconds  Findings:                 The perianal and digital rectal examinations were                            normal.                           Multiple medium-mouthed and small-mouthed                            diverticula were found in the sigmoid colon and                            descending colon.  A diffuse area of mildly erythematous mucosa was                            found in the distal sigmoid colon in the area of                            the most dense diverticulosis. Biopsies were taken                            with a cold forceps for histology. Estimated blood                            loss was minimal.                           The exam was otherwise without abnormality on                            direct and retroflexion views. Complications:            No immediate complications. Estimated Blood Loss:     Estimated blood loss was minimal. Impression:               - Diverticulosis in the sigmoid colon and in the                            descending colon.                           - Erythematous mucosa in the distal sigmoid colon                            in the area of the most pronounced diverticulosis.                            Biopsied to evaluate for prep artifact and/or                             segmental colitis associated with diverticulosis.                           - The examination was otherwise normal on direct                            and retroflexion views. Recommendation:           - Patient has a contact number available for                            emergencies. The signs and symptoms of potential                            delayed complications were discussed with the  patient. Return to normal activities tomorrow.                            Written discharge instructions were provided to the                            patient.                           - Continue present medications.                           - Await pathology results.                           - Repeat colonoscopy in 10 years for surveillance,                            earlier with new symptoms.                           - Follow a high fiber diet. Drink at least 64                            ounces of water daily. Add a daily stool bulking                            agent such as psyllium (an exampled would be                            Metamucil).                           - Emerging evidence supports eating a diet of                            fruits, vegetables, grains, calcium, and yogurt                            while reducing red meat and alcohol may reduce the                            risk of colon cancer.                           - Thank you for allowing me to be involved in your                            colon cancer prevention. Thornton Park MD, MD 12/14/2022 10:05:32 AM This report has been signed electronically.

## 2022-12-14 NOTE — Patient Instructions (Addendum)
Discharge instructions given. Handout on Diverticulosis. Biopsies taken. YOU HAD AN ENDOSCOPIC PROCEDURE TODAY AT [facility]:   Refer to the procedure report that was given to you for any specific questions about what was found during the examination.  If the procedure report does not answer your questions, please call your gastroenterologist to clarify.  If you requested that your care partner not be given the details of your procedure findings, then the procedure report has been included in a sealed envelope for you to review at your convenience later.  YOU SHOULD EXPECT: Some feelings of bloating in the abdomen. Passage of more gas than usual.  Walking can help get rid of the air that was put into your GI tract during the procedure and reduce the bloating. If you had a lower endoscopy (such as a colonoscopy or flexible sigmoidoscopy) you may notice spotting of blood in your stool or on the toilet paper. If you underwent a bowel prep for your procedure, you may not have a normal bowel movement for a few days.  Please Note:  You might notice some irritation and congestion in your nose or some drainage.  This is from the oxygen used during your procedure.  There is no need for concern and it should clear up in a day or so.  SYMPTOMS TO REPORT IMMEDIATELY:  Following lower endoscopy (colonoscopy or flexible sigmoidoscopy):  Excessive amounts of blood in the stool  Significant tenderness or worsening of abdominal pains  Swelling of the abdomen that is new, acute  Fever of 100F or higher   For urgent or emergent issues, a gastroenterologist can be reached at any hour by calling [phone]. Do not use MyChart messaging for urgent concerns.    DIET:  We do recommend a small meal at first, but then you may proceed to your regular diet.  Drink plenty of fluids but you should avoid alcoholic beverages for 24 hours.  ACTIVITY:  You should plan to take it easy for the rest of  today and you should NOT DRIVE or use heavy machinery until tomorrow (because of the sedation medicines used during the test).    FOLLOW UP: Our staff will call the number listed on your records the next business day following your procedure.  We will call around 7:15- 8:00 am to check on you and address any questions or concerns that you may have regarding the information given to you following your procedure. If we do not reach you, we will leave a message.     If any biopsies were taken you will be contacted by phone or by letter within the next 1-3 weeks.  Please call us at [phone] if you have not heard about the biopsies in 3 weeks.    SIGNATURES/CONFIDENTIALITY: You and/or your care partner have signed paperwork which will be entered into your electronic medical record.  These signatures attest to the fact that that the information above on your After Visit Summary has been reviewed and is understood.  Full responsibility of the confidentiality of this discharge information lies with you and/or your care-partner.

## 2022-12-15 ENCOUNTER — Telehealth: Payer: Self-pay

## 2022-12-15 NOTE — Telephone Encounter (Signed)
Left message on follow up call. 

## 2022-12-16 ENCOUNTER — Ambulatory Visit: Payer: BC Managed Care – PPO | Admitting: Internal Medicine

## 2022-12-20 ENCOUNTER — Encounter (HOSPITAL_BASED_OUTPATIENT_CLINIC_OR_DEPARTMENT_OTHER): Payer: BC Managed Care – PPO | Admitting: Obstetrics & Gynecology

## 2022-12-22 ENCOUNTER — Ambulatory Visit: Payer: BC Managed Care – PPO | Admitting: Internal Medicine

## 2022-12-23 ENCOUNTER — Encounter (HOSPITAL_BASED_OUTPATIENT_CLINIC_OR_DEPARTMENT_OTHER): Payer: Self-pay | Admitting: Surgery

## 2022-12-23 ENCOUNTER — Encounter (HOSPITAL_BASED_OUTPATIENT_CLINIC_OR_DEPARTMENT_OTHER): Payer: Self-pay | Admitting: Obstetrics & Gynecology

## 2022-12-27 ENCOUNTER — Encounter: Payer: Self-pay | Admitting: *Deleted

## 2022-12-27 ENCOUNTER — Encounter: Payer: Self-pay | Admitting: Rehabilitation

## 2022-12-27 ENCOUNTER — Ambulatory Visit: Payer: BC Managed Care – PPO | Attending: Radiation Oncology | Admitting: Rehabilitation

## 2022-12-27 DIAGNOSIS — C50411 Malignant neoplasm of upper-outer quadrant of right female breast: Secondary | ICD-10-CM | POA: Insufficient documentation

## 2022-12-27 DIAGNOSIS — R601 Generalized edema: Secondary | ICD-10-CM | POA: Diagnosis not present

## 2022-12-27 DIAGNOSIS — Z170 Estrogen receptor positive status [ER+]: Secondary | ICD-10-CM | POA: Diagnosis not present

## 2022-12-27 DIAGNOSIS — R293 Abnormal posture: Secondary | ICD-10-CM | POA: Diagnosis not present

## 2022-12-27 DIAGNOSIS — M79601 Pain in right arm: Secondary | ICD-10-CM | POA: Diagnosis not present

## 2022-12-27 DIAGNOSIS — M6281 Muscle weakness (generalized): Secondary | ICD-10-CM | POA: Diagnosis not present

## 2022-12-27 NOTE — Therapy (Signed)
[facility] PH[facility]ERAPY BREAST CANCER TREATMENT   Patient Name: Julie Atkinson MRN: [id number] DOB:06/22/1971, 52 y.o., female Today's Date: 12/27/2022   PT End of Session - 12/27/22 1000     Visit Number 10    Number of Visits 13    Date for PT Re-Evaluation 12/06/22    PT Start Time 02-10-02    PT Stop Time 1056    PT Time Calculation (min) 53 min    Activity Tolerance Patient tolerated treatment well    Behavior During Therapy [facility] for tasks assessed/performed                   Past Medical History:  Diagnosis Date   Cancer ([place])    Family history of breast cancer 12/04/2021   Family history of ovarian cancer 12/04/2021   Fibroid 02-10-2014   8 mm   History of posttraumatic stress disorder (PTSD) Feb 11, 2008   due to maternal death-MI   History of radiation therapy    Right breast- 07/15/22-08/23/22- Dr. Gery Pray   Hyperlipidemia    PTSD (post-traumatic stress disorder)    mother's death 02-11-2008; seizure and AMI while driving, patient performed CPR x 45 minutes   Seropositive for herpes simplex 2 infection 02/11/2016   Past Surgical History:  Procedure Laterality Date   BLADDER SURGERY  11/08/1976   nocturnal enuresis treatment   Breast biopsies  10-Feb-2022   BREAST LUMPECTOMY WITH RADIOACTIVE SEED AND AXILLARY LYMPH NODE DISSECTION  05/03/2022   CESAREAN SECTION  11/08/2005   Dr Quincy Simmonds   DILATATION & CURETTAGE/HYSTEROSCOPY WITH MYOSURE N/A 01/06/2016   Procedure: DILATATION & CURETTAGE/HYSTEROSCOPY WITH Jacklynn Barnacle;  Surgeon: Nunzio Cobbs, MD;  Location: [facility];  Service: Gynecology;  Laterality: N/A;   DILATION AND CURETTAGE OF UTERUS  11/08/1988   MAB?   Lumpectomies     X 6   PORTACATH PLACEMENT Left 12/15/2021   Procedure: INSERTION PORT-A-CATH;  Surgeon: Coralie Keens, MD;  Location: [place];  Service: General;  Laterality: Left;   Total Breast reduction  06/2022   Patient Active Problem List   Diagnosis Date Noted   Port-A-Cath in place  12/16/2021   Family history of breast cancer 12/04/2021   Family history of ovarian cancer 12/04/2021   Genetic testing 12/04/2021   Malignant neoplasm of upper-outer quadrant of right female breast ([place]) 12/01/2021   Breast lump on right side at 10 o'clock position 10/21/2021   PTSD (post-traumatic stress disorder)     PCP: [name], NP  REFERRING PROVIDER: Coralie Keens, MD  REFERRING DIAG: Right breast Cancer   THERAPY DIAG:  Muscle weakness (generalized)  Abnormal posture  Pain in right arm  Malignant neoplasm of upper-outer quadrant of right breast in female, estrogen receptor positive ([place])  Generalized edema  Rationale for Evaluation and Treatment Rehabilitation  ONSET DATE: 11/17/2021  SUBJECTIVE:  SUBJECTIVE STATEMENT: I saw both surgeons and they said it is going well.  They said I do have a lot of scar tissue.  I get my port out on Thursday.  My armpit feels less sore.    PERTINENT HISTORY:  Patient was diagnosed on 11/17/2021 with right grade grade II invasive ductal and lobular carcinoma breast cancer.. It measures 2.4 cm and is located in the upper outer quadrant. It is triple positive with a Ki67 of 15%. Radiation completed end of October.     05/24/2022- bilateral breast reduction   PATIENT GOALS:  Reassess how my recovery is going related to arm function, pain, and swelling.  PAIN:  Evaluation: Are you having pain? Yes: NPRS scale: 5/10 Pain location: Rt breast on the top outside   Pain description: burning  Aggravating factors: nothing  Relieving factors: PT visits  PRECAUTIONS: Recent Surgery, right UE Lymphedema risk, None  ACTIVITY LEVEL / LEISURE: She goes to the gym and lifts weights and rides a stationary bike for 7 miles (30 min) several times per  week   OBJECTIVE:   PATIENT SURVEYS:  Breast Complaints: Pain in operated breast- 8 - 7 A feeling of heaviness in the operate breast-9 - 7 A swollen breast at the operated side-9 - 7 The skin feels tensed at the operated breast-10 - 10 Redness of the skin at the operated breast-6 - 7 A print of my bra is visible at the operated breast-0 - 0 The pores of the skin at the operate breast are enlarged-4 - 2 The operated breast feels hard at some place- 10 - 10 56/80 on 10/25/22 -  50/80 on 12/06/22  OBSERVATIONS: Right breast has noticeable swelling with enlarged pores and nipple. The scar is healing as expected. Left breast had some hardening in the lower midline quadrant.   POSTURE:   Forward head and rounded shoulders  LYMPHEDEMA ASSESSMENT:     UPPER EXTREMITY AROM/PROM:   A/PROM Right 12/02/2021 Left 12/02/2021 Right 09/01/2022 Left  09/01/2022  Shoulder extension 47 49 60 60  Shoulder flexion 150 146 180 179  Shoulder abduction 157 160 171 167  Shoulder internal rotation 60 60 65 72  Shoulder external rotation 84 85 90 90                          (Blank rows = not tested)    CERVICAL AROM: All within normal limits   UPPER EXTREMITY STRENGTH: WNL    LYMPHEDEMA ASSESSMENTS:    LANDMARK RIGHT 12/02/2021 LEFT 12/02/2021 Right  09/01/2022 Left  09/01/2022  10 cm proximal to olecranon process 31.1 32.8 30 32  Olecranon process 26.3 26.7 25.8 25.8  10 cm proximal to ulnar styloid process 23.6 22.2 21.2 19.9  Just proximal to ulnar styloid process 16.4 16.3 15.6 16.2  Across hand at thumb web space 19.9 19.3 19.4 19  At base of 2nd digit 6.5 6.3 6 6  $ (Blank rows = not tested)   Surgery type/Date: Right Lumpectomy and Left benign Lumpectomy:05/05/2022 Number of lymph nodes removed: 2/2  Current/past treatment (chemo, radiation, hormone therapy): radiation, chemo  Other symptoms:  Heaviness/tightness Yes Pain Yes Pitting edema No Infections No  PATIENT  EDUCATION:  Education details: Right breast MLD, Exercise after surgery, Lymphedema  Person educated: Patient Education method: Explanation, Demonstration, and Handouts Education comprehension: verbalized understanding and returned demonstration  HOME EXERCISE PROGRAM: Self MLD for right breast   TODAY'S TREATMENT: Pt permission and consent  throughout each step of examination and treatment with modification and draping if requested when working on sensitive areas  12/27/22 Manual: In supine: Short neck, 5 diaphragmatic breaths, bil axillary nodes and establishment of interaxillary pathway, R inguinal nodes and establishment of axilloinguinal pathway, then R breast moving fluid towards pathways spending extra time in any areas of fibrosis then retracing all steps.  STM with arm propped overhead to the pectoralis PROM into flexion and D2   12/06/22 Redid goals and Breast edema questionnaire.   Manual: In supine: Short neck, 5 diaphragmatic breaths, L axillary nodes and establishment of interaxillary pathway, R inguinal nodes and establishment of axilloinguinal pathway, then R breast moving fluid towards pathways spending extra time in any areas of fibrosis then retracing all steps.   11/22/22 Redid SOZO and rescheduled next SOZO TE: Pulleys x 67mn abduction Sidelying open book 3x15" bil Mermaid stretch 2x20" left only  Discussed getting up from floor strengthening and gave pt green band: sit to stand, wall slides, bridge, side steps and monster walks with green band with performance of each x 3 Manual: In supine: Short neck, 5 diaphragmatic breaths, L axillary nodes and establishment of interaxillary pathway, R inguinal nodes and establishment of axilloinguinal pathway, then R breast moving fluid towards pathways spending extra time in any areas of fibrosis then retracing all steps. Working in side lying along the posterior interaxillary pathways.   ASSESSMENT: CLINICAL  IMPRESSION: Breast is less red at the incisions and softer today compared to last visits.  Continued MLD and added some STM/MFR to the pectoralis where pt still feels tight.   Pt will benefit from skilled therapeutic intervention to improve on the following deficits: Decreased knowledge of precautions, impaired UE functional use, pain, decreased ROM, postural dysfunction.   PT treatment/interventions: ADL/Self care home management, Therapeutic exercises, Therapeutic activity, Neuromuscular re-education, Balance training, Gait training, Patient/Family education, Self Care, Joint mobilization, Dry Needling, Cryotherapy, Moist heat, Manual lymph drainage, scar mobilization, Taping, and Manual therapy   GOALS: Goals reviewed with patient? Yes  LONG TERM GOALS:  (STG=LTG)  GOALS Name Target Date  Goal status  1 Pt will demonstrate she has regained full shoulder ROM and function post operatively compared to baselines.  Baseline: 09/01/2022 MET  2  Pt will report a dash score of 4.55% to an increase in functional ability and reduction of pain. 11/24/2021 MET  3 Pt will report 75% reduction of pain due to improvements in posture, strength, and muscle length  11/24/2021 MET in regards to the shoulders  4 Patient will be Independent with advance HEP 11/24/2021 ONGOING  5 Pt will be ind with self MLD and compression for the left breast 01/31/23 NEW  6 Pt will decrease breast edema questionnaire to 40/50 to demonstrate decreased symptoms of lymphedema 01/31/23 NEW     PLAN:  PT FREQUENCY/DURATION: 1-2x per week x 6 weeks   PLAN FOR NEXT SESSION:review self MLD, scar work,  breast MLD, gentle stretching   [facility]  [address]  [place] [address] [phone] After Breast Cancer Class It is recommended you attend the ABC class to be educated on lymphedema risk reduction. This class is free of charge and lasts for 1 hour. It is a 1-time class. You will need  to download the Webex app either on your phone or computer. We will send you a link the night before or the morning of the class. You should be able to click on that  link to join the class. This is not a confidential class. You don't have to turn your camera on, but other participants may be able to see your email address.  Scar massage You can begin gentle scar massage to you incision sites. Gently place one hand on the incision and move the skin (without sliding on the skin) in various directions. Do this for a few minutes and then you can gently massage either coconut oil or vitamin E cream into the scars.  Compression garment You should continue wearing your compression bra until you feel like you no longer have swelling.  Home exercise Program Continue doing the exercises you were given until you feel like you can do them without feeling any tightness at the end.   Walking Program Studies show that 30 minutes of walking per day (fast enough to elevate your heart rate) can significantly reduce the risk of a cancer recurrence. If you can't walk due to other medical reasons, we encourage you to find another activity you could do (like a stationary bike or water exercise).  Posture After breast cancer surgery, people frequently sit with rounded shoulders posture because it puts their incisions on slack and feels better. If you sit like this and scar tissue forms in that position, you can become very tight and have pain sitting or standing with good posture. Try to be aware of your posture and sit and stand up tall to heal properly.  Follow up PT: It is recommended you return every 3 months for the first 3 years following surgery to be assessed on the SOZO machine for an L-Dex score. This helps prevent clinically significant lymphedema in 95% of patients. These follow up screens are 10 minute appointments that you are not billed for.   Shan Levans, PT  12/27/2022  10:57 AM

## 2022-12-28 ENCOUNTER — Other Ambulatory Visit: Payer: Self-pay | Admitting: Surgery

## 2022-12-28 ENCOUNTER — Telehealth: Payer: Self-pay

## 2022-12-28 NOTE — Telephone Encounter (Signed)
[facility] and spoke with Tomi Bamberger. I was informed that she is going to pull the slides for Dr. Arby Barrette to re-review. If additional information is added an addendum will be created. Will follow up later this week.

## 2022-12-28 NOTE — Telephone Encounter (Signed)
-----   Message from Thornton Park, MD sent at 12/16/2022 11:38 AM EST ----- Please ask the pathologist to confirm these results. Biopsies were obtained to exclude segmental colitis associated with diverticulosis.  Thanks.  KLB ----- Message ----- From: Interface, Lab In [facility] Sent: 12/16/2022  11:05 AM EST To: Thornton Park, MD

## 2022-12-29 DIAGNOSIS — L309 Dermatitis, unspecified: Secondary | ICD-10-CM | POA: Diagnosis not present

## 2022-12-29 DIAGNOSIS — F411 Generalized anxiety disorder: Secondary | ICD-10-CM | POA: Diagnosis not present

## 2022-12-29 DIAGNOSIS — F410 Panic disorder [episodic paroxysmal anxiety] without agoraphobia: Secondary | ICD-10-CM | POA: Diagnosis not present

## 2022-12-29 DIAGNOSIS — Z7689 Persons encountering health services in other specified circumstances: Secondary | ICD-10-CM | POA: Diagnosis not present

## 2022-12-29 DIAGNOSIS — C50911 Malignant neoplasm of unspecified site of right female breast: Secondary | ICD-10-CM | POA: Diagnosis not present

## 2022-12-29 DIAGNOSIS — Z6828 Body mass index (BMI) 28.0-28.9, adult: Secondary | ICD-10-CM | POA: Diagnosis not present

## 2022-12-29 NOTE — H&P (Signed)
Julie Atkinson is an 52 y.o. female.   Chief Complaint: history of breast cancer, port-a-cath no longer needed HPI: this is a 52 yo female who has completed chemotherapy and no longer needs a port.  She is doing well and has no complaints  Past Medical History:  Diagnosis Date   Cancer [facility])    Family history of breast cancer 12/04/2021   Family history of ovarian cancer 12/04/2021   Fibroid 01/23/2014   8 mm   History of posttraumatic stress disorder (PTSD) 01/24/2008   due to maternal death-MI   History of radiation therapy    Right breast- 07/15/22-08/23/22- Dr. Gery Pray   Hyperlipidemia    PTSD (post-traumatic stress disorder)    mother's death 24-Jan-2008; seizure and AMI while driving, patient performed CPR x 45 minutes   Seropositive for herpes simplex 2 infection 2016-01-24    Past Surgical History:  Procedure Laterality Date   BLADDER SURGERY  11/08/1976   nocturnal enuresis treatment   Breast biopsies  01-23-22   BREAST LUMPECTOMY WITH RADIOACTIVE SEED AND AXILLARY LYMPH NODE DISSECTION  05/03/2022   CESAREAN SECTION  11/08/2005   Dr Quincy Simmonds   DILATATION & CURETTAGE/HYSTEROSCOPY WITH MYOSURE N/A 01/06/2016   Procedure: DILATATION & CURETTAGE/HYSTEROSCOPY WITH Jacklynn Barnacle;  Surgeon: Nunzio Cobbs, MD;  Location: [facility];  Service: Gynecology;  Laterality: N/A;   DILATION AND CURETTAGE OF UTERUS  11/08/1988   MAB?   Lumpectomies     X 6   PORTACATH PLACEMENT Left 12/15/2021   Procedure: INSERTION PORT-A-CATH;  Surgeon: Coralie Keens, MD;  Location: [place];  Service: General;  Laterality: Left;   Total Breast reduction  06/2022    Family History  Problem Relation Age of Onset   Pulmonary disease Mother    Heart disease Mother    Cervical cancer Mother 9   Breast cancer Maternal Aunt        dx late 42s   Lung cancer Maternal Uncle        d. 11   Bone cancer Paternal Grandmother        primary? limited info; d. 71s   Breast cancer Cousin 38       maternal  female cousin   Ovarian cancer Cousin        maternal cousin; d. 64   Stomach cancer Other    Rectal cancer Other    Esophageal cancer Other    Colon polyps Other    Colon cancer Other    Cancer Other        cervical or other GYN cancer in several maternal relatives   Social History:  reports that she quit smoking about 8 years ago. Her smoking use included cigarettes. She has quit using smokeless tobacco. She reports current alcohol use of about 4.0 standard drinks of alcohol per week. She reports that she does not use drugs.  Allergies:  Allergies  Allergen Reactions   Iodinated Contrast Media Other (See Comments)    Mother had anaphylactic shock   Latex Other (See Comments)    Could be adhesive Rash on Port site Needs sensitive skin dressing   Codeine Nausea And Vomiting    No medications prior to admission.    No results found for this or any previous visit (from the past 48 hour(s)). No results found.  Review of Systems  All other systems reviewed and are negative.   Height 5' 8"$  (1.727 m), weight 81.2 kg, last menstrual period 01/06/2022. Physical  Exam Constitutional:      Appearance: Normal appearance.  Cardiovascular:     Rate and Rhythm: Normal rate and regular rhythm.  Pulmonary:     Effort: Pulmonary effort is normal. No respiratory distress.  Skin:    General: Skin is warm and dry.  Neurological:     General: No focal deficit present.     Mental Status: She is alert.  Psychiatric:        Behavior: Behavior normal.        Thought Content: Thought content normal.      Assessment/Plan Port a cath no longer needed with treatment complete for breast cancer  Plan port a cath removal.  The risks where discussed with the patient  Coralie Keens, MD 12/29/2022, 5:03 PM

## 2022-12-30 ENCOUNTER — Encounter (HOSPITAL_BASED_OUTPATIENT_CLINIC_OR_DEPARTMENT_OTHER): Payer: Self-pay | Admitting: Surgery

## 2022-12-30 ENCOUNTER — Other Ambulatory Visit: Payer: Self-pay

## 2022-12-30 ENCOUNTER — Encounter (HOSPITAL_BASED_OUTPATIENT_CLINIC_OR_DEPARTMENT_OTHER)
Admission: RE | Disposition: A | Discharge status: Home / Self Care | Payer: Self-pay | Source: Home / Self Care | Attending: Surgery

## 2022-12-30 ENCOUNTER — Ambulatory Visit (HOSPITAL_BASED_OUTPATIENT_CLINIC_OR_DEPARTMENT_OTHER)
Admission: RE | Admit: 2022-12-30 | Discharge: 2022-12-30 | Disposition: A | Discharge status: Home / Self Care | Payer: BC Managed Care – PPO | Attending: Surgery | Admitting: Surgery

## 2022-12-30 ENCOUNTER — Ambulatory Visit (HOSPITAL_BASED_OUTPATIENT_CLINIC_OR_DEPARTMENT_OTHER): Payer: BC Managed Care – PPO | Admitting: Anesthesiology

## 2022-12-30 DIAGNOSIS — F419 Anxiety disorder, unspecified: Secondary | ICD-10-CM | POA: Insufficient documentation

## 2022-12-30 DIAGNOSIS — Z87891 Personal history of nicotine dependence: Secondary | ICD-10-CM | POA: Diagnosis not present

## 2022-12-30 DIAGNOSIS — Z9221 Personal history of antineoplastic chemotherapy: Secondary | ICD-10-CM | POA: Diagnosis not present

## 2022-12-30 DIAGNOSIS — Z803 Family history of malignant neoplasm of breast: Secondary | ICD-10-CM | POA: Diagnosis not present

## 2022-12-30 DIAGNOSIS — Z01818 Encounter for other preprocedural examination: Secondary | ICD-10-CM

## 2022-12-30 DIAGNOSIS — E785 Hyperlipidemia, unspecified: Secondary | ICD-10-CM | POA: Insufficient documentation

## 2022-12-30 DIAGNOSIS — Z452 Encounter for adjustment and management of vascular access device: Secondary | ICD-10-CM | POA: Insufficient documentation

## 2022-12-30 DIAGNOSIS — Z853 Personal history of malignant neoplasm of breast: Secondary | ICD-10-CM | POA: Insufficient documentation

## 2022-12-30 HISTORY — PX: PORT-A-CATH REMOVAL: SHX5289

## 2022-12-30 LAB — POCT PREGNANCY, URINE: Preg Test, Ur: NEGATIVE

## 2022-12-30 SURGERY — REMOVAL PORT-A-CATH
Anesthesia: Monitor Anesthesia Care | Site: Chest

## 2022-12-30 MED ORDER — MIDAZOLAM HCL 5 MG/5ML IJ SOLN
INTRAMUSCULAR | Status: DC | PRN
  Administered 2022-12-30: 2 mg via INTRAVENOUS

## 2022-12-30 MED ORDER — LACTATED RINGERS IV SOLN: INTRAVENOUS | Status: DC

## 2022-12-30 MED ORDER — LIDOCAINE HCL (PF) 1 % IJ SOLN
INTRAMUSCULAR | Status: DC | PRN
  Administered 2022-12-30: 3.5 mL

## 2022-12-30 MED ORDER — ACETAMINOPHEN 500 MG PO TABS
1000.0000 mg | ORAL_TABLET | Freq: Once | ORAL | Status: AC
  Administered 2022-12-30: 1000 mg via ORAL

## 2022-12-30 MED ORDER — CEFAZOLIN SODIUM-DEXTROSE 2-4 GM/100ML-% IV SOLN
2.0000 g | INTRAVENOUS | Status: AC
  Administered 2022-12-30: 2 g via INTRAVENOUS

## 2022-12-30 MED ORDER — FENTANYL CITRATE (PF) 100 MCG/2ML IJ SOLN
INTRAMUSCULAR | Status: DC | PRN
  Administered 2022-12-30: 50 ug via INTRAVENOUS

## 2022-12-30 MED ORDER — PROPOFOL 500 MG/50ML IV EMUL
INTRAVENOUS | Status: DC | PRN
  Administered 2022-12-30: 200 ug/kg/min via INTRAVENOUS

## 2022-12-30 MED ORDER — ACETAMINOPHEN 500 MG PO TABS
ORAL_TABLET | ORAL | Status: AC
  Filled 2022-12-30: qty 2

## 2022-12-30 MED ORDER — ACETAMINOPHEN 500 MG PO TABS: 1000.0000 mg | ORAL_TABLET | ORAL | Status: AC

## 2022-12-30 MED ORDER — CHLORHEXIDINE GLUCONATE CLOTH 2 % EX PADS: 6.0000 | MEDICATED_PAD | Freq: Once | CUTANEOUS | Status: DC

## 2022-12-30 MED ORDER — MIDAZOLAM HCL 2 MG/2ML IJ SOLN
INTRAMUSCULAR | Status: AC
  Filled 2022-12-30: qty 2

## 2022-12-30 MED ORDER — ONDANSETRON HCL 4 MG/2ML IJ SOLN
INTRAMUSCULAR | Status: AC
  Filled 2022-12-30: qty 2

## 2022-12-30 MED ORDER — FENTANYL CITRATE (PF) 100 MCG/2ML IJ SOLN: 25.0000 ug | INTRAMUSCULAR | Status: DC | PRN

## 2022-12-30 MED ORDER — ONDANSETRON HCL 4 MG/2ML IJ SOLN
INTRAMUSCULAR | Status: DC | PRN
  Administered 2022-12-30: 4 mg via INTRAVENOUS

## 2022-12-30 MED ORDER — FENTANYL CITRATE (PF) 100 MCG/2ML IJ SOLN
INTRAMUSCULAR | Status: AC
  Filled 2022-12-30: qty 2

## 2022-12-30 SURGICAL SUPPLY — 30 items
ADH SKN CLS APL DERMABOND .7 (GAUZE/BANDAGES/DRESSINGS) ×1
APL PRP STRL LF DISP 70% ISPRP (MISCELLANEOUS) ×1
BLADE SURG 15 STRL LF DISP TIS (BLADE) ×1 IMPLANT
BLADE SURG 15 STRL SS (BLADE) ×1
CHLORAPREP W/TINT 26 (MISCELLANEOUS) ×1 IMPLANT
COVER BACK TABLE 60X90IN (DRAPES) ×1 IMPLANT
COVER MAYO STAND STRL (DRAPES) ×1 IMPLANT
DERMABOND ADVANCED .7 DNX12 (GAUZE/BANDAGES/DRESSINGS) ×1 IMPLANT
DRAPE LAPAROTOMY 100X72 PEDS (DRAPES) ×1 IMPLANT
DRAPE UTILITY XL STRL (DRAPES) ×1 IMPLANT
ELECT REM PT RETURN 9FT ADLT (ELECTROSURGICAL) ×1
ELECTRODE REM PT RTRN 9FT ADLT (ELECTROSURGICAL) ×1 IMPLANT
GLOVE SURG SIGNA 7.5 PF LTX (GLOVE) ×1 IMPLANT
GOWN STRL REUS W/ TWL LRG LVL3 (GOWN DISPOSABLE) ×1 IMPLANT
GOWN STRL REUS W/ TWL XL LVL3 (GOWN DISPOSABLE) ×1 IMPLANT
GOWN STRL REUS W/TWL LRG LVL3 (GOWN DISPOSABLE) ×1
GOWN STRL REUS W/TWL XL LVL3 (GOWN DISPOSABLE) ×1
NDL HYPO 25X1 1.5 SAFETY (NEEDLE) ×1 IMPLANT
NEEDLE HYPO 25X1 1.5 SAFETY (NEEDLE) ×1 IMPLANT
NS IRRIG 1000ML POUR BTL (IV SOLUTION) IMPLANT
PACK BASIN DAY SURGERY FS (CUSTOM PROCEDURE TRAY) ×1 IMPLANT
PENCIL SMOKE EVACUATOR (MISCELLANEOUS) ×1 IMPLANT
SLEEVE SCD COMPRESS KNEE MED (STOCKING) IMPLANT
SPIKE FLUID TRANSFER (MISCELLANEOUS) IMPLANT
SUT MNCRL AB 4-0 PS2 18 (SUTURE) ×1 IMPLANT
SUT VIC AB 3-0 SH 27 (SUTURE) ×1
SUT VIC AB 3-0 SH 27X BRD (SUTURE) ×1 IMPLANT
SYR BULB EAR ULCER 3OZ GRN STR (SYRINGE) IMPLANT
SYR CONTROL 10ML LL (SYRINGE) ×1 IMPLANT
TOWEL GREEN STERILE FF (TOWEL DISPOSABLE) ×1 IMPLANT

## 2022-12-30 NOTE — Telephone Encounter (Signed)
Updated pathology report available.

## 2022-12-30 NOTE — Op Note (Signed)
REMOVAL PORT-A-CATH  Procedure Note  Julie Atkinson 12/30/2022   Pre-op Diagnosis: HISTORY OF BREAST CANCER, PORT-A-CATH NO LONGER NEEDED     Post-op Diagnosis: SAME  Procedure(s): REMOVAL PORT-A-CATH  Surgeon(s): Coralie Keens, MD  Anesthesia: Monitor Anesthesia Care  Staff:  Circulator: Maurene Capes, RN Scrub Person: Lorenza Burton, CST  Estimated Blood Loss: Minimal               Specimens: port sent to path  Indications: This is a 52 year old female has completed her treatment for breast cancer and the Port-A-Cath is no longer needed therefore decision has been made to proceed with port removal  Procedure: The patient was brought to the operating identified as correct patient.  She was placed upon the operating table and anesthesia was induced.  Her left chest was then prepped and draped in usual sterile fashion.  I anesthetized the skin at the scar at the Port-A-Cath site with lidocaine.  I made incision with a scalpel and then dissected down to the port with the cautery.  Identified the port and cut the sutures holding the port in place and then removed the port and his catheter in its entirety.  I then closed the catheter tract with a figure-of-eight 3-0 Vicryl suture.  I then closed the subtenons tissue with interrupted 3-0 Vicryl sutures and closed the skin with a running 4-0 Monocryl.  Dermabond was then applied.  The patient tolerated the procedure well.  All the counts were correct at the end of the procedure.  Patient was then taken in a stable condition from the operating room to the recovery room.          Coralie Keens   Date: 12/30/2022  Time: 12:40 PM

## 2022-12-30 NOTE — Discharge Instructions (Addendum)
You may shower starting tomorrow  Ice pack, tylenol, and ibuprofen also for pain.  Next dose of Tylenol after 4:30pm today, if needed.  No vigorous activity for one week   Post Anesthesia Home Care Instructions  Activity: Get plenty of rest for the remainder of the day. A responsible individual must stay with you for 24 hours following the procedure.  For the next 24 hours, DO NOT: -Drive a car -Paediatric nurse -Drink alcoholic beverages -Take any medication unless instructed by your physician -Make any legal decisions or sign important papers.  Meals: Start with liquid foods such as gelatin or soup. Progress to regular foods as tolerated. Avoid greasy, spicy, heavy foods. If nausea and/or vomiting occur, drink only clear liquids until the nausea and/or vomiting subsides. Call your physician if vomiting continues.  Special Instructions/Symptoms: Your throat may feel dry or sore from the anesthesia or the breathing tube placed in your throat during surgery. If this causes discomfort, gargle with warm salt water. The discomfort should disappear within 24 hours.  If you had a scopolamine patch placed behind your ear for the management of post- operative nausea and/or vomiting:  1. The medication in the patch is effective for 72 hours, after which it should be removed.  Wrap patch in a tissue and discard in the trash. Wash hands thoroughly with soap and water. 2. You may remove the patch earlier than 72 hours if you experience unpleasant side effects which may include dry mouth, dizziness or visual disturbances. 3. Avoid touching the patch. Wash your hands with soap and water after contact with the patch.

## 2022-12-30 NOTE — Transfer of Care (Signed)
Immediate Anesthesia Transfer of Care Note  Patient: Julie Atkinson  Procedure(s) Performed: REMOVAL PORT-A-CATH (Chest)  Patient Location: PACU  Anesthesia Type:MAC  Level of Consciousness: awake, alert , and oriented  Airway & Oxygen Therapy: Patient Spontanous Breathing and Patient connected to face mask oxygen  Post-op Assessment: Report given to RN and Post -op Vital signs reviewed and stable  Post vital signs: Reviewed and stable  Last Vitals:  Vitals Value Taken Time  BP 106/72 12/30/22 1244  Temp 36.9 C 12/30/22 1244  Pulse 63 12/30/22 1244  Resp 15 12/30/22 1244  SpO2 95 % 12/30/22 1244  Vitals shown include unvalidated device data.  Last Pain:  Vitals:   12/30/22 1023  TempSrc: Oral  PainSc: 0-No pain         Complications: No notable events documented.

## 2022-12-30 NOTE — Anesthesia Postprocedure Evaluation (Signed)
Anesthesia Post Note  Patient: AVIV SALZ  Procedure(s) Performed: REMOVAL PORT-A-CATH (Chest)     Patient location during evaluation: PACU Anesthesia Type: MAC Level of consciousness: awake and alert Pain management: pain level controlled Vital Signs Assessment: post-procedure vital signs reviewed and stable Respiratory status: spontaneous breathing, nonlabored ventilation, respiratory function stable and patient connected to nasal cannula oxygen Cardiovascular status: stable and blood pressure returned to baseline Postop Assessment: no apparent nausea or vomiting Anesthetic complications: no  No notable events documented.  Last Vitals:  Vitals:   12/30/22 1315 12/30/22 1336  BP: 130/74 130/77  Pulse: (!) 47 65  Resp: 14 16  Temp:  (!) 36.3 C  SpO2: 97% 97%    Last Pain:  Vitals:   12/30/22 1333  TempSrc:   PainSc: 0-No pain                 Ziana Heyliger L Neita Landrigan

## 2022-12-30 NOTE — Anesthesia Preprocedure Evaluation (Signed)
Anesthesia Evaluation  Patient identified by MRN, date of birth, ID band Patient awake    Reviewed: Allergy & Precautions, NPO status , Patient's Chart, lab work & pertinent test results  Airway Mallampati: II  TM Distance: >3 FB Neck ROM: Full    Dental no notable dental hx. (+) Teeth Intact, Dental Advisory Given   Pulmonary neg pulmonary ROS, former smoker   Pulmonary exam normal breath sounds clear to auscultation       Cardiovascular negative cardio ROS Normal cardiovascular exam Rhythm:Regular Rate:Normal  HLD   Neuro/Psych  PSYCHIATRIC DISORDERS Anxiety     negative neurological ROS     GI/Hepatic negative GI ROS, Neg liver ROS,,,  Endo/Other  negative endocrine ROS    Renal/GU negative Renal ROS  negative genitourinary   Musculoskeletal negative musculoskeletal ROS (+)    Abdominal   Peds  Hematology negative hematology ROS (+)   Anesthesia Other Findings   Reproductive/Obstetrics                             Anesthesia Physical Anesthesia Plan  ASA: 2  Anesthesia Plan: MAC   Post-op Pain Management: Tylenol PO (pre-op)*   Induction: Intravenous  PONV Risk Score and Plan: 2 and Propofol infusion, Treatment may vary due to age or medical condition, Dexamethasone, Ondansetron and Midazolam  Airway Management Planned: Natural Airway  Additional Equipment:   Intra-op Plan:   Post-operative Plan:   Informed Consent: I have reviewed the patients History and Physical, chart, labs and discussed the procedure including the risks, benefits and alternatives for the proposed anesthesia with the patient or authorized representative who has indicated his/her understanding and acceptance.     Dental advisory given  Plan Discussed with: CRNA  Anesthesia Plan Comments:        Anesthesia Quick Evaluation

## 2022-12-30 NOTE — Interval H&P Note (Signed)
History and Physical Interval Note:no change in H and P  12/30/2022 11:08 AM  Julie Atkinson  has presented today for surgery, with the diagnosis of HX OF BREAST CANCER.  The various methods of treatment have been discussed with the patient and family. After consideration of risks, benefits and other options for treatment, the patient has consented to  Procedure(s): REMOVAL PORT-A-CATH (N/A) as a surgical intervention.  The patient's history has been reviewed, patient examined, no change in status, stable for surgery.  I have reviewed the patient's chart and labs.  Questions were answered to the patient's satisfaction.     Coralie Keens

## 2022-12-31 ENCOUNTER — Other Ambulatory Visit (HOSPITAL_BASED_OUTPATIENT_CLINIC_OR_DEPARTMENT_OTHER): Payer: Self-pay | Admitting: Obstetrics & Gynecology

## 2022-12-31 ENCOUNTER — Encounter (HOSPITAL_BASED_OUTPATIENT_CLINIC_OR_DEPARTMENT_OTHER): Payer: Self-pay | Admitting: Surgery

## 2022-12-31 MED ORDER — FLUOXETINE HCL 10 MG PO CAPS: 10.0000 mg | ORAL_CAPSULE | Freq: Every day | ORAL | 1 refills | Status: AC

## 2023-01-03 ENCOUNTER — Encounter (HOSPITAL_BASED_OUTPATIENT_CLINIC_OR_DEPARTMENT_OTHER): Payer: Self-pay | Admitting: Obstetrics & Gynecology

## 2023-01-04 ENCOUNTER — Encounter: Payer: Self-pay | Admitting: Gastroenterology

## 2023-01-17 ENCOUNTER — Ambulatory Visit: Payer: BC Managed Care – PPO | Attending: Radiation Oncology | Admitting: Rehabilitation

## 2023-01-17 DIAGNOSIS — Z170 Estrogen receptor positive status [ER+]: Secondary | ICD-10-CM

## 2023-01-17 DIAGNOSIS — R601 Generalized edema: Secondary | ICD-10-CM | POA: Insufficient documentation

## 2023-01-17 DIAGNOSIS — C50411 Malignant neoplasm of upper-outer quadrant of right female breast: Secondary | ICD-10-CM | POA: Diagnosis not present

## 2023-01-17 DIAGNOSIS — R293 Abnormal posture: Secondary | ICD-10-CM | POA: Insufficient documentation

## 2023-01-17 DIAGNOSIS — M79601 Pain in right arm: Secondary | ICD-10-CM | POA: Diagnosis not present

## 2023-01-17 DIAGNOSIS — M6281 Muscle weakness (generalized): Secondary | ICD-10-CM | POA: Diagnosis not present

## 2023-01-17 NOTE — Therapy (Signed)
[facility] OUTPATIENT PHYSICAL THERAPY BREAST CANCER TREATMENT   Patient Name: Julie Atkinson MRN: [id number] DOB:1971-04-25, 52 y.o., female Today's Date: 01/17/2023   PT End of Session - 01/17/23 0952     Visit Number 11    Number of Visits 13    Date for PT Re-Evaluation 01/31/23    PT Start Time 1000    PT Stop Time 1052-02-01    PT Time Calculation (min) 53 min    Activity Tolerance Patient tolerated treatment well    Behavior During Therapy [facility] for tasks assessed/performed                   Past Medical History:  Diagnosis Date   Cancer ([place])    Family history of breast cancer 12/04/2021   Family history of ovarian cancer 12/04/2021   Fibroid 31-Jan-2014   8 mm   History of posttraumatic stress disorder (PTSD) 02/01/2008   due to maternal death-MI   History of radiation therapy    Right breast- 07/15/22-08/23/22- Dr. Gery Pray   Hyperlipidemia    PTSD (post-traumatic stress disorder)    mother's death 01-Feb-2008; seizure and AMI while driving, patient performed CPR x 45 minutes   Seropositive for herpes simplex 2 infection 2016-02-01   Past Surgical History:  Procedure Laterality Date   BLADDER SURGERY  11/08/1976   nocturnal enuresis treatment   Breast biopsies  2022-01-31   BREAST LUMPECTOMY WITH RADIOACTIVE SEED AND AXILLARY LYMPH NODE DISSECTION  05/03/2022   CESAREAN SECTION  11/08/2005   Dr Quincy Simmonds   DILATATION & CURETTAGE/HYSTEROSCOPY WITH MYOSURE N/A 01/06/2016   Procedure: DILATATION & CURETTAGE/HYSTEROSCOPY WITH Jacklynn Barnacle;  Surgeon: Nunzio Cobbs, MD;  Location: [facility];  Service: Gynecology;  Laterality: N/A;   DILATION AND CURETTAGE OF UTERUS  11/08/1988   MAB?   Lumpectomies     X 6   PORT-A-CATH REMOVAL N/A 12/30/2022   Procedure: REMOVAL PORT-A-CATH;  Surgeon: Coralie Keens, MD;  Location: [place];  Service: General;  Laterality: N/A;   PORTACATH PLACEMENT Left 12/15/2021   Procedure: INSERTION PORT-A-CATH;  Surgeon: Coralie Keens, MD;  Location:  [place];  Service: General;  Laterality: Left;   Total Breast reduction  06/2022   Patient Active Problem List   Diagnosis Date Noted   Port-A-Cath in place 12/16/2021   Family history of breast cancer 12/04/2021   Family history of ovarian cancer 12/04/2021   Genetic testing 12/04/2021   Malignant neoplasm of upper-outer quadrant of right female breast ([place]) 12/01/2021   Breast lump on right side at 10 o'clock position 10/21/2021   PTSD (post-traumatic stress disorder)     PCP: [name], NP  REFERRING PROVIDER: Coralie Keens, MD  REFERRING DIAG: Right breast Cancer   THERAPY DIAG:  Muscle weakness (generalized)  Abnormal posture  Pain in right arm  Malignant neoplasm of upper-outer quadrant of right breast in female, estrogen receptor positive ([place])  Generalized edema  Rationale for Evaluation and Treatment Rehabilitation  ONSET DATE: 11/17/2021  SUBJECTIVE:  SUBJECTIVE STATEMENT: I have been having terrible feet and ankle pain and still having swelling in my legs.  The breast and arm are feeling pretty good and it isn't bothering me much right now.  It doesn't feel 100% but better.    PERTINENT HISTORY:  Patient was diagnosed on 11/17/2021 with right grade grade II invasive ductal and lobular carcinoma breast cancer.. It measures 2.4 cm and is located in the upper outer quadrant. It is triple positive with a Ki67 of 15%. Radiation completed end of October.     05/24/2022- bilateral breast reduction   PATIENT GOALS:  Reassess how my recovery is going related to arm function, pain, and swelling.  PAIN:  Evaluation: Are you having pain? Yes: NPRS scale: 2/10 Pain location: Rt breast on the top outside   Pain description: sore Aggravating factors: nothing   Relieving factors: PT visits  PRECAUTIONS: Recent Surgery, right UE Lymphedema risk, None  ACTIVITY LEVEL / LEISURE: She goes to the gym and lifts weights and rides a stationary bike for 7 miles (30 min) several times per week   OBJECTIVE:   PATIENT SURVEYS:  Breast Complaints: Pain in operated breast- 8 - 7 A feeling of heaviness in the operate breast-9 - 7 A swollen breast at the operated side-9 - 7 The skin feels tensed at the operated breast-10 - 10 Redness of the skin at the operated breast-6 - 7 A print of my bra is visible at the operated breast-0 - 0 The pores of the skin at the operate breast are enlarged-4 - 2 The operated breast feels hard at some place- 10 - 10 56/80 on 10/25/22 -  50/80 on 12/06/22  OBSERVATIONS: Right breast has noticeable swelling with enlarged pores and nipple. The scar is healing as expected. Left breast had some hardening in the lower midline quadrant.   POSTURE:   Forward head and rounded shoulders  LYMPHEDEMA ASSESSMENT:     UPPER EXTREMITY AROM/PROM:   A/PROM Right 12/02/2021 Left 12/02/2021 Right 09/01/2022 Left  09/01/2022 Right 01/17/23  Shoulder extension 47 49 60 60   Shoulder flexion 150 146 180 179 175 - pull in armpit/lateral armpit  Shoulder abduction 157 160 171 167 170 - pull in armpit  Shoulder internal rotation 60 60 65 72   Shoulder external rotation 84 85 90 90 90                          (Blank rows = not tested)    CERVICAL AROM: All within normal limits   UPPER EXTREMITY STRENGTH: WNL    LYMPHEDEMA ASSESSMENTS:    LANDMARK RIGHT 12/02/2021 LEFT 12/02/2021 Right  09/01/2022 Left  09/01/2022  10 cm proximal to olecranon process 31.1 32.8 30 32  Olecranon process 26.3 26.7 25.8 25.8  10 cm proximal to ulnar styloid process 23.6 22.2 21.2 19.9  Just proximal to ulnar styloid process 16.4 16.3 15.6 16.2  Across hand at thumb web space 19.9 19.3 19.4 19  At base of 2nd digit 6.5 6.'3 6 6  '$ (Blank rows = not  tested)   Surgery type/Date: Right Lumpectomy and Left benign Lumpectomy:05/05/2022 Number of lymph nodes removed: 2/2  Current/past treatment (chemo, radiation, hormone therapy): radiation, chemo  Other symptoms:  Heaviness/tightness Yes Pain Yes Pitting edema No Infections No  PATIENT EDUCATION:  Education details: Right breast MLD, Exercise after surgery, Lymphedema  Person educated: Patient Education method: Explanation, Demonstration, and Handouts Education comprehension: verbalized understanding and  returned demonstration  HOME EXERCISE PROGRAM: Self MLD for right breast   TODAY'S TREATMENT: Pt permission and consent throughout each step of examination and treatment with modification and draping if requested when working on sensitive areas  01/17/23 Manual: In supine: Short neck, 5 diaphragmatic breaths, bil axillary nodes and establishment of interaxillary pathway, R inguinal nodes and establishment of axilloinguinal pathway, then R breast moving fluid towards pathways spending extra time in any areas of fibrosis then retracing all steps.   Measured pt for flying garments to self buy: Medi size 3 harmony and an example of pantyhose.   12/27/22 Manual: In supine: Short neck, 5 diaphragmatic breaths, bil axillary nodes and establishment of interaxillary pathway, R inguinal nodes and establishment of axilloinguinal pathway, then R breast moving fluid towards pathways spending extra time in any areas of fibrosis then retracing all steps.  STM with arm propped overhead to the pectoralis PROM into flexion and D2   12/06/22 Redid goals and Breast edema questionnaire.   Manual: In supine: Short neck, 5 diaphragmatic breaths, L axillary nodes and establishment of interaxillary pathway, R inguinal nodes and establishment of axilloinguinal pathway, then R breast moving fluid towards pathways spending extra time in any areas of fibrosis then retracing all steps.    ASSESSMENT: CLINICAL IMPRESSION: Pt is doing well in regards to her breast pain and edema.  She is flying to [place] so we measured her for a sleeve.    Pt will benefit from skilled therapeutic intervention to improve on the following deficits: Decreased knowledge of precautions, impaired UE functional use, pain, decreased ROM, postural dysfunction.   PT treatment/interventions: ADL/Self care home management, Therapeutic exercises, Therapeutic activity, Neuromuscular re-education, Balance training, Gait training, Patient/Family education, Self Care, Joint mobilization, Dry Needling, Cryotherapy, Moist heat, Manual lymph drainage, scar mobilization, Taping, and Manual therapy   GOALS: Goals reviewed with patient? Yes  LONG TERM GOALS:  (STG=LTG)  GOALS Name Target Date  Goal status  1 Pt will demonstrate she has regained full shoulder ROM and function post operatively compared to baselines.  Baseline: 09/01/2022 MET  2  Pt will report a dash score of 4.55% to an increase in functional ability and reduction of pain. 11/24/2021 MET  3 Pt will report 75% reduction of pain due to improvements in posture, strength, and muscle length  11/24/2021 MET in regards to the shoulders  4 Patient will be Independent with advanced HEP 11/24/2021 MET  5 Pt will be ind with self MLD and compression for the left breast 01/31/23 MET  6 Pt will decrease breast edema questionnaire to 40/50 to demonstrate decreased symptoms of lymphedema 01/31/23 NEW     PLAN:  PT FREQUENCY/DURATION: 1-2x per week x 6 weeks   PLAN FOR NEXT SESSION:review self MLD, scar work,  breast MLD, gentle stretching   [facility]  [address], [address]  [place] [address]  [phone]  After Breast Cancer Class It is recommended you attend the ABC class to be educated on lymphedema risk reduction. This class is free of charge and lasts for 1 hour. It is a 1-time class. You will need to download the  Web[employer] app either on your phone or computer. We will send you a link the night before or the morning of the class. You should be able to click on that link to join the class. This is not a confidential class. You don't have to turn your camera on, but other participants may be able to see  your email address.  Scar massage You can begin gentle scar massage to you incision sites. Gently place one hand on the incision and move the skin (without sliding on the skin) in various directions. Do this for a few minutes and then you can gently massage either coconut oil or vitamin E cream into the scars.  Compression garment You should continue wearing your compression bra until you feel like you no longer have swelling.  Home exercise Program Continue doing the exercises you were given until you feel like you can do them without feeling any tightness at the end.   Walking Program Studies show that 30 minutes of walking per day (fast enough to elevate your heart rate) can significantly reduce the risk of a cancer recurrence. If you can't walk due to other medical reasons, we encourage you to find another activity you could do (like a stationary bike or water exercise).  Posture After breast cancer surgery, people frequently sit with rounded shoulders posture because it puts their incisions on slack and feels better. If you sit like this and scar tissue forms in that position, you can become very tight and have pain sitting or standing with good posture. Try to be aware of your posture and sit and stand up tall to heal properly.  Follow up PT: It is recommended you return every 3 months for the first 3 years following surgery to be assessed on the SOZO machine for an L-Dex score. This helps prevent clinically significant lymphedema in 95% of patients. These follow up screens are 10 minute appointments that you are not billed for.   Shan Levans, PT  01/17/2023  11:07 AM

## 2023-01-25 DIAGNOSIS — M25561 Pain in right knee: Secondary | ICD-10-CM | POA: Diagnosis not present

## 2023-02-09 DIAGNOSIS — C50919 Malignant neoplasm of unspecified site of unspecified female breast: Secondary | ICD-10-CM | POA: Diagnosis not present

## 2023-02-09 DIAGNOSIS — J209 Acute bronchitis, unspecified: Secondary | ICD-10-CM | POA: Diagnosis not present

## 2023-02-14 ENCOUNTER — Ambulatory Visit: Payer: BC Managed Care – PPO | Attending: Radiation Oncology | Admitting: Rehabilitation

## 2023-02-14 ENCOUNTER — Encounter: Payer: Self-pay | Admitting: Rehabilitation

## 2023-02-14 DIAGNOSIS — R293 Abnormal posture: Secondary | ICD-10-CM | POA: Insufficient documentation

## 2023-02-14 DIAGNOSIS — Z170 Estrogen receptor positive status [ER+]: Secondary | ICD-10-CM | POA: Insufficient documentation

## 2023-02-14 DIAGNOSIS — M79601 Pain in right arm: Secondary | ICD-10-CM | POA: Diagnosis not present

## 2023-02-14 DIAGNOSIS — R601 Generalized edema: Secondary | ICD-10-CM | POA: Diagnosis not present

## 2023-02-14 DIAGNOSIS — C50411 Malignant neoplasm of upper-outer quadrant of right female breast: Secondary | ICD-10-CM | POA: Insufficient documentation

## 2023-02-14 DIAGNOSIS — M6281 Muscle weakness (generalized): Secondary | ICD-10-CM | POA: Diagnosis not present

## 2023-02-14 NOTE — Therapy (Signed)
[facility] OUTPATIENT PHYSICAL THERAPY BREAST CANCER TREATMENT   Patient Name: Julie Atkinson MRN: [id number] DOB:[birth year], 52 y.o., female Today's Date: 02/14/2023   PT End of Session - 02/14/23 0959     Visit Number 12    Number of Visits 13    PT Start Time 1002    PT Stop Time 1053    PT Time Calculation (min) 51 min    Activity Tolerance Patient tolerated treatment well    Behavior During Therapy [facility] for tasks assessed/performed                   Past Medical History:  Diagnosis Date   Cancer    Family history of breast cancer 12/04/2021   Family history of ovarian cancer 12/04/2021   Fibroid 2015   8 mm   History of posttraumatic stress disorder (PTSD) 03-24-2008   due to maternal death-MI   History of radiation therapy    Right breast- 07/15/22-08/23/22- Julie Atkinson   Hyperlipidemia    PTSD (post-traumatic stress disorder)    mother's death 2008-03-24; seizure and AMI while driving, patient performed CPR x 45 minutes   Seropositive for herpes simplex 2 infection Mar 24, 2016   Past Surgical History:  Procedure Laterality Date   BLADDER SURGERY  11/08/1976   nocturnal enuresis treatment   Breast biopsies  03/24/2022   BREAST LUMPECTOMY WITH RADIOACTIVE SEED AND AXILLARY LYMPH NODE DISSECTION  05/03/2022   CESAREAN SECTION  11/08/2005   Dr Julie Atkinson   DILATATION & CURETTAGE/HYSTEROSCOPY WITH MYOSURE N/A 01/06/2016   Procedure: DILATATION & CURETTAGE/HYSTEROSCOPY WITH Julie Atkinson;  Surgeon: Julie Salles, MD;  Location: [facility];  Service: Gynecology;  Laterality: N/A;   DILATION AND CURETTAGE OF UTERUS  11/08/1988   MAB?   Lumpectomies     X 6   PORT-A-CATH REMOVAL N/A 12/30/2022   Procedure: REMOVAL PORT-A-CATH;  Surgeon: Julie Miyamoto, MD;  Location: [facility];  Service: General;  Laterality: N/A;   PORTACATH PLACEMENT Left 12/15/2021   Procedure: INSERTION PORT-A-CATH;  Surgeon: Julie Miyamoto, MD;  Location: [facility];  Service: General;   Laterality: Left;   Total Breast reduction  06/2022   Patient Active Problem List   Diagnosis Date Noted   Port-A-Cath in place 12/16/2021   Family history of breast cancer 12/04/2021   Family history of ovarian cancer 12/04/2021   Genetic testing 12/04/2021   Malignant neoplasm of upper-outer quadrant of right female breast 12/01/2021   Breast lump on right side at 10 o'clock position 10/21/2021   PTSD (post-traumatic stress disorder)     PCP: Julie Agee, NP  REFERRING PROVIDER: Abigail Miyamoto, MD  REFERRING DIAG: Right breast Cancer   THERAPY DIAG:  Muscle weakness (generalized)  Abnormal posture  Pain in right arm  Malignant neoplasm of upper-outer quadrant of right breast in female, estrogen receptor positive  Generalized edema  Rationale for Evaluation and Treatment Rehabilitation  ONSET DATE: 11/17/2021  SUBJECTIVE:  SUBJECTIVE STATEMENT: I had a great trip.  I feel like I can be done with therapy now.   PERTINENT HISTORY:  Patient was diagnosed on 11/17/2021 with right grade grade II invasive ductal and lobular carcinoma breast cancer.. It measures 2.4 cm and is located in the upper outer quadrant. It is triple positive with a Ki67 of 15%. Radiation completed end of October.     05/24/2022- bilateral breast reduction   PATIENT GOALS:  Reassess how my recovery is going related to arm function, pain, and swelling.  PAIN:  Evaluation: Are you having pain? Yes: NPRS scale: 0-2/10 Pain location: Rt breast on the top outside   Pain description: sore Aggravating factors: nothing  Relieving factors: PT visits  PRECAUTIONS: Recent Surgery, right UE Lymphedema risk, None  ACTIVITY LEVEL / LEISURE: She goes to the gym and lifts weights and rides a stationary bike for 7 miles  (30 min) several times per week   OBJECTIVE:   PATIENT SURVEYS:  Breast Complaints: Pain in operated breast- 8 - 7 - 4 A feeling of heaviness in the operate breast-9 - 7 - 6 A swollen breast at the operated side-9 - 7 - 7 The skin feels tensed at the operated breast-10 - 10 - 8 Redness of the skin at the operated breast-6 - 7 - 4 A print of my bra is visible at the operated breast-0 - 0 - 0 The pores of the skin at the operate breast are enlarged-4 - 2 - 0 The operated breast feels hard at some place- 10 - 10 - 8 56/80 on 10/25/22 -  50/80 on 12/06/22,  37/80 on 02/14/23  OBSERVATIONS: Right breast has noticeable swelling with enlarged pores and nipple. The scar is healing as expected. Left breast had some hardening in the lower midline quadrant.   POSTURE:   Forward head and rounded shoulders  LYMPHEDEMA ASSESSMENT:     UPPER EXTREMITY AROM/PROM:   A/PROM Right 12/02/2021 Left 12/02/2021 Right 09/01/2022 Left  09/01/2022 Right 01/17/23  Shoulder extension 47 49 60 60   Shoulder flexion 150 146 180 179 175 - pull in armpit/lateral armpit  Shoulder abduction 157 160 171 167 170 - pull in armpit  Shoulder internal rotation 60 60 65 72   Shoulder external rotation 84 85 90 90 90                          (Blank rows = not tested)    CERVICAL AROM: All within normal limits   UPPER EXTREMITY STRENGTH: WNL    LYMPHEDEMA ASSESSMENTS:    LANDMARK RIGHT 12/02/2021 LEFT 12/02/2021 Right  09/01/2022 Left  09/01/2022  10 cm proximal to olecranon process 31.1 32.8 30 32  Olecranon process 26.3 26.7 25.8 25.8  10 cm proximal to ulnar styloid process 23.6 22.2 21.2 19.9  Just proximal to ulnar styloid process 16.4 16.3 15.6 16.2  Across hand at thumb web space 19.9 19.3 19.4 19  At base of 2nd digit 6.5 6.3 6 6   (Blank rows = not tested)   Surgery type/Date: Right Lumpectomy and Left benign Lumpectomy:05/05/2022 Number of lymph nodes removed: 2/2  Current/past treatment (chemo,  radiation, hormone therapy): radiation, chemo  Other symptoms:  Heaviness/tightness Yes Pain Yes Pitting edema No Infections No  PATIENT EDUCATION:  Education details: Right breast MLD, Exercise after surgery, Lymphedema  Person educated: Patient Education method: Explanation, Demonstration, and Handouts Education comprehension: verbalized understanding and returned demonstration  HOME EXERCISE  PROGRAM: Self MLD for right breast   TODAY'S TREATMENT: Pt permission and consent throughout each step of examination and treatment with modification and draping if requested when working on sensitive areas  4/8//24 Manual: In supine: Short neck, 5 diaphragmatic breaths, bil axillary nodes and establishment of interaxillary pathway, R inguinal nodes and establishment of axilloinguinal pathway, then R breast moving fluid towards pathways spending extra time in any areas of fibrosis then retracing all steps.  STM Rt axilla and lateral breast with scar tissue work included PROM to the Rt shoulder   01/17/23 Manual: In supine: Short neck, 5 diaphragmatic breaths, bil axillary nodes and establishment of interaxillary pathway, R inguinal nodes and establishment of axilloinguinal pathway, then R breast moving fluid towards pathways spending extra time in any areas of fibrosis then retracing all steps.   Measured pt for flying garments to self buy: Medi size 3 harmony and an example of pantyhose.   12/27/22 Manual: In supine: Short neck, 5 diaphragmatic breaths, bil axillary nodes and establishment of interaxillary pathway, R inguinal nodes and establishment of axilloinguinal pathway, then R breast moving fluid towards pathways spending extra time in any areas of fibrosis then retracing all steps.  STM with arm propped overhead to the pectoralis PROM into flexion and D2   12/06/22 Redid goals and Breast edema questionnaire.   Manual: In supine: Short neck, 5 diaphragmatic breaths, L axillary  nodes and establishment of interaxillary pathway, R inguinal nodes and establishment of axilloinguinal pathway, then R breast moving fluid towards pathways spending extra time in any areas of fibrosis then retracing all steps.   ASSESSMENT: CLINICAL IMPRESSION: Pt is doing well in regards to her breast pain and edema.  She has met all goals but will finish out her last scheduled visit for SOZO and one last session as she still feels better after MT.   Pt will benefit from skilled therapeutic intervention to improve on the following deficits: Decreased knowledge of precautions, impaired UE functional use, pain, decreased ROM, postural dysfunction.   PT treatment/interventions: ADL/Self care home management, Therapeutic exercises, Therapeutic activity, Neuromuscular re-education, Balance training, Gait training, Patient/Family education, Self Care, Joint mobilization, Dry Needling, Cryotherapy, Moist heat, Manual lymph drainage, scar mobilization, Taping, and Manual therapy   GOALS: Goals reviewed with patient? Yes  LONG TERM GOALS:  (STG=LTG)  GOALS Name Target Date  Goal status  1 Pt will demonstrate she has regained full shoulder ROM and function post operatively compared to baselines.  Baseline: 09/01/2022 MET  2  Pt will report a dash score of 4.55% to an increase in functional ability and reduction of pain. 11/24/2021 MET  3 Pt will report 75% reduction of pain due to improvements in posture, strength, and muscle length  11/24/2021 MET in regards to the shoulders  4 Patient will be Independent with advanced HEP 11/24/2021 MET  5 Pt will be ind with self MLD and compression for the left breast 01/31/23 MET  6 Pt will decrease breast edema questionnaire to 40/50 to demonstrate decreased symptoms of lymphedema 01/31/23 MET     PLAN:  PT FREQUENCY/DURATION: 1-2x per week x 6 weeks   PLAN FOR NEXT SESSION: last session, Do SOZO, schedule out.   [facility]  [address]  [place] [address]  [phone]  After Breast Cancer Class It is recommended you attend the ABC class to be educated on lymphedema risk reduction. This class is free of charge and lasts for 1 hour. It  is a 1-time class. You will need to download the Webex app either on your phone or computer. We will send you a link the night before or the morning of the class. You should be able to click on that link to join the class. This is not a confidential class. You don't have to turn your camera on, but other participants may be able to see your email address.  Scar massage You can begin gentle scar massage to you incision sites. Gently place one hand on the incision and move the skin (without sliding on the skin) in various directions. Do this for a few minutes and then you can gently massage either coconut oil or vitamin E cream into the scars.  Compression garment You should continue wearing your compression bra until you feel like you no longer have swelling.  Home exercise Program Continue doing the exercises you were given until you feel like you can do them without feeling any tightness at the end.   Walking Program Studies show that 30 minutes of walking per day (fast enough to elevate your heart rate) can significantly reduce the risk of a cancer recurrence. If you can't walk due to other medical reasons, we encourage you to find another activity you could do (like a stationary bike or water exercise).  Posture After breast cancer surgery, people frequently sit with rounded shoulders posture because it puts their incisions on slack and feels better. If you sit like this and scar tissue forms in that position, you can become very tight and have pain sitting or standing with good posture. Try to be aware of your posture and sit and stand up tall to heal properly.  Follow up PT: It is recommended you return every 3 months for the first 3 years following surgery to be assessed  on the SOZO machine for an L-Dex score. This helps prevent clinically significant lymphedema in 95% of patients. These follow up screens are 10 minute appointments that you are not billed for.   Gwenevere Abbot, PT  02/14/2023  9:00 PM

## 2023-02-15 DIAGNOSIS — F411 Generalized anxiety disorder: Secondary | ICD-10-CM | POA: Diagnosis not present

## 2023-02-15 DIAGNOSIS — F410 Panic disorder [episodic paroxysmal anxiety] without agoraphobia: Secondary | ICD-10-CM | POA: Diagnosis not present

## 2023-02-15 DIAGNOSIS — Z1331 Encounter for screening for depression: Secondary | ICD-10-CM | POA: Diagnosis not present

## 2023-02-28 ENCOUNTER — Ambulatory Visit: Payer: BC Managed Care – PPO | Admitting: Rehabilitation

## 2023-02-28 ENCOUNTER — Encounter: Payer: Self-pay | Admitting: Rehabilitation

## 2023-02-28 ENCOUNTER — Ambulatory Visit: Payer: Self-pay

## 2023-02-28 DIAGNOSIS — M6281 Muscle weakness (generalized): Secondary | ICD-10-CM | POA: Diagnosis not present

## 2023-02-28 DIAGNOSIS — C50411 Malignant neoplasm of upper-outer quadrant of right female breast: Secondary | ICD-10-CM | POA: Diagnosis not present

## 2023-02-28 DIAGNOSIS — R601 Generalized edema: Secondary | ICD-10-CM | POA: Diagnosis not present

## 2023-02-28 DIAGNOSIS — Z170 Estrogen receptor positive status [ER+]: Secondary | ICD-10-CM

## 2023-02-28 DIAGNOSIS — R293 Abnormal posture: Secondary | ICD-10-CM

## 2023-02-28 DIAGNOSIS — M79601 Pain in right arm: Secondary | ICD-10-CM | POA: Diagnosis not present

## 2023-02-28 NOTE — Therapy (Signed)
[facility] OUTPATIENT PHYSICAL THERAPY BREAST CANCER TREATMENT   Patient Name: Julie Atkinson MRN: [id number] DOB:[birth year], 52 y.o., female Today's Date: 02/28/2023   PT End of Session - 02/28/23 0958     Visit Number 13    Number of Visits 13    PT Start Time 1000    PT Stop Time 1049    PT Time Calculation (min) 49 min    Activity Tolerance Patient tolerated treatment well    Behavior During Therapy [facility] for tasks assessed/performed                   Past Medical History:  Diagnosis Date   Cancer    Family history of breast cancer 12/04/2021   Family history of ovarian cancer 12/04/2021   Fibroid 2015   8 mm   History of posttraumatic stress disorder (PTSD) 03/27/08   due to maternal death-MI   History of radiation therapy    Right breast- 07/15/22-08/23/22- Dr. Antony Blackbird   Hyperlipidemia    PTSD (post-traumatic stress disorder)    mother's death 2008-03-27; seizure and AMI while driving, patient performed CPR x 45 minutes   Seropositive for herpes simplex 2 infection Mar 27, 2016   Past Surgical History:  Procedure Laterality Date   BLADDER SURGERY  11/08/1976   nocturnal enuresis treatment   Breast biopsies  03-27-2022   BREAST LUMPECTOMY WITH RADIOACTIVE SEED AND AXILLARY LYMPH NODE DISSECTION  05/03/2022   CESAREAN SECTION  11/08/2005   Dr Edward Jolly   DILATATION & CURETTAGE/HYSTEROSCOPY WITH MYOSURE N/A 01/06/2016   Procedure: DILATATION & CURETTAGE/HYSTEROSCOPY WITH Jackquline Denmark;  Surgeon: Patton Salles, MD;  Location: [facility];  Service: Gynecology;  Laterality: N/A;   DILATION AND CURETTAGE OF UTERUS  11/08/1988   MAB?   Lumpectomies     X 6   PORT-A-CATH REMOVAL N/A 12/30/2022   Procedure: REMOVAL PORT-A-CATH;  Surgeon: Abigail Miyamoto, MD;  Location: [facility];  Service: General;  Laterality: N/A;   PORTACATH PLACEMENT Left 12/15/2021   Procedure: INSERTION PORT-A-CATH;  Surgeon: Abigail Miyamoto, MD;  Location:  SURGERY CENTER;  Service: General;   Laterality: Left;   Total Breast reduction  06/2022   Patient Active Problem List   Diagnosis Date Noted   Port-A-Cath in place 12/16/2021   Family history of breast cancer 12/04/2021   Family history of ovarian cancer 12/04/2021   Genetic testing 12/04/2021   Malignant neoplasm of upper-outer quadrant of right female breast 12/01/2021   Breast lump on right side at 10 o'clock position 10/21/2021   PTSD (post-traumatic stress disorder)     PCP: Janeece Agee, NP  REFERRING PROVIDER: Abigail Miyamoto, MD  REFERRING DIAG: Right breast Cancer   THERAPY DIAG:  Muscle weakness (generalized)  Abnormal posture  Pain in right arm  Generalized edema  Malignant neoplasm of upper-outer quadrant of right breast in female, estrogen receptor positive  Rationale for Evaluation and Treatment Rehabilitation  ONSET DATE: 11/17/2021  SUBJECTIVE:  SUBJECTIVE STATEMENT: I just get random breast pain and it still feels hard but other than that I feel ready to be done.   PERTINENT HISTORY:  Patient was diagnosed on 11/17/2021 with right grade grade II invasive ductal and lobular carcinoma breast cancer.. It measures 2.4 cm and is located in the upper outer quadrant. It is triple positive with a Ki67 of 15%. Radiation completed end of October.     05/24/2022- bilateral breast reduction   PATIENT GOALS:  Reassess how my recovery is going related to arm function, pain, and swelling.  PAIN:  Evaluation: Are you having pain? Yes: NPRS scale: 0-2/10 Pain location: Rt breast on the top outside   Pain description: sore Aggravating factors: nothing  Relieving factors: PT visits  PRECAUTIONS: Recent Surgery, right UE Lymphedema risk, None  ACTIVITY LEVEL / LEISURE: She goes to the gym and lifts weights and  rides a stationary bike for 7 miles (30 min) several times per week   OBJECTIVE:   PATIENT SURVEYS:  Breast Complaints: Pain in operated breast- 8 - 7 - 4 - 3 A feeling of heaviness in the operate breast-9 - 7 - 6 - 5 A swollen breast at the operated side-9 - 7 - 7 - 3 The skin feels tensed at the operated breast-10 - 10 - 8 - 8 Redness of the skin at the operated breast-6 - 7 - 4 - 1 A print of my bra is visible at the operated breast-0 - 0 - 0 - 0 The pores of the skin at the operate breast are enlarged-4 - 2 - 0 - 0 The operated breast feels hard at some place- 10 - 10 - 8 - 8 56/80 on 10/25/22 -  50/80 on 12/06/22,  37/80 on 02/14/23, 28/50 on 02/28/23  OBSERVATIONS: Right breast has noticeable swelling with enlarged pores and nipple. The scar is healing as expected. Left breast had some hardening in the lower midline quadrant.   POSTURE:   Forward head and rounded shoulders  LYMPHEDEMA ASSESSMENT:     UPPER EXTREMITY AROM/PROM:   A/PROM Right 12/02/2021 Left 12/02/2021 Right 09/01/2022 Left  09/01/2022 Right 01/17/23 02/28/23  Shoulder extension 47 49 60 60    Shoulder flexion 150 146 180 179 175 - pull in armpit/lateral armpit 175  Shoulder abduction 157 160 171 167 170 - pull in armpit 170  Shoulder internal rotation 60 60 65 72    Shoulder external rotation 84 85 90 90 90                           (Blank rows = not tested)    CERVICAL AROM: All within normal limits   UPPER EXTREMITY STRENGTH: WNL    LYMPHEDEMA ASSESSMENTS:    LANDMARK RIGHT 12/02/2021 LEFT 12/02/2021 Right  09/01/2022 Left  09/01/2022  10 cm proximal to olecranon process 31.1 32.8 30 32  Olecranon process 26.3 26.7 25.8 25.8  10 cm proximal to ulnar styloid process 23.6 22.2 21.2 19.9  Just proximal to ulnar styloid process 16.4 16.3 15.6 16.2  Across hand at thumb web space 19.9 19.3 19.4 19  At base of 2nd digit 6.5 6.3 6 6   (Blank rows = not tested)   Surgery type/Date: Right Lumpectomy  and Left benign Lumpectomy:05/05/2022 Number of lymph nodes removed: 2/2  Current/past treatment (chemo, radiation, hormone therapy): radiation, chemo  Other symptoms:  Heaviness/tightness Yes Pain Yes Pitting edema No Infections No  PATIENT EDUCATION:  Education details: Right breast MLD, Exercise after surgery, Lymphedema  Person educated: Patient Education method: Explanation, Demonstration, and Handouts Education comprehension: verbalized understanding and returned demonstration  HOME EXERCISE PROGRAM: Self MLD for right breast   TODAY'S TREATMENT: Pt permission and consent throughout each step of examination and treatment with modification and draping if requested when working on sensitive areas  02/28/23 Manual: DC visit / goal check SOZO performed and scheduled out In supine: Short neck, 5 diaphragmatic breaths, bil axillary nodes and establishment of interaxillary pathway, R inguinal nodes and establishment of axilloinguinal pathway, then R breast moving fluid towards pathways spending extra time in any areas of fibrosis then retracing all steps.  STM Rt axilla and lateral breast with scar tissue work included PROM to the Rt shoulder   4/8//24 Manual: In supine: Short neck, 5 diaphragmatic breaths, bil axillary nodes and establishment of interaxillary pathway, R inguinal nodes and establishment of axilloinguinal pathway, then R breast moving fluid towards pathways spending extra time in any areas of fibrosis then retracing all steps.  STM Rt axilla and lateral breast with scar tissue work included PROM to the Rt shoulder   01/17/23 Manual: In supine: Short neck, 5 diaphragmatic breaths, bil axillary nodes and establishment of interaxillary pathway, R inguinal nodes and establishment of axilloinguinal pathway, then R breast moving fluid towards pathways spending extra time in any areas of fibrosis then retracing all steps.   Measured pt for flying garments to self  buy: Medi size 3 harmony and an example of pantyhose.   12/27/22 Manual: In supine: Short neck, 5 diaphragmatic breaths, bil axillary nodes and establishment of interaxillary pathway, R inguinal nodes and establishment of axilloinguinal pathway, then R breast moving fluid towards pathways spending extra time in any areas of fibrosis then retracing all steps.  STM with arm propped overhead to the pectoralis PROM into flexion and D2   12/06/22 Redid goals and Breast edema questionnaire.   Manual: In supine: Short neck, 5 diaphragmatic breaths, L axillary nodes and establishment of interaxillary pathway, R inguinal nodes and establishment of axilloinguinal pathway, then R breast moving fluid towards pathways spending extra time in any areas of fibrosis then retracing all steps.   ASSESSMENT: CLINICAL IMPRESSION: Pt is ready for DC from formal visits but will continue lymphedema surveillance.  All goals met.   Pt will benefit from skilled therapeutic intervention to improve on the following deficits: Decreased knowledge of precautions, impaired UE functional use, pain, decreased ROM, postural dysfunction.   PT treatment/interventions: ADL/Self care home management, Therapeutic exercises, Therapeutic activity, Neuromuscular re-education, Balance training, Gait training, Patient/Family education, Self Care, Joint mobilization, Dry Needling, Cryotherapy, Moist heat, Manual lymph drainage, scar mobilization, Taping, and Manual therapy   GOALS: Goals reviewed with patient? Yes  LONG TERM GOALS:  (STG=LTG)  GOALS Name Target Date  Goal status  1 Pt will demonstrate she has regained full shoulder ROM and function post operatively compared to baselines.  Baseline: 09/01/2022 MET  2  Pt will report a dash score of 4.55% to an increase in functional ability and reduction of pain. 11/24/2021 MET  3 Pt will report 75% reduction of pain due to improvements in posture, strength, and muscle length   11/24/2021 MET in regards to the shoulders  4 Patient will be Independent with advanced HEP 11/24/2021 MET  5 Pt will be ind with self MLD and compression for the left breast 01/31/23 MET  6 Pt will decrease breast edema questionnaire to 40/50 to demonstrate decreased symptoms  of lymphedema 01/31/23 MET     PLAN:  PT FREQUENCY/DURATION: 1-2x per week x 6 weeks   PLAN FOR NEXT SESSION: last session, Do SOZO, schedule out.   [facility]  [address]  [place] [address]  [phone]  After Breast Cancer Class It is recommended you attend the ABC class to be educated on lymphedema risk reduction. This class is free of charge and lasts for 1 hour. It is a 1-time class. You will need to download the Webex app either on your phone or computer. We will send you a link the night before or the morning of the class. You should be able to click on that link to join the class. This is not a confidential class. You don't have to turn your camera on, but other participants may be able to see your email address.  Scar massage You can begin gentle scar massage to you incision sites. Gently place one hand on the incision and move the skin (without sliding on the skin) in various directions. Do this for a few minutes and then you can gently massage either coconut oil or vitamin E cream into the scars.  Compression garment You should continue wearing your compression bra until you feel like you no longer have swelling.  Home exercise Program Continue doing the exercises you were given until you feel like you can do them without feeling any tightness at the end.   Walking Program Studies show that 30 minutes of walking per day (fast enough to elevate your heart rate) can significantly reduce the risk of a cancer recurrence. If you can't walk due to other medical reasons, we encourage you to find another activity you could do (like a stationary bike or water  exercise).  Posture After breast cancer surgery, people frequently sit with rounded shoulders posture because it puts their incisions on slack and feels better. If you sit like this and scar tissue forms in that position, you can become very tight and have pain sitting or standing with good posture. Try to be aware of your posture and sit and stand up tall to heal properly.  Follow up PT: It is recommended you return every 3 months for the first 3 years following surgery to be assessed on the SOZO machine for an L-Dex score. This helps prevent clinically significant lymphedema in 95% of patients. These follow up screens are 10 minute appointments that you are not billed for.   Gwenevere Abbot, PT  02/28/2023  12:38 PM    PHYSICAL THERAPY DISCHARGE SUMMARY  Visits from Start of Care: 13  Current functional level related to goals / functional outcomes: See above   Remaining deficits: Lymphedema risk, breast lymphedema    Education / Equipment: Final HEP  Plan: Patient agrees to discharge.  Patient is being discharged due to meeting the stated rehab goals.

## 2023-03-01 ENCOUNTER — Other Ambulatory Visit: Payer: Self-pay

## 2023-03-16 DIAGNOSIS — J310 Chronic rhinitis: Secondary | ICD-10-CM | POA: Diagnosis not present

## 2023-03-16 DIAGNOSIS — J342 Deviated nasal septum: Secondary | ICD-10-CM | POA: Diagnosis not present

## 2023-03-16 DIAGNOSIS — J343 Hypertrophy of nasal turbinates: Secondary | ICD-10-CM | POA: Diagnosis not present

## 2023-03-23 ENCOUNTER — Other Ambulatory Visit: Payer: Self-pay | Admitting: Otolaryngology

## 2023-03-29 ENCOUNTER — Other Ambulatory Visit: Payer: Self-pay

## 2023-04-12 DIAGNOSIS — M25561 Pain in right knee: Secondary | ICD-10-CM | POA: Diagnosis not present

## 2023-04-19 DIAGNOSIS — M25561 Pain in right knee: Secondary | ICD-10-CM | POA: Diagnosis not present

## 2023-05-23 ENCOUNTER — Other Ambulatory Visit: Payer: Self-pay | Admitting: Hematology and Oncology

## 2023-05-24 NOTE — Progress Notes (Signed)
Patient Care Team: Pcp, No as PCP - General Branch, Alben Spittle, MD as PCP - Cardiology (Cardiology) Abigail Miyamoto, MD as Consulting Physician (General Surgery) Serena Croissant, MD as Consulting Physician (Hematology and Oncology) Antony Blackbird, MD as Consulting Physician (Radiation Oncology)  DIAGNOSIS:  Encounter Diagnosis  Name Primary?   Malignant neoplasm of upper-outer quadrant of right breast in female, estrogen receptor positive (HCC) Yes    SUMMARY OF ONCOLOGIC HISTORY: Oncology History  Malignant neoplasm of upper-outer quadrant of right female breast (HCC)  11/19/2021 Initial Diagnosis   Palpable lump for 3 weeks, diagnostic mammogram: 2.2 cm x 2.2 cm irregular mass with a spiculated margin in the upper outer right breast. 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 HER2 positive, Ki-67 15%   12/02/2021 Cancer Staging   Staging form: Breast, AJCC 8th Edition - Clinical stage from 12/02/2021: Stage IB (cT2, cN0, cM0, G2, ER+, PR+, HER2+) - Signed by Serena Croissant, MD on 12/02/2021 Stage prefix: Initial diagnosis Histologic grading system: 3 grade system   12/16/2021 - 05/12/2022 Chemotherapy   Patient is on Treatment Plan : BREAST  Docetaxel + Carboplatin + Trastuzumab + Pertuzumab  (TCHP) q21d      01/22/2022 Genetic Testing   Negative hereditary cancer genetic testing: no pathogenic variants detected in [facility] CancerNext +RNAinsight Panel.  Report date is 01/22/2022.   The CancerNext gene panel offered by [employer] includes sequencing, rearrangement analysis, and RNA analysis for the following 36 genes:   APC, ATM, AXIN2, BARD1, BMPR1A, BRCA1, BRCA2, BRIP1, CDH1, CDK4, CDKN2A, CHEK2, DICER1, HOXB13, EPCAM, GREM1, MLH1, MSH2, MSH3, MSH6, MUTYH, NBN, NF1, NTHL1, PALB2, PMS2, POLD1, POLE, PTEN, RAD51C, RAD51D, RECQL, SMAD4, SMARCA4, STK11, and TP53.    04/22/2022 -  Chemotherapy   Patient is on Treatment Plan : BREAST MAINTENANCE Trastuzumab IV (6) or SQ (600) D1 q21d X 11  Cycles     05/05/2022 Surgery   Rt retroareolar lumpectomy and Rt UOQ lumpectomy: No residual cancer, Lt Lumpectomy: Benign Complete Pathological Response   06/04/2022 - 07/02/2022 Chemotherapy   Patient is on Treatment Plan : BREAST Trastuzumab  + Pertuzumab q21d x 13 cycles     07/16/2022 - 08/23/2022 Radiation Therapy   50.4 Gy in 28 treatments to Right Breast   09/2022 -  Anti-estrogen oral therapy   Tamoxifen x 5-10 years     CHIEF COMPLIANT:  Follow-up right breast cancer on tamoxifen   INTERVAL HISTORY: Julie Atkinson is a 52 y.o. with above-mentioned history of right breast cancer, currently on chemotherapy with TCHP and tamoxifen. She presents to the clinic today for follow-up. Pt reports that she is tolerating the tamoxifen extremely well. She experience knee pain on the right. Pian did subside from snorkeling. She does have terrible bone pain. If she sit for too long feet hurts. Once she ges up and moving the pain goes away.   ALLERGIES:  is allergic to iodinated contrast media, latex, codeine, and tegaderm ag mesh [silver].  MEDICATIONS:  Current Outpatient Medications  Medication Sig Dispense Refill   clindamycin (CLINDAGEL) 1 % gel Apply topically 2 (two) times daily. As needed 30 g 6   FLUoxetine (PROZAC) 10 MG capsule Take 1 capsule (10 mg total) by mouth daily. 90 capsule 1   tamoxifen (NOLVADEX) 20 MG tablet Take 1 tablet (20 mg total) by mouth daily. 90 tablet 3   No current facility-administered medications for this visit.    PHYSICAL EXAMINATION: ECOG PERFORMANCE STATUS: 1 - Symptomatic but completely ambulatory  Vitals:   05/27/23 1014  BP: 128/89  Pulse: 70  Resp: 18  Temp: (!) 97.2 F (36.2 C)  SpO2: 97%   Filed Weights   05/27/23 1014  Weight: 196 lb 9.6 oz (89.2 kg)    BREAST: medial left breast palpable nodularity. No palpable axillary supraclavicular or infraclavicular adenopathy no breast tenderness or nipple discharge. (exam performed in  the presence of a chaperone)  LABORATORY DATA:  I have reviewed the data as listed    Latest Ref Rng & Units 11/25/2022    9:23 AM 11/03/2022    2:04 PM 09/03/2022    9:47 AM  CMP  Glucose 70 - 99 mg/dL 78  161  89   BUN 6 - 20 mg/dL 12  13  15    Creatinine 0.44 - 1.00 mg/dL 0.96  0.45  4.09   Sodium 135 - 145 mmol/L 140  139  140   Potassium 3.5 - 5.1 mmol/L 4.1  3.5  4.2   Chloride 98 - 111 mmol/L 108  110  107   CO2 22 - 32 mmol/L 24  22  26    Calcium 8.9 - 10.3 mg/dL 9.0  9.0  9.2   Total Protein 6.5 - 8.1 g/dL 6.7  7.0  7.5   Total Bilirubin 0.3 - 1.2 mg/dL 0.3  0.6  0.6   Alkaline Phos 38 - 126 U/L 63  57  62   AST 15 - 41 U/L 15  17  16    ALT 0 - 44 U/L 12  14  15      Lab Results  Component Value Date   WBC 3.8 (L) 11/25/2022   HGB 13.1 11/25/2022   HCT 36.8 11/25/2022   MCV 96.8 11/25/2022   PLT 200 11/25/2022   NEUTROABS 2.0 11/25/2022    ASSESSMENT & PLAN:  Malignant neoplasm of upper-outer quadrant of right female breast (HCC) 11/19/2021:Palpable lump for 3 weeks, diagnostic mammogram: 2.2 cm x 2.2 cm irregular mass with a spiculated margin in the upper outer right breast. 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 HER2 positive, Ki-67 15% MRI Breast 12/09/21:  Right Breast retroareolar mass 6.7 cm (involves nipple areolar complex), Rt axilla 2.1 cm node, Left breast: Several scattered foci left breast indeterminate 0.6 cm mass LIQ left breast and another indeterminate 0.6 cm mass in upper central Left breast   Treatment Plan: 1. Neoadjuvant chemotherapy with TCHP foll by Herceptin maintenance completed 11/25/22  2. 05/05/22: Bilateral lumpectomies: Path CR 3. Adjuvant radiation therapy 07/16/2022-08/23/2022 4.  Adjuvant antiestrogen therapy with tamoxifen started 10/04/2022 ------------------------------------------------------------------------------------------------------------------------------------ Current Treatment: Tamoxifen   Tamoxifen toxicities:   Joint pains: advised reducing dose of Tamoxifen to 10 mg daily   Breast cancer surveillance: M[facility]: Postoperative changes of the bilateral breasts ultrasound performed to evaluate hardening and soreness of breast: Benign, density category B Breast Exam: 05/27/23: scar tissue, benign    Discussed with her the importance of exercise and making lifestyle changes to lose weight. She went to [place] and [employer] (made an album and gifted me that) Return to clinic in 1 year for follow-up    Orders Placed This Encounter  Procedures   MM DIAG BREAST TOMO UNI LEFT    Standing Status:   Future    Standing Expiration Date:   05/26/2024    Order Specific Question:   Reason for Exam (SYMPTOM  OR DIAGNOSIS REQUIRED)    Answer:   left breast lump    Order  Specific Question:   Is the patient pregnant?    Answer:   No    Order Specific Question:   Preferred imaging location?    Answer:   External    Comments:   [facility]   [place] LIMITED ULTRASOUND INCLUDING AXILLA LEFT BREAST     Standing Status:   Future    Standing Expiration Date:   05/26/2024    Order Specific Question:   Reason for Exam (SYMPTOM  OR DIAGNOSIS REQUIRED)    Answer:   left breast lump    Comments:   [facility]    Order Specific Question:   Preferred imaging location?    Answer:   External    Order Specific Question:   Release to patient    Answer:   Immediate   The patient has a good understanding of the overall plan. she agrees with it. she will call with any problems that may develop before the next visit here. Total time spent: 30 mins including face to face time and time spent for planning, charting and co-ordination of care   Tamsen Meek, MD 05/27/23    I Janan Ridge am acting as a Neurosurgeon for [employer]  I have reviewed the above documentation for accuracy and completeness, and I agree with the above.

## 2023-05-27 ENCOUNTER — Inpatient Hospital Stay: Payer: BC Managed Care – PPO | Attending: Hematology and Oncology | Admitting: Hematology and Oncology

## 2023-05-27 ENCOUNTER — Other Ambulatory Visit: Payer: Self-pay

## 2023-05-27 VITALS — BP 128/89 | HR 70 | Temp 97.2°F | Resp 18 | Ht 67.0 in | Wt 196.6 lb

## 2023-05-27 DIAGNOSIS — Z923 Personal history of irradiation: Secondary | ICD-10-CM | POA: Diagnosis not present

## 2023-05-27 DIAGNOSIS — C50411 Malignant neoplasm of upper-outer quadrant of right female breast: Secondary | ICD-10-CM | POA: Diagnosis not present

## 2023-05-27 DIAGNOSIS — Z9221 Personal history of antineoplastic chemotherapy: Secondary | ICD-10-CM | POA: Insufficient documentation

## 2023-05-27 DIAGNOSIS — Z170 Estrogen receptor positive status [ER+]: Secondary | ICD-10-CM | POA: Insufficient documentation

## 2023-05-27 DIAGNOSIS — Z79810 Long term (current) use of selective estrogen receptor modulators (SERMs): Secondary | ICD-10-CM | POA: Insufficient documentation

## 2023-05-27 MED ORDER — CLINDAMYCIN PHOSPHATE 1 % EX GEL: Freq: Two times a day (BID) | CUTANEOUS | 6 refills | Status: AC

## 2023-05-27 NOTE — Assessment & Plan Note (Addendum)
11/19/2021:Palpable lump for 3 weeks, diagnostic mammogram: 2.2 cm x 2.2 cm irregular mass with a spiculated margin in the upper outer right breast. 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 HER2 positive, Ki-67 15% MRI Breast 12/09/21:  Right Breast retroareolar mass 6.7 cm (involves nipple areolar complex), Rt axilla 2.1 cm node, Left breast: Several scattered foci left breast indeterminate 0.6 cm mass LIQ left breast and another indeterminate 0.6 cm mass in upper central Left breast   Treatment Plan: 1. Neoadjuvant chemotherapy with TCHP foll by Herceptin maintenance completed 11/25/22  2. 05/05/22: Bilateral lumpectomies: Path CR 3. Adjuvant radiation therapy 07/16/2022-08/23/2022 4.  Adjuvant antiestrogen therapy with tamoxifen started 10/04/2022 ------------------------------------------------------------------------------------------------------------------------------------ Current Treatment: Tamoxifen   Tamoxifen toxicities:  Joint pains: advised reducing dose of Tamoxifen to 10 mg daily   Breast cancer surveillance: Mammogram 11/16/2022 at [facility]: Postoperative changes of the bilateral breasts ultrasound performed to evaluate hardening and soreness of breast: Benign, density category B Breast Exam: 05/27/23: scar tissue, benign   Discussed the role of Neratinib Discussed with her the importance of exercise and making lifestyle changes to lose weight.  Return to clinic in 1 year for follow-up

## 2023-05-28 ENCOUNTER — Encounter: Payer: Self-pay | Admitting: Hematology and Oncology

## 2023-05-28 DIAGNOSIS — U071 COVID-19: Secondary | ICD-10-CM | POA: Diagnosis not present

## 2023-05-30 ENCOUNTER — Other Ambulatory Visit: Payer: Self-pay

## 2023-05-30 ENCOUNTER — Ambulatory Visit: Payer: BC Managed Care – PPO | Attending: Radiation Oncology | Admitting: Rehabilitation

## 2023-06-02 DIAGNOSIS — N6325 Unspecified lump in the left breast, overlapping quadrants: Secondary | ICD-10-CM | POA: Diagnosis not present

## 2023-06-07 ENCOUNTER — Encounter: Payer: Self-pay | Admitting: Hematology and Oncology

## 2023-06-08 DIAGNOSIS — Z803 Family history of malignant neoplasm of breast: Secondary | ICD-10-CM | POA: Diagnosis not present

## 2023-06-08 DIAGNOSIS — Z411 Encounter for cosmetic surgery: Secondary | ICD-10-CM | POA: Diagnosis not present

## 2023-06-27 ENCOUNTER — Encounter (HOSPITAL_BASED_OUTPATIENT_CLINIC_OR_DEPARTMENT_OTHER): Admission: RE | Payer: Self-pay | Source: Home / Self Care

## 2023-06-27 ENCOUNTER — Ambulatory Visit (HOSPITAL_BASED_OUTPATIENT_CLINIC_OR_DEPARTMENT_OTHER): Admission: RE | Admit: 2023-06-27 | Payer: BC Managed Care – PPO | Source: Home / Self Care | Admitting: Otolaryngology

## 2023-06-27 SURGERY — SEPTOPLASTY, NOSE, WITH NASAL TURBINATE REDUCTION
Anesthesia: General | Laterality: Bilateral

## 2023-08-10 DIAGNOSIS — Z803 Family history of malignant neoplasm of breast: Secondary | ICD-10-CM | POA: Diagnosis not present

## 2023-08-10 DIAGNOSIS — Z428 Encounter for other plastic and reconstructive surgery following medical procedure or healed injury: Secondary | ICD-10-CM | POA: Diagnosis not present

## 2023-08-25 DIAGNOSIS — J348202 Internal nasal valve collapse, dynamic: Secondary | ICD-10-CM | POA: Diagnosis not present

## 2023-08-25 DIAGNOSIS — J343 Hypertrophy of nasal turbinates: Secondary | ICD-10-CM | POA: Diagnosis not present

## 2023-08-25 DIAGNOSIS — J349 Unspecified disorder of nose and nasal sinuses: Secondary | ICD-10-CM | POA: Diagnosis not present

## 2023-08-25 DIAGNOSIS — J342 Deviated nasal septum: Secondary | ICD-10-CM | POA: Diagnosis not present

## 2023-08-25 DIAGNOSIS — J3489 Other specified disorders of nose and nasal sinuses: Secondary | ICD-10-CM | POA: Diagnosis not present

## 2023-08-25 DIAGNOSIS — J348200 Internal nasal valve collapse, unspecified: Secondary | ICD-10-CM | POA: Diagnosis not present

## 2023-08-29 ENCOUNTER — Ambulatory Visit (INDEPENDENT_AMBULATORY_CARE_PROVIDER_SITE_OTHER): Payer: BC Managed Care – PPO | Admitting: Otolaryngology

## 2023-08-29 ENCOUNTER — Encounter (INDEPENDENT_AMBULATORY_CARE_PROVIDER_SITE_OTHER): Payer: Self-pay

## 2023-08-29 VITALS — Ht 68.0 in | Wt 190.0 lb

## 2023-08-29 DIAGNOSIS — J310 Chronic rhinitis: Secondary | ICD-10-CM | POA: Insufficient documentation

## 2023-08-29 DIAGNOSIS — Z9889 Other specified postprocedural states: Secondary | ICD-10-CM

## 2023-08-29 NOTE — Progress Notes (Unsigned)
Patient ID: Julie Atkinson, female   DOB: 10/15/1971, 52 y.o.   MRN: [id number]  Ralph Leyden splints removed. Septum and turbinates are healing well.   Both Crosby debrided.  Nasal saline irrigation.  Recheck in 3 weeks.

## 2023-09-22 ENCOUNTER — Encounter: Payer: Self-pay | Admitting: Emergency Medicine

## 2023-09-22 ENCOUNTER — Ambulatory Visit: Payer: BC Managed Care – PPO | Admitting: Emergency Medicine

## 2023-09-22 VITALS — BP 124/68 | HR 72 | Temp 98.3°F | Ht 68.0 in | Wt 207.5 lb

## 2023-09-22 DIAGNOSIS — Z7689 Persons encountering health services in other specified circumstances: Secondary | ICD-10-CM

## 2023-09-22 DIAGNOSIS — Z170 Estrogen receptor positive status [ER+]: Secondary | ICD-10-CM

## 2023-09-22 DIAGNOSIS — B359 Dermatophytosis, unspecified: Secondary | ICD-10-CM

## 2023-09-22 DIAGNOSIS — E6609 Other obesity due to excess calories: Secondary | ICD-10-CM | POA: Diagnosis not present

## 2023-09-22 DIAGNOSIS — Z6831 Body mass index (BMI) 31.0-31.9, adult: Secondary | ICD-10-CM

## 2023-09-22 DIAGNOSIS — C50411 Malignant neoplasm of upper-outer quadrant of right female breast: Secondary | ICD-10-CM | POA: Diagnosis not present

## 2023-09-22 DIAGNOSIS — E66811 Obesity, class 1: Secondary | ICD-10-CM

## 2023-09-22 LAB — COMPREHENSIVE METABOLIC PANEL
ALT: 9 U/L (ref 0–35)
AST: 13 U/L (ref 0–37)
Albumin: 4.1 g/dL (ref 3.5–5.2)
Alkaline Phosphatase: 58 U/L (ref 39–117)
BUN: 14 mg/dL (ref 6–23)
CO2: 25 meq/L (ref 19–32)
Calcium: 9.3 mg/dL (ref 8.4–10.5)
Chloride: 108 meq/L (ref 96–112)
Creatinine, Ser: 0.76 mg/dL (ref 0.40–1.20)
GFR: 89.4 mL/min (ref >60.00)
Glucose, Bld: 77 mg/dL (ref 70–99)
Potassium: 4.2 meq/L (ref 3.5–5.1)
Sodium: 140 meq/L (ref 135–145)
Total Bilirubin: 0.5 mg/dL (ref 0.2–1.2)
Total Protein: 7.1 g/dL (ref 6.0–8.3)

## 2023-09-22 LAB — CBC WITH DIFFERENTIAL/PLATELET
Basophils Absolute: 0 10*3/uL (ref 0.0–0.1)
Basophils Relative: 0.9 % (ref 0.0–3.0)
Eosinophils Absolute: 0.1 10*3/uL (ref 0.0–0.7)
Eosinophils Relative: 2.2 % (ref 0.0–5.0)
HCT: 38.5 % (ref 36.0–46.0)
Hemoglobin: 13 g/dL (ref 12.0–15.0)
Lymphocytes Relative: 29.8 % (ref 12.0–46.0)
Lymphs Abs: 1.4 10*3/uL (ref 0.7–4.0)
MCHC: 33.7 g/dL (ref 30.0–36.0)
MCV: 95.8 fL (ref 78.0–100.0)
Monocytes Absolute: 0.3 10*3/uL (ref 0.1–1.0)
Monocytes Relative: 6.8 % (ref 3.0–12.0)
Neutro Abs: 2.9 10*3/uL (ref 1.4–7.7)
Neutrophils Relative %: 60.3 % (ref 43.0–77.0)
Platelets: 235 10*3/uL (ref 150.0–400.0)
RBC: 4.02 Mil/uL (ref 3.87–5.11)
RDW: 12.7 % (ref 11.5–15.5)
WBC: 4.7 10*3/uL (ref 4.0–10.5)

## 2023-09-22 LAB — LIPID PANEL
Cholesterol: 204 mg/dL — ABNORMAL HIGH (ref 0–200)
HDL: 80.7 mg/dL (ref >39.00)
LDL Cholesterol: 110 mg/dL — ABNORMAL HIGH (ref 0–99)
NonHDL: 123.71
Total CHOL/HDL Ratio: 3
Triglycerides: 70 mg/dL (ref 0.0–149.0)
VLDL: 14 mg/dL (ref 0.0–40.0)

## 2023-09-22 LAB — HEMOGLOBIN A1C: Hgb A1c MFr Bld: 5.2 % (ref 4.6–6.5)

## 2023-09-22 LAB — TSH: TSH: 2.43 u[IU]/mL (ref 0.35–5.50)

## 2023-09-22 MED ORDER — CLOTRIMAZOLE-BETAMETHASONE 1-0.05 % EX CREA: 1.0000 | TOPICAL_CREAM | Freq: Two times a day (BID) | CUTANEOUS | 2 refills | Status: DC

## 2023-09-22 NOTE — Patient Instructions (Signed)
Calorie Counting for Weight Loss Calories are units of energy. Your body needs a certain number of calories from food to keep going throughout the day. When you eat or drink more calories than your body needs, your body stores the extra calories mostly as fat. When you eat or drink fewer calories than your body needs, your body burns fat to get the energy it needs. Calorie counting means keeping track of how many calories you eat and drink each day. Calorie counting can be helpful if you need to lose weight. If you eat fewer calories than your body needs, you should lose weight. Ask your health care provider what a healthy weight is for you. For calorie counting to work, you will need to eat the right number of calories each day to lose a healthy amount of weight per week. A dietitian can help you figure out how many calories you need in a day and will suggest ways to reach your calorie goal. A healthy amount of weight to lose each week is usually 1-2 lb (0.5-0.9 kg). This usually means that your daily calorie intake should be reduced by 500-750 calories. Eating 1,200-1,500 calories a day can help most women lose weight. Eating 1,500-1,800 calories a day can help most men lose weight. What do I need to know about calorie counting? Work with your health care provider or dietitian to determine how many calories you should get each day. To meet your daily calorie goal, you will need to: Find out how many calories are in each food that you would like to eat. Try to do this before you eat. Decide how much of the food you plan to eat. Keep a food log. Do this by writing down what you ate and how many calories it had. To successfully lose weight, it is important to balance calorie counting with a healthy lifestyle that includes regular activity. Where do I find calorie information?  The number of calories in a food can be found on a Nutrition Facts label. If a food does not have a Nutrition Facts label, try  to look up the calories online or ask your dietitian for help. Remember that calories are listed per serving. If you choose to have more than one serving of a food, you will have to multiply the calories per serving by the number of servings you plan to eat. For example, the label on a package of bread might say that a serving size is 1 slice and that there are 90 calories in a serving. If you eat 1 slice, you will have eaten 90 calories. If you eat 2 slices, you will have eaten 180 calories. How do I keep a food log? After each time that you eat, record the following in your food log as soon as possible: What you ate. Be sure to include toppings, sauces, and other extras on the food. How much you ate. This can be measured in cups, ounces, or number of items. How many calories were in each food and drink. The total number of calories in the food you ate. Keep your food log near you, such as in a pocket-sized notebook or on an app or website on your mobile phone. Some programs will calculate calories for you and show you how many calories you have left to meet your daily goal. What are some portion-control tips? Know how many calories are in a serving. This will help you know how many servings you can have of a certain   food. Use a measuring cup to measure serving sizes. You could also try weighing out portions on a kitchen scale. With time, you will be able to estimate serving sizes for some foods. Take time to put servings of different foods on your favorite plates or in your favorite bowls and cups so you know what a serving looks like. Try not to eat straight from a food's packaging, such as from a bag or box. Eating straight from the package makes it hard to see how much you are eating and can lead to overeating. Put the amount you would like to eat in a cup or on a plate to make sure you are eating the right portion. Use smaller plates, glasses, and bowls for smaller portions and to prevent  overeating. Try not to multitask. For example, avoid watching TV or using your computer while eating. If it is time to eat, sit down at a table and enjoy your food. This will help you recognize when you are full. It will also help you be more mindful of what and how much you are eating. What are tips for following this plan? Reading food labels Check the calorie count compared with the serving size. The serving size may be smaller than what you are used to eating. Check the source of the calories. Try to choose foods that are high in protein, fiber, and vitamins, and low in saturated fat, trans fat, and sodium. Shopping Read nutrition labels while you shop. This will help you make healthy decisions about which foods to buy. Pay attention to nutrition labels for low-fat or fat-free foods. These foods sometimes have the same number of calories or more calories than the full-fat versions. They also often have added sugar, starch, or salt to make up for flavor that was removed with the fat. Make a grocery list of lower-calorie foods and stick to it. Cooking Try to cook your favorite foods in a healthier way. For example, try baking instead of frying. Use low-fat dairy products. Meal planning Use more fruits and vegetables. One-half of your plate should be fruits and vegetables. Include lean proteins, such as chicken, turkey, and fish. Lifestyle Each week, aim to do one of the following: 150 minutes of moderate exercise, such as walking. 75 minutes of vigorous exercise, such as running. General information Know how many calories are in the foods you eat most often. This will help you calculate calorie counts faster. Find a way of tracking calories that works for you. Get creative. Try different apps or programs if writing down calories does not work for you. What foods should I eat?  Eat nutritious foods. It is better to have a nutritious, high-calorie food, such as an avocado, than a food with  few nutrients, such as a bag of potato chips. Use your calories on foods and drinks that will fill you up and will not leave you hungry soon after eating. Examples of foods that fill you up are nuts and nut butters, vegetables, lean proteins, and high-fiber foods such as whole grains. High-fiber foods are foods with more than 5 g of fiber per serving. Pay attention to calories in drinks. Low-calorie drinks include water and unsweetened drinks. The items listed above may not be a complete list of foods and beverages you can eat. Contact a dietitian for more information. What foods should I limit? Limit foods or drinks that are not good sources of vitamins, minerals, or protein or that are high in unhealthy fats. These   include: Candy. Other sweets. Sodas, specialty coffee drinks, alcohol, and juice. The items listed above may not be a complete list of foods and beverages you should avoid. Contact a dietitian for more information. How do I count calories when eating out? Pay attention to portions. Often, portions are much larger when eating out. Try these tips to keep portions smaller: Consider sharing a meal instead of getting your own. If you get your own meal, eat only half of it. Before you start eating, ask for a container and put half of your meal into it. When available, consider ordering smaller portions from the menu instead of full portions. Pay attention to your food and drink choices. Knowing the way food is cooked and what is included with the meal can help you eat fewer calories. If calories are listed on the menu, choose the lower-calorie options. Choose dishes that include vegetables, fruits, whole grains, low-fat dairy products, and lean proteins. Choose items that are boiled, broiled, grilled, or steamed. Avoid items that are buttered, battered, fried, or served with cream sauce. Items labeled as crispy are usually fried, unless stated otherwise. Choose water, low-fat milk,  unsweetened iced tea, or other drinks without added sugar. If you want an alcoholic beverage, choose a lower-calorie option, such as a glass of wine or light beer. Ask for dressings, sauces, and syrups on the side. These are usually high in calories, so you should limit the amount you eat. If you want a salad, choose a garden salad and ask for grilled meats. Avoid extra toppings such as bacon, cheese, or fried items. Ask for the dressing on the side, or ask for olive oil and vinegar or lemon to use as dressing. Estimate how many servings of a food you are given. Knowing serving sizes will help you be aware of how much food you are eating at restaurants. Where to find more information Centers for Disease Control and Prevention: www.cdc.gov [place] Department [employer]: myplate.gov Summary Calorie counting means keeping track of how many calories you eat and drink each day. If you eat fewer calories than your body needs, you should lose weight. A healthy amount of weight to lose per week is usually 1-2 lb (0.5-0.9 kg). This usually means reducing your daily calorie intake by 500-750 calories. The number of calories in a food can be found on a Nutrition Facts label. If a food does not have a Nutrition Facts label, try to look up the calories online or ask your dietitian for help. Use smaller plates, glasses, and bowls for smaller portions and to prevent overeating. Use your calories on foods and drinks that will fill you up and not leave you hungry shortly after a meal. This information is not intended to replace advice given to you by your health care provider. Make sure you discuss any questions you have with your health care provider. Document Revised: 12/06/2019 Document Reviewed: 12/06/2019 [employer] Patient Education  2023 [employer]  

## 2023-09-22 NOTE — Assessment & Plan Note (Signed)
Of right lower leg.  Recommend Lotrisone twice a day for 14 days

## 2023-09-22 NOTE — Progress Notes (Signed)
Julie Atkinson 52 y.o.   Chief Complaint  Patient presents with   New Patient (Initial Visit)    Patient states she may have a ringworm on her right leg,, patient noticed it about 2 weeks ago.    Insomnia    Patient states she is not sleeping ,     HISTORY OF PRESENT ILLNESS: This is a 52 y.o. female first visit to this office, here to establish care. Has history of breast cancer in remission.  Still taking tamoxifen daily. Concerned about her weight. Complaining of ringworm infection to right lower leg  HPI   Prior to Admission medications   Medication Sig Start Date End Date Taking? Authorizing Provider  clindamycin (CLINDAGEL) 1 % gel Apply topically 2 (two) times daily. As needed 05/27/23  Yes Serena Croissant, MD  diltiazem (CARDIZEM) 30 MG tablet  09/12/23  Yes [provider]  FLUoxetine (PROZAC) 10 MG capsule Take 1 capsule (10 mg total) by mouth daily. 12/31/22  Yes Jerene Bears, MD  tamoxifen (NOLVADEX) 20 MG tablet Take 1 tablet (20 mg total) by mouth daily. 05/23/23  Yes Serena Croissant, MD    Allergies  Allergen Reactions   Iodinated Contrast Media Other (See Comments)    Mother had anaphylactic shock   Latex Other (See Comments)    Could be adhesive Rash on Port site Needs sensitive skin dressing   Codeine Nausea And Vomiting   Tegaderm Ag Mesh [Silver] Other (See Comments)    "Blisters"    Patient Active Problem List   Diagnosis Date Noted   Chronic rhinitis 08/29/2023   Port-A-Cath in place 12/16/2021   Family history of breast cancer 12/04/2021   Family history of ovarian cancer 12/04/2021   Malignant neoplasm of upper-outer quadrant of right female breast (HCC) 12/01/2021   PTSD (post-traumatic stress disorder)     Past Medical History:  Diagnosis Date   Cancer (HCC)    Family history of breast cancer 12/04/2021   Family history of ovarian cancer 12/04/2021   Fibroid 2015   8 mm   History of posttraumatic stress disorder (PTSD) 20-Oct-2008    due to maternal death-MI   History of radiation therapy    Right breast- 07/15/22-08/23/22- Dr. Antony Blackbird   Hyperlipidemia    PTSD (post-traumatic stress disorder)    mother's death Oct 20, 2008; seizure and AMI while driving, patient performed CPR x 45 minutes   Seropositive for herpes simplex 2 infection 2016-10-20    Past Surgical History:  Procedure Laterality Date   BLADDER SURGERY  11/08/1976   nocturnal enuresis treatment   Breast biopsies  October 20, 2022   BREAST LUMPECTOMY WITH RADIOACTIVE SEED AND AXILLARY LYMPH NODE DISSECTION  05/03/2022   CESAREAN SECTION  11/08/2005   Dr Edward Jolly   DILATATION & CURETTAGE/HYSTEROSCOPY WITH MYOSURE N/A 01/06/2016   Procedure: DILATATION & CURETTAGE/HYSTEROSCOPY WITH Jackquline Denmark;  Surgeon: Patton Salles, MD;  Location: [facility];  Service: Gynecology;  Laterality: N/A;   DILATION AND CURETTAGE OF UTERUS  11/08/1988   MAB?   Lumpectomies     X 6   PORT-A-CATH REMOVAL N/A 12/30/2022   Procedure: REMOVAL PORT-A-CATH;  Surgeon: Abigail Miyamoto, MD;  Location: [facility];  Service: General;  Laterality: N/A;   PORTACATH PLACEMENT Left 12/15/2021   Procedure: INSERTION PORT-A-CATH;  Surgeon: Abigail Miyamoto, MD;  Location:  SURGERY CENTER;  Service: General;  Laterality: Left;   Total Breast reduction  06/2022    Social History   Socioeconomic History  Marital status: Single    Spouse name: n/a   Number of children: 1   Years of education: Not on file   Highest education level: Not on file  Occupational History   Occupation: Dispensing optician    Comment: builds online courses  Tobacco Use   Smoking status: Former    Current packs/day: 0.00    Types: Cigarettes    Quit date: 04/06/2014    Years since quitting: 9.4   Smokeless tobacco: Former  Substance and Sexual Activity   Alcohol use: Yes    Alcohol/week: 4.0 standard drinks of alcohol    Types: 4 Glasses of wine per week    Comment: 4 glasses  of wine/week   Drug use: No   Sexual activity: Not Currently    Partners: Male    Birth control/protection: None  Other Topics Concern   Not on file  Social History Narrative   Divorced.   Lives with her son.   Lost her job suddenly 08/20/2016 with job cuts at work.   Social Determinants of Health   Financial Resource Strain: Not on file  Food Insecurity: Low Risk  (09/01/2023)   Received from [facility]   Hunger Vital Sign    Worried About Running Out of Food in the Last Year: Never true    Ran Out of Food in the Last Year: Never true  Transportation Needs: No Transportation Needs (09/01/2023)   Received from [facility]    In the past 12 months, has lack of reliable transportation kept you from medical appointments, meetings, work or from getting things needed for daily living? : No  Physical Activity: Not on file  Stress: Not on file  Social Connections: Not on file  Intimate Partner Violence: Not on file    Family History  Problem Relation Age of Onset   Pulmonary disease Mother    Heart disease Mother    Cervical cancer Mother 103   Breast cancer Maternal Aunt        dx late 73s   Lung cancer Maternal Uncle        d. 38   Bone cancer Paternal Grandmother        primary? limited info; d. 58s   Breast cancer Cousin 14       maternal female cousin   Ovarian cancer Cousin        maternal cousin; d. 44   Stomach cancer Other    Rectal cancer Other    Esophageal cancer Other    Colon polyps Other    Colon cancer Other    Cancer Other        cervical or other GYN cancer in several maternal relatives     Review of Systems  Constitutional: Negative.  Negative for chills and fever.  HENT: Negative.  Negative for congestion and sore throat.   Respiratory: Negative.  Negative for cough and shortness of breath.   Cardiovascular: Negative.  Negative for chest pain and palpitations.  Gastrointestinal:  Negative for abdominal pain, diarrhea,  nausea and vomiting.  Genitourinary: Negative.  Negative for dysuria and hematuria.  Skin: Negative.  Negative for rash.  Neurological: Negative.  Negative for dizziness and headaches.  All other systems reviewed and are negative.   Today's Vitals   09/22/23 0928  BP: 124/68  Pulse: 72  Temp: 98.3 F (36.8 C)  TempSrc: Oral  SpO2: 97%  Weight: 207 lb 8 oz (94.1 kg)  Height: 5\' 8"  (1.727  m)   Body mass index is 31.55 kg/m.   Physical Exam Vitals reviewed.  Constitutional:      Appearance: Normal appearance.  HENT:     Head: Normocephalic.     Mouth/Throat:     Mouth: Mucous membranes are moist.     Pharynx: Oropharynx is clear.  Eyes:     Extraocular Movements: Extraocular movements intact.     Conjunctiva/sclera: Conjunctivae normal.     Pupils: Pupils are equal, round, and reactive to light.  Cardiovascular:     Rate and Rhythm: Normal rate and regular rhythm.     Pulses: Normal pulses.     Heart sounds: Normal heart sounds.  Pulmonary:     Effort: Pulmonary effort is normal.     Breath sounds: Normal breath sounds.  Musculoskeletal:     Cervical back: No tenderness.  Lymphadenopathy:     Cervical: No cervical adenopathy.  Skin:    General: Skin is warm and dry.     Capillary Refill: Capillary refill takes less than 2 seconds.     Comments: Ringworm lesion to right lower leg  Neurological:     General: No focal deficit present.     Mental Status: She is alert and oriented to person, place, and time.  Psychiatric:        Mood and Affect: Mood normal.        Behavior: Behavior normal.      ASSESSMENT & PLAN: A total of 48 minutes was spent with the patient and counseling/coordination of care regarding preparing for this visit, review of available medical records, establishing care with me, comprehensive history and physical examination, review of health maintenance items, education on nutrition, need for blood work today, prognosis, documentation and need  for follow-up.  Problem List Items Addressed This Visit       Musculoskeletal and Integument   Ringworm    Of right lower leg.  Recommend Lotrisone twice a day for 14 days      Relevant Medications   clotrimazole-betamethasone (LOTRISONE) cream     Other   Malignant neoplasm of upper-outer quadrant of right female breast (HCC)    In remission.  Takes tamoxifen 10 mg daily No concerns at present time.      Class 1 obesity due to excess calories without serious comorbidity with body mass index (BMI) of 31.0 to 31.9 in adult - Primary    Diet and nutrition discussed Benefits of exercise discussed Recommend to decrease amount of daily carbohydrate intake and daily calories and increase amount of plant-based protein in her diet.      Relevant Orders   CBC with Differential/Platelet   Comprehensive metabolic panel   Hemoglobin A1c   Lipid panel   TSH   Other Visit Diagnoses     Encounter to establish care          Patient Instructions  Calorie Counting for Weight Loss Calories are units of energy. Your body needs a certain number of calories from food to keep going throughout the day. When you eat or drink more calories than your body needs, your body stores the extra calories mostly as fat. When you eat or drink fewer calories than your body needs, your body burns fat to get the energy it needs. Calorie counting means keeping track of how many calories you eat and drink each day. Calorie counting can be helpful if you need to lose weight. If you eat fewer calories than your body needs, you should lose  weight. Ask your health care provider what a healthy weight is for you. For calorie counting to work, you will need to eat the right number of calories each day to lose a healthy amount of weight per week. A dietitian can help you figure out how many calories you need in a day and will suggest ways to reach your calorie goal. A healthy amount of weight to lose each week is usually  1-2 lb (0.5-0.9 kg). This usually means that your daily calorie intake should be reduced by 500-750 calories. Eating 1,200-1,500 calories a day can help most women lose weight. Eating 1,500-1,800 calories a day can help most men lose weight. What do I need to know about calorie counting? Work with your health care provider or dietitian to determine how many calories you should get each day. To meet your daily calorie goal, you will need to: Find out how many calories are in each food that you would like to eat. Try to do this before you eat. Decide how much of the food you plan to eat. Keep a food log. Do this by writing down what you ate and how many calories it had. To successfully lose weight, it is important to balance calorie counting with a healthy lifestyle that includes regular activity. Where do I find calorie information?  The number of calories in a food can be found on a Nutrition Facts label. If a food does not have a Nutrition Facts label, try to look up the calories online or ask your dietitian for help. Remember that calories are listed per serving. If you choose to have more than one serving of a food, you will have to multiply the calories per serving by the number of servings you plan to eat. For example, the label on a package of bread might say that a serving size is 1 slice and that there are 90 calories in a serving. If you eat 1 slice, you will have eaten 90 calories. If you eat 2 slices, you will have eaten 180 calories. How do I keep a food log? After each time that you eat, record the following in your food log as soon as possible: What you ate. Be sure to include toppings, sauces, and other extras on the food. How much you ate. This can be measured in cups, ounces, or number of items. How many calories were in each food and drink. The total number of calories in the food you ate. Keep your food log near you, such as in a pocket-sized notebook or on an app or website on  your mobile phone. Some programs will calculate calories for you and show you how many calories you have left to meet your daily goal. What are some portion-control tips? Know how many calories are in a serving. This will help you know how many servings you can have of a certain food. Use a measuring cup to measure serving sizes. You could also try weighing out portions on a kitchen scale. With time, you will be able to estimate serving sizes for some foods. Take time to put servings of different foods on your favorite plates or in your favorite bowls and cups so you know what a serving looks like. Try not to eat straight from a food's packaging, such as from a bag or box. Eating straight from the package makes it hard to see how much you are eating and can lead to overeating. Put the amount you would like to eat  in a cup or on a plate to make sure you are eating the right portion. Use smaller plates, glasses, and bowls for smaller portions and to prevent overeating. Try not to multitask. For example, avoid watching TV or using your computer while eating. If it is time to eat, sit down at a table and enjoy your food. This will help you recognize when you are full. It will also help you be more mindful of what and how much you are eating. What are tips for following this plan? Reading food labels Check the calorie count compared with the serving size. The serving size may be smaller than what you are used to eating. Check the source of the calories. Try to choose foods that are high in protein, fiber, and vitamins, and low in saturated fat, trans fat, and sodium. Shopping Read nutrition labels while you shop. This will help you make healthy decisions about which foods to buy. Pay attention to nutrition labels for low-fat or fat-free foods. These foods sometimes have the same number of calories or more calories than the full-fat versions. They also often have added sugar, starch, or salt to make up for  flavor that was removed with the fat. Make a grocery list of lower-calorie foods and stick to it. Cooking Try to cook your favorite foods in a healthier way. For example, try baking instead of frying. Use low-fat dairy products. Meal planning Use more fruits and vegetables. One-half of your plate should be fruits and vegetables. Include lean proteins, such as chicken, [place], and fish. Lifestyle Each week, aim to do one of the following: 150 minutes of moderate exercise, such as walking. 75 minutes of vigorous exercise, such as running. General information Know how many calories are in the foods you eat most often. This will help you calculate calorie counts faster. Find a way of tracking calories that works for you. Get creative. Try different apps or programs if writing down calories does not work for you. What foods should I eat?  Eat nutritious foods. It is better to have a nutritious, high-calorie food, such as an avocado, than a food with few nutrients, such as a bag of potato chips. Use your calories on foods and drinks that will fill you up and will not leave you hungry soon after eating. Examples of foods that fill you up are nuts and nut butters, vegetables, lean proteins, and high-fiber foods such as whole grains. High-fiber foods are foods with more than 5 g of fiber per serving. Pay attention to calories in drinks. Low-calorie drinks include water and unsweetened drinks. The items listed above may not be a complete list of foods and beverages you can eat. Contact a dietitian for more information. What foods should I limit? Limit foods or drinks that are not good sources of vitamins, minerals, or protein or that are high in unhealthy fats. These include: Candy. Other sweets. Sodas, specialty coffee drinks, alcohol, and juice. The items listed above may not be a complete list of foods and beverages you should avoid. Contact a dietitian for more information. How do I count  calories when eating out? Pay attention to portions. Often, portions are much larger when eating out. Try these tips to keep portions smaller: Consider sharing a meal instead of getting your own. If you get your own meal, eat only half of it. Before you start eating, ask for a container and put half of your meal into it. When available, consider ordering smaller portions from  the menu instead of full portions. Pay attention to your food and drink choices. Knowing the way food is cooked and what is included with the meal can help you eat fewer calories. If calories are listed on the menu, choose the lower-calorie options. Choose dishes that include vegetables, fruits, whole grains, low-fat dairy products, and lean proteins. Choose items that are boiled, broiled, grilled, or steamed. Avoid items that are buttered, battered, fried, or served with cream sauce. Items labeled as crispy are usually fried, unless stated otherwise. Choose water, low-fat milk, unsweetened iced tea, or other drinks without added sugar. If you want an alcoholic beverage, choose a lower-calorie option, such as a glass of wine or light beer. Ask for dressings, sauces, and syrups on the side. These are usually high in calories, so you should limit the amount you eat. If you want a salad, choose a garden salad and ask for grilled meats. Avoid extra toppings such as bacon, cheese, or fried items. Ask for the dressing on the side, or ask for olive oil and vinegar or lemon to use as dressing. Estimate how many servings of a food you are given. Knowing serving sizes will help you be aware of how much food you are eating at restaurants. Where to find more information Centers for Disease Control and Prevention: FootballExhibition.com.br U.S. Department of Agriculture: WrestlingReporter.dk Summary Calorie counting means keeping track of how many calories you eat and drink each day. If you eat fewer calories than your body needs, you should lose weight. A  healthy amount of weight to lose per week is usually 1-2 lb (0.5-0.9 kg). This usually means reducing your daily calorie intake by 500-750 calories. The number of calories in a food can be found on a Nutrition Facts label. If a food does not have a Nutrition Facts label, try to look up the calories online or ask your dietitian for help. Use smaller plates, glasses, and bowls for smaller portions and to prevent overeating. Use your calories on foods and drinks that will fill you up and not leave you hungry shortly after a meal. This information is not intended to replace advice given to you by your health care provider. Make sure you discuss any questions you have with your health care provider. Document Revised: 12/06/2019 Document Reviewed: 12/06/2019 Elsevier Patient Education  2023 Elsevier Inc.    Edwina Barth, MD [facility]

## 2023-09-22 NOTE — Assessment & Plan Note (Signed)
Diet and nutrition discussed Benefits of exercise discussed Recommend to decrease amount of daily carbohydrate intake and daily calories and increase amount of plant-based protein in her diet

## 2023-09-22 NOTE — Assessment & Plan Note (Signed)
In remission.  Takes tamoxifen 10 mg daily No concerns at present time.

## 2023-09-23 ENCOUNTER — Ambulatory Visit (INDEPENDENT_AMBULATORY_CARE_PROVIDER_SITE_OTHER): Payer: BC Managed Care – PPO | Admitting: Otolaryngology

## 2023-09-23 ENCOUNTER — Encounter (INDEPENDENT_AMBULATORY_CARE_PROVIDER_SITE_OTHER): Payer: Self-pay

## 2023-09-23 VITALS — Ht 68.0 in | Wt 199.0 lb

## 2023-09-23 DIAGNOSIS — J310 Chronic rhinitis: Secondary | ICD-10-CM

## 2023-09-23 NOTE — Progress Notes (Signed)
Patient ID: Julie Atkinson, female   DOB: 1971/01/10, 52 y.o.   MRN: [id number]  Septum and turbinates are healing well.   Both  debrided.   Nasal saline irrigation.   Recheck in 4 months.

## 2023-10-05 ENCOUNTER — Other Ambulatory Visit: Payer: Self-pay

## 2023-10-05 MED ORDER — DILTIAZEM HCL 30 MG PO TABS: 30.0000 mg | ORAL_TABLET | Freq: Four times a day (QID) | ORAL | 0 refills | Status: DC | PRN

## 2023-11-04 DIAGNOSIS — C50911 Malignant neoplasm of unspecified site of right female breast: Secondary | ICD-10-CM | POA: Diagnosis not present

## 2023-11-04 DIAGNOSIS — C50912 Malignant neoplasm of unspecified site of left female breast: Secondary | ICD-10-CM | POA: Diagnosis not present

## 2023-11-22 ENCOUNTER — Other Ambulatory Visit: Payer: Self-pay | Admitting: *Deleted

## 2023-11-22 DIAGNOSIS — Z170 Estrogen receptor positive status [ER+]: Secondary | ICD-10-CM

## 2023-11-22 NOTE — Progress Notes (Signed)
RN successfully faxed mammogram order to [facility].

## 2023-11-23 DIAGNOSIS — Z853 Personal history of malignant neoplasm of breast: Secondary | ICD-10-CM | POA: Diagnosis not present

## 2023-11-23 DIAGNOSIS — R92323 Mammographic fibroglandular density, bilateral breasts: Secondary | ICD-10-CM | POA: Diagnosis not present

## 2023-11-28 ENCOUNTER — Encounter: Payer: Self-pay | Admitting: Hematology and Oncology

## 2024-01-26 ENCOUNTER — Telehealth (INDEPENDENT_AMBULATORY_CARE_PROVIDER_SITE_OTHER): Payer: Self-pay | Admitting: Otolaryngology

## 2024-01-26 NOTE — Telephone Encounter (Signed)
 Reminder Call:  Date: 01/27/2024 Status: Sch  Time: 9:00 AM Left Voicemail message w/time and location-[id number] [place], [place] [address]

## 2024-01-27 ENCOUNTER — Ambulatory Visit (INDEPENDENT_AMBULATORY_CARE_PROVIDER_SITE_OTHER): Payer: BC Managed Care – PPO

## 2024-03-19 ENCOUNTER — Encounter: Payer: Self-pay | Admitting: Hematology and Oncology

## 2024-03-21 ENCOUNTER — Other Ambulatory Visit: Payer: Self-pay | Admitting: Emergency Medicine

## 2024-03-21 DIAGNOSIS — B359 Dermatophytosis, unspecified: Secondary | ICD-10-CM

## 2024-03-23 ENCOUNTER — Encounter: Payer: Self-pay | Admitting: Emergency Medicine

## 2024-03-26 ENCOUNTER — Other Ambulatory Visit (HOSPITAL_COMMUNITY): Payer: Self-pay

## 2024-03-26 ENCOUNTER — Encounter: Payer: Self-pay | Admitting: Hematology and Oncology

## 2024-03-26 ENCOUNTER — Telehealth: Payer: Self-pay

## 2024-03-26 ENCOUNTER — Other Ambulatory Visit: Payer: Self-pay | Admitting: Emergency Medicine

## 2024-03-26 MED ORDER — TIRZEPATIDE-WEIGHT MANAGEMENT 2.5 MG/0.5ML ~~LOC~~ SOLN: 2.5000 mg | SUBCUTANEOUS | 1 refills | Status: DC

## 2024-03-26 NOTE — Telephone Encounter (Signed)
 New prescription for Zepbound sent to pharmacy requested today.  Thanks.

## 2024-03-26 NOTE — Telephone Encounter (Signed)
 Pharmacy Patient Advocate Encounter   Received notification from CoverMyMeds that prior authorization for Zepbound 2.5MG /0.5ML pen-injectors is required/requested.   Insurance verification completed.   The patient is insured through [facility] .   Per test claim: PA required; PA submitted to above mentioned insurance via CoverMyMeds Key/confirmation #/EOC (Key: [id number])   Status is pending

## 2024-03-26 NOTE — Telephone Encounter (Signed)
 Pharmacy Patient Advocate Encounter  Received notification from [facility] that Prior Authorization for  Zepbound 2.5MG /0.5ML pen-injectors has been APPROVED from 03-26-2024 to 09-22-2024   PA #/Case ID/Reference #: [id number]

## 2024-05-24 ENCOUNTER — Encounter: Payer: Self-pay | Admitting: Hematology and Oncology

## 2024-05-25 ENCOUNTER — Other Ambulatory Visit: Payer: Self-pay

## 2024-05-28 ENCOUNTER — Inpatient Hospital Stay: Payer: BC Managed Care – PPO | Admitting: Hematology and Oncology

## 2024-06-25 ENCOUNTER — Encounter: Payer: Self-pay | Admitting: Hematology and Oncology

## 2024-06-29 ENCOUNTER — Other Ambulatory Visit: Payer: Self-pay | Admitting: Hematology and Oncology

## 2024-06-29 NOTE — Telephone Encounter (Signed)
 Per OV, continue for 5-10 years and started in 2023. Andrea CHRISTELLA Plunk, RN

## 2024-07-02 ENCOUNTER — Inpatient Hospital Stay: Attending: Hematology and Oncology | Admitting: Hematology and Oncology

## 2024-07-02 VITALS — BP 138/82 | HR 67 | Temp 98.6°F | Resp 18 | Wt 225.9 lb

## 2024-07-02 DIAGNOSIS — Z79899 Other long term (current) drug therapy: Secondary | ICD-10-CM | POA: Insufficient documentation

## 2024-07-02 DIAGNOSIS — Z923 Personal history of irradiation: Secondary | ICD-10-CM | POA: Insufficient documentation

## 2024-07-02 DIAGNOSIS — Z9221 Personal history of antineoplastic chemotherapy: Secondary | ICD-10-CM | POA: Insufficient documentation

## 2024-07-02 DIAGNOSIS — Z170 Estrogen receptor positive status [ER+]: Secondary | ICD-10-CM | POA: Insufficient documentation

## 2024-07-02 DIAGNOSIS — Z1721 Progesterone receptor positive status: Secondary | ICD-10-CM | POA: Insufficient documentation

## 2024-07-02 DIAGNOSIS — Z79810 Long term (current) use of selective estrogen receptor modulators (SERMs): Secondary | ICD-10-CM | POA: Diagnosis not present

## 2024-07-02 DIAGNOSIS — C50411 Malignant neoplasm of upper-outer quadrant of right female breast: Secondary | ICD-10-CM | POA: Insufficient documentation

## 2024-07-02 DIAGNOSIS — Z1731 Human epidermal growth factor receptor 2 positive status: Secondary | ICD-10-CM | POA: Diagnosis not present

## 2024-07-02 NOTE — Progress Notes (Signed)
 Patient Care Team: Purcell Emil Schanz, MD as PCP - General (Internal Medicine) Alvan Ronal BRAVO, MD (Inactive) as PCP - Cardiology (Cardiology) Vernetta Berg, MD as Consulting Physician (General Surgery) Odean Potts, MD as Consulting Physician (Hematology and Oncology) Shannon Agent, MD as Consulting Physician (Radiation Oncology)  DIAGNOSIS:  Encounter Diagnosis  Name Primary?   Malignant neoplasm of upper-outer quadrant of right breast in female, estrogen receptor positive (HCC) Yes    SUMMARY OF ONCOLOGIC HISTORY: Oncology History  Malignant neoplasm of upper-outer quadrant of right female breast (HCC)  11/19/2021 Initial Diagnosis   Palpable lump for 3 weeks, diagnostic mammogram: 2.2 cm x 2.2 cm irregular mass with a spiculated margin in the upper outer right breast. 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 HER2 positive, Ki-67 15%   12/02/2021 Cancer Staging   Staging form: Breast, AJCC 8th Edition - Clinical stage from 12/02/2021: Stage IB (cT2, cN0, cM0, G2, ER+, PR+, HER2+) - Signed by Odean Potts, MD on 12/02/2021 Stage prefix: Initial diagnosis Histologic grading system: 3 grade system   12/16/2021 - 05/12/2022 Chemotherapy   Patient is on Treatment Plan : BREAST  Docetaxel  + Carboplatin  + Trastuzumab  + Pertuzumab   (TCHP) q21d      01/22/2022 Genetic Testing   Negative hereditary cancer genetic testing: no pathogenic variants detected in [facility] CancerNext +RNAinsight Panel.  Report date is 01/22/2022.   The CancerNext gene panel offered by [employer] includes sequencing, rearrangement analysis, and RNA analysis for the following 36 genes:   APC, ATM, AXIN2, BARD1, BMPR1A, BRCA1, BRCA2, BRIP1, CDH1, CDK4, CDKN2A, CHEK2, DICER1, HOXB13, EPCAM, GREM1, MLH1, MSH2, MSH3, MSH6, MUTYH, NBN, NF1, NTHL1, PALB2, PMS2, POLD1, POLE, PTEN, RAD51C, RAD51D, RECQL, SMAD4, SMARCA4, STK11, and TP53.    04/22/2022 -  Chemotherapy   Patient is on Treatment Plan : BREAST  MAINTENANCE Trastuzumab  IV (6) or SQ (600) D1 q21d X 11 Cycles     05/05/2022 Surgery   Rt retroareolar lumpectomy and Rt UOQ lumpectomy: No residual cancer, Lt Lumpectomy: Benign Complete Pathological Response   06/04/2022 - 07/02/2022 Chemotherapy   Patient is on Treatment Plan : BREAST Trastuzumab   + Pertuzumab  q21d x 13 cycles     07/16/2022 - 08/23/2022 Radiation Therapy   50.4 Gy in 28 treatments to Right Breast   09/2022 -  Anti-estrogen oral therapy   Tamoxifen  x 5-10 years     CHIEF COMPLIANT: Surveillance of breast cancer on tamoxifen  therapy  HISTORY OF PRESENT ILLNESS: Julie Atkinson is a 53 year old with above-mentioned history of breast cancer who is currently on antiestrogen therapy with tamoxifen  since normal 2023.  Overall she has been tolerating tamoxifen  fairly well.  She tells me that she had spent about a month in [place] in [place] with her son and had a wonderful time.  Her son just joined college at [employer].  She denies any lumps or nodules in the breast.  ALLERGIES:  is allergic to iodinated contrast media, latex, codeine, and tegaderm ag mesh [silver ].  MEDICATIONS:  Current Outpatient Medications  Medication Sig Dispense Refill   FLUoxetine  (PROZAC ) 10 MG capsule Take 1 capsule (10 mg total) by mouth daily. 90 capsule 1   tamoxifen  (NOLVADEX ) 20 MG tablet TAKE ONE TABLET BY MOUTH EVERY DAY 90 tablet 3   tirzepatide  (ZEPBOUND ) 2.5 MG/0.5ML injection vial Inject 2.5 mg into the skin once a week. (Patient not taking: Reported on 07/02/2024) 2 mL 1   clindamycin  (CLINDAGEL) 1 % gel Apply topically 2 (two) times daily. As  needed (Patient not taking: Reported on 07/02/2024) 30 g 6   diltiazem  (CARDIZEM ) 30 MG tablet Take 1 tablet (30 mg total) by mouth every 6 (six) hours as needed. (Patient not taking: Reported on 07/02/2024) 120 tablet 0   No current facility-administered medications for this visit.    PHYSICAL EXAMINATION: ECOG PERFORMANCE STATUS: 1 - Symptomatic  but completely ambulatory  Vitals:   07/02/24 1400  BP: 138/82  Pulse: 67  Resp: 18  Temp: 98.6 F (37 C)  SpO2: 97%   Filed Weights   07/02/24 1400  Weight: 225 lb 14.4 oz (102.5 kg)      LABORATORY DATA:  I have reviewed the data as listed    Latest Ref Rng & Units 09/22/2023   10:11 AM 11/25/2022    9:23 AM 11/03/2022    2:04 PM  CMP  Glucose 70 - 99 mg/dL 77  78  870   BUN 6 - 23 mg/dL 14  12  13    Creatinine 0.40 - 1.20 mg/dL 9.23  9.28  9.37   Sodium 135 - 145 mEq/L 140  140  139   Potassium 3.5 - 5.1 mEq/L 4.2  4.1  3.5   Chloride 96 - 112 mEq/L 108  108  110   CO2 19 - 32 mEq/L 25  24  22    Calcium 8.4 - 10.5 mg/dL 9.3  9.0  9.0   Total Protein 6.0 - 8.3 g/dL 7.1  6.7  7.0   Total Bilirubin 0.2 - 1.2 mg/dL 0.5  0.3  0.6   Alkaline Phos 39 - 117 U/L 58  63  57   AST 0 - 37 U/L 13  15  17    ALT 0 - 35 U/L 9  12  14      Lab Results  Component Value Date   WBC 4.7 09/22/2023   HGB 13.0 09/22/2023   HCT 38.5 09/22/2023   MCV 95.8 09/22/2023   PLT 235.0 09/22/2023   NEUTROABS 2.9 09/22/2023    ASSESSMENT & PLAN:  Malignant neoplasm of upper-outer quadrant of right female breast (HCC) 11/19/2021:Palpable lump for 3 weeks, diagnostic mammogram: 2.2 cm x 2.2 cm irregular mass with a spiculated margin in the upper outer right breast. 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 HER2 positive, Ki-67 15% MRI Breast 12/09/21:  Right Breast retroareolar mass 6.7 cm (involves nipple areolar complex), Rt axilla 2.1 cm node, Left breast: Several scattered foci left breast indeterminate 0.6 cm mass LIQ left breast and another indeterminate 0.6 cm mass in upper central Left breast   Treatment Plan: 1. Neoadjuvant chemotherapy with TCHP foll by Herceptin  maintenance completed 11/25/22  2. 05/05/22: Bilateral lumpectomies: Path CR 3. Adjuvant radiation therapy 07/16/2022-08/23/2022 4.  Adjuvant antiestrogen therapy with tamoxifen  started  10/04/2022 ------------------------------------------------------------------------------------------------------------------------------------ Current Treatment: Tamoxifen    Tamoxifen  toxicities:  Joint pains: Doing better on tamoxifen  to 10 mg daily   Breast cancer surveillance: Mammogram 11/23/2023 at [facility]: Benign, density category B Breast Exam: 07/02/2024: scar tissue, benign    Discussed with her the importance of exercise and making lifestyle changes to lose weight. She went to [place] in [place] (she brings me several photos of her trips every year which have been wonderful to see her enjoying quality time with her son) Return to clinic in 1 year for follow-up    No orders of the defined types were placed in this encounter.  The patient has a good understanding of the overall plan. she agrees with it.  she will call with any problems that may develop before the next visit here. Total time spent: 30 mins including face to face time and time spent for planning, charting and co-ordination of care   Naomi MARLA Chad, MD 07/02/24

## 2024-07-02 NOTE — Assessment & Plan Note (Signed)
 11/19/2021:Palpable lump for 3 weeks, diagnostic mammogram: 2.2 cm x 2.2 cm irregular mass with a spiculated margin in the upper outer right breast. 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 HER2 positive, Ki-67 15% MRI Breast 12/09/21:  Right Breast retroareolar mass 6.7 cm (involves nipple areolar complex), Rt axilla 2.1 cm node, Left breast: Several scattered foci left breast indeterminate 0.6 cm mass LIQ left breast and another indeterminate 0.6 cm mass in upper central Left breast   Treatment Plan: 1. Neoadjuvant chemotherapy with TCHP foll by Herceptin  maintenance completed 11/25/22  2. 05/05/22: Bilateral lumpectomies: Path CR 3. Adjuvant radiation therapy 07/16/2022-08/23/2022 4.  Adjuvant antiestrogen therapy with tamoxifen  started 10/04/2022 ------------------------------------------------------------------------------------------------------------------------------------ Current Treatment: Tamoxifen    Tamoxifen  toxicities:  Joint pains: advised reducing dose of Tamoxifen  to 10 mg daily   Breast cancer surveillance: Mammogram 11/23/2023 at [facility]: Benign, density category B Breast Exam: 07/02/2024: scar tissue, benign    Discussed with her the importance of exercise and making lifestyle changes to lose weight. She went to [place] and [employer] (made an album and gifted me that) Return to clinic in 1 year for follow-up

## 2024-07-03 ENCOUNTER — Encounter: Payer: Self-pay | Admitting: Hematology and Oncology

## 2024-07-03 ENCOUNTER — Telehealth: Payer: Self-pay

## 2024-07-03 ENCOUNTER — Other Ambulatory Visit (HOSPITAL_COMMUNITY): Payer: Self-pay

## 2024-07-03 NOTE — Telephone Encounter (Signed)
 Pharmacy Patient Advocate Encounter   Received notification from Onbase that prior authorization for Zepbound  2.5MG /0.5ML pen-injectors  is required/requested.   Insurance verification completed.   The patient is insured through [facility] MEDICAID .   Per test claim:  PA required; However, NEW/RECENT labs/notes are needed to complete & submit PA request.   KEY: [facility]

## 2024-07-04 ENCOUNTER — Other Ambulatory Visit: Payer: Self-pay

## 2024-07-09 ENCOUNTER — Encounter: Payer: Self-pay | Admitting: Hematology and Oncology

## 2024-07-17 ENCOUNTER — Encounter: Payer: Self-pay | Admitting: Hematology and Oncology

## 2024-07-18 ENCOUNTER — Encounter: Payer: Self-pay | Admitting: Hematology and Oncology

## 2024-07-19 ENCOUNTER — Ambulatory Visit: Admitting: Emergency Medicine

## 2024-07-24 ENCOUNTER — Other Ambulatory Visit (HOSPITAL_COMMUNITY): Payer: Self-pay

## 2024-07-24 ENCOUNTER — Encounter: Payer: Self-pay | Admitting: Emergency Medicine

## 2024-07-24 ENCOUNTER — Encounter: Payer: Self-pay | Admitting: Hematology and Oncology

## 2024-07-24 ENCOUNTER — Ambulatory Visit: Payer: Self-pay | Admitting: Emergency Medicine

## 2024-07-24 ENCOUNTER — Ambulatory Visit: Admitting: Emergency Medicine

## 2024-07-24 VITALS — BP 128/86 | HR 75 | Temp 98.6°F | Ht 68.0 in | Wt 225.0 lb

## 2024-07-24 DIAGNOSIS — E66811 Obesity, class 1: Secondary | ICD-10-CM | POA: Diagnosis not present

## 2024-07-24 DIAGNOSIS — Z130 Encounter for screening for diseases of the blood and blood-forming organs and certain disorders involving the immune mechanism: Secondary | ICD-10-CM | POA: Diagnosis not present

## 2024-07-24 DIAGNOSIS — Z1329 Encounter for screening for other suspected endocrine disorder: Secondary | ICD-10-CM

## 2024-07-24 DIAGNOSIS — Z0000 Encounter for general adult medical examination without abnormal findings: Secondary | ICD-10-CM | POA: Diagnosis not present

## 2024-07-24 DIAGNOSIS — Z6831 Body mass index (BMI) 31.0-31.9, adult: Secondary | ICD-10-CM

## 2024-07-24 DIAGNOSIS — C50411 Malignant neoplasm of upper-outer quadrant of right female breast: Secondary | ICD-10-CM

## 2024-07-24 DIAGNOSIS — Z0001 Encounter for general adult medical examination with abnormal findings: Secondary | ICD-10-CM

## 2024-07-24 DIAGNOSIS — Z131 Encounter for screening for diabetes mellitus: Secondary | ICD-10-CM

## 2024-07-24 DIAGNOSIS — Z13220 Encounter for screening for lipoid disorders: Secondary | ICD-10-CM | POA: Diagnosis not present

## 2024-07-24 DIAGNOSIS — E6609 Other obesity due to excess calories: Secondary | ICD-10-CM | POA: Diagnosis not present

## 2024-07-24 LAB — LIPID PANEL
Cholesterol: 211 mg/dL — ABNORMAL HIGH (ref 0–200)
HDL: 95.3 mg/dL (ref >39.00)
LDL Cholesterol: 97 mg/dL (ref 0–99)
NonHDL: 115.66
Total CHOL/HDL Ratio: 2
Triglycerides: 93 mg/dL (ref 0.0–149.0)
VLDL: 18.6 mg/dL (ref 0.0–40.0)

## 2024-07-24 LAB — CBC WITH DIFFERENTIAL/PLATELET
Basophils Absolute: 0 K/uL (ref 0.0–0.1)
Basophils Relative: 0.6 % (ref 0.0–3.0)
Eosinophils Absolute: 0.1 K/uL (ref 0.0–0.7)
Eosinophils Relative: 1.2 % (ref 0.0–5.0)
HCT: 40.5 % (ref 36.0–46.0)
Hemoglobin: 13.5 g/dL (ref 12.0–15.0)
Lymphocytes Relative: 30.3 % (ref 12.0–46.0)
Lymphs Abs: 2 K/uL (ref 0.7–4.0)
MCHC: 33.4 g/dL (ref 30.0–36.0)
MCV: 94.8 fl (ref 78.0–100.0)
Monocytes Absolute: 0.4 K/uL (ref 0.1–1.0)
Monocytes Relative: 5.8 % (ref 3.0–12.0)
Neutro Abs: 4.1 K/uL (ref 1.4–7.7)
Neutrophils Relative %: 62.1 % (ref 43.0–77.0)
Platelets: 182 K/uL (ref 150.0–400.0)
RBC: 4.27 Mil/uL (ref 3.87–5.11)
RDW: 13.1 % (ref 11.5–15.5)
WBC: 6.6 K/uL (ref 4.0–10.5)

## 2024-07-24 LAB — HEMOGLOBIN A1C: Hgb A1c MFr Bld: 5.5 % (ref 4.6–6.5)

## 2024-07-24 LAB — VITAMIN B12: Vitamin B-12: 272 pg/mL (ref 211–911)

## 2024-07-24 LAB — VITAMIN D 25 HYDROXY (VIT D DEFICIENCY, FRACTURES): VITD: 17.55 ng/mL — ABNORMAL LOW (ref 30.00–100.00)

## 2024-07-24 LAB — TSH: TSH: 1.49 u[IU]/mL (ref 0.35–5.50)

## 2024-07-24 MED ORDER — ZEPBOUND 2.5 MG/0.5ML ~~LOC~~ SOAJ
2.5000 mg | SUBCUTANEOUS | 3 refills | Status: AC
Start: 2024-07-24 — End: ?

## 2024-07-24 NOTE — Patient Instructions (Signed)

## 2024-07-24 NOTE — Progress Notes (Signed)
 Doyal LITTIE Ned 53 y.o.   Chief Complaint  Patient presents with   Annual Exam    Patient here for physical. Patient wanting a PA started on zepbound .     HISTORY OF PRESENT ILLNESS: This is a 53 y.o. female here for annual exam Concerned about her weight.  Wants to start GLP-1 agonists No other complaints or medical concerns today. Wt Readings from Last 3 Encounters:  07/02/24 225 lb 14.4 oz (102.5 kg)  09/23/23 199 lb (90.3 kg)  09/22/23 207 lb 8 oz (94.1 kg)     HPI   Prior to Admission medications   Medication Sig Start Date End Date Taking? Authorizing Provider  FLUoxetine  (PROZAC ) 10 MG capsule Take 1 capsule (10 mg total) by mouth daily. 12/31/22  Yes Cleotilde Ronal RAMAN, MD  tamoxifen  (NOLVADEX ) 20 MG tablet TAKE ONE TABLET BY MOUTH EVERY DAY 06/29/24  Yes Gudena, Vinay, MD  tirzepatide  (ZEPBOUND ) 2.5 MG/0.5ML injection vial Inject 2.5 mg into the skin once a week. Patient not taking: Reported on 07/02/2024 03/26/24   Janise Gora Jose, MD  clindamycin  (CLINDAGEL) 1 % gel Apply topically 2 (two) times daily. As needed Patient not taking: Reported on 07/02/2024 05/27/23   Odean Potts, MD    Allergies  Allergen Reactions   Iodinated Contrast Media Other (See Comments)    Mother had anaphylactic shock   Latex Other (See Comments)    Could be adhesive Rash on Port site Needs sensitive skin dressing   Codeine Nausea And Vomiting   Tegaderm Ag Mesh [Silver ] Other (See Comments)    Blisters    Patient Active Problem List   Diagnosis Date Noted   Class 1 obesity due to excess calories without serious comorbidity with body mass index (BMI) of 31.0 to 31.9 in adult 09/22/2023   Chronic rhinitis 08/29/2023   Port-A-Cath in place 12/16/2021   Family history of breast cancer 12/04/2021   Family history of ovarian cancer 12/04/2021   Malignant neoplasm of upper-outer quadrant of right female breast (HCC) 12/01/2021   PTSD (post-traumatic stress disorder)     Past  Medical History:  Diagnosis Date   Cancer (HCC)    Family history of breast cancer 12/04/2021   Family history of ovarian cancer 12/04/2021   Fibroid 2015   8 mm   History of posttraumatic stress disorder (PTSD) 2008/08/23   due to maternal death-MI   History of radiation therapy    Right breast- 07/15/22-08/23/22- Dr. Lynwood Nasuti   Hyperlipidemia    PTSD (post-traumatic stress disorder)    mother's death Aug 23, 2008; seizure and AMI while driving, patient performed CPR x 45 minutes   Seropositive for herpes simplex 2 infection 08/23/16    Past Surgical History:  Procedure Laterality Date   BLADDER SURGERY  11/08/1976   nocturnal enuresis treatment   Breast biopsies  08-23-22   BREAST LUMPECTOMY WITH RADIOACTIVE SEED AND AXILLARY LYMPH NODE DISSECTION  05/03/2022   CESAREAN SECTION  11/08/2005   Dr Nikki   DILATATION & CURETTAGE/HYSTEROSCOPY WITH MYOSURE N/A 01/06/2016   Procedure: DILATATION & CURETTAGE/HYSTEROSCOPY WITH MELINDA;  Surgeon: Bobie FORBES Cathlyn JAYSON Nikki, MD;  Location: [facility];  Service: Gynecology;  Laterality: N/A;   DILATION AND CURETTAGE OF UTERUS  11/08/1988   MAB?   Lumpectomies     X 6   PORT-A-CATH REMOVAL N/A 12/30/2022   Procedure: REMOVAL PORT-A-CATH;  Surgeon: Vernetta Berg, MD;  Location: [facility];  Service: General;  Laterality: N/A;   PORTACATH PLACEMENT  Left 12/15/2021   Procedure: INSERTION PORT-A-CATH;  Surgeon: Vernetta Berg, MD;  Location: [facility];  Service: General;  Laterality: Left;   Total Breast reduction  06/2022    Social History   Socioeconomic History   Marital status: Single    Spouse name: n/a   Number of children: 1   Years of education: Not on file   Highest education level: Not on file  Occupational History   Occupation: Dispensing optician    Comment: builds online courses  Tobacco Use   Smoking status: Former    Current packs/day: 0.00    Types: Cigarettes    Quit date:  04/06/2014    Years since quitting: 10.3   Smokeless tobacco: Former  Substance and Sexual Activity   Alcohol use: Yes    Alcohol/week: 4.0 standard drinks of alcohol    Types: 4 Glasses of wine per week    Comment: 4 glasses of wine/week   Drug use: No   Sexual activity: Not Currently    Partners: Male    Birth control/protection: None  Other Topics Concern   Not on file  Social History Narrative   Divorced.   Lives with her son.   Lost her job suddenly 08/20/2016 with job cuts at work.   Social Drivers of Corporate investment banker Strain: Not on file  Food Insecurity: Low Risk  (09/01/2023)   Received from [facility]   Hunger Vital Sign    Within the past 12 months, you worried that your food would run out before you got money to buy more: Never true    Within the past 12 months, the food you bought just didn't last and you didn't have money to get more. : Never true  Transportation Needs: No Transportation Needs (09/01/2023)   Received from [facility]    In the past 12 months, has lack of reliable transportation kept you from medical appointments, meetings, work or from getting things needed for daily living? : No  Physical Activity: Not on file  Stress: Not on file  Social Connections: Not on file  Intimate Partner Violence: Not on file    Family History  Problem Relation Age of Onset   Pulmonary disease Mother    Heart disease Mother    Cervical cancer Mother 40   Breast cancer Maternal Aunt        dx late 12s   Lung cancer Maternal Uncle        d. 40   Bone cancer Paternal Grandmother        primary? limited info; d. 52s   Breast cancer Cousin 67       maternal female cousin   Ovarian cancer Cousin        maternal cousin; d. 67   Stomach cancer Other    Rectal cancer Other    Esophageal cancer Other    Colon polyps Other    Colon cancer Other    Cancer Other        cervical or other GYN cancer in several maternal relatives      Review of Systems  Constitutional: Negative.  Negative for chills and fever.  HENT: Negative.  Negative for congestion and sore throat.   Respiratory: Negative.  Negative for cough and shortness of breath.   Cardiovascular: Negative.  Negative for chest pain and palpitations.  Gastrointestinal:  Negative for abdominal pain, diarrhea, nausea and vomiting.  Genitourinary: Negative.  Negative for dysuria  and hematuria.  Skin: Negative.  Negative for rash.  Neurological: Negative.  Negative for dizziness and headaches.  All other systems reviewed and are negative.   Today's Vitals   07/24/24 1301  BP: 128/86  Pulse: 75  Temp: 98.6 F (37 C)  TempSrc: Oral  SpO2: 97%  Weight: 225 lb (102.1 kg)  Height: 5' 8 (1.727 m)   Body mass index is 34.21 kg/m.   Physical Exam Vitals reviewed.  Constitutional:      Appearance: Normal appearance.  HENT:     Head: Normocephalic.  Eyes:     Extraocular Movements: Extraocular movements intact.     Pupils: Pupils are equal, round, and reactive to light.  Cardiovascular:     Rate and Rhythm: Normal rate and regular rhythm.     Pulses: Normal pulses.     Heart sounds: Normal heart sounds.  Pulmonary:     Effort: Pulmonary effort is normal.     Breath sounds: Normal breath sounds.  Abdominal:     Palpations: Abdomen is soft.     Tenderness: There is no abdominal tenderness.  Musculoskeletal:     Cervical back: No tenderness.     Right lower leg: No edema.     Left lower leg: No edema.  Lymphadenopathy:     Cervical: No cervical adenopathy.  Skin:    General: Skin is warm and dry.     Capillary Refill: Capillary refill takes less than 2 seconds.  Neurological:     Mental Status: She is alert and oriented to person, place, and time.  Psychiatric:        Mood and Affect: Mood normal.        Behavior: Behavior normal.      ASSESSMENT & PLAN: Problem List Items Addressed This Visit       Other   Malignant neoplasm of  upper-outer quadrant of right female breast (HCC)   Stable.  Follows up with oncologist on a regular basis Continues tamoxifen  20 mg daily      Class 1 obesity due to excess calories without serious comorbidity with body mass index (BMI) of 31.0 to 31.9 in adult   Diet and nutrition discussed Benefits of exercise discussed Recommend to decrease amount of daily carbohydrate intake and daily calories and increase amount of plant-based protein in her diet Will benefit from GLP-1 agonist. Recommend to start weekly Zepbound       Relevant Medications   tirzepatide  (ZEPBOUND ) 2.5 MG/0.5ML Pen   Other Visit Diagnoses       Encounter for general adult medical examination with abnormal findings    -  Primary   Relevant Orders   CBC with Differential/Platelet   Comprehensive metabolic panel with GFR   Hemoglobin A1c   Lipid panel   TSH   Vitamin B12   VITAMIN D  25 Hydroxy (Vit-D Deficiency, Fractures)     Screening for deficiency anemia       Relevant Orders   CBC with Differential/Platelet     Screening for lipoid disorders       Relevant Orders   Lipid panel     Screening for endocrine, metabolic and immunity disorder       Relevant Orders   Comprehensive metabolic panel with GFR   Hemoglobin A1c   TSH   Vitamin B12   VITAMIN D  25 Hydroxy (Vit-D Deficiency, Fractures)      Modifiable risk factors discussed with patient. Anticipatory guidance according to age provided. The following topics were also  discussed: Social Determinants of Health Smoking.  Non-smoker Diet and nutrition Benefits of exercise Cancer screening and review of most recent colonoscopy report from 2024 Vaccinations review and recommendations Cardiovascular risk assessment The 10-year ASCVD risk score (Arnett DK, et al., 2019) is: 1.3%   Values used to calculate the score:     Age: 89 years     Clincally relevant sex: Female     Is Non-Hispanic African American: No     Diabetic: No     Tobacco smoker:  No     Systolic Blood Pressure: 128 mmHg     Is BP treated: No     HDL Cholesterol: 80.7 mg/dL     Total Cholesterol: 204 mg/dL  Mental health including depression and anxiety Fall and accident prevention  Patient Instructions  Health Maintenance, Female Adopting a healthy lifestyle and getting preventive care are important in promoting health and wellness. Ask your health care provider about: The right schedule for you to have regular tests and exams. Things you can do on your own to prevent diseases and keep yourself healthy. What should I know about diet, weight, and exercise? Eat a healthy diet  Eat a diet that includes plenty of vegetables, fruits, low-fat dairy products, and lean protein. Do not eat a lot of foods that are high in solid fats, added sugars, or sodium. Maintain a healthy weight Body mass index (BMI) is used to identify weight problems. It estimates body fat based on height and weight. Your health care provider can help determine your BMI and help you achieve or maintain a healthy weight. Get regular exercise Get regular exercise. This is one of the most important things you can do for your health. Most adults should: Exercise for at least 150 minutes each week. The exercise should increase your heart rate and make you sweat (moderate-intensity exercise). Do strengthening exercises at least twice a week. This is in addition to the moderate-intensity exercise. Spend less time sitting. Even light physical activity can be beneficial. Watch cholesterol and blood lipids Have your blood tested for lipids and cholesterol at 54 years of age, then have this test every 5 years. Have your cholesterol levels checked more often if: Your lipid or cholesterol levels are high. You are older than 54 years of age. You are at high risk for heart disease. What should I know about cancer screening? Depending on your health history and family history, you may need to have cancer  screening at various ages. This may include screening for: Breast cancer. Cervical cancer. Colorectal cancer. Skin cancer. Lung cancer. What should I know about heart disease, diabetes, and high blood pressure? Blood pressure and heart disease High blood pressure causes heart disease and increases the risk of stroke. This is more likely to develop in people who have high blood pressure readings or are overweight. Have your blood pressure checked: Every 3-5 years if you are 62-75 years of age. Every year if you are 65 years old or older. Diabetes Have regular diabetes screenings. This checks your fasting blood sugar level. Have the screening done: Once every three years after age 79 if you are at a normal weight and have a low risk for diabetes. More often and at a younger age if you are overweight or have a high risk for diabetes. What should I know about preventing infection? Hepatitis B If you have a higher risk for hepatitis B, you should be screened for this virus. Talk with your health care provider to  find out if you are at risk for hepatitis B infection. Hepatitis C Testing is recommended for: Everyone born from [birth year] through 1966. Anyone with known risk factors for hepatitis C. Sexually transmitted infections (STIs) Get screened for STIs, including gonorrhea and chlamydia, if: You are sexually active and are younger than 54 years of age. You are older than 54 years of age and your health care provider tells you that you are at risk for this type of infection. Your sexual activity has changed since you were last screened, and you are at increased risk for chlamydia or gonorrhea. Ask your health care provider if you are at risk. Ask your health care provider about whether you are at high risk for HIV. Your health care provider may recommend a prescription medicine to help prevent HIV infection. If you choose to take medicine to prevent HIV, you should first get tested for HIV. You  should then be tested every 3 months for as long as you are taking the medicine. Pregnancy If you are about to stop having your period (premenopausal) and you may become pregnant, seek counseling before you get pregnant. Take 400 to 800 micrograms (mcg) of folic acid every day if you become pregnant. Ask for birth control (contraception) if you want to prevent pregnancy. Osteoporosis and menopause Osteoporosis is a disease in which the bones lose minerals and strength with aging. This can result in bone fractures. If you are 85 years old or older, or if you are at risk for osteoporosis and fractures, ask your health care provider if you should: Be screened for bone loss. Take a calcium or vitamin D  supplement to lower your risk of fractures. Be given hormone replacement therapy (HRT) to treat symptoms of menopause. Follow these instructions at home: Alcohol use Do not drink alcohol if: Your health care provider tells you not to drink. You are pregnant, may be pregnant, or are planning to become pregnant. If you drink alcohol: Limit how much you have to: 0-1 drink a day. Know how much alcohol is in your drink. In the [place] one drink equals one 12 oz bottle of beer (355 mL), one 5 oz glass of wine (148 mL), or one 1 oz glass of hard liquor (44 mL). Lifestyle Do not use any products that contain nicotine or tobacco. These products include cigarettes, chewing tobacco, and vaping devices, such as e-cigarettes. If you need help quitting, ask your health care provider. Do not use street drugs. Do not share needles. Ask your health care provider for help if you need support or information about quitting drugs. General instructions Schedule regular health, dental, and eye exams. Stay current with your vaccines. Tell your health care provider if: You often feel depressed. You have ever been abused or do not feel safe at home. Summary Adopting a healthy lifestyle and getting preventive care are  important in promoting health and wellness. Follow your health care provider's instructions about healthy diet, exercising, and getting tested or screened for diseases. Follow your health care provider's instructions on monitoring your cholesterol and blood pressure. This information is not intended to replace advice given to you by your health care provider. Make sure you discuss any questions you have with your health care provider. Document Revised: 03/16/2021 Document Reviewed: 03/16/2021 Elsevier Patient Education  2024 Elsevier Inc.     Emil Schaumann, MD [facility]

## 2024-07-24 NOTE — Assessment & Plan Note (Signed)
 Diet and nutrition discussed Benefits of exercise discussed Recommend to decrease amount of daily carbohydrate intake and daily calories and increase amount of plant-based protein in her diet Will benefit from GLP-1 agonist. Recommend to start weekly Zepbound 

## 2024-07-24 NOTE — Assessment & Plan Note (Signed)
 Stable.  Follows up with oncologist on a regular basis Continues tamoxifen  20 mg daily

## 2024-07-25 ENCOUNTER — Telehealth: Payer: Self-pay | Admitting: Radiology

## 2024-07-25 NOTE — Telephone Encounter (Signed)
 Copied from CRM [id number]. Topic: Clinical - Medication Question >> Jul 25, 2024 11:44 AM Harlene ORN wrote: Reason for CRM: Rosaria GLENWOOD [facility] [employer] Calling to speak to PCP about the patient's Zepbound  Prescription. Requiring Prior Authorization. Phone: [phone]

## 2024-07-26 ENCOUNTER — Telehealth: Payer: Self-pay | Admitting: Radiology

## 2024-07-26 ENCOUNTER — Encounter: Payer: Self-pay | Admitting: Emergency Medicine

## 2024-07-26 NOTE — Telephone Encounter (Signed)
 PA has been sent. And patient has been informed

## 2024-07-26 NOTE — Telephone Encounter (Signed)
 Copied from CRM [id number]. Topic: Clinical - Medication Question >> Jul 25, 2024 11:44 AM Harlene ORN wrote: Reason for CRM: Rosaria GLENWOOD [facility] [employer] Calling to speak to PCP about the patient's Zepbound  Prescription. Requiring Prior Authorization. Phone: [phone] >> Jul 26, 2024  2:12 PM [place] J wrote: Patient is wanting to speak with Pattiann Solanki in regards to the prior authorization for zepbound . Patient said that the CMA guaranteed completion of the prior authorization to be today and she is demanding that Doreatha Offer gets it approved as promised. Patient is frustrated on how long this is taking and said that this process has been ongoing since May.  Please call patient back at [phone].

## 2024-07-26 NOTE — Telephone Encounter (Signed)
 PA sent and patient has been informed it takes some days

## 2024-07-28 ENCOUNTER — Encounter: Payer: Self-pay | Admitting: Hematology and Oncology

## 2024-07-28 LAB — COMPREHENSIVE METABOLIC PANEL WITH GFR
ALT: 12 U/L (ref 0–35)
AST: 15 U/L (ref 0–37)
Albumin: 4.3 g/dL (ref 3.5–5.2)
Alkaline Phosphatase: 49 U/L (ref 39–117)
BUN: 11 mg/dL (ref 6–23)
CO2: 22 meq/L (ref 19–32)
Calcium: 9.2 mg/dL (ref 8.4–10.5)
Chloride: 108 meq/L (ref 96–112)
Creatinine, Ser: 0.74 mg/dL (ref 0.40–1.20)
GFR: 91.76 mL/min (ref >60.00)
Glucose, Bld: 81 mg/dL (ref 70–99)
Potassium: 3.8 meq/L (ref 3.5–5.1)
Sodium: 138 meq/L (ref 135–145)
Total Bilirubin: 0.4 mg/dL (ref 0.2–1.2)
Total Protein: 7.3 g/dL (ref 6.0–8.3)

## 2024-07-31 NOTE — Telephone Encounter (Signed)
 Okay to order sleep apnea test.

## 2024-08-01 ENCOUNTER — Other Ambulatory Visit: Payer: Self-pay | Admitting: Radiology

## 2024-08-01 DIAGNOSIS — R29818 Other symptoms and signs involving the nervous system: Secondary | ICD-10-CM

## 2024-08-13 ENCOUNTER — Encounter: Payer: Self-pay | Admitting: Hematology and Oncology

## 2024-09-10 ENCOUNTER — Other Ambulatory Visit (HOSPITAL_COMMUNITY): Payer: Self-pay

## 2024-09-17 ENCOUNTER — Other Ambulatory Visit (HOSPITAL_COMMUNITY): Payer: Self-pay

## 2024-10-01 ENCOUNTER — Encounter: Payer: Self-pay | Admitting: Hematology and Oncology

## 2024-10-01 ENCOUNTER — Telehealth: Payer: Self-pay | Admitting: Hematology and Oncology

## 2024-10-01 NOTE — Telephone Encounter (Signed)
 Patient had an episode of supraventricular tachycardia with a heart rate going up to 248. She took diltiazem  and after 15 minutes her heart rate came back to normal. This is only happening 1 time before during chemotherapy where her heart rate was up to 180s.  She was seen by cardiology at that time.  Plan: Since this is not happening too frequently, we decided to hold off on sending her back to cardiology for discussion on ablation.  She will inform us  if it happens more often.

## 2024-11-29 ENCOUNTER — Encounter: Payer: Self-pay | Admitting: Hematology and Oncology

## 2024-12-06 ENCOUNTER — Encounter: Payer: Self-pay | Admitting: Emergency Medicine

## 2024-12-06 NOTE — Telephone Encounter (Signed)
 Negative.  Not taking any new patients at this time.

## 2024-12-06 NOTE — Telephone Encounter (Signed)
 Please advise.

## 2024-12-06 NOTE — Telephone Encounter (Signed)
 Copied from CRM #[id number]. Topic: Appointments - Scheduling Inquiry for Clinic >> Dec 06, 2024  2:00 PM Ahlexyia S wrote: Reason for CRM: Pt called in requesting for her son to be seen by Dr. Purcell. Pt stated her son is aging out of pediatrics and is needing to establish care with a different provider. Pt is requesting a callback for next steps.

## 2025-05-13 ENCOUNTER — Ambulatory Visit (HOSPITAL_BASED_OUTPATIENT_CLINIC_OR_DEPARTMENT_OTHER): Payer: Self-pay | Admitting: Obstetrics & Gynecology

## 2025-07-02 ENCOUNTER — Ambulatory Visit: Admitting: Hematology and Oncology
